# Patient Record
Sex: Female | Born: 1950 | Race: Black or African American | Hispanic: No | Marital: Single | Smoking: Never smoker
Health system: Southern US, Community
[De-identification: ages and names within clinical notes are randomized; demographics above are authoritative.]

## PROBLEM LIST (undated history)

## (undated) DIAGNOSIS — C801 Malignant (primary) neoplasm, unspecified: Secondary | ICD-10-CM

## (undated) DIAGNOSIS — I10 Essential (primary) hypertension: Secondary | ICD-10-CM

## (undated) DIAGNOSIS — Z923 Personal history of irradiation: Secondary | ICD-10-CM

## (undated) DIAGNOSIS — L309 Dermatitis, unspecified: Secondary | ICD-10-CM

## (undated) DIAGNOSIS — R627 Adult failure to thrive: Secondary | ICD-10-CM

## (undated) DIAGNOSIS — D500 Iron deficiency anemia secondary to blood loss (chronic): Secondary | ICD-10-CM

## (undated) DIAGNOSIS — R011 Cardiac murmur, unspecified: Secondary | ICD-10-CM

## (undated) DIAGNOSIS — E039 Hypothyroidism, unspecified: Secondary | ICD-10-CM

## (undated) DIAGNOSIS — C3491 Malignant neoplasm of unspecified part of right bronchus or lung: Principal | ICD-10-CM

## (undated) DIAGNOSIS — T8859XA Other complications of anesthesia, initial encounter: Secondary | ICD-10-CM

## (undated) DIAGNOSIS — T4145XA Adverse effect of unspecified anesthetic, initial encounter: Secondary | ICD-10-CM

## (undated) DIAGNOSIS — E119 Type 2 diabetes mellitus without complications: Secondary | ICD-10-CM

## (undated) DIAGNOSIS — Z5111 Encounter for antineoplastic chemotherapy: Secondary | ICD-10-CM

## (undated) HISTORY — DX: Type 2 diabetes mellitus without complications: E11.9

## (undated) HISTORY — DX: Personal history of irradiation: Z92.3

## (undated) HISTORY — PX: TONSILLECTOMY: SUR1361

## (undated) HISTORY — DX: Essential (primary) hypertension: I10

## (undated) HISTORY — DX: Encounter for antineoplastic chemotherapy: Z51.11

## (undated) HISTORY — PX: ABDOMINAL HYSTERECTOMY: SHX81

## (undated) HISTORY — DX: Iron deficiency anemia secondary to blood loss (chronic): D50.0

## (undated) HISTORY — DX: Malignant neoplasm of unspecified part of right bronchus or lung: C34.91

## (undated) HISTORY — DX: Hypothyroidism, unspecified: E03.9

## (undated) HISTORY — DX: Malignant (primary) neoplasm, unspecified: C80.1

---

## 1994-02-26 HISTORY — PX: OOPHORECTOMY: SHX86

## 2015-10-10 ENCOUNTER — Emergency Department (HOSPITAL_BASED_OUTPATIENT_CLINIC_OR_DEPARTMENT_OTHER)
Admission: EM | Admit: 2015-10-10 | Discharge: 2015-10-10 | Disposition: A | Discharge status: Home / Self Care | Payer: Self-pay | Attending: Emergency Medicine | Admitting: Emergency Medicine

## 2015-10-10 ENCOUNTER — Emergency Department (HOSPITAL_BASED_OUTPATIENT_CLINIC_OR_DEPARTMENT_OTHER): Payer: Self-pay

## 2015-10-10 ENCOUNTER — Encounter (HOSPITAL_BASED_OUTPATIENT_CLINIC_OR_DEPARTMENT_OTHER): Payer: Self-pay | Admitting: Emergency Medicine

## 2015-10-10 DIAGNOSIS — J189 Pneumonia, unspecified organism: Secondary | ICD-10-CM

## 2015-10-10 DIAGNOSIS — F17200 Nicotine dependence, unspecified, uncomplicated: Secondary | ICD-10-CM | POA: Insufficient documentation

## 2015-10-10 DIAGNOSIS — R918 Other nonspecific abnormal finding of lung field: Secondary | ICD-10-CM

## 2015-10-10 DIAGNOSIS — Z7982 Long term (current) use of aspirin: Secondary | ICD-10-CM | POA: Insufficient documentation

## 2015-10-10 LAB — CBC WITH DIFFERENTIAL/PLATELET
BASOS ABS: 0 10*3/uL (ref 0.0–0.1)
Basophils Relative: 0 %
EOS PCT: 3 %
Eosinophils Absolute: 0.3 10*3/uL (ref 0.0–0.7)
HEMATOCRIT: 35.9 % — AB (ref 36.0–46.0)
HEMOGLOBIN: 13 g/dL (ref 12.0–15.0)
LYMPHS ABS: 2 10*3/uL (ref 0.7–4.0)
LYMPHS PCT: 21 %
MCH: 29.3 pg (ref 26.0–34.0)
MCHC: 36.2 g/dL — ABNORMAL HIGH (ref 30.0–36.0)
MCV: 81 fL (ref 78.0–100.0)
Monocytes Absolute: 0.6 10*3/uL (ref 0.1–1.0)
Monocytes Relative: 6 %
NEUTROS ABS: 6.7 10*3/uL (ref 1.7–7.7)
Neutrophils Relative %: 70 %
Platelets: 285 10*3/uL (ref 150–400)
RBC: 4.43 MIL/uL (ref 3.87–5.11)
RDW: 13.4 % (ref 11.5–15.5)
WBC: 9.6 10*3/uL (ref 4.0–10.5)

## 2015-10-10 LAB — COMPREHENSIVE METABOLIC PANEL
ALBUMIN: 4.1 g/dL (ref 3.5–5.0)
ALT: 15 U/L (ref 14–54)
ANION GAP: 9 (ref 5–15)
AST: 26 U/L (ref 15–41)
Alkaline Phosphatase: 189 U/L — ABNORMAL HIGH (ref 38–126)
BILIRUBIN TOTAL: 0.6 mg/dL (ref 0.3–1.2)
BUN: 11 mg/dL (ref 6–20)
CHLORIDE: 99 mmol/L — AB (ref 101–111)
CO2: 27 mmol/L (ref 22–32)
Calcium: 9.6 mg/dL (ref 8.9–10.3)
Creatinine, Ser: 1.05 mg/dL — ABNORMAL HIGH (ref 0.44–1.00)
GFR calc Af Amer: >60 mL/min (ref >60)
GFR, EST NON AFRICAN AMERICAN: 55 mL/min — AB (ref >60)
GLUCOSE: 93 mg/dL (ref 65–99)
POTASSIUM: 4.1 mmol/L (ref 3.5–5.1)
Sodium: 135 mmol/L (ref 135–145)
TOTAL PROTEIN: 9.2 g/dL — AB (ref 6.5–8.1)

## 2015-10-10 MED ORDER — BENZONATATE 100 MG PO CAPS: 100.0000 mg | ORAL_CAPSULE | Freq: Three times a day (TID) | ORAL | 0 refills | Status: DC

## 2015-10-10 MED ORDER — SODIUM CHLORIDE 0.9 % IV BOLUS (SEPSIS)
500.0000 mL | Freq: Once | INTRAVENOUS | Status: AC
  Administered 2015-10-10: 500 mL via INTRAVENOUS

## 2015-10-10 MED ORDER — LEVOFLOXACIN IN D5W 500 MG/100ML IV SOLN
500.0000 mg | Freq: Once | INTRAVENOUS | Status: AC
  Administered 2015-10-10: 500 mg via INTRAVENOUS
  Filled 2015-10-10: qty 100

## 2015-10-10 MED ORDER — IOPAMIDOL (ISOVUE-300) INJECTION 61%
100.0000 mL | Freq: Once | INTRAVENOUS | Status: AC | PRN
  Administered 2015-10-10: 80 mL via INTRAVENOUS

## 2015-10-10 MED ORDER — LEVOFLOXACIN 500 MG PO TABS: 500.0000 mg | ORAL_TABLET | Freq: Every day | ORAL | 0 refills | Status: DC

## 2015-10-10 NOTE — ED Notes (Signed)
Patient transported to CT 

## 2015-10-10 NOTE — Discharge Instructions (Signed)
Follow-up with oncology to arrange further testing on not found the lungs. Take antibiotics as prescribed. Stop smoking. Return immediately for worsening shortness of breath, fever, coughing up blood or for any concerns.

## 2015-10-10 NOTE — ED Triage Notes (Signed)
Pt states she has had a cold x 1 week and it's going into her neck and chest.

## 2015-10-10 NOTE — ED Provider Notes (Signed)
[facility] Provider Note   CSN: [id number] Arrival date & time: 10/10/15  1802  By signing my name below, I, Shanna Cisco, attest that this documentation has been prepared under the direction and in the presence of Julianne Rice, MD. Electronically Signed: Shanna Cisco, ED Scribe. 10/10/15. 7:21 PM.    History   Chief Complaint Chief Complaint  Patient presents with  . URI    The history is provided by the patient. No language interpreter was used.   HPI Comments:  Brittany Mercado is a 65 y.o. female who presents to the Emergency Department complaining of gradually worsening symptoms of cold, which started one week ago. Associated symptoms per pt include productive coughOf yellow and green sputum, SOB upon exertion, abdominal, neck and shoulder pain, chills and diaphoresis. Pt hasn't tried taking OTC medication for relief and hasn't seen a PCP. Denies fever. Pt is a tobacco user and started 20 years ago. No new lower extremity swelling or pain.   History reviewed. No pertinent past medical history.  There are no active problems to display for this patient.   Past Surgical History:  Procedure Laterality Date  . ABDOMINAL HYSTERECTOMY      OB History    No data available       Home Medications    Prior to Admission medications   Medication Sig Start Date End Date Taking? Authorizing Provider  aspirin 325 MG tablet Take 325 mg by mouth daily.   Yes Historical Provider, MD  benzonatate (TESSALON) 100 MG capsule Take 1 capsule (100 mg total) by mouth every 8 (eight) hours. 10/10/15   Julianne Rice, MD  levofloxacin (LEVAQUIN) 500 MG tablet Take 1 tablet (500 mg total) by mouth daily. 10/10/15   Julianne Rice, MD    Family History History reviewed. No pertinent family history.  Social History Social History  Substance Use Topics  . Smoking status: Current Every Day Smoker  . Smokeless tobacco: Never Used  . Alcohol use No     Allergies    Penicillins   Review of Systems Review of Systems  Constitutional: Positive for chills, fatigue and fever.  HENT: Positive for congestion. Negative for sinus pressure and sore throat.   Respiratory: Positive for cough and shortness of breath. Negative for chest tightness and wheezing.   Cardiovascular: Negative for chest pain, palpitations and leg swelling.  Gastrointestinal: Negative for abdominal pain, diarrhea, nausea and vomiting.  Genitourinary: Negative for dysuria, flank pain and frequency.  Musculoskeletal: Negative for arthralgias, back pain, myalgias, neck pain and neck stiffness.  Skin: Negative for rash and wound.  Neurological: Negative for dizziness, syncope, weakness, light-headedness, numbness and headaches.  All other systems reviewed and are negative.    Physical Exam Updated Vital Signs BP 127/58 (BP Location: Right Arm)   Pulse 70   Temp 98.2 F (36.8 C) (Oral)   Resp 18   Ht '5\' 3"'$  (1.6 m)   Wt 115 lb (52.2 kg)   SpO2 99%   BMI 20.37 kg/m   Physical Exam  Constitutional: She is oriented to person, place, and time. She appears well-developed and well-nourished.  HENT:  Head: Normocephalic and atraumatic.  Mouth/Throat: Oropharynx is clear and moist.  Eyes: EOM are normal. Pupils are equal, round, and reactive to light.  Neck: Normal range of motion. Neck supple.  Cardiovascular: Normal rate and regular rhythm.  Exam reveals no gallop and no friction rub.   No murmur heard. Pulmonary/Chest: Effort normal. She has rales (Few scattered Rales).  Abdominal: Soft. Bowel sounds are normal. There is no tenderness. There is no rebound and no guarding.  Musculoskeletal: Normal range of motion. She exhibits no edema or tenderness.  No lower extremity swelling, asymmetry or tenderness. Distal pulses are equal and 2+.  Neurological: She is alert and oriented to person, place, and time.  Moves all extremities without deficit. Sensation is fully intact.  Skin: Skin  is warm and dry. No rash noted. No erythema.  Psychiatric: She has a normal mood and affect. Her behavior is normal.  Nursing note and vitals reviewed.    ED Treatments / Results  DIAGNOSTIC STUDIES:  Oxygen Saturation is 100% on room air, normal by my interpretation.    COORDINATION OF CARE:  7:19 PM Discussed treatment plan with pt at bedside, which includes CAT scan of chest, blood work and antibiotics, and pt agreed to plan. Advised to established care with PCP.  Labs (all labs ordered are listed, but only abnormal results are displayed) Labs Reviewed  COMPREHENSIVE METABOLIC PANEL - Abnormal; Notable for the following:       Result Value   Chloride 99 (*)    Creatinine, Ser 1.05 (*)    Total Protein 9.2 (*)    Alkaline Phosphatase 189 (*)    GFR calc non Af Amer 55 (*)    All other components within normal limits  CBC WITH DIFFERENTIAL/PLATELET - Abnormal; Notable for the following:    HCT 35.9 (*)    MCHC 36.2 (*)    All other components within normal limits    EKG  EKG Interpretation None       Radiology Dg Chest 2 View  Result Date: 10/10/2015 CLINICAL DATA:  Chest pain.  Cold and cough for 2 weeks. EXAM: CHEST  2 VIEW COMPARISON:  None. FINDINGS: Normal cardiomediastinal silhouette. Calcified thoracic aorta. Asymmetric opacity RIGHT midlung zone, consistent with early or resolving infiltrate. This abnormality is difficult to visualize on the lateral view, and overlies the central hilar structures most likely. No effusion or pneumothorax. No osseous findings. IMPRESSION: Asymmetric opacity RIGHT midlung zone consistent with early or resolving pneumonia. However, in this patient who is a current everyday smoker, continued surveillance is warranted to exclude a mass. Recommend follow-up radiographs as dictated by clinical improvement until the abnormality clears, no longer than 3-4 weeks. Electronically Signed   By: Staci Righter M.D.   On: 10/10/2015 19:07   Ct  Chest W Contrast  Result Date: 10/10/2015 CLINICAL DATA:  Cough and cold symptoms for 2 weeks. Chest pain and RIGHT-sided back pain. EXAM: CT CHEST WITH CONTRAST TECHNIQUE: Multidetector CT imaging of the chest was performed during intravenous contrast administration. CONTRAST:  77m ISOVUE-300 IOPAMIDOL (ISOVUE-300) INJECTION 61% COMPARISON:  Chest radiograph earlier today. FINDINGS: Cardiovascular: Normal heart size. Three vessel coronary artery calcification. 3 vessel branching pattern from normal sized aortic arch. Unremarkable pulmonary arteries. Mediastinum/Nodes: RIGHT hilar adenopathy, 14 mm lesion as seen on image 67 series 2. Lungs/Pleura: 15 x 24 x 27 mm, superior segment, RIGHT lower lobe mass abuts the posterior pleura, centrally necrotic, slight pleural reaction. No osseous destruction. Neoplasm is likely. 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 Upper Abdomen: Unremarkable Musculoskeletal: No osseous lesions are apparent. IMPRESSION: Superior segment RIGHT lower lobe mass 15 x 24 x 27 mm with adjacent pathologic RIGHT hilar adenopathy. Findings are strongly suspicious for carcinoma of the lung. Tissue sampling is warranted. COPD. Electronically Signed   By: JStaci RighterM.D.   On: 10/10/2015 21:27  Procedures Procedures (including critical care time)  Medications Ordered in ED Medications  sodium chloride 0.9 % bolus 500 mL (0 mLs Intravenous Stopped 10/10/15 2118)  levofloxacin (LEVAQUIN) IVPB 500 mg (0 mg Intravenous Stopped 10/10/15 2118)  iopamidol (ISOVUE-300) 61 % injection 100 mL (80 mLs Intravenous Contrast Given 10/10/15 2036)     Initial Impression / Assessment and Plan / ED Course  I have reviewed the triage vital signs and the nursing notes.  Pertinent labs & imaging results that were available during my care of the patient were reviewed by me and considered in my medical decision making (see chart for details).  Clinical Course    Discuss  CT results with the patient. She understands the gravity of the finding and the need to follow-up with oncology for confirmatory testing. We'll continue antibiotics for possible community acquired pneumonia. She understands she needs to follow-up in the emergency department if she is unable to see the oncologist or her symptoms worsen.  Final Clinical Impressions(s) / ED Diagnoses   Final diagnoses:  CAP (community acquired pneumonia)  Mass of lower lobe of right lung    New Prescriptions New Prescriptions   BENZONATATE (TESSALON) 100 MG CAPSULE    Take 1 capsule (100 mg total) by mouth every 8 (eight) hours.   LEVOFLOXACIN (LEVAQUIN) 500 MG TABLET    Take 1 tablet (500 mg total) by mouth daily.  I personally performed the services described in this documentation, which was scribed in my presence. The recorded information has been reviewed and is accurate.       Julianne Rice, MD 10/10/15 2214

## 2015-10-11 ENCOUNTER — Encounter: Payer: Self-pay | Admitting: *Deleted

## 2015-10-11 ENCOUNTER — Telehealth: Payer: Self-pay | Admitting: *Deleted

## 2015-10-11 DIAGNOSIS — R918 Other nonspecific abnormal finding of lung field: Secondary | ICD-10-CM

## 2015-10-11 NOTE — Progress Notes (Signed)
Oncology Nurse Navigator Documentation  Oncology Nurse Navigator Flowsheets 10/11/2015  Navigator Encounter Type Other/I received a referral on Brittany Mercado today.  I notified new nurse patient coordinator to set up appt with Dr. [name] on 10/19/15 with labs before.  Lab order placed.   Abnormal Finding Date 10/10/2015  Treatment Phase Pre-Tx/Tx Discussion  Barriers/Navigation Needs Coordination of Care  Interventions Coordination of Care  Coordination of Care Appts  Acuity Level 1  Time Spent with Patient 30

## 2015-10-11 NOTE — Telephone Encounter (Signed)
TC to patient. Scheduled appt with lab and MM per Hinton Dyer. Pt aware of appt. Concerned for lack of insurance. Pt would like consult with financial advisor on 8/23 with in clinic.

## 2015-10-19 ENCOUNTER — Ambulatory Visit (HOSPITAL_BASED_OUTPATIENT_CLINIC_OR_DEPARTMENT_OTHER): Payer: Self-pay | Admitting: Internal Medicine

## 2015-10-19 ENCOUNTER — Encounter: Payer: Self-pay | Admitting: Internal Medicine

## 2015-10-19 ENCOUNTER — Other Ambulatory Visit (HOSPITAL_BASED_OUTPATIENT_CLINIC_OR_DEPARTMENT_OTHER): Payer: Self-pay

## 2015-10-19 VITALS — BP 147/57 | HR 83 | Temp 98.0°F | Resp 18 | Ht 63.0 in | Wt 112.9 lb

## 2015-10-19 DIAGNOSIS — R918 Other nonspecific abnormal finding of lung field: Secondary | ICD-10-CM

## 2015-10-19 LAB — COMPREHENSIVE METABOLIC PANEL
ALT: 9 U/L (ref 0–55)
ANION GAP: 9 meq/L (ref 3–11)
AST: 25 U/L (ref 5–34)
Albumin: 3.4 g/dL — ABNORMAL LOW (ref 3.5–5.0)
Alkaline Phosphatase: 198 U/L — ABNORMAL HIGH (ref 40–150)
BUN: 8.2 mg/dL (ref 7.0–26.0)
CHLORIDE: 105 meq/L (ref 98–109)
CO2: 27 meq/L (ref 22–29)
CREATININE: 1.1 mg/dL (ref 0.6–1.1)
Calcium: 9.5 mg/dL (ref 8.4–10.4)
EGFR: 65 mL/min/{1.73_m2} — AB (ref >90)
GLUCOSE: 90 mg/dL (ref 70–140)
Potassium: 3.9 mEq/L (ref 3.5–5.1)
SODIUM: 141 meq/L (ref 136–145)
TOTAL PROTEIN: 8.4 g/dL — AB (ref 6.4–8.3)

## 2015-10-19 LAB — CBC WITH DIFFERENTIAL/PLATELET
BASO%: 0.2 % (ref 0.0–2.0)
Basophils Absolute: 0 10*3/uL (ref 0.0–0.1)
EOS ABS: 0.4 10*3/uL (ref 0.0–0.5)
EOS%: 4.3 % (ref 0.0–7.0)
HEMATOCRIT: 34.4 % — AB (ref 34.8–46.6)
HEMOGLOBIN: 12.2 g/dL (ref 11.6–15.9)
LYMPH#: 2.9 10*3/uL (ref 0.9–3.3)
LYMPH%: 30.8 % (ref 14.0–49.7)
MCH: 28.9 pg (ref 25.1–34.0)
MCHC: 35.5 g/dL (ref 31.5–36.0)
MCV: 81.5 fL (ref 79.5–101.0)
MONO#: 0.6 10*3/uL (ref 0.1–0.9)
MONO%: 5.9 % (ref 0.0–14.0)
NEUT%: 58.8 % (ref 38.4–76.8)
NEUTROS ABS: 5.4 10*3/uL (ref 1.5–6.5)
PLATELETS: 246 10*3/uL (ref 145–400)
RBC: 4.22 10*6/uL (ref 3.70–5.45)
RDW: 13.9 % (ref 11.2–14.5)
WBC: 9.3 10*3/uL (ref 3.9–10.3)

## 2015-10-19 NOTE — Progress Notes (Signed)
[facility] [phone]   [phone]  CONSULT NOTE  REFERRING PHYSICIAN: Dr. Julianne Rice  REASON FOR CONSULTATION:  65 years old African-American female with questionable lung cancer.  HPI Brittany Mercado is a 64 y.o. female with no significant past medical history but has long history of smoking. The patient presented to the emergency department at the [facility] complaining of chest congestion as well as cough in addition to left and right sided chest pain 2 weeks' duration. She had chest x-ray performed on 10/10/2015 and it showed isometric opacity in the right midlung zone consistent with early or resolving pneumonia. This was followed by CT scan of the chest with contrast on the same day. It showed 1.5 x 2.4 x 2.7 cm superior segment right lower lobe mass abuts the posterior pleura, centrally necrotic. There was also right hilar adenopathy measuring 1.4 cm. The patient was referred to me today for further evaluation and recommendation regarding this abnormality. When seen today she is feeling fine with no specific complaints except for mild cough and shortness of breath with exertion but no hemoptysis. She has occasional right-sided chest pain. She denied having any significant weight loss or night sweats. The patient denied having any nausea, vomiting, diarrhea or constipation. She has intermittent headache but no visual changes. Family history significant for mother died from heart disease and Alzheimer. Father had rheumatoid arthritis. No family history of malignancy. The patient is single and has 3 sons. She was accompanied by her sister Vira Agar. She is currently retired and used to work in [employer] (medical). She has a history of smoking less than one pack per day for around 45 years and unfortunately she continues to smoke a few cigarettes every day. She has no history of alcohol or drug abuse.  HPI  History reviewed. No pertinent past medical  history.  Past Surgical History:  Procedure Laterality Date  . ABDOMINAL HYSTERECTOMY      History reviewed. No pertinent family history.  Social History Social History  Substance Use Topics  . Smoking status: Current Every Day Smoker    Packs/day: 0.25    Years: 40.00  . Smokeless tobacco: Never Used  . Alcohol use No    Allergies  Allergen Reactions  . Penicillins Swelling    Current Outpatient Prescriptions  Medication Sig Dispense Refill  . aspirin 325 MG tablet Take 325 mg by mouth daily.    . benzonatate (TESSALON) 100 MG capsule Take 1 capsule (100 mg total) by mouth every 8 (eight) hours. 21 capsule 0  . levofloxacin (LEVAQUIN) 500 MG tablet Take 1 tablet (500 mg total) by mouth daily. 7 tablet 0   No current facility-administered medications for this visit.     Review of Systems  Constitutional: negative Eyes: negative Ears, nose, mouth, throat, and face: negative Respiratory: positive for cough and dyspnea on exertion Cardiovascular: negative Gastrointestinal: negative Genitourinary:negative Integument/breast: negative Hematologic/lymphatic: negative Musculoskeletal:negative Neurological: negative Behavioral/Psych: negative Endocrine: negative Allergic/Immunologic: negative  Physical Exam  XBM:WUXLK, healthy, no distress, well nourished and well developed SKIN: skin color, texture, turgor are normal, no rashes or significant lesions HEAD: Normocephalic, No masses, lesions, tenderness or abnormalities EYES: normal, PERRLA EARS: External ears normal, Canals clear OROPHARYNX:no exudate, no erythema and lips, buccal mucosa, and tongue normal  NECK: supple, no adenopathy, no JVD LYMPH:  no palpable lymphadenopathy, no hepatosplenomegaly BREAST:not examined LUNGS: clear to auscultation , and palpation HEART: regular rate & rhythm, no murmurs and no gallops ABDOMEN:abdomen  soft, non-tender, normal bowel sounds and no masses or organomegaly BACK: Back  symmetric, no curvature., No CVA tenderness EXTREMITIES:no joint deformities, effusion, or inflammation, no edema, no skin discoloration  NEURO: alert & oriented x 3 with fluent speech, no focal motor/sensory deficits  PERFORMANCE STATUS: ECOG 1  LABORATORY DATA: Lab Results  Component Value Date   WBC 9.3 10/19/2015   HGB 12.2 10/19/2015   HCT 34.4 (L) 10/19/2015   MCV 81.5 10/19/2015   PLT 246 10/19/2015      Chemistry      Component Value Date/Time   NA 141 10/19/2015 1337   K 3.9 10/19/2015 1337   CL 99 (L) 10/10/2015 1950   CO2 27 10/19/2015 1337   BUN 8.2 10/19/2015 1337   CREATININE 1.1 10/19/2015 1337      Component Value Date/Time   CALCIUM 9.5 10/19/2015 1337   ALKPHOS 198 (H) 10/19/2015 1337   AST 25 10/19/2015 1337   ALT 9 10/19/2015 1337   BILITOT <0.30 10/19/2015 1337       RADIOGRAPHIC STUDIES: Dg Chest 2 View  Result Date: 10/10/2015 CLINICAL DATA:  Chest pain.  Cold and cough for 2 weeks. EXAM: CHEST  2 VIEW COMPARISON:  None. FINDINGS: Normal cardiomediastinal silhouette. Calcified thoracic aorta. Asymmetric opacity RIGHT midlung zone, consistent with early or resolving infiltrate. This abnormality is difficult to visualize on the lateral view, and overlies the central hilar structures most likely. No effusion or pneumothorax. No osseous findings. IMPRESSION: Asymmetric opacity RIGHT midlung zone consistent with early or resolving pneumonia. However, in this patient who is a current everyday smoker, continued surveillance is warranted to exclude a mass. Recommend follow-up radiographs as dictated by clinical improvement until the abnormality clears, no longer than 3-4 weeks. Electronically Signed   By: Staci Righter M.D.   On: 10/10/2015 19:07   Ct Chest W Contrast  Result Date: 10/10/2015 CLINICAL DATA:  Cough and cold symptoms for 2 weeks. Chest pain and RIGHT-sided back pain. EXAM: CT CHEST WITH CONTRAST TECHNIQUE: Multidetector CT imaging of the chest  was performed during intravenous contrast administration. CONTRAST:  34m ISOVUE-300 IOPAMIDOL (ISOVUE-300) INJECTION 61% COMPARISON:  Chest radiograph earlier today. FINDINGS: Cardiovascular: Normal heart size. Three vessel coronary artery calcification. 3 vessel branching pattern from normal sized aortic arch. Unremarkable pulmonary arteries. Mediastinum/Nodes: RIGHT hilar adenopathy, 14 mm lesion as seen on image 67 series 2. Lungs/Pleura: 15 x 24 x 27 mm, superior segment, RIGHT lower lobe mass abuts the posterior pleura, centrally necrotic, slight pleural reaction. No osseous destruction. Neoplasm is likely. 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 Upper Abdomen: Unremarkable Musculoskeletal: No osseous lesions are apparent. IMPRESSION: Superior segment RIGHT lower lobe mass 15 x 24 x 27 mm with adjacent pathologic RIGHT hilar adenopathy. Findings are strongly suspicious for carcinoma of the lung. Tissue sampling is warranted. COPD. Electronically Signed   By: JStaci RighterM.D.   On: 10/10/2015 21:27    ASSESSMENT: This is a very pleasant 65years old African-American female was suspicious stage IIA (T1b, N1, M0) non-small cell lung cancer pending staging workup and tissue diagnosis. It presented with right lower lobe lung nodule in addition to right hilar adenopathy.   PLAN: I had a lengthy discussion with the patient and her sister about her current disease condition and further investigation to confirm her diagnosis. I recommended for the patient to have a PET scan in addition to MRI of the brain to complete the staging workup of her disease. I will also  arrange for the patient to come back for follow-up visit in 2 week to the multidisciplinary thoracic oncology clinic for evaluation by thoracic surgery for consideration of tissue diagnosis or resection if the patient is a good surgical candidate. For smoke cessation, I strongly encouraged the patient to quit smoking and  offered her smoke cessation program. The patient was advised to call immediately if she has any concerning symptoms in the interval. The patient voices understanding of current disease status and treatment options and is in agreement with the current care plan.  All questions were answered. The patient knows to call the clinic with any problems, questions or concerns. We can certainly see the patient much sooner if necessary.  Thank you so much for allowing me to participate in the care of [facility]. I will continue to follow up the patient with you and assist in her care.  I spent 40 minutes counseling the patient face to face. The total time spent in the appointment was 60 minutes.  Disclaimer: This note was dictated with voice recognition software. Similar sounding words can inadvertently be transcribed and may not be corrected upon review.   Natarsha Hurwitz K. October 19, 2015, 2:45 PM

## 2015-10-22 ENCOUNTER — Telehealth: Payer: Self-pay | Admitting: Internal Medicine

## 2015-10-22 NOTE — Telephone Encounter (Signed)
Central radiology will call re scans. Message to Brittany Mercado re f/u in [place] 2 weeks per 8/23 los.

## 2015-10-27 ENCOUNTER — Ambulatory Visit (HOSPITAL_COMMUNITY)
Admission: RE | Admit: 2015-10-27 | Discharge: 2015-10-27 | Disposition: A | Discharge status: Home / Self Care | Payer: Self-pay | Source: Ambulatory Visit | Attending: Internal Medicine | Admitting: Internal Medicine

## 2015-10-27 ENCOUNTER — Ambulatory Visit (HOSPITAL_COMMUNITY): Payer: Self-pay

## 2015-10-27 ENCOUNTER — Encounter (HOSPITAL_COMMUNITY): Payer: Self-pay

## 2015-10-27 DIAGNOSIS — R918 Other nonspecific abnormal finding of lung field: Secondary | ICD-10-CM | POA: Insufficient documentation

## 2015-10-27 MED ORDER — GADOBENATE DIMEGLUMINE 529 MG/ML IV SOLN
10.0000 mL | Freq: Once | INTRAVENOUS | Status: AC | PRN
  Administered 2015-10-27: 10 mL via INTRAVENOUS

## 2015-11-02 ENCOUNTER — Telehealth: Payer: Self-pay | Admitting: *Deleted

## 2015-11-02 DIAGNOSIS — R918 Other nonspecific abnormal finding of lung field: Secondary | ICD-10-CM

## 2015-11-02 NOTE — Telephone Encounter (Signed)
Oncology Nurse Navigator Documentation  Oncology Nurse Navigator Flowsheets 11/02/2015  Navigator Encounter Type Telephone/I called to follow up with Brittany Mercado.  I updated her on appt for [place] on 11/10/15 arrive at 1:30.  She verbalized understanding of appt time and place.   Telephone Outgoing Call  Treatment Phase Pre-Tx/Tx Discussion  Barriers/Navigation Needs Coordination of Care  Interventions Coordination of Care  Coordination of Care Appts  Acuity Level 2  Acuity Level 2 Assistance expediting appointments  Time Spent with Patient 30

## 2015-11-04 ENCOUNTER — Telehealth: Payer: Self-pay | Admitting: *Deleted

## 2015-11-04 NOTE — Telephone Encounter (Signed)
Mailed clinic letter to pt.  

## 2015-11-07 ENCOUNTER — Encounter (HOSPITAL_COMMUNITY)
Admission: RE | Admit: 2015-11-07 | Discharge: 2015-11-07 | Disposition: A | Discharge status: Home / Self Care | Payer: Self-pay | Source: Ambulatory Visit | Attending: Internal Medicine | Admitting: Internal Medicine

## 2015-11-07 DIAGNOSIS — R918 Other nonspecific abnormal finding of lung field: Secondary | ICD-10-CM | POA: Insufficient documentation

## 2015-11-07 LAB — GLUCOSE, CAPILLARY: GLUCOSE-CAPILLARY: 89 mg/dL (ref 65–99)

## 2015-11-07 MED ORDER — FLUDEOXYGLUCOSE F - 18 (FDG) INJECTION
5.5000 | Freq: Once | INTRAVENOUS | Status: AC | PRN
  Administered 2015-11-07: 5.5 via INTRAVENOUS

## 2015-11-10 ENCOUNTER — Ambulatory Visit
Admission: RE | Admit: 2015-11-10 | Discharge: 2015-11-10 | Disposition: A | Discharge status: Home / Self Care | Payer: Self-pay | Source: Ambulatory Visit | Attending: Radiation Oncology | Admitting: Radiation Oncology

## 2015-11-10 ENCOUNTER — Other Ambulatory Visit: Payer: Self-pay | Admitting: Thoracic Surgery (Cardiothoracic Vascular Surgery)

## 2015-11-10 ENCOUNTER — Institutional Professional Consult (permissible substitution) (INDEPENDENT_AMBULATORY_CARE_PROVIDER_SITE_OTHER): Payer: Self-pay | Admitting: Thoracic Surgery (Cardiothoracic Vascular Surgery)

## 2015-11-10 ENCOUNTER — Other Ambulatory Visit: Payer: Self-pay | Admitting: *Deleted

## 2015-11-10 ENCOUNTER — Encounter: Payer: Self-pay | Admitting: Internal Medicine

## 2015-11-10 ENCOUNTER — Encounter: Payer: Self-pay | Admitting: Thoracic Surgery (Cardiothoracic Vascular Surgery)

## 2015-11-10 ENCOUNTER — Encounter: Payer: Self-pay | Admitting: *Deleted

## 2015-11-10 ENCOUNTER — Other Ambulatory Visit (HOSPITAL_BASED_OUTPATIENT_CLINIC_OR_DEPARTMENT_OTHER): Payer: Self-pay

## 2015-11-10 ENCOUNTER — Ambulatory Visit (HOSPITAL_BASED_OUTPATIENT_CLINIC_OR_DEPARTMENT_OTHER): Payer: Self-pay | Admitting: Internal Medicine

## 2015-11-10 ENCOUNTER — Ambulatory Visit: Payer: Self-pay | Attending: Internal Medicine | Admitting: Physical Therapy

## 2015-11-10 DIAGNOSIS — R2681 Unsteadiness on feet: Secondary | ICD-10-CM | POA: Insufficient documentation

## 2015-11-10 DIAGNOSIS — R918 Other nonspecific abnormal finding of lung field: Secondary | ICD-10-CM

## 2015-11-10 DIAGNOSIS — Z720 Tobacco use: Secondary | ICD-10-CM | POA: Insufficient documentation

## 2015-11-10 LAB — CBC WITH DIFFERENTIAL/PLATELET
BASO%: 0.1 % (ref 0.0–2.0)
BASOS ABS: 0 10*3/uL (ref 0.0–0.1)
EOS ABS: 0.4 10*3/uL (ref 0.0–0.5)
EOS%: 5.2 % (ref 0.0–7.0)
HEMATOCRIT: 35.3 % (ref 34.8–46.6)
HGB: 12.4 g/dL (ref 11.6–15.9)
LYMPH#: 2 10*3/uL (ref 0.9–3.3)
LYMPH%: 27.1 % (ref 14.0–49.7)
MCH: 28.7 pg (ref 25.1–34.0)
MCHC: 35.1 g/dL (ref 31.5–36.0)
MCV: 81.7 fL (ref 79.5–101.0)
MONO#: 0.6 10*3/uL (ref 0.1–0.9)
MONO%: 7.6 % (ref 0.0–14.0)
NEUT#: 4.5 10*3/uL (ref 1.5–6.5)
NEUT%: 60 % (ref 38.4–76.8)
PLATELETS: 226 10*3/uL (ref 145–400)
RBC: 4.32 10*6/uL (ref 3.70–5.45)
RDW: 13.5 % (ref 11.2–14.5)
WBC: 7.5 10*3/uL (ref 3.9–10.3)

## 2015-11-10 LAB — COMPREHENSIVE METABOLIC PANEL
ALT: 10 U/L (ref 0–55)
ANION GAP: 8 meq/L (ref 3–11)
AST: 22 U/L (ref 5–34)
Albumin: 3.4 g/dL — ABNORMAL LOW (ref 3.5–5.0)
Alkaline Phosphatase: 204 U/L — ABNORMAL HIGH (ref 40–150)
BUN: 9.6 mg/dL (ref 7.0–26.0)
CALCIUM: 9.4 mg/dL (ref 8.4–10.4)
CHLORIDE: 104 meq/L (ref 98–109)
CO2: 27 meq/L (ref 22–29)
CREATININE: 1.2 mg/dL — AB (ref 0.6–1.1)
EGFR: 54 mL/min/{1.73_m2} — AB (ref >90)
Glucose: 84 mg/dl (ref 70–140)
Potassium: 4.3 mEq/L (ref 3.5–5.1)
Sodium: 139 mEq/L (ref 136–145)
Total Bilirubin: 0.33 mg/dL (ref 0.20–1.20)
Total Protein: 8.5 g/dL — ABNORMAL HIGH (ref 6.4–8.3)

## 2015-11-10 NOTE — Progress Notes (Signed)
[place] Clinical Social Work  Clinical Social Work met with patient/family at [employer] appointment to offer support and assess for psychosocial needs.  The patient was accompanied by her sister, Brittany Mercado.  [name] shared she was very anxious regarding her diagnosis and her main concern at this time was how her family is coping with her cancer diagnosis.  The patient lives in [place].  Her three children live in the area, she has many grandchildren and a large family support network.  The patient is retired and shared she has difficulty paying her bills on a limited income.  CSW encouraged patient to meet with financial counselor and to follow up with CSW once she has confirmed diagnosis.  ONCBCN DISTRESS SCREENING 11/10/2015  Screening Type Initial Screening  Distress experienced in past week (1-10) 10  Practical problem type Housing;Insurance  Family Problem type Children;Other (comment)  Emotional problem type Nervousness/Anxiety;Adjusting to illness  Information Concerns Type Lack of info about diagnosis;Lack of info about treatment  Referral to clinical social work Yes      Clinical Social Work briefly discussed Clinical Social Work role and [employer] support programs/services.  Clinical Social Work encouraged patient to call with any additional questions or concerns.   [name], MSW, LCSW, OSW-C Clinical Social Worker [facility] [phone]

## 2015-11-10 NOTE — Patient Instructions (Signed)
Smoking Cessation, Tips for Success If you are ready to quit smoking, congratulations! You have chosen to help yourself be healthier. Cigarettes bring nicotine, tar, carbon monoxide, and other irritants into your body. Your lungs, heart, and blood vessels will be able to work better without these poisons. There are many different ways to quit smoking. Nicotine gum, nicotine patches, a nicotine inhaler, or nicotine nasal spray can help with physical craving. Hypnosis, support groups, and medicines help break the habit of smoking. WHAT THINGS CAN I DO TO MAKE QUITTING EASIER?  Here are some tips to help you quit for good:  Pick a date when you will quit smoking completely. Tell all of your friends and family about your plan to quit on that date.  Do not try to slowly cut down on the number of cigarettes you are smoking. Pick a quit date and quit smoking completely starting on that day.  Throw away all cigarettes.   Clean and remove all ashtrays from your home, work, and car.  On a card, write down your reasons for quitting. Carry the card with you and read it when you get the urge to smoke.  Cleanse your body of nicotine. Drink enough water and fluids to keep your urine clear or pale yellow. Do this after quitting to flush the nicotine from your body.  Learn to predict your moods. Do not let a bad situation be your excuse to have a cigarette. Some situations in your life might tempt you into wanting a cigarette.  Never have "just one" cigarette. It leads to wanting another and another. Remind yourself of your decision to quit.  Change habits associated with smoking. If you smoked while driving or when feeling stressed, try other activities to replace smoking. Stand up when drinking your coffee. Brush your teeth after eating. Sit in a different chair when you read the paper. Avoid alcohol while trying to quit, and try to drink fewer caffeinated beverages. Alcohol and caffeine may urge you to  smoke.  Avoid foods and drinks that can trigger a desire to smoke, such as sugary or spicy foods and alcohol.  Ask people who smoke not to smoke around you.  Have something planned to do right after eating or having a cup of coffee. For example, plan to take a walk or exercise.  Try a relaxation exercise to calm you down and decrease your stress. Remember, you may be tense and nervous for the first 2 weeks after you quit, but this will pass.  Find new activities to keep your hands busy. Play with a pen, coin, or rubber band. Doodle or draw things on paper.  Brush your teeth right after eating. This will help cut down on the craving for the taste of tobacco after meals. You can also try mouthwash.   Use oral substitutes in place of cigarettes. Try using lemon drops, carrots, cinnamon sticks, or chewing gum. Keep them handy so they are available when you have the urge to smoke.  When you have the urge to smoke, try deep breathing.  Designate your home as a nonsmoking area.  If you are a heavy smoker, ask your health care provider about a prescription for nicotine chewing gum. It can ease your withdrawal from nicotine.  Reward yourself. Set aside the cigarette money you save and buy yourself something nice.  Look for support from others. Join a support group or smoking cessation program. Ask someone at home or at work to help you with your plan   to quit smoking.  Always ask yourself, "Do I need this cigarette or is this just a reflex?" Tell yourself, "Today, I choose not to smoke," or "I do not want to smoke." You are reminding yourself of your decision to quit.  Do not replace cigarette smoking with electronic cigarettes (commonly called e-cigarettes). The safety of e-cigarettes is unknown, and some may contain harmful chemicals.  If you relapse, do not give up! Plan ahead and think about what you will do the next time you get the urge to smoke. HOW WILL I FEEL WHEN I QUIT SMOKING? You  may have symptoms of withdrawal because your body is used to nicotine (the addictive substance in cigarettes). You may crave cigarettes, be irritable, feel very hungry, cough often, get headaches, or have difficulty concentrating. The withdrawal symptoms are only temporary. They are strongest when you first quit but will go away within 10-14 days. When withdrawal symptoms occur, stay in control. Think about your reasons for quitting. Remind yourself that these are signs that your body is healing and getting used to being without cigarettes. Remember that withdrawal symptoms are easier to treat than the major diseases that smoking can cause.  Even after the withdrawal is over, expect periodic urges to smoke. However, these cravings are generally short lived and will go away whether you smoke or not. Do not smoke! WHAT RESOURCES ARE AVAILABLE TO HELP ME QUIT SMOKING? Your health care provider can direct you to community resources or hospitals for support, which may include:  Group support.  Education.  Hypnosis.  Therapy.   This information is not intended to replace advice given to you by your health care provider. Make sure you discuss any questions you have with your health care provider.   Document Released: 11/11/2003 Document Revised: 03/05/2014 Document Reviewed: 07/31/2012 Elsevier Interactive Patient Education 2016 [employer]  

## 2015-11-10 NOTE — Progress Notes (Signed)
[place] [phone]   [phone] Multidisciplinary thoracic oncology clinic  OFFICE PROGRESS NOTE  No primary care provider on file. No primary provider on file.  DIAGNOSIS: Questionable metastatic lung cancer pending tissue diagnosis and staging workup presented with hypermetabolic right lower lobe nodule with metastatic right hilar lymphadenopathy and potentially subcarinal lymphadenopathy as well as suspicious single hypermetabolic focus in the right acetabular region.  PRIOR THERAPY: None  CURRENT THERAPY: None.  INTERVAL HISTORY: Brittany Mercado 65 y.o. female returns to the clinic today for accompanied by her sister and son. The patient is feeling fine today except for anxiety about her recent imaging studies. She denied having any significant chest pain but has shortness breath with exertion with no cough or hemoptysis. She has no nausea, vomiting, diarrhea or constipation. She denied having any significant weight loss or night sweats. She has no fever or chills. She recently underwent a PET scan as well as MRI of the brain and she is here for evaluation and discussion of her scan results.  MEDICAL HISTORY:No past medical history on file.  ALLERGIES:  is allergic to penicillins.  MEDICATIONS:  Current Outpatient Prescriptions  Medication Sig Dispense Refill  . aspirin 325 MG tablet Take 325 mg by mouth daily.    . benzonatate (TESSALON) 100 MG capsule Take 1 capsule (100 mg total) by mouth every 8 (eight) hours. 21 capsule 0  . levofloxacin (LEVAQUIN) 500 MG tablet Take 1 tablet (500 mg total) by mouth daily. 7 tablet 0   No current facility-administered medications for this visit.     SURGICAL HISTORY:  Past Surgical History:  Procedure Laterality Date  . ABDOMINAL HYSTERECTOMY      REVIEW OF SYSTEMS:  Constitutional: positive for fatigue Eyes: negative Ears, nose, mouth, throat, and face: negative Respiratory: positive for  dyspnea on exertion Cardiovascular: negative Gastrointestinal: negative Genitourinary:negative Integument/breast: negative Hematologic/lymphatic: negative Musculoskeletal:negative Neurological: negative Behavioral/Psych: negative Endocrine: negative Allergic/Immunologic: negative   PHYSICAL EXAMINATION: General appearance: alert, cooperative, fatigued and no distress Head: Normocephalic, without obvious abnormality, atraumatic Neck: no adenopathy, no carotid bruit, supple, symmetrical, trachea midline and thyroid not enlarged, symmetric, no tenderness/mass/nodules Lymph nodes: Cervical, supraclavicular, and axillary nodes normal. Resp: clear to auscultation bilaterally Back: symmetric, no curvature. ROM normal. No CVA tenderness. Cardio: regular rate and rhythm, S1, S2 normal, no murmur, click, rub or gallop GI: soft, non-tender; bowel sounds normal; no masses,  no organomegaly Extremities: extremities normal, atraumatic, no cyanosis or edema Neurologic: Alert and oriented X 3, normal strength and tone. Normal symmetric reflexes. Normal coordination and gait  ECOG PERFORMANCE STATUS: 1 - Symptomatic but completely ambulatory  There were no vitals taken for this visit.  LABORATORY DATA: Lab Results  Component Value Date   WBC 7.5 11/10/2015   HGB 12.4 11/10/2015   HCT 35.3 11/10/2015   MCV 81.7 11/10/2015   PLT 226 11/10/2015      Chemistry      Component Value Date/Time   NA 141 10/19/2015 1337   K 3.9 10/19/2015 1337   CL 99 (L) 10/10/2015 1950   CO2 27 10/19/2015 1337   BUN 8.2 10/19/2015 1337   CREATININE 1.1 10/19/2015 1337      Component Value Date/Time   CALCIUM 9.5 10/19/2015 1337   ALKPHOS 198 (H) 10/19/2015 1337   AST 25 10/19/2015 1337   ALT 9 10/19/2015 1337   BILITOT <0.30 10/19/2015 1337       RADIOGRAPHIC STUDIES: Mr Jeri Cos EN  Contrast  Result Date: 10/27/2015 CLINICAL DATA:  65 year old female with superior segment right lower lobe mass and  hilar lymphadenopathy. Staging for presumed lung cancer. Subsequent encounter. EXAM: MRI HEAD WITHOUT AND WITH CONTRAST TECHNIQUE: Multiplanar, multiecho pulse sequences of the brain and surrounding structures were obtained without and with intravenous contrast. CONTRAST:  61m MULTIHANCE GADOBENATE DIMEGLUMINE 529 MG/ML IV SOLN COMPARISON:  CT chest 10/10/2015. FINDINGS: No abnormal enhancement identified. No midline shift, mass effect, or evidence of intracranial mass lesion. No dural thickening. Visualized bone marrow signal is within normal limits. Negative visualized cervical spine and spinal cord. Major intracranial vascular flow voids are within normal limits. No restricted diffusion to suggest acute infarction. Noventriculomegaly, extra-axial collection or acute intracranial hemorrhage. Cervicomedullary junction and pituitary are within normal limits. Scattered small mostly subcortical cerebral white matter foci of T2 and FLAIR hyperintensity, nonspecific. Extent is moderate for age. No cortical encephalomalacia or chronic cerebral blood products. Visible internal auditory structures appear normal. Paranasal sinuses and mastoids are well pneumatized. Mild frontal sinus mucosal thickening. Negative orbit and scalp soft tissues. IMPRESSION: 1.  No metastatic disease or acute intracranial abnormality. 2. Moderate for age nonspecific cerebral white matter signal changes, most commonly due to chronic small vessel disease. Electronically Signed   By: HGenevie AnnM.D.   On: 10/27/2015 14:57   Nm Pet Image Initial (pi) Skull Base To Thigh  Result Date: 11/07/2015 CLINICAL DATA:  Initial treatment strategy for lung mass. EXAM: NUCLEAR MEDICINE PET SKULL BASE TO THIGH TECHNIQUE: 5.5 mCi F-18 FDG was injected intravenously. Full-ring PET imaging was performed from the skull base to thigh after the radiotracer. CT data was obtained and used for attenuation correction and anatomic localization. FASTING BLOOD GLUCOSE:   Value: 89 mg/dl COMPARISON:  CT scan from 10/10/2015. FINDINGS: NECK No hypermetabolic lymph nodes in the neck. CHEST Right lower lobe lesion, measuring 2.7 x 2.4 cm, is markedly hypermetabolic with SUV max = 16. There is hypermetabolic right hilar lymphadenopathy associated with SUV max = 24. 8 mm short axis subcarinal lymph node is not show marked hypermetabolism but has low level FDG uptake take, just above background mediastinal levels with SUV max = 4.1 Moderate to advanced changes of emphysema are noted bilaterally. ABDOMEN/PELVIS No abnormal hypermetabolic activity within the liver, pancreas, adrenal glands, or spleen. No hypermetabolic lymph nodes in the abdomen or pelvis. No evidence for hypermetabolic lymphadenopathy in the abdomen or pelvis. Of particular note, a 1.7 x 2.0 cm polypoid soft tissue lesion in the distal sigmoid colon (see image 147 series 4) is hypermetabolic with SUV max = 15. There is abdominal aortic atherosclerosis without aneurysm. SKELETON A single suspicious hypermetabolic focus is identified within the bony anatomy. This is in the posterior right acetabulum in corresponds to image 161 of series 4. There is no underlying CT abnormality at this level. SUV max = is 5.5. IMPRESSION: 1. Hypermetabolic right lower lobe nodule consistent with malignancy. This is associated with metastatic right hilar lymphadenopathy and potentially metastatic spread to a subcarinal lymph node, although the subcarinal disease is not considered definite. 2. 2.0 cm polypoid lesion in the sigmoid colon is markedly hypermetabolic. Colonic adenoma or adenocarcinoma could have this appearance. Correlation with colorectal cancer screening history recommended and endoscopic evaluation may be indicated. 3. There is a single hypermetabolic focus identified in the bony anatomy, corresponding to the posterior right acetabular region. No underlying abnormality evident on CT. This could potentially be degenerative, but  metastatic involvement is a concern. MRI of the  right hip without and with contrast may prove helpful to further evaluate. 4. Emphysema. 5. Abdominal aortic atherosclerosis. Electronically Signed   By: Misty Stanley M.D.   On: 11/07/2015 12:51    ASSESSMENT AND PLAN: This is a very pleasant 65 years old African-American female with highly suspicious lung cancer that could range from stage IIa with right lower lobe lung nodule and hilar lymphadenopathy up to stage IV if the lesion in the right scapular bone is positive for malignancy. The patient also has questionable lesion in the sigmoid colon concerning for a polyp or colon cancer. MRI of the brain is unremarkable for metastatic brain lesion. I had a lengthy discussion with the patient and her family about her current disease status. I showed them the images of the PET scan. I recommended for the patient to consider MRI of the pelvis for further evaluation of the right acetabular lesion and to rule out metastatic disease to this area. I will also refer the patient to gastroenterology for evaluation of the sigmoid colon hypermetabolic lesion. In the meantime I also recommended for the patient to see Dr. Roxan Hockey later today at the multidisciplinary thoracic oncology clinic for consideration of bronchoscopy, endobronchial ultrasound plus/minus electromagnetic navigational bronchoscopy for tissue diagnosis. Depending on the final staging workup further treatment recommendation will be given for this patient. I would see her back for follow-up visit in 2 weeks for discussion of her treatment options. The patient was seen during the multidisciplinary thoracic oncology clinic today by medical oncology, thoracic surgery, thoracic navigator, social worker and physical therapist. She was advised to call immediately if she has any concerning symptoms in the interval.  The patient voices understanding of current disease status and treatment options and is in  agreement with the current care plan.  All questions were answered. The patient knows to call the clinic with any problems, questions or concerns. We can certainly see the patient much sooner if necessary.  I spent 20 minutes counseling the patient face to face. The total time spent in the appointment was 30 minutes.  Disclaimer: This note was dictated with voice recognition software. Similar sounding words can inadvertently be transcribed and may not be corrected upon review.

## 2015-11-10 NOTE — Progress Notes (Signed)
PCP is No primary care provider on file. Referring Provider is Curt Bears, MD  No chief complaint on file.   HPI: 65 yo woman with a history of tobacco abuse who recently presented with productive cough, fever, chest tightness. In ED CT showed a right lung mass. CT showed a RLL mass abutting the chest wall. Also showed upper lobe predominant emphysema PET showed the mass and hilar nodes were hypermetabolic. There was a questionable subcarinal node. There was activity in the sigmoid colon and right hip.  She is smoking < 1/2 ppd. She denies wheezing. Cough resolved with antibiotics. Has to stop going up one flight of 14 stairs. No weight loss but appetite poor. No HA or visual changes. Denies hip pain.  Zubrod Score: At the time of surgery this patient's most appropriate activity status/level should be described as: _0     0    Normal activity, no symptoms _1     1    Restricted in physical strenuous activity but ambulatory, able to do out light work _2     2    Ambulatory and capable of self care, unable to do work activities, up and about >50 % of waking hours                              _3     3    Only limited self care, in bed greater than 50% of waking hours _4     4    Completely disabled, no self care, confined to bed or chair _5     5    Moribund    Past Medical History:  Diagnosis Date  . COPD (chronic obstructive pulmonary disease) ([place])   . Lung mass   Denies cardiac, renal, GI, hepatic issues, DVT/ PE  Past Surgical History:  Procedure Laterality Date  . ABDOMINAL HYSTERECTOMY    . OOPHORECTOMY  1996   "benign"  . TONSILLECTOMY      Family History  Problem Relation Age of Onset  . Family history unknown: Yes    Social History Social History  Substance Use Topics  . Smoking status: Current Every Day Smoker    Packs/day: 0.75    Years: 40.00  . Smokeless tobacco: Never Used  . Alcohol use No    Current Outpatient Prescriptions  Medication Sig Dispense  Refill  . aspirin 325 MG tablet Take 325 mg by mouth daily.    . Multiple Vitamin (MULTIVITAMIN) tablet Take 1 tablet by mouth daily.     No current facility-administered medications for this visit.     Allergies  Allergen Reactions  . Penicillins Swelling    Review of Systems  Constitutional: Positive for appetite change and fatigue. Negative for fever and unexpected weight change.  HENT: Negative for trouble swallowing and voice change.   Respiratory: Positive for cough and shortness of breath.   Cardiovascular: Negative for chest pain, palpitations and leg swelling.  Gastrointestinal: Negative for abdominal distention, abdominal pain, blood in stool and constipation.  Genitourinary: Negative for difficulty urinating and dysuria.  Musculoskeletal: Negative for arthralgias and joint swelling.  Neurological: Negative for seizures, syncope and weakness.  Hematological: Negative for adenopathy. Does not bruise/bleed easily.  All other systems reviewed and are negative.   There were no vitals taken for this visit. Physical Exam  Constitutional: She is oriented to person, place, and time. She appears well-developed and well-nourished. No distress.  HENT:  Head: Normocephalic and atraumatic.  Mouth/Throat: No oropharyngeal exudate.  Eyes: Conjunctivae and EOM are normal. No scleral icterus.  Neck: Neck supple. No thyromegaly present.  Cardiovascular: Normal rate, regular rhythm, normal heart sounds and intact distal pulses.   No murmur heard. Pulmonary/Chest: Effort normal and breath sounds normal. No respiratory distress. She has no wheezes.  Abdominal: Soft. She exhibits no distension. There is no tenderness.  Musculoskeletal: Normal range of motion. She exhibits no edema.  Lymphadenopathy:    She has no cervical adenopathy.  Neurological: She is alert and oriented to person, place, and time. No cranial nerve deficit. She exhibits normal muscle tone. Coordination normal.  Skin:  Skin is warm and dry.     Diagnostic Tests: NUCLEAR MEDICINE PET SKULL BASE TO THIGH  TECHNIQUE: 5.5 mCi F-18 FDG was injected intravenously. Full-ring PET imaging was performed from the skull base to thigh after the radiotracer. CT data was obtained and used for attenuation correction and anatomic localization.  FASTING BLOOD GLUCOSE:  Value: 89 mg/dl  COMPARISON:  CT scan from 10/10/2015.  FINDINGS: NECK  No hypermetabolic lymph nodes in the neck.  CHEST  Right lower lobe lesion, measuring 2.7 x 2.4 cm, is markedly hypermetabolic with SUV max = 16.  There is hypermetabolic right hilar lymphadenopathy associated with SUV max = 24. 8 mm short axis subcarinal lymph node is not show marked hypermetabolism but has low level FDG uptake take, just above background mediastinal levels with SUV max = 4.1  Moderate to advanced changes of emphysema are noted bilaterally.  ABDOMEN/PELVIS  No abnormal hypermetabolic activity within the liver, pancreas, adrenal glands, or spleen. No hypermetabolic lymph nodes in the abdomen or pelvis.  No evidence for hypermetabolic lymphadenopathy in the abdomen or pelvis.  Of particular note, a 1.7 x 2.0 cm polypoid soft tissue lesion in the distal sigmoid colon (see image 147 series 4) is hypermetabolic with SUV max = 15.  There is abdominal aortic atherosclerosis without aneurysm.  SKELETON  A single suspicious hypermetabolic focus is identified within the bony anatomy. This is in the posterior right acetabulum in corresponds to image 161 of series 4. There is no underlying CT abnormality at this level. SUV max = is 5.5.  IMPRESSION: 1. Hypermetabolic right lower lobe nodule consistent with malignancy. This is associated with metastatic right hilar lymphadenopathy and potentially metastatic spread to a subcarinal lymph node, although the subcarinal disease is not considered definite. 2. 2.0 cm polypoid lesion in  the sigmoid colon is markedly hypermetabolic. Colonic adenoma or adenocarcinoma could have this appearance. Correlation with colorectal cancer screening history recommended and endoscopic evaluation may be indicated. 3. There is a single hypermetabolic focus identified in the bony anatomy, corresponding to the posterior right acetabular region. No underlying abnormality evident on CT. This could potentially be degenerative, but metastatic involvement is a concern. MRI of the right hip without and with contrast may prove helpful to further evaluate. 4. Emphysema. 5. Abdominal aortic atherosclerosis.   Electronically Signed   By: Eric  Mansell M.D.   On: 11/07/2015 12:51 CT CHEST WITH CONTRAST  TECHNIQUE: Multidetector CT imaging of the chest was performed during intravenous contrast administration.  CONTRAST:  80mL ISOVUE-300 IOPAMIDOL (ISOVUE-300) INJECTION 61%  COMPARISON:  Chest radiograph earlier today.  FINDINGS: Cardiovascular: Normal heart size. Three vessel coronary artery calcification. 3 vessel branching pattern from normal sized aortic arch. Unremarkable pulmonary arteries.  Mediastinum/Nodes: RIGHT hilar adenopathy, 14 mm lesion as seen on image 67 series 2.  Lungs/Pleura: 15 x 24 x 27 mm,   superior segment, RIGHT lower lobe mass abuts the posterior pleura, centrally necrotic, slight pleural reaction. No osseous destruction. Neoplasm is likely. 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  Upper Abdomen: Unremarkable  Musculoskeletal: No osseous lesions are apparent.  IMPRESSION: Superior segment RIGHT lower lobe mass 15 x 24 x 27 mm with adjacent pathologic RIGHT hilar adenopathy. Findings are strongly suspicious for carcinoma of the lung. Tissue sampling is warranted.  COPD.   Electronically Signed   By: Staci Righter M.D.   On: 10/10/2015 21:27 I personally reviewed the PET and CT and concur with the findings noted  above  Impression: 65 yo woman with a RLL mass and hilar adenopathy. This is almost certainly non-small cell carcinoma. She needs a tissue diagnosis and staging. She has at least stage II disease but possibly stage III if CW involvement or subcarinal node is positive. I doubt the hip lesion is a met but there is a chance she has stage IV disease  Needs GI eval for colonoscopy and MR of hip  She needs PFTs although I do not think she will be a candidate for resection based on the severity of her emphysematous changes on CT  I recommended she have navigational bronchoscopy and EBUS for diagnostic and staging purposes. I described the procedure to her I detail. She understands this would be done under GA in the operating room as an outpatient procedure. No guarantee of a definitive diagnosis. She understands the risks include those of GA such as MI, stroke, hypoxia. She understands the risks of the procedure itself include but are not limited to bleeding, pneumothorax, failure to make a diagnosis. There is also the possibility of other unforeseeable complications   Will schedule ENB/ EBUS next week.    Melrose Nakayama, MD [facility] [phone]

## 2015-11-10 NOTE — Therapy (Signed)
[place], [place], [address] Phone: [phone]   Fax:  [phone]  Physical Therapy Evaluation  Patient Details  Name: Brittany Mercado MRN: [id number] Date of Birth: [birth year] Referring Provider: Dr. Curt Bears  Encounter Date: 11/10/2015      PT End of Session - 11/10/15 1519    Visit Number 1   Number of Visits 1   PT Start Time 4097   PT Stop Time 1507   PT Time Calculation (min) 23 min   Activity Tolerance Patient tolerated treatment well   Behavior During Therapy [facility] for tasks assessed/performed      No past medical history on file.  Past Surgical History:  Procedure Laterality Date  . ABDOMINAL HYSTERECTOMY      There were no vitals filed for this visit.       Subjective Assessment - 11/10/15 1510    Subjective Stays pretty active playing with her grandchildren   Patient is accompained by: Family member  son and sister   Pertinent History Patient with a 2.3x1.9 cm right infrahilar nodule with hypermetabolic spots in the right posterior acetabulum and distal sigmoid colon. Current plan is to obtain a tissue biopsy. She is a current smoker.   Patient Stated Goals to obtain information from long multidisciplinary clinic providers   Currently in Pain? No/denies            [facility] PT Assessment - 11/10/15 0001      Assessment   Medical Diagnosis right infrahilar mass   Referring Provider Dr. Curt Bears     Balance Screen   Has the patient fallen in the past 6 months No   Has the patient had a decrease in activity level because of a fear of falling?  No   Is the patient reluctant to leave their home because of a fear of falling?  No     Home Environment   Living Environment Private residence   Living Arrangements Alone   Type of Lily to enter   Entrance Stairs-Number of Steps 2 flights     Prior Function   Level of Independence  Independent   Leisure no regular exercise but states she stays active playing with her grandchildren     Cognition   Overall Cognitive Status Within Functional Limits for tasks assessed     Functional Tests   Functional tests Sit to Stand     Sit to Stand   Comments able to perform 12 repetitions during 30 second sit to stand, average for her age     Posture/Postural Control   Posture Comments mildly rounded shoulders and forward head     ROM / Strength   AROM / PROM / Strength AROM     AROM   Overall AROM Comments standing trunk AROM WFL for all directions     Ambulation/Gait   Ambulation/Gait Yes   Ambulation/Gait Assistance 7: Independent     Balance   Balance Assessed Yes     Dynamic Standing Balance   Dynamic Standing - Comments able to reach 12.5 inches during standing Functional Reach, below average for her age                           PT Education - 11/10/15 1517    Education provided Yes   Education Details energy conservation, walking program, Cure article, posture, deep breathing, cough splinting, physical therapy information  Person(s) Educated Patient;Other (comment)  and patient's son and sister   Methods Explanation;Demonstration;Handout   Comprehension Verbalized understanding               Lung Clinic Goals - 11/10/15 1524      Patient will be able to verbalize understanding of the benefit of exercise to decrease fatigue.   Status Achieved     Patient will be able to verbalize the importance of posture.   Status Achieved     Patient will be able to demonstrate diaphragmatic breathing for improved lung function.   Status Achieved     Patient will be able to verbalize understanding of the role of physical therapy to prevent functional decline and who to contact if physical therapy is needed.   Status Achieved             Plan - 11/10/15 1520    Clinical Impression Statement Patient is a 65 year old female with  a right infrahilar nodule and hypermetabolic spots in the right posterior acetabulum and distal sigmoid colon. Patient is independent with all mobility and has good standing trunk AROM. She demonstrates decreased standing balance with the standing Functional Reach. Patient encouraged to begin a walking program and instructed to notify her doctor if she has any needs for physical therapy.   Rehab Potential Good   PT Treatment/Interventions ADLs/Self Care Home Management;Patient/family education   PT Next Visit Plan Currently has no therapy needs   Consulted and Agree with Plan of Care Patient      Patient will benefit from skilled therapeutic intervention in order to improve the following deficits and impairments:  Decreased balance  Visit Diagnosis: Unsteadiness on feet     Problem List Patient Active Problem List   Diagnosis Date Noted  . Tobacco abuse 11/10/2015  . Lung mass 10/11/2015    Mellody Life, SPT 11/10/2015, 3:25 PM  [place] [place], [place], [address] Phone: [phone]   Fax:  [phone]  Name: Brittany Mercado MRN: [id number] Date of Birth: [birth year]  This entire session was guided, instructed, and directly supervised by Serafina Royals, PT.  Read, reviewed, edited and agree with student's findings and recommendations.   Serafina Royals, PT 11/10/15 3:44 PM

## 2015-11-11 ENCOUNTER — Other Ambulatory Visit: Payer: Self-pay | Admitting: *Deleted

## 2015-11-11 ENCOUNTER — Ambulatory Visit (HOSPITAL_BASED_OUTPATIENT_CLINIC_OR_DEPARTMENT_OTHER)
Admission: RE | Admit: 2015-11-11 | Discharge: 2015-11-11 | Disposition: A | Discharge status: Home / Self Care | Payer: Self-pay | Source: Ambulatory Visit | Attending: Thoracic Surgery (Cardiothoracic Vascular Surgery) | Admitting: Thoracic Surgery (Cardiothoracic Vascular Surgery)

## 2015-11-11 DIAGNOSIS — R918 Other nonspecific abnormal finding of lung field: Secondary | ICD-10-CM

## 2015-11-11 DIAGNOSIS — R590 Localized enlarged lymph nodes: Secondary | ICD-10-CM | POA: Insufficient documentation

## 2015-11-11 DIAGNOSIS — J439 Emphysema, unspecified: Secondary | ICD-10-CM | POA: Insufficient documentation

## 2015-11-14 ENCOUNTER — Telehealth: Payer: Self-pay | Admitting: *Deleted

## 2015-11-14 NOTE — Telephone Encounter (Signed)
Oncology Nurse Navigator Documentation  Oncology Nurse Navigator Flowsheets 11/02/2015  Navigator Encounter Type Telephone/I called central scheduling to get an appt for patient MRI pelvis.  I then called patient. I updated her on appt time and place. She verbalized understanding of appt.   Telephone Outgoing Call  Treatment Phase Pre-Tx/Tx Discussion  Barriers/Navigation Needs Coordination of Care  Interventions Coordination of Care  Coordination of Care Appts  Acuity Level 2  Acuity Level 2 Assistance expediting appointments  Time Spent with Patient 30

## 2015-11-15 ENCOUNTER — Encounter (HOSPITAL_COMMUNITY)
Admission: RE | Admit: 2015-11-15 | Discharge: 2015-11-15 | Disposition: A | Discharge status: Home / Self Care | Payer: Self-pay | Source: Ambulatory Visit | Attending: Thoracic Surgery (Cardiothoracic Vascular Surgery) | Admitting: Thoracic Surgery (Cardiothoracic Vascular Surgery)

## 2015-11-15 ENCOUNTER — Ambulatory Visit (HOSPITAL_COMMUNITY)
Admission: RE | Admit: 2015-11-15 | Discharge: 2015-11-15 | Disposition: A | Discharge status: Home / Self Care | Payer: Self-pay | Source: Ambulatory Visit | Attending: Thoracic Surgery (Cardiothoracic Vascular Surgery) | Admitting: Thoracic Surgery (Cardiothoracic Vascular Surgery)

## 2015-11-15 ENCOUNTER — Encounter (HOSPITAL_COMMUNITY): Payer: Self-pay

## 2015-11-15 DIAGNOSIS — R918 Other nonspecific abnormal finding of lung field: Secondary | ICD-10-CM

## 2015-11-15 HISTORY — DX: Cardiac murmur, unspecified: R01.1

## 2015-11-15 HISTORY — DX: Other complications of anesthesia, initial encounter: T88.59XA

## 2015-11-15 HISTORY — DX: Dermatitis, unspecified: L30.9

## 2015-11-15 HISTORY — DX: Adverse effect of unspecified anesthetic, initial encounter: T41.45XA

## 2015-11-15 LAB — COMPREHENSIVE METABOLIC PANEL
ALT: 10 U/L — AB (ref 14–54)
ANION GAP: 9 (ref 5–15)
AST: 27 U/L (ref 15–41)
Albumin: 3.8 g/dL (ref 3.5–5.0)
Alkaline Phosphatase: 187 U/L — ABNORMAL HIGH (ref 38–126)
BUN: 10 mg/dL (ref 6–20)
CHLORIDE: 103 mmol/L (ref 101–111)
CO2: 26 mmol/L (ref 22–32)
Calcium: 9.8 mg/dL (ref 8.9–10.3)
Creatinine, Ser: 1.25 mg/dL — ABNORMAL HIGH (ref 0.44–1.00)
GFR calc non Af Amer: 44 mL/min — ABNORMAL LOW (ref >60)
GFR, EST AFRICAN AMERICAN: 52 mL/min — AB (ref >60)
GLUCOSE: 100 mg/dL — AB (ref 65–99)
Potassium: 4.2 mmol/L (ref 3.5–5.1)
SODIUM: 138 mmol/L (ref 135–145)
Total Bilirubin: 0.7 mg/dL (ref 0.3–1.2)
Total Protein: 8.7 g/dL — ABNORMAL HIGH (ref 6.5–8.1)

## 2015-11-15 LAB — CBC
HCT: 38.1 % (ref 36.0–46.0)
Hemoglobin: 13.1 g/dL (ref 12.0–15.0)
MCH: 28.3 pg (ref 26.0–34.0)
MCHC: 34.4 g/dL (ref 30.0–36.0)
MCV: 82.3 fL (ref 78.0–100.0)
PLATELETS: 264 10*3/uL (ref 150–400)
RBC: 4.63 MIL/uL (ref 3.87–5.11)
RDW: 13.4 % (ref 11.5–15.5)
WBC: 9 10*3/uL (ref 4.0–10.5)

## 2015-11-15 LAB — PROTIME-INR
INR: 1.01
Prothrombin Time: 13.3 seconds (ref 11.4–15.2)

## 2015-11-15 LAB — APTT: aPTT: 34 seconds (ref 24–36)

## 2015-11-15 NOTE — Progress Notes (Signed)
Cardiologist denies  Doesn't have a primary doctor  Denies ever having an echo/stress test/heart cath  EKG denies in past yr

## 2015-11-15 NOTE — Pre-Procedure Instructions (Signed)
Brittany Mercado  11/15/2015      [facility] [phone] - [place], [place] - [address] [place] [address] Phone: [phone] Fax: [phone]    Your procedure is scheduled on Fri, Sept 22 @ 10:55 AM  Report to [facility] Admitting at 8:45 AM  Call this number if you have problems the morning of surgery:  [phone]   Remember:  Do not eat food or drink liquids after midnight.              Stop taking your Aspirin and Multivitamin. No Goody's,BC's,Aleve,Advil,Motrin,Iubprofen,Fish Oil,or any Herbal Medications.     Do not wear jewelry, make-up or nail polish.  Do not wear lotions, powders, perfumes, or deoderant.  Do not shave 48 hours prior to surgery.   Do not bring valuables to the hospital.  [facility] is not responsible for any belongings or valuables.  Contacts, dentures or bridgework may not be worn into surgery.  Leave your suitcase in the car.  After surgery it may be brought to your room.  For patients admitted to the hospital, discharge time will be determined by your treatment team.  Patients discharged the day of surgery will not be allowed to drive home.    [facility]  Before surgery, you can play an important role.  Because skin is not sterile, your skin needs to be as free of germs as possible.  You can reduce the number of germs on you skin by washing with CHG (chlorahexidine gluconate) soap before surgery.  CHG is an antiseptic cleaner which kills germs and bonds with the skin to continue killing germs even after washing.  Please DO NOT use if you have an allergy to CHG or antibacterial soaps.  If your skin becomes reddened/irritated stop using the CHG and inform your nurse when you arrive at Short Stay.  Do not shave (including legs and underarms) for at least 48 hours prior to the first CHG shower.  You may shave your face.  Please  follow these instructions carefully:   1.  Shower with CHG Soap the night before surgery and the                                morning of Surgery.  2.  If you choose to wash your hair, wash your hair first as usual with your       normal shampoo.  3.  After you shampoo, rinse your hair and body thoroughly to remove the                      Shampoo.  4.  Use CHG as you would any other liquid soap.  You can apply chg directly       to the skin and wash gently with scrungie or a clean washcloth.  5.  Apply the CHG Soap to your body ONLY FROM THE NECK DOWN.        Do not use on open wounds or open sores.  Avoid contact with your eyes,       ears, mouth and genitals (private parts).  Wash genitals (private parts)       with your normal soap.  6.  Wash thoroughly, paying special attention to the area where your surgery  will be performed.  7.  Thoroughly rinse your body with warm water from the neck down.  8.  DO NOT shower/wash with your normal soap after using and rinsing off       the CHG Soap.  9.  Pat yourself dry with a clean towel.            10.  Wear clean pajamas.            11.  Place clean sheets on your bed the night of your first shower and do not        sleep with pets.  Day of Surgery  Do not apply any lotions/deoderants the morning of surgery.  Please wear clean clothes to the hospital/surgery center.    Please read over the following fact sheets that you were given. Pain Booklet and Coughing and Deep Breathing

## 2015-11-16 ENCOUNTER — Encounter: Payer: Self-pay | Admitting: Internal Medicine

## 2015-11-16 ENCOUNTER — Ambulatory Visit (HOSPITAL_COMMUNITY)
Admission: RE | Admit: 2015-11-16 | Discharge: 2015-11-16 | Disposition: A | Discharge status: Home / Self Care | Payer: Self-pay | Source: Ambulatory Visit | Attending: Internal Medicine | Admitting: Internal Medicine

## 2015-11-16 DIAGNOSIS — R918 Other nonspecific abnormal finding of lung field: Secondary | ICD-10-CM | POA: Insufficient documentation

## 2015-11-16 DIAGNOSIS — M899 Disorder of bone, unspecified: Secondary | ICD-10-CM | POA: Insufficient documentation

## 2015-11-16 MED ORDER — GADOBENATE DIMEGLUMINE 529 MG/ML IV SOLN
10.0000 mL | Freq: Once | INTRAVENOUS | Status: AC | PRN
  Administered 2015-11-16: 10 mL via INTRAVENOUS

## 2015-11-16 NOTE — Progress Notes (Signed)
Called patient from look-back list to introduce myself as Estate manager/land agent and to discuss financial concerns and insurance. Patient states that she does not have insurance and was told not to worry about that right now and she is bring billed. Asked patient if she has applied for Medicaid. Patient states no she hasn't. Asked patient if she would like for me to mail her a Medicaid application. She states sure. Confirmed mailing address and advised patient I would put application in mail to her as well as my card for any other concerns. Patient verbalized understanding. Placed in outgoing mail.

## 2015-11-17 ENCOUNTER — Ambulatory Visit (HOSPITAL_COMMUNITY)
Admission: RE | Admit: 2015-11-17 | Discharge: 2015-11-17 | Disposition: A | Discharge status: Home / Self Care | Payer: Self-pay | Source: Ambulatory Visit | Attending: Thoracic Surgery (Cardiothoracic Vascular Surgery) | Admitting: Thoracic Surgery (Cardiothoracic Vascular Surgery)

## 2015-11-17 DIAGNOSIS — R918 Other nonspecific abnormal finding of lung field: Secondary | ICD-10-CM | POA: Insufficient documentation

## 2015-11-17 LAB — PULMONARY FUNCTION TEST
DL/VA % PRED: 53 %
DL/VA: 2.53 ml/min/mmHg/L
DLCO unc % pred: 34 %
DLCO unc: 7.8 ml/min/mmHg
FEF 25-75 POST: 1.09 L/s
FEF 25-75 PRE: 1.1 L/s
FEF2575-%CHANGE-POST: 0 %
FEF2575-%PRED-POST: 59 %
FEF2575-%PRED-PRE: 59 %
FEV1-%Change-Post: -1 %
FEV1-%PRED-POST: 86 %
FEV1-%PRED-PRE: 87 %
FEV1-PRE: 1.65 L
FEV1-Post: 1.63 L
FEV1FVC-%CHANGE-POST: 1 %
FEV1FVC-%PRED-PRE: 91 %
FEV6-%CHANGE-POST: -2 %
FEV6-%PRED-POST: 95 %
FEV6-%Pred-Pre: 97 %
FEV6-Post: 2.24 L
FEV6-Pre: 2.29 L
FEV6FVC-%CHANGE-POST: 0 %
FEV6FVC-%PRED-PRE: 104 %
FEV6FVC-%Pred-Post: 104 %
FVC-%CHANGE-POST: -2 %
FVC-%Pred-Post: 92 %
FVC-%Pred-Pre: 94 %
FVC-POST: 2.24 L
FVC-Pre: 2.29 L
POST FEV1/FVC RATIO: 73 %
PRE FEV1/FVC RATIO: 72 %
Post FEV6/FVC ratio: 100 %
Pre FEV6/FVC Ratio: 100 %
RV % pred: 81 %
RV: 1.65 L
TLC % PRED: 79 %
TLC: 3.89 L

## 2015-11-17 MED ORDER — ALBUTEROL SULFATE (2.5 MG/3ML) 0.083% IN NEBU
2.5000 mg | INHALATION_SOLUTION | Freq: Once | RESPIRATORY_TRACT | Status: AC
  Administered 2015-11-17: 2.5 mg via RESPIRATORY_TRACT

## 2015-11-17 NOTE — Progress Notes (Signed)
Anesthesia Chart Review: Patient is a 65 year old female scheduled for video bronchoscopy with endobronchial navigation and endobronchial ultrasound on 11/18/2015 by Dr. Roxan Hockey. She does not have a PCP. On 10/10/2015 she presented to the [place] [facility] with URI symptoms. CXR showed early or resolving RML PNA, but could not rule out underlying mass. Chest CT confirmed a RLL mass with right hilar adenopathy. She was referred to [facility] Dr. Mckinley Jewel and later to CT surgery. Of note PET scan showed hypermetabolic sigmoid colon and right hip lesions. MR of the pelvis done just yesterday and was concerning for a metastatic lesion. She also eventually needs GI evaluation for colonoscopy.    Other history includes murmur (age 63), COPD, hysterectomy tonsillectomy, eczema, hard to wake up after anesthesia. Current smoker.  BP (!) 146/61   Pulse (!) 102   Temp 36.7 C   Resp 20   Ht '5\' 3"'$  (1.6 m)   Wt 110 lb 14.4 oz (50.3 kg)   SpO2 96%   BMI 19.65 kg/m   11/11/15 CT Chest Super D: IMPRESSION: Stable right lower lobe lung lesion and right hilar lymphadenopathy. Severe emphysema.  11/07/15 PET scan: IMPRESSION: 1. Hypermetabolic right lower lobe nodule consistent with malignancy. This is associated with metastatic right hilar lymphadenopathy and potentially metastatic spread to a subcarinal lymph node, although the subcarinal disease is not considered definite. 2. 2.0 cm polypoid lesion in the sigmoid colon is markedly hypermetabolic. Colonic adenoma or adenocarcinoma could have this appearance. Correlation with colorectal cancer screening history recommended and endoscopic evaluation may be indicated. 3. There is a single hypermetabolic focus identified in the bony anatomy, corresponding to the posterior right acetabular region. No underlying abnormality evident on CT. This could potentially be degenerative, but metastatic involvement is a concern. MRI of the right hip without and with  contrast may prove helpful to further evaluate. 4. Emphysema. 5. Abdominal aortic atherosclerosis.  11/17/15 PFTs: FVC 2.29 (94%), FEV1 1.65 (87%), DLCOunc 7.80 (34%).  Preoperative CXR and labs noted. EKG was not done at PAT, but with history of smoking, murmur, COPD, and coronary calcifications on CT will get an EKG on arrival the day of surgery for baseline.  George Hugh [facility] Short Stay Center/Anesthesiology Phone [phone] 11/17/2015 11:48 AM

## 2015-11-18 ENCOUNTER — Ambulatory Visit (HOSPITAL_COMMUNITY): Payer: Self-pay | Admitting: Vascular Surgery

## 2015-11-18 ENCOUNTER — Ambulatory Visit (HOSPITAL_COMMUNITY)
Admission: RE | Admit: 2015-11-18 | Discharge: 2015-11-18 | Disposition: A | Discharge status: Home / Self Care | Payer: Self-pay | Source: Ambulatory Visit | Attending: Thoracic Surgery (Cardiothoracic Vascular Surgery) | Admitting: Thoracic Surgery (Cardiothoracic Vascular Surgery)

## 2015-11-18 ENCOUNTER — Ambulatory Visit (HOSPITAL_COMMUNITY): Payer: Self-pay

## 2015-11-18 ENCOUNTER — Encounter (HOSPITAL_COMMUNITY): Payer: Self-pay | Admitting: Certified Registered Nurse Anesthetist

## 2015-11-18 ENCOUNTER — Encounter (HOSPITAL_COMMUNITY)
Admission: RE | Disposition: A | Discharge status: Home / Self Care | Payer: Self-pay | Source: Ambulatory Visit | Attending: Thoracic Surgery (Cardiothoracic Vascular Surgery)

## 2015-11-18 DIAGNOSIS — Z419 Encounter for procedure for purposes other than remedying health state, unspecified: Secondary | ICD-10-CM

## 2015-11-18 DIAGNOSIS — C3431 Malignant neoplasm of lower lobe, right bronchus or lung: Secondary | ICD-10-CM | POA: Insufficient documentation

## 2015-11-18 DIAGNOSIS — J449 Chronic obstructive pulmonary disease, unspecified: Secondary | ICD-10-CM | POA: Insufficient documentation

## 2015-11-18 DIAGNOSIS — Z9889 Other specified postprocedural states: Secondary | ICD-10-CM

## 2015-11-18 DIAGNOSIS — Z7982 Long term (current) use of aspirin: Secondary | ICD-10-CM | POA: Insufficient documentation

## 2015-11-18 DIAGNOSIS — Z880 Allergy status to penicillin: Secondary | ICD-10-CM | POA: Insufficient documentation

## 2015-11-18 DIAGNOSIS — F17200 Nicotine dependence, unspecified, uncomplicated: Secondary | ICD-10-CM | POA: Insufficient documentation

## 2015-11-18 HISTORY — PX: VIDEO BRONCHOSCOPY WITH ENDOBRONCHIAL NAVIGATION: SHX6175

## 2015-11-18 HISTORY — PX: VIDEO BRONCHOSCOPY WITH ENDOBRONCHIAL ULTRASOUND: SHX6177

## 2015-11-18 SURGERY — BRONCHOSCOPY, WITH EBUS
Anesthesia: General

## 2015-11-18 MED ORDER — PROPOFOL 10 MG/ML IV BOLUS
INTRAVENOUS | Status: DC | PRN
  Administered 2015-11-18: 140 mg via INTRAVENOUS
  Administered 2015-11-18: 50 mg via INTRAVENOUS

## 2015-11-18 MED ORDER — PROPOFOL 10 MG/ML IV BOLUS
INTRAVENOUS | Status: AC
  Filled 2015-11-18: qty 20

## 2015-11-18 MED ORDER — DEXAMETHASONE SODIUM PHOSPHATE 10 MG/ML IJ SOLN
INTRAMUSCULAR | Status: AC
  Filled 2015-11-18: qty 1

## 2015-11-18 MED ORDER — PHENYLEPHRINE 40 MCG/ML (10ML) SYRINGE FOR IV PUSH (FOR BLOOD PRESSURE SUPPORT)
PREFILLED_SYRINGE | INTRAVENOUS | Status: AC
  Filled 2015-11-18: qty 10

## 2015-11-18 MED ORDER — HYDROMORPHONE HCL 1 MG/ML IJ SOLN: 0.2500 mg | INTRAMUSCULAR | Status: DC | PRN

## 2015-11-18 MED ORDER — OXYCODONE HCL 5 MG/5ML PO SOLN: 5.0000 mg | Freq: Once | ORAL | Status: DC | PRN

## 2015-11-18 MED ORDER — ALBUTEROL SULFATE HFA 108 (90 BASE) MCG/ACT IN AERS
INHALATION_SPRAY | RESPIRATORY_TRACT | Status: AC
  Filled 2015-11-18: qty 6.7

## 2015-11-18 MED ORDER — ROCURONIUM BROMIDE 100 MG/10ML IV SOLN
INTRAVENOUS | Status: DC | PRN
  Administered 2015-11-18: 40 mg via INTRAVENOUS

## 2015-11-18 MED ORDER — DEXAMETHASONE SODIUM PHOSPHATE 10 MG/ML IJ SOLN
INTRAMUSCULAR | Status: DC | PRN
  Administered 2015-11-18: 10 mg via INTRAVENOUS

## 2015-11-18 MED ORDER — FENTANYL CITRATE (PF) 100 MCG/2ML IJ SOLN
INTRAMUSCULAR | Status: DC | PRN
  Administered 2015-11-18 (×2): 50 ug via INTRAVENOUS

## 2015-11-18 MED ORDER — ACETAMINOPHEN 325 MG PO TABS
650.0000 mg | ORAL_TABLET | Freq: Once | ORAL | Status: DC
Start: 2015-11-18 — End: 2015-11-18

## 2015-11-18 MED ORDER — ONDANSETRON HCL 4 MG/2ML IJ SOLN: 4.0000 mg | Freq: Once | INTRAMUSCULAR | Status: DC | PRN

## 2015-11-18 MED ORDER — FENTANYL CITRATE (PF) 100 MCG/2ML IJ SOLN
INTRAMUSCULAR | Status: AC
  Filled 2015-11-18: qty 2

## 2015-11-18 MED ORDER — LIDOCAINE HCL (CARDIAC) 20 MG/ML IV SOLN
INTRAVENOUS | Status: DC | PRN
  Administered 2015-11-18: 40 mg via INTRAVENOUS

## 2015-11-18 MED ORDER — SUGAMMADEX SODIUM 200 MG/2ML IV SOLN
INTRAVENOUS | Status: AC
  Filled 2015-11-18: qty 2

## 2015-11-18 MED ORDER — MIDAZOLAM HCL 2 MG/2ML IJ SOLN
INTRAMUSCULAR | Status: AC
  Filled 2015-11-18: qty 2

## 2015-11-18 MED ORDER — MIDAZOLAM HCL 5 MG/5ML IJ SOLN
INTRAMUSCULAR | Status: DC | PRN
  Administered 2015-11-18: 2 mg via INTRAVENOUS

## 2015-11-18 MED ORDER — 0.9 % SODIUM CHLORIDE (POUR BTL) OPTIME
TOPICAL | Status: DC | PRN
  Administered 2015-11-18: 1000 mL

## 2015-11-18 MED ORDER — ALBUTEROL SULFATE HFA 108 (90 BASE) MCG/ACT IN AERS
INHALATION_SPRAY | RESPIRATORY_TRACT | Status: DC | PRN
  Administered 2015-11-18: 4 via RESPIRATORY_TRACT

## 2015-11-18 MED ORDER — ONDANSETRON HCL 4 MG/2ML IJ SOLN
INTRAMUSCULAR | Status: DC | PRN
  Administered 2015-11-18: 4 mg via INTRAVENOUS

## 2015-11-18 MED ORDER — PHENYLEPHRINE HCL 10 MG/ML IJ SOLN
INTRAMUSCULAR | Status: DC | PRN
  Administered 2015-11-18: 20 ug/min via INTRAVENOUS

## 2015-11-18 MED ORDER — OXYCODONE HCL 5 MG PO TABS: 5.0000 mg | ORAL_TABLET | Freq: Once | ORAL | Status: DC | PRN

## 2015-11-18 MED ORDER — ACETAMINOPHEN 325 MG PO TABS
ORAL_TABLET | ORAL | Status: AC
  Filled 2015-11-18: qty 2

## 2015-11-18 MED ORDER — ONDANSETRON HCL 4 MG/2ML IJ SOLN
INTRAMUSCULAR | Status: AC
  Filled 2015-11-18: qty 2

## 2015-11-18 MED ORDER — LACTATED RINGERS IV SOLN
INTRAVENOUS | Status: DC
  Administered 2015-11-18: 50 mL/h via INTRAVENOUS
  Administered 2015-11-18: 10:00:00 via INTRAVENOUS

## 2015-11-18 MED ORDER — SUGAMMADEX SODIUM 200 MG/2ML IV SOLN
INTRAVENOUS | Status: DC | PRN
  Administered 2015-11-18: 100 mg via INTRAVENOUS

## 2015-11-18 SURGICAL SUPPLY — 47 items
ADAPTER BRONCH F/PENTAX (ADAPTER) ×2 IMPLANT
BRUSH BIOPSY BRONCH 10 SDTNB (MISCELLANEOUS) ×2 IMPLANT
BRUSH CYTOL CELLEBRITY 1.5X140 (MISCELLANEOUS) IMPLANT
BRUSH SUPERTRAX BIOPSY (INSTRUMENTS) IMPLANT
BRUSH SUPERTRAX NDL-TIP CYTO (INSTRUMENTS) ×2 IMPLANT
CANISTER SUCTION 2500CC (MISCELLANEOUS) ×4 IMPLANT
CHANNEL WORK EXTEND EDGE 180 (KITS) IMPLANT
CHANNEL WORK EXTEND EDGE 45 (KITS) IMPLANT
CHANNEL WORK EXTEND EDGE 90 (KITS) IMPLANT
CONT SPEC 4OZ CLIKSEAL STRL BL (MISCELLANEOUS) ×8 IMPLANT
COTTONBALL LRG STERILE PKG (GAUZE/BANDAGES/DRESSINGS) IMPLANT
COVER DOME SNAP 22 D (MISCELLANEOUS) ×2 IMPLANT
COVER TABLE BACK 60X90 (DRAPES) ×4 IMPLANT
FILTER STRAW FLUID ASPIR (MISCELLANEOUS) IMPLANT
FORCEPS BIOP RJ4 1.8 (CUTTING FORCEPS) IMPLANT
FORCEPS BIOP SUPERTRX PREMAR (INSTRUMENTS) ×2 IMPLANT
GAUZE SPONGE 4X4 12PLY STRL (GAUZE/BANDAGES/DRESSINGS) ×2 IMPLANT
GLOVE BIO SURGEON STRL SZ 6.5 (GLOVE) ×2 IMPLANT
GLOVE SURG SIGNA 7.5 PF LTX (GLOVE) ×2 IMPLANT
GOWN STRL REUS W/ TWL LRG LVL3 (GOWN DISPOSABLE) ×1 IMPLANT
GOWN STRL REUS W/ TWL XL LVL3 (GOWN DISPOSABLE) ×1 IMPLANT
GOWN STRL REUS W/TWL LRG LVL3 (GOWN DISPOSABLE) ×1
GOWN STRL REUS W/TWL XL LVL3 (GOWN DISPOSABLE) ×1
KIT CLEAN ENDO COMPLIANCE (KITS) ×4 IMPLANT
KIT PROCEDURE EDGE 180 (KITS) IMPLANT
KIT PROCEDURE EDGE 45 (KITS) IMPLANT
KIT PROCEDURE EDGE 90 (KITS) ×2 IMPLANT
KIT ROOM TURNOVER OR (KITS) ×2 IMPLANT
MARKER SKIN DUAL TIP RULER LAB (MISCELLANEOUS) ×2 IMPLANT
NEEDLE 22X1 1/2 (OR ONLY) (NEEDLE) IMPLANT
NEEDLE BIOPSY TRANSBRONCH 21G (NEEDLE) IMPLANT
NEEDLE BLUNT 18X1 FOR OR ONLY (NEEDLE) IMPLANT
NEEDLE EBUS SONO TIP PENTAX (NEEDLE) ×2 IMPLANT
NEEDLE SUPERTRX PREMARK BIOPSY (NEEDLE) ×2 IMPLANT
NS IRRIG 1000ML POUR BTL (IV SOLUTION) ×2 IMPLANT
OIL SILICONE PENTAX (PARTS (SERVICE/REPAIRS)) ×2 IMPLANT
PAD ARMBOARD 7.5X6 YLW CONV (MISCELLANEOUS) ×4 IMPLANT
PATCHES PATIENT (LABEL) ×6 IMPLANT
SYR 20CC LL (SYRINGE) ×2 IMPLANT
SYR 20ML ECCENTRIC (SYRINGE) ×2 IMPLANT
SYR 5ML LL (SYRINGE) ×2 IMPLANT
SYR 5ML LUER SLIP (SYRINGE) ×2 IMPLANT
SYR CONTROL 10ML LL (SYRINGE) IMPLANT
TOWEL OR 17X24 6PK STRL BLUE (TOWEL DISPOSABLE) ×2 IMPLANT
TRAP SPECIMEN MUCOUS 40CC (MISCELLANEOUS) ×2 IMPLANT
TUBE CONNECTING 20X1/4 (TUBING) ×2 IMPLANT
WATER STERILE IRR 1000ML POUR (IV SOLUTION) ×2 IMPLANT

## 2015-11-18 NOTE — Anesthesia Preprocedure Evaluation (Signed)
Anesthesia Evaluation  Patient identified by MRN, date of birth, ID band Patient awake    Reviewed: Allergy & Precautions, NPO status , Patient's Chart, lab work & pertinent test results  Airway Mallampati: II  TM Distance: >3 FB Neck ROM: Full    Dental  (+) Edentulous Lower   Pulmonary Current Smoker,    breath sounds clear to auscultation       Cardiovascular  Rhythm:Regular Rate:Normal     Neuro/Psych    GI/Hepatic   Endo/Other    Renal/GU      Musculoskeletal   Abdominal   Peds  Hematology   Anesthesia Other Findings   Reproductive/Obstetrics                             Anesthesia Physical Anesthesia Plan Anesthesia Quick Evaluation

## 2015-11-18 NOTE — Transfer of Care (Signed)
Immediate Anesthesia Transfer of Care Note  Patient: Jerolyn Center  Procedure(s) Performed: Procedure(s): VIDEO BRONCHOSCOPY WITH ENDOBRONCHIAL ULTRASOUND (N/A) VIDEO BRONCHOSCOPY WITH ENDOBRONCHIAL NAVIGATION (N/A)  Patient Location: PACU  Anesthesia Type:General  Level of Consciousness: awake  Airway & Oxygen Therapy: Patient Spontanous Breathing and Patient connected to nasal cannula oxygen  Post-op Assessment: Report given to RN, Post -op Vital signs reviewed and stable and Patient moving all extremities X 4  Post vital signs: Reviewed and stable  Last Vitals:  Vitals:   11/18/15 0805 11/18/15 1151  BP: (!) 138/54 (!) 161/67  Pulse: 73   Resp: 20   Temp: 36.6 C 36.7 C    Last Pain:  Vitals:   11/18/15 0827  TempSrc:   PainSc: 0-No pain      Patients Stated Pain Goal: 3 (91/47/82 9562)  Complications: No apparent anesthesia complications

## 2015-11-18 NOTE — Interval H&P Note (Signed)
History and Physical Interval Note:  11/18/2015 9:49 AM  Brittany Mercado  has presented today for surgery, with the diagnosis of RLL MASS  The various methods of treatment have been discussed with the patient and family. After consideration of risks, benefits and other options for treatment, the patient has consented to  Procedure(s): VIDEO BRONCHOSCOPY WITH ENDOBRONCHIAL ULTRASOUND (N/A) VIDEO BRONCHOSCOPY WITH ENDOBRONCHIAL NAVIGATION (N/A) as a surgical intervention .  The patient's history has been reviewed, patient examined, no change in status, stable for surgery.  I have reviewed the patient's chart and labs.  Questions were answered to the patient's satisfaction.     Melrose Nakayama

## 2015-11-18 NOTE — Anesthesia Preprocedure Evaluation (Signed)
Anesthesia Evaluation    Airway Mallampati: II  TM Distance: >3 FB Neck ROM: Full    Dental  (+) Edentulous Upper, Poor Dentition   Pulmonary Current Smoker,    breath sounds clear to auscultation + decreased breath sounds      Cardiovascular  Rhythm:Regular Rate:Normal     Neuro/Psych    GI/Hepatic   Endo/Other    Renal/GU      Musculoskeletal   Abdominal   Peds  Hematology   Anesthesia Other Findings   Reproductive/Obstetrics                             Anesthesia Physical Anesthesia Plan  ASA: III  Anesthesia Plan: General   Post-op Pain Management:    Induction: Intravenous  Airway Management Planned: Oral ETT  Additional Equipment:   Intra-op Plan:   Post-operative Plan: Extubation in OR  Informed Consent: I have reviewed the patients History and Physical, chart, labs and discussed the procedure including the risks, benefits and alternatives for the proposed anesthesia with the patient or authorized representative who has indicated his/her understanding and acceptance.   Dental advisory given  Plan Discussed with: CRNA and Anesthesiologist  Anesthesia Plan Comments:         Anesthesia Quick Evaluation

## 2015-11-18 NOTE — Anesthesia Procedure Notes (Signed)
Procedure Name: Intubation Date/Time: 11/18/2015 10:19 AM Performed by: Rejeana Brock L Pre-anesthesia Checklist: Patient identified, Emergency Drugs available, Suction available and Patient being monitored Patient Re-evaluated:Patient Re-evaluated prior to inductionOxygen Delivery Method: Circle System Utilized Preoxygenation: Pre-oxygenation with 100% oxygen Intubation Type: IV induction Ventilation: Mask ventilation without difficulty Laryngoscope Size: Mac and 3 Grade View: Grade I Tube type: Oral Tube size: 8.5 mm Number of attempts: 1 Airway Equipment and Method: Stylet and Oral airway Placement Confirmation: ETT inserted through vocal cords under direct vision,  positive ETCO2 and breath sounds checked- equal and bilateral Secured at: 21 cm Tube secured with: Tape Dental Injury: Teeth and Oropharynx as per pre-operative assessment

## 2015-11-18 NOTE — H&P (View-Only) (Signed)
PCP is No primary care provider on file. Referring Provider is Curt Bears, MD  No chief complaint on file.   HPI: 65 yo woman with a history of tobacco abuse who recently presented with productive cough, fever, chest tightness. In ED CT showed a right lung mass. CT showed a RLL mass abutting the chest wall. Also showed upper lobe predominant emphysema PET showed the mass and hilar nodes were hypermetabolic. There was a questionable subcarinal node. There was activity in the sigmoid colon and right hip.  She is smoking < 1/2 ppd. She denies wheezing. Cough resolved with antibiotics. Has to stop going up one flight of 14 stairs. No weight loss but appetite poor. No HA or visual changes. Denies hip pain.  Zubrod Score: At the time of surgery this patient's most appropriate activity status/level should be described as: _0     0    Normal activity, no symptoms _1     1    Restricted in physical strenuous activity but ambulatory, able to do out light work _2     2    Ambulatory and capable of self care, unable to do work activities, up and about >50 % of waking hours                              _3     3    Only limited self care, in bed greater than 50% of waking hours _4     4    Completely disabled, no self care, confined to bed or chair _5     5    Moribund    Past Medical History:  Diagnosis Date  . COPD (chronic obstructive pulmonary disease) ([place])   . Lung mass   Denies cardiac, renal, GI, hepatic issues, DVT/ PE  Past Surgical History:  Procedure Laterality Date  . ABDOMINAL HYSTERECTOMY    . OOPHORECTOMY  1996   "benign"  . TONSILLECTOMY      Family History  Problem Relation Age of Onset  . Family history unknown: Yes    Social History Social History  Substance Use Topics  . Smoking status: Current Every Day Smoker    Packs/day: 0.75    Years: 40.00  . Smokeless tobacco: Never Used  . Alcohol use No    Current Outpatient Prescriptions  Medication Sig Dispense  Refill  . aspirin 325 MG tablet Take 325 mg by mouth daily.    . Multiple Vitamin (MULTIVITAMIN) tablet Take 1 tablet by mouth daily.     No current facility-administered medications for this visit.     Allergies  Allergen Reactions  . Penicillins Swelling    Review of Systems  Constitutional: Positive for appetite change and fatigue. Negative for fever and unexpected weight change.  HENT: Negative for trouble swallowing and voice change.   Respiratory: Positive for cough and shortness of breath.   Cardiovascular: Negative for chest pain, palpitations and leg swelling.  Gastrointestinal: Negative for abdominal distention, abdominal pain, blood in stool and constipation.  Genitourinary: Negative for difficulty urinating and dysuria.  Musculoskeletal: Negative for arthralgias and joint swelling.  Neurological: Negative for seizures, syncope and weakness.  Hematological: Negative for adenopathy. Does not bruise/bleed easily.  All other systems reviewed and are negative.   There were no vitals taken for this visit. Physical Exam  Constitutional: She is oriented to person, place, and time. She appears well-developed and well-nourished. No distress.  HENT:  Head: Normocephalic and atraumatic.  Mouth/Throat: No oropharyngeal exudate.  Eyes: Conjunctivae and EOM are normal. No scleral icterus.  Neck: Neck supple. No thyromegaly present.  Cardiovascular: Normal rate, regular rhythm, normal heart sounds and intact distal pulses.   No murmur heard. Pulmonary/Chest: Effort normal and breath sounds normal. No respiratory distress. She has no wheezes.  Abdominal: Soft. She exhibits no distension. There is no tenderness.  Musculoskeletal: Normal range of motion. She exhibits no edema.  Lymphadenopathy:    She has no cervical adenopathy.  Neurological: She is alert and oriented to person, place, and time. No cranial nerve deficit. She exhibits normal muscle tone. Coordination normal.  Skin:  Skin is warm and dry.     Diagnostic Tests: NUCLEAR MEDICINE PET SKULL BASE TO THIGH  TECHNIQUE: 5.5 mCi F-18 FDG was injected intravenously. Full-ring PET imaging was performed from the skull base to thigh after the radiotracer. CT data was obtained and used for attenuation correction and anatomic localization.  FASTING BLOOD GLUCOSE:  Value: 89 mg/dl  COMPARISON:  CT scan from 10/10/2015.  FINDINGS: NECK  No hypermetabolic lymph nodes in the neck.  CHEST  Right lower lobe lesion, measuring 2.7 x 2.4 cm, is markedly hypermetabolic with SUV max = 16.  There is hypermetabolic right hilar lymphadenopathy associated with SUV max = 24. 8 mm short axis subcarinal lymph node is not show marked hypermetabolism but has low level FDG uptake take, just above background mediastinal levels with SUV max = 4.1  Moderate to advanced changes of emphysema are noted bilaterally.  ABDOMEN/PELVIS  No abnormal hypermetabolic activity within the liver, pancreas, adrenal glands, or spleen. No hypermetabolic lymph nodes in the abdomen or pelvis.  No evidence for hypermetabolic lymphadenopathy in the abdomen or pelvis.  Of particular note, a 1.7 x 2.0 cm polypoid soft tissue lesion in the distal sigmoid colon (see image 147 series 4) is hypermetabolic with SUV max = 15.  There is abdominal aortic atherosclerosis without aneurysm.  SKELETON  A single suspicious hypermetabolic focus is identified within the bony anatomy. This is in the posterior right acetabulum in corresponds to image 161 of series 4. There is no underlying CT abnormality at this level. SUV max = is 5.5.  IMPRESSION: 1. Hypermetabolic right lower lobe nodule consistent with malignancy. This is associated with metastatic right hilar lymphadenopathy and potentially metastatic spread to a subcarinal lymph node, although the subcarinal disease is not considered definite. 2. 2.0 cm polypoid lesion in  the sigmoid colon is markedly hypermetabolic. Colonic adenoma or adenocarcinoma could have this appearance. Correlation with colorectal cancer screening history recommended and endoscopic evaluation may be indicated. 3. There is a single hypermetabolic focus identified in the bony anatomy, corresponding to the posterior right acetabular region. No underlying abnormality evident on CT. This could potentially be degenerative, but metastatic involvement is a concern. MRI of the right hip without and with contrast may prove helpful to further evaluate. 4. Emphysema. 5. Abdominal aortic atherosclerosis.   Electronically Signed   By: Misty Stanley M.D.   On: 11/07/2015 12:51 CT CHEST WITH CONTRAST  TECHNIQUE: Multidetector CT imaging of the chest was performed during intravenous contrast administration.  CONTRAST:  55m ISOVUE-300 IOPAMIDOL (ISOVUE-300) INJECTION 61%  COMPARISON:  Chest radiograph earlier today.  FINDINGS: Cardiovascular: Normal heart size. Three vessel coronary artery calcification. 3 vessel branching pattern from normal sized aortic arch. Unremarkable pulmonary arteries.  Mediastinum/Nodes: RIGHT hilar adenopathy, 14 mm lesion as seen on image 67 series 2.  Lungs/Pleura: 15 x 24 x 27 mm,  superior segment, RIGHT lower lobe mass abuts the posterior pleura, centrally necrotic, slight pleural reaction. No osseous destruction. Neoplasm is likely. 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  Upper Abdomen: Unremarkable  Musculoskeletal: No osseous lesions are apparent.  IMPRESSION: Superior segment RIGHT lower lobe mass 15 x 24 x 27 mm with adjacent pathologic RIGHT hilar adenopathy. Findings are strongly suspicious for carcinoma of the lung. Tissue sampling is warranted.  COPD.   Electronically Signed   By: Staci Righter M.D.   On: 10/10/2015 21:27 I personally reviewed the PET and CT and concur with the findings noted  above  Impression: 65 yo woman with a RLL mass and hilar adenopathy. This is almost certainly non-small cell carcinoma. She needs a tissue diagnosis and staging. She has at least stage II disease but possibly stage III if CW involvement or subcarinal node is positive. I doubt the hip lesion is a met but there is a chance she has stage IV disease  Needs GI eval for colonoscopy and MR of hip  She needs PFTs although I do not think she will be a candidate for resection based on the severity of her emphysematous changes on CT  I recommended she have navigational bronchoscopy and EBUS for diagnostic and staging purposes. I described the procedure to her I detail. She understands this would be done under GA in the operating room as an outpatient procedure. No guarantee of a definitive diagnosis. She understands the risks include those of GA such as MI, stroke, hypoxia. She understands the risks of the procedure itself include but are not limited to bleeding, pneumothorax, failure to make a diagnosis. There is also the possibility of other unforeseeable complications   Will schedule ENB/ EBUS next week.    Melrose Nakayama, MD [facility] [phone]

## 2015-11-18 NOTE — Brief Op Note (Signed)
11/18/2015  12:00 PM  PATIENT:  Brittany Mercado  65 y.o. female  PRE-OPERATIVE DIAGNOSIS:  RIGHT LOWER LOBE  MASS, RIGHT HILAR ADENOPATHY  POST-OPERATIVE DIAGNOSIS:  Non-small cell carcinoma, Clinical stage IV  PROCEDURE:  Procedure(s): VIDEO BRONCHOSCOPY WITH ENDOBRONCHIAL ULTRASOUND (N/A) VIDEO BRONCHOSCOPY WITH ENDOBRONCHIAL NAVIGATION (N/A)  SURGEON:  Surgeon(s) and Role:    * Melrose Nakayama, MD - Primary  ANESTHESIA:   general  EBL:  Total I/O In: 900 [I.V.:900] Out: 1 [Blood:1]  BLOOD ADMINISTERED:none  DRAINS: none   LOCAL MEDICATIONS USED:  NONE  SPECIMEN:  Source of Specimen:  10R & 7 nodes, RLL mass  DISPOSITION OF SPECIMEN:  PATHOLOGY  PLAN OF CARE: Discharge to home after PACU  PATIENT DISPOSITION:  PACU - hemodynamically stable.   Delay start of Pharmacological VTE agent (>24hrs) due to surgical blood loss or risk of bleeding: not applicable

## 2015-11-18 NOTE — Discharge Instructions (Signed)
Do not drive or engage in heavy physical activity for 24 hours  You may resume normal activities tomorrow  You may cough up small amounts of blood over the next few days. Call if you cough up more than 2 tablespoons  Call [phone] if you develop chest pain, shortness of breath or cough up more than 2 tablespoons of blood  You may use acetaminophen (Tylenol) if needed for discomfort  Dr. Worthy Flank office will contact you for follow up

## 2015-11-20 NOTE — Anesthesia Postprocedure Evaluation (Signed)
Anesthesia Post Note  Patient: Brittany Mercado  Procedure(s) Performed: Procedure(s) (LRB): VIDEO BRONCHOSCOPY WITH ENDOBRONCHIAL ULTRASOUND (N/A) VIDEO BRONCHOSCOPY WITH ENDOBRONCHIAL NAVIGATION (N/A)  Patient location during evaluation: PACU Anesthesia Type: General Level of consciousness: awake, awake and alert and oriented Pain management: pain level controlled Vital Signs Assessment: post-procedure vital signs reviewed and stable Respiratory status: spontaneous breathing, nonlabored ventilation and respiratory function stable Anesthetic complications: no    Last Vitals:  Vitals:   11/18/15 1257 11/18/15 1309  BP: 132/62 (!) 151/61  Pulse: 84 84  Resp: 19 17  Temp: 36.7 C     Last Pain:  Vitals:   11/18/15 1230  TempSrc:   PainSc: 0-No pain                 Markelle Najarian COKER

## 2015-11-21 ENCOUNTER — Encounter: Payer: Self-pay | Admitting: *Deleted

## 2015-11-21 ENCOUNTER — Encounter (HOSPITAL_COMMUNITY): Payer: Self-pay | Admitting: Thoracic Surgery (Cardiothoracic Vascular Surgery)

## 2015-11-21 ENCOUNTER — Encounter: Payer: Self-pay | Admitting: Internal Medicine

## 2015-11-21 NOTE — Progress Notes (Signed)
Oncology Nurse Navigator Documentation  Oncology Nurse Navigator Flowsheets 11/21/2015  Navigator Encounter Type Other/per Dr. Worthy Flank request I ask [facility] to send tissue for foundation one and PDL 1.    Treatment Phase Pre-Tx/Tx Discussion  Barriers/Navigation Needs Coordination of Care  Interventions Coordination of Care  Coordination of Care Other  Acuity Level 2  Acuity Level 2 Other  Time Spent with Patient 30

## 2015-11-23 NOTE — Op Note (Signed)
NAMECARLENA, Mercado                 ACCOUNT NO.:  [id number]  MEDICAL RECORD NO.:  [id number]  LOCATION:  [facility]                         FACILITY:  [facility]  PHYSICIAN:  Revonda Standard. Roxan Hockey, M.D.DATE OF BIRTH:  [birth year]  DATE OF PROCEDURE:  11/18/2015 DATE OF DISCHARGE:  11/18/2015                              OPERATIVE REPORT   PREOPERATIVE DIAGNOSIS:  Right lower lobe mass with hilar adenopathy.  POSTOPERATIVE DIAGNOSIS:  Non-small cell carcinoma, clinical stage IV.  PROCEDURE:   1.Electromagnetic navigational bronchoscopy with needle aspirations, brushings, and biopsies.  2. Endobronchial ultrasound withmediastinal lymph node aspirations.  SURGEON:  Revonda Standard. Roxan Hockey, M.D.  ASSISTANT:  None.  ANESTHESIA:  General.  FINDINGS:  Needle aspirations of 10R node revealed non-small cell carcinoma.  Needle aspirations and brushings from right lower lobe mass showed diagnostic material. Aspirations from level 7 node showed no evidence of tumor.  CLINICAL NOTE:  Brittany Mercado is a 65 year old woman with a history of tobacco abuse who presented with cough, fever and chest tightness.  A CT showed a right lung mass abutting the chest wall.  On PET, the mass and hilar nodes were hypermetabolic.  There also was questionable activity in a subcarinal node and activity in the right hip as well.  The patient was advised to undergo navigational bronchoscopy and endobronchial ultrasound for diagnostic and staging purposes.  In the interim, an MRI of the pelvis showed that the area at the right pelvis had an appearance consistent with metastatic disease.  OPERATIVE NOTE:  Brittany Mercado was brought to the operating room on November 18, 2015.  She was anesthetized and intubated.  Flexible fiberoptic bronchoscopy was performed via the endotracheal tube.  It revealed normal endobronchial anatomy with no endobronchial lesions.  The endobronchial ultrasound probe was advanced and  systematic inspection of the hilar mediastinal nodes was carried out.  There was a level 10R node that was accessible for aspirations.  Needle aspirations were performed of this node.  With each aspiration, the needle was advanced into the lymph node with ultrasound visualization.  Suction was applied and 10-12 passes were made with the needle.  The specimens were placed on the slides as well as into cytologic preparation fluid.  Next, the level 7 node was identified.  This was relatively small and not entirely clear that it was a lymph node, but there was no Doppler signal to indicate a blood vessel.  Aspirations were performed of this area as well.  These specimens were sent out for quick prep.  While awaiting those results, the bronchoscope was reinserted, and the locatable guide for navigation was placed.  Registration was performed. There wws good correlation of the video and virtual bronchoscopy.  The sheath then was navigated to within a centimeter of the right lower lobe mass.  Needle aspirations, brushings, and biopsies were taken.  The needle aspirations and brushings were sent for quick prep.  The biopsies were all sent for permanent pathology.  There was some bleeding with biopsies, but this cleared quickly with irrigation with saline.  The quick prep was returned showing non-small cell carcinoma in the 10R node.  The level 7 node was negative for tumor.  There was diagnostic material present on the brushings and needle aspirations from the right lower lobe nodule.  A final inspection was made with the bronchoscope. There was no ongoing bleeding.  The bronchoscope was removed.  The patient was extubated in the operating room and taken to the postanesthetic care unit in good condition.     Revonda Standard Roxan Hockey, M.D.     SCH/MEDQ  D:  11/22/2015  T:  11/23/2015  Job:  [id number]

## 2015-11-24 ENCOUNTER — Telehealth: Payer: Self-pay | Admitting: Internal Medicine

## 2015-11-24 NOTE — Telephone Encounter (Signed)
Left message for patient re 10/4 f/u. Patient already on schedule with [facility].

## 2015-11-30 ENCOUNTER — Telehealth: Payer: Self-pay | Admitting: Internal Medicine

## 2015-11-30 ENCOUNTER — Telehealth: Payer: Self-pay | Admitting: *Deleted

## 2015-11-30 ENCOUNTER — Ambulatory Visit (HOSPITAL_BASED_OUTPATIENT_CLINIC_OR_DEPARTMENT_OTHER): Payer: Self-pay | Admitting: Internal Medicine

## 2015-11-30 ENCOUNTER — Encounter: Payer: Self-pay | Admitting: Internal Medicine

## 2015-11-30 DIAGNOSIS — C3491 Malignant neoplasm of unspecified part of right bronchus or lung: Secondary | ICD-10-CM | POA: Insufficient documentation

## 2015-11-30 DIAGNOSIS — Z5111 Encounter for antineoplastic chemotherapy: Secondary | ICD-10-CM

## 2015-11-30 DIAGNOSIS — C3431 Malignant neoplasm of lower lobe, right bronchus or lung: Secondary | ICD-10-CM

## 2015-11-30 HISTORY — DX: Malignant neoplasm of unspecified part of right bronchus or lung: C34.91

## 2015-11-30 HISTORY — DX: Encounter for antineoplastic chemotherapy: Z51.11

## 2015-11-30 MED ORDER — FOLIC ACID 1 MG PO TABS: 1.0000 mg | ORAL_TABLET | Freq: Every day | ORAL | 4 refills | Status: DC

## 2015-11-30 MED ORDER — PROCHLORPERAZINE MALEATE 10 MG PO TABS: 10.0000 mg | ORAL_TABLET | Freq: Four times a day (QID) | ORAL | 0 refills | Status: DC | PRN

## 2015-11-30 MED ORDER — CYANOCOBALAMIN 1000 MCG/ML IJ SOLN
1000.0000 ug | Freq: Once | INTRAMUSCULAR | Status: AC
  Administered 2015-11-30: 1000 ug via INTRAMUSCULAR

## 2015-11-30 MED ORDER — DEXAMETHASONE 4 MG PO TABS: ORAL_TABLET | ORAL | 1 refills | Status: DC

## 2015-11-30 MED ORDER — CYANOCOBALAMIN 1000 MCG/ML IJ SOLN
INTRAMUSCULAR | Status: AC
  Filled 2015-11-30: qty 1

## 2015-11-30 NOTE — Progress Notes (Signed)
START ON PATHWAY REGIMEN - Non-Small Cell Lung  [id number]: Carboplatin AUC=5 + Pemetrexed 500 mg/m2 + Bevacizumab 15 mg/kg q21 Days x 4 Cycles   A cycle is every 21 days:     Carboplatin (Paraplatin(R)) AUC=5 in 250 mL NS IV over 1 hour Dose Mod: None     Pemetrexed (Alimta(R)) 500 mg/m2 in 100 mL NS IV over 10 minutes, manufacturer recommends not administering to patients with CrCl < 45 mL/min Dose Mod: None     Bevacizumab (Avastin(R)) 15 mg/kg in 100 mL NS IV over 90 minutes first infusion, 60 minutes second infusion and 30 minutes all subsequent infusions if tolerated Dose Mod: None Additional Orders: * All AUC calculations intended to be used in [employer] formula Note: Patient to receive the following prior to the initiation of therapy: 1) Dexamethasone 4 mg orally twice daily x 6 doses.  First dose 24 hours before chemotherapy. 2) Folic acid >= 811 mcg orally daily.  First dose at least 5 days prior to the first dose of pemetrexed. 3) Vitamin B12 1,000 mcg intramuscularly every 9 weeks.  First dose at least 5 days prior to the first dose of pemetrexed.  **Always confirm dose/schedule in your pharmacy ordering system**    Patient Characteristics: Stage IV Metastatic, Non Squamous, Initial Chemotherapy/Immunotherapy, PS = 0, 1, PD-L1 Expression Positive 1-49% (TPS) / Negative / Not Tested / Not a Candidate for Immunotherapy AJCC M Stage: 1 AJCC N Stage: 2 AJCC T Stage: 1b Current Disease Status: Distant Metastases AJCC Stage Grouping: IV Histology: Non Squamous Cell ROS1 Rearrangement Status: Awaiting Test Results T790M Mutation Status: Not Applicable - EGFR Mutation Negative/Unknown Other Mutations/Biomarkers: No Other Actionable Mutations PD-L1 Expression Status: Quantity Not Sufficient Chemotherapy/Immunotherapy LOT: Initial Chemotherapy/Immunotherapy Molecular Targeted Therapy: Not Appropriate ALK Translocation Status: Awaiting Test Results Would you be surprised if this patient  died  in the next year? I would NOT be surprised if this patient died in the next year EGFR Mutation Status: Awaiting Test Results BRAF V600E Mutation Status: Awaiting Test Results Performance Status: PS = 0, 1  Intent of Therapy: Non-Curative / Palliative Intent, Discussed with Patient

## 2015-11-30 NOTE — Telephone Encounter (Signed)
Patient called she is on the way she will be a few minutes late for her appointment.

## 2015-11-30 NOTE — Telephone Encounter (Signed)
GAVE PATIENT AVS REPORT AND APPOINTMENTS FOR October AND November

## 2015-11-30 NOTE — Telephone Encounter (Signed)
Per MD, pt will begin Chemotherapy on 10/18. Called [facility] and r/s pt's colonoscopy RN visit 10/6 and procedure 10/12. Called pt and explained above information. Pt verbalized understanding.  No further concerns.

## 2015-11-30 NOTE — Progress Notes (Signed)
[place] [phone]   [phone] Multidisciplinary thoracic oncology clinic  OFFICE PROGRESS NOTE  No PCP Per Patient No address on file  DIAGNOSIS: Stage IV (T1b, N2, M1b) non-small cell lung cancer, adenocarcinoma, presented with hypermetabolic right lower lobe nodule with metastatic right hilar lymphadenopathy and potentially subcarinal lymphadenopathy as well as suspicious single hypermetabolic focus in the right acetabular region.  PRIOR THERAPY: None.   CURRENT THERAPY: Systemic chemotherapy with carboplatin for AUC of 5, Alimta 500 MG/M2 and Avastin 15 MG/KG every 3 weeks. First dose on 12/14/2015  INTERVAL HISTORY: Brittany Mercado 65 y.o. female returns to the clinic today for accompanied by her sister, daughter and son. The patient is feeling fine today. She denied having any significant chest pain but has shortness breath with exertion with no cough or hemoptysis. She has no nausea, vomiting, diarrhea or constipation. She denied having any significant weight loss or night sweats. She was seen recently by Dr. Roxan Hockey and underwent electromagnetic navigational bronchoscopy with needle aspiration, brushing and biopsies as well as endobronchial ultrasound with mediastinal lymph node aspirations on 11/18/2015. The final pathology (Accession: [id number]) was consistent with non-small cell carcinoma. mmunohistochemistry shows the tumor is positive for thyroid transcription factor-1 and cytokeratin AE1/AE3. The tumor is focal, weakly positive to negative with cytokeratin 5/6. The immunophenotype favors adenocarcinoma. Molecular studies are still pending. She also had MRI of the pelvis performed on 11/16/2015 and it showed abnormal bone lesion at the right acetabular-inferior pubic ramus junction most concerning for metastatic disease. She was also referred to gastroenterology for evaluation of the sigmoid mass and she is expected to see Dr. [name] next  week for evaluation. The patient is here today for evaluation and discussion of her treatment options.  MEDICAL HISTORY: Past Medical History:  Diagnosis Date  . Complication of anesthesia    hard to wake up  . COPD (chronic obstructive pulmonary disease) ([place])   . Eczema   . Heart murmur    told at age 46 but never has had any problems  . Lung mass   . Pneumonia 09/2015    ALLERGIES:  is allergic to penicillins.  MEDICATIONS:  Current Outpatient Prescriptions  Medication Sig Dispense Refill  . aspirin 325 MG tablet Take 325 mg by mouth daily.    . Multiple Vitamin (MULTIVITAMIN) tablet Take 1 tablet by mouth daily.     No current facility-administered medications for this visit.     SURGICAL HISTORY:  Past Surgical History:  Procedure Laterality Date  . ABDOMINAL HYSTERECTOMY    . OOPHORECTOMY  1996   tumor removed from right ovary  . TONSILLECTOMY    . VIDEO BRONCHOSCOPY WITH ENDOBRONCHIAL NAVIGATION N/A 11/18/2015   Procedure: VIDEO BRONCHOSCOPY WITH ENDOBRONCHIAL NAVIGATION;  Surgeon: Melrose Nakayama, MD;  Location: [facility];  Service: Thoracic;  Laterality: N/A;  . VIDEO BRONCHOSCOPY WITH ENDOBRONCHIAL ULTRASOUND N/A 11/18/2015   Procedure: VIDEO BRONCHOSCOPY WITH ENDOBRONCHIAL ULTRASOUND;  Surgeon: Melrose Nakayama, MD;  Location: [facility];  Service: Thoracic;  Laterality: N/A;    REVIEW OF SYSTEMS:  Constitutional: positive for fatigue Eyes: negative Ears, nose, mouth, throat, and face: negative Respiratory: positive for dyspnea on exertion Cardiovascular: negative Gastrointestinal: negative Genitourinary:negative Integument/breast: negative Hematologic/lymphatic: negative Musculoskeletal:negative Neurological: negative Behavioral/Psych: negative Endocrine: negative Allergic/Immunologic: negative   PHYSICAL EXAMINATION: General appearance: alert, cooperative, fatigued and no distress Head: Normocephalic, without obvious abnormality, atraumatic Neck: no  adenopathy, no carotid bruit, supple, symmetrical, trachea midline and thyroid not  enlarged, symmetric, no tenderness/mass/nodules Lymph nodes: Cervical, supraclavicular, and axillary nodes normal. Resp: clear to auscultation bilaterally Back: symmetric, no curvature. ROM normal. No CVA tenderness. Cardio: regular rate and rhythm, S1, S2 normal, no murmur, click, rub or gallop GI: soft, non-tender; bowel sounds normal; no masses,  no organomegaly Extremities: extremities normal, atraumatic, no cyanosis or edema Neurologic: Alert and oriented X 3, normal strength and tone. Normal symmetric reflexes. Normal coordination and gait  ECOG PERFORMANCE STATUS: 1 - Symptomatic but completely ambulatory  Blood pressure (!) 151/43, pulse 96, temperature 98.5 F (36.9 C), temperature source Oral, resp. rate 18, weight 110 lb 3.2 oz (50 kg), SpO2 98 %.  LABORATORY DATA: Lab Results  Component Value Date   WBC 9.0 11/15/2015   HGB 13.1 11/15/2015   HCT 38.1 11/15/2015   MCV 82.3 11/15/2015   PLT 264 11/15/2015      Chemistry      Component Value Date/Time   NA 138 11/15/2015 1512   NA 139 11/10/2015 1409   K 4.2 11/15/2015 1512   K 4.3 11/10/2015 1409   CL 103 11/15/2015 1512   CO2 26 11/15/2015 1512   CO2 27 11/10/2015 1409   BUN 10 11/15/2015 1512   BUN 9.6 11/10/2015 1409   CREATININE 1.25 (H) 11/15/2015 1512   CREATININE 1.2 (H) 11/10/2015 1409      Component Value Date/Time   CALCIUM 9.8 11/15/2015 1512   CALCIUM 9.4 11/10/2015 1409   ALKPHOS 187 (H) 11/15/2015 1512   ALKPHOS 204 (H) 11/10/2015 1409   AST 27 11/15/2015 1512   AST 22 11/10/2015 1409   ALT 10 (L) 11/15/2015 1512   ALT 10 11/10/2015 1409   BILITOT 0.7 11/15/2015 1512   BILITOT 0.33 11/10/2015 1409       RADIOGRAPHIC STUDIES: Dg Chest 2 View  Result Date: 11/15/2015 CLINICAL DATA:  Preop for video bronchoscopy and endobronchial ultrasound. EXAM: CHEST  2 VIEW COMPARISON:  CT 11/11/2015 and 10/10/2015  FINDINGS: Again noted is a poorly defined lesion in the right lung. This opacity corresponds with the known pleural-based lesion in superior segment of the right lower lobe. Lucency in the upper lungs compatible with emphysema. No new parenchymal lung opacities. No evidence for pulmonary edema. Heart size is within normal limits and stable. Mild fullness in the right hilum is unchanged. No large pleural effusions. No acute bone abnormality. IMPRESSION: No acute chest abnormality. Stable appearance of the right lower lobe lesion. Electronically Signed   By: Markus Daft M.D.   On: 11/15/2015 16:31   Mr Pelvis W Wo Contrast  Result Date: 11/17/2015 CLINICAL DATA:  Lung mass. Abnormal PET scan involving the right acetabulum concerning for malignancy. EXAM: MRI PELVIS WITHOUT AND WITH CONTRAST TECHNIQUE: Multiplanar multisequence MR imaging of the pelvis was performed both before and after administration of intravenous contrast. CONTRAST:  34m MULTIHANCE GADOBENATE DIMEGLUMINE 529 MG/ML IV SOLN COMPARISON:  None. FINDINGS: Bone Abnormal marrow lesion in the right acetabular -inferior pubic ramus junction measuring 2.1 x 0.8 x 1.9 cm. The marrow lesion demonstrates T1 hypointensity, T2 hyperintensity and enhancement on postcontrast imaging. The appearance is most concerning for metastatic disease. Subchondral cystic changes involving the superior medial hip joint bilaterally which are likely reactive and not secondary to malignancy. No fracture, dislocation or avascular necrosis Normal sacrum and sacroiliac joints. No SI joint widening or erosive changes. New Alignment Normal. No subluxation. Dysplasia None. Joint effusion None. Labrum Grossly intact, but evaluation is limited by lack of intraarticular fluid.  Cartilage Femoral cartilage: No focal chondral defect. Acetabular cartilage: No focal chondral defect. Capsule and ligaments Normal. Muscles and Tendons Flexors: Normal. Extensors: Normal. Abductors: Normal.  Adductors: Normal. Rotators: Normal. Hamstrings: Normal. Other Findings None Viscera Normal. No abnormality seen in pelvis. No lymphadenopathy. No free fluid in the pelvis. IMPRESSION: 1. Abnormal bone lesion at the right acetabular-inferior pubic ramus junction most concerning for metastatic disease. Electronically Signed   By: Kathreen Devoid   On: 11/17/2015 09:35   Nm Pet Image Initial (pi) Skull Base To Thigh  Result Date: 11/07/2015 CLINICAL DATA:  Initial treatment strategy for lung mass. EXAM: NUCLEAR MEDICINE PET SKULL BASE TO THIGH TECHNIQUE: 5.5 mCi F-18 FDG was injected intravenously. Full-ring PET imaging was performed from the skull base to thigh after the radiotracer. CT data was obtained and used for attenuation correction and anatomic localization. FASTING BLOOD GLUCOSE:  Value: 89 mg/dl COMPARISON:  CT scan from 10/10/2015. FINDINGS: NECK No hypermetabolic lymph nodes in the neck. CHEST Right lower lobe lesion, measuring 2.7 x 2.4 cm, is markedly hypermetabolic with SUV max = 16. There is hypermetabolic right hilar lymphadenopathy associated with SUV max = 24. 8 mm short axis subcarinal lymph node is not show marked hypermetabolism but has low level FDG uptake take, just above background mediastinal levels with SUV max = 4.1 Moderate to advanced changes of emphysema are noted bilaterally. ABDOMEN/PELVIS No abnormal hypermetabolic activity within the liver, pancreas, adrenal glands, or spleen. No hypermetabolic lymph nodes in the abdomen or pelvis. No evidence for hypermetabolic lymphadenopathy in the abdomen or pelvis. Of particular note, a 1.7 x 2.0 cm polypoid soft tissue lesion in the distal sigmoid colon (see image 147 series 4) is hypermetabolic with SUV max = 15. There is abdominal aortic atherosclerosis without aneurysm. SKELETON A single suspicious hypermetabolic focus is identified within the bony anatomy. This is in the posterior right acetabulum in corresponds to image 161 of series 4.  There is no underlying CT abnormality at this level. SUV max = is 5.5. IMPRESSION: 1. Hypermetabolic right lower lobe nodule consistent with malignancy. This is associated with metastatic right hilar lymphadenopathy and potentially metastatic spread to a subcarinal lymph node, although the subcarinal disease is not considered definite. 2. 2.0 cm polypoid lesion in the sigmoid colon is markedly hypermetabolic. Colonic adenoma or adenocarcinoma could have this appearance. Correlation with colorectal cancer screening history recommended and endoscopic evaluation may be indicated. 3. There is a single hypermetabolic focus identified in the bony anatomy, corresponding to the posterior right acetabular region. No underlying abnormality evident on CT. This could potentially be degenerative, but metastatic involvement is a concern. MRI of the right hip without and with contrast may prove helpful to further evaluate. 4. Emphysema. 5. Abdominal aortic atherosclerosis. Electronically Signed   By: Misty Stanley M.D.   On: 11/07/2015 12:51   Dg Chest Port 1 View  Result Date: 11/18/2015 CLINICAL DATA:  Status post bronchoscopy with biopsy. EXAM: PORTABLE CHEST 1 VIEW COMPARISON:  11/15/2015 FINDINGS: 1217 hours. Lungs are hyperexpanded with underlying chronic interstitial changes. No evidence for pneumothorax, focal airspace consolidation, or pleural effusion. Right parahilar lesions seen on the previous study less evident today. Vascular congestion noted. IMPRESSION: No evidence for pneumothorax after bronchoscopy. Electronically Signed   By: Misty Stanley M.D.   On: 11/18/2015 12:28   Ct Super D Chest Wo Contrast  Result Date: 11/11/2015 CLINICAL DATA:  Pulmonary mass. Super D chest CT for preoperative planning. EXAM: CT CHEST WITHOUT CONTRAST TECHNIQUE: Multidetector  CT imaging of the chest was performed using thin slice collimation for electromagnetic bronchoscopy planning purposes, without intravenous contrast.  COMPARISON:  PET-CT 11/07/2015 FINDINGS: Cardiovascular: The heart is normal in size. No pericardial effusion. Stable aortic calcifications coronary artery calcifications. Mediastinum/Nodes: Stable right hilar nodal lesion. Small scattered mediastinal nodes. The esophagus is grossly normal. Lungs/Pleura: Severe emphysematous are again demonstrated. There is a persistent irregular nodular mass in the right lower lobe measuring a maximum of 2.7 cm. Could not exclude pleural/chest wall. No other pulmonary nodules or acute overlying pulmonary process. Upper Abdomen: No significant upper abdominal findings. No obvious adrenal or hepatic metastatic. Musculoskeletal: Intact bony structures. No worrisome bone lesions suggest metastatic disease. IMPRESSION: Stable right lower lobe lung lesion and right hilar lymphadenopathy. Severe emphysema. Electronically Signed   By: Marijo Sanes M.D.   On: 11/11/2015 17:17   Dg C-arm Bronchoscopy  Result Date: 11/18/2015 CLINICAL DATA:  C-ARM BRONCHOSCOPY Fluoroscopy was utilized by the requesting physician.  No radiographic interpretation.    ASSESSMENT AND PLAN: This is a very pleasant 65 years old African-American female recently diagnosed with stage IV non-small cell lung cancer, adenocarcinoma. Unfortunately MRI of the pelvis showed highly suspicious metastatic lesion in the right acetabular area. Molecular studies are still pending. I had a lengthy discussion with the patient and her family about her current disease stage, prognosis and treatment options. The patient her family understand that she has incurable condition and on the treatment will be of palliative nature. I gave her the option of palliative care and hospice referral versus consideration of palliative systemic chemotherapy with carboplatin for AUC of 5, Alimta 500 MG/M2 and Avastin 15 MG/KG every 3 weeks. She is interested in proceeding with systemic chemotherapy. I discussed with the patient adverse  effects of this treatment including but not limited to alopecia, myelosuppression, nausea and vomiting, peripheral neuropathy, liver or renal dysfunction in addition to the adverse effect of Avastin including risk for fatal pulmonary hemorrhage, GI perforation, wound healing delay as well as hypertension and proteinuria. We will arrange for the patient to receive vitamin B 12 injection today. The patient would also receive prescription for Compazine 10 mg by mouth every 6 hours as needed for nausea, Decadron 4 mg by mouth twice a day, the day before, day of and day after the chemotherapy in addition to folic acid 1 mg by mouth daily. I will arrange for the patient to have a chemotherapy education class before starting the first dose of the chemotherapy. She is expected to start the first dose of this treatment on 12/14/2015. She would come back for follow-up visit on 12/21/2015. I will contact the gastroenterology office to see if he can move the colonoscopy sooner than 12/19/2015. She was advised to call immediately if she has any concerning symptoms in the interval.  The patient voices understanding of current disease status and treatment options and is in agreement with the current care plan.  All questions were answered. The patient knows to call the clinic with any problems, questions or concerns. We can certainly see the patient much sooner if necessary.  I spent 20 minutes counseling the patient face to face. The total time spent in the appointment was 30 minutes.  Disclaimer: This note was dictated with voice recognition software. Similar sounding words can inadvertently be transcribed and may not be corrected upon review.

## 2015-12-02 ENCOUNTER — Ambulatory Visit (AMBULATORY_SURGERY_CENTER): Payer: Self-pay

## 2015-12-02 VITALS — Ht 63.0 in | Wt 111.4 lb

## 2015-12-02 DIAGNOSIS — Z1211 Encounter for screening for malignant neoplasm of colon: Secondary | ICD-10-CM

## 2015-12-02 MED ORDER — SUPREP BOWEL PREP KIT 17.5-3.13-1.6 GM/177ML PO SOLN: 1.0000 | Freq: Once | ORAL | 0 refills | Status: AC

## 2015-12-02 NOTE — Progress Notes (Signed)
No allergies to eggs or soy No past problems with anesthesia except had hard time waking up after tonsillectomy No diet meds No home oxygen  Registered emmi

## 2015-12-05 ENCOUNTER — Other Ambulatory Visit: Payer: Self-pay

## 2015-12-06 ENCOUNTER — Encounter (HOSPITAL_COMMUNITY): Payer: Self-pay

## 2015-12-06 ENCOUNTER — Encounter: Payer: Self-pay | Admitting: Internal Medicine

## 2015-12-06 NOTE — Progress Notes (Signed)
Called patient to follow up on Medicaid application that I mailed. Patient states she received it and turned it in to DSS already. Advised patient that I have some additional applications for her to complete for assistance with her treatment drugs. Patient will bring her proof of income on 12/14/15 and I will have her sign her applications at that time. I will also see if patient qualifies for [place] and refer to [place] if needed for other transportation grant. Patient has my card for any additional financial questions or concerns.

## 2015-12-08 ENCOUNTER — Ambulatory Visit (AMBULATORY_SURGERY_CENTER): Payer: Self-pay | Admitting: Internal Medicine

## 2015-12-08 ENCOUNTER — Encounter: Payer: Self-pay | Admitting: Internal Medicine

## 2015-12-08 VITALS — BP 122/56 | HR 65 | Temp 96.4°F | Resp 12 | Ht 63.0 in | Wt 111.0 lb

## 2015-12-08 DIAGNOSIS — D125 Benign neoplasm of sigmoid colon: Secondary | ICD-10-CM

## 2015-12-08 DIAGNOSIS — R933 Abnormal findings on diagnostic imaging of other parts of digestive tract: Secondary | ICD-10-CM

## 2015-12-08 DIAGNOSIS — D123 Benign neoplasm of transverse colon: Secondary | ICD-10-CM

## 2015-12-08 DIAGNOSIS — K635 Polyp of colon: Secondary | ICD-10-CM

## 2015-12-08 DIAGNOSIS — D128 Benign neoplasm of rectum: Secondary | ICD-10-CM

## 2015-12-08 DIAGNOSIS — D120 Benign neoplasm of cecum: Secondary | ICD-10-CM

## 2015-12-08 DIAGNOSIS — Z1211 Encounter for screening for malignant neoplasm of colon: Secondary | ICD-10-CM

## 2015-12-08 DIAGNOSIS — D129 Benign neoplasm of anus and anal canal: Secondary | ICD-10-CM

## 2015-12-08 MED ORDER — SODIUM CHLORIDE 0.9 % IV SOLN: 500.0000 mL | INTRAVENOUS | Status: DC

## 2015-12-08 NOTE — Progress Notes (Signed)
Called to room to assist during endoscopic procedure.  Patient ID and intended procedure confirmed with present staff. Received instructions for my participation in the procedure from the performing physician.  

## 2015-12-08 NOTE — Op Note (Signed)
[facility] Patient Name: Brittany Mercado Procedure Date: 12/08/2015 1:56 PM MRN: [id number] Endoscopist: Docia Chuck. [name] , MD Age: 65 Referring MD:  Date of Birth: 05-Sep-1950 Gender: Female Account #: [id number] Procedure:                Colonoscopy, with cold snare polypectomy X7; hot                            snare x 1 Indications:              Abnormal PET scan of the GI tract. Abnormal area in                            sigmoid colon. History of metastatic lung cancer                            currently Medicines:                Monitored Anesthesia Care Procedure:                Pre-Anesthesia Assessment:                           - Prior to the procedure, a History and Physical                            was performed, and patient medications and                            allergies were reviewed. The patient's tolerance of                            previous anesthesia was also reviewed. The risks                            and benefits of the procedure and the sedation                            options and risks were discussed with the patient.                            All questions were answered, and informed consent                            was obtained. Prior Anticoagulants: The patient has                            taken no previous anticoagulant or antiplatelet                            agents. ASA Grade Assessment: III - A patient with                            severe systemic disease. After reviewing the risks  and benefits, the patient was deemed in                            satisfactory condition to undergo the procedure.                           After obtaining informed consent, the colonoscope                            was passed under direct vision. Throughout the                            procedure, the patient's blood pressure, pulse, and                            oxygen saturations were monitored continuously. The                             Model CF-HQ190L [id number]) scope was introduced                            through the anus and advanced to the the cecum,                            identified by appendiceal orifice and ileocecal                            valve. The ileocecal valve, appendiceal orifice,                            and rectum were photographed. The quality of the                            bowel preparation was excellent. The colonoscopy                            was performed without difficulty. The patient                            tolerated the procedure well. The bowel preparation                            used was SUPREP. Scope In: 2:08:27 PM Scope Out: 2:31:48 PM Scope Withdrawal Time: 0 hours 20 minutes 6 seconds  Total Procedure Duration: 0 hours 23 minutes 21 seconds  Findings:                 A 25 mm polyp was found in the sigmoid colon. The                            polyp was pedunculated. The polyp was removed with                            a hot snare. Resection and retrieval were complete.  Seven sessile polyps were found in the rectum,                            transverse colon and cecum. The polyps were 5 to 15                            mm in size. These polyps were removed with a cold                            snare. Resection and retrieval were complete.                           Multiple diverticula were found in the left colon.                           There were multiple small AVMs in the cecum and                            ascending colon. Nonbleeding.The exam was otherwise                            without abnormality on direct and retroflexion                            views. Complications:            No immediate complications. Estimated blood loss:                            None. Estimated Blood Loss:     Estimated blood loss: none. Impression:               - One 25 mm polyp in the sigmoid colon, removed                             with a hot snare. Resected and retrieved.                           - Seven 5 to 15 mm polyps in the rectum, in the                            transverse colon and in the cecum, removed with a                            cold snare. Resected and retrieved.                           - Diverticulosis in the left colon.                           - Multiple AVMs                           - The examination was otherwise normal on direct  and retroflexion views. Recommendation:           - Repeat colonoscopy in 3 years for surveillance to                            be considered, if medically fit.                           - Patient has a contact number available for                            emergencies. The signs and symptoms of potential                            delayed complications were discussed with the                            patient. Return to normal activities tomorrow.                            Written discharge instructions were provided to the                            patient.                           - Resume previous diet.                           - Continue present medications.                           - Await pathology results. Docia Chuck. [name], MD 12/08/2015 2:49:18 PM This report has been signed electronically.

## 2015-12-08 NOTE — Progress Notes (Signed)
To PACU, vss patent aw report to rn 

## 2015-12-08 NOTE — Patient Instructions (Signed)
YOU HAD AN ENDOSCOPIC PROCEDURE TODAY AT [facility]:   Refer to the procedure report that was given to you for any specific questions about what was found during the examination.  If the procedure report does not answer your questions, please call your gastroenterologist to clarify.  If you requested that your care partner not be given the details of your procedure findings, then the procedure report has been included in a sealed envelope for you to review at your convenience later.  YOU SHOULD EXPECT: Some feelings of bloating in the abdomen. Passage of more gas than usual.  Walking can help get rid of the air that was put into your GI tract during the procedure and reduce the bloating. If you had a lower endoscopy (such as a colonoscopy or flexible sigmoidoscopy) you may notice spotting of blood in your stool or on the toilet paper. If you underwent a bowel prep for your procedure, you may not have a normal bowel movement for a few days.  Please Note:  You might notice some irritation and congestion in your nose or some drainage.  This is from the oxygen used during your procedure.  There is no need for concern and it should clear up in a day or so.  SYMPTOMS TO REPORT IMMEDIATELY:   Following lower endoscopy (colonoscopy or flexible sigmoidoscopy):  Excessive amounts of blood in the stool  Significant tenderness or worsening of abdominal pains  Swelling of the abdomen that is new, acute  Fever of 100F or higher   For urgent or emergent issues, a gastroenterologist can be reached at any hour by calling [phone].   DIET:  We do recommend a small meal at first, but then you may proceed to your regular diet.  Drink plenty of fluids but you should avoid alcoholic beverages for 24 hours.  ACTIVITY:  You should plan to take it easy for the rest of today and you should NOT DRIVE or use heavy machinery until tomorrow (because of the sedation medicines used during the test).     FOLLOW UP: Our staff will call the number listed on your records the next business day following your procedure to check on you and address any questions or concerns that you may have regarding the information given to you following your procedure. If we do not reach you, we will leave a message.  However, if you are feeling well and you are not experiencing any problems, there is no need to return our call.  We will assume that you have returned to your regular daily activities without incident.  If any biopsies were taken you will be contacted by phone or by letter within the next 1-3 weeks.  Please call us at [phone] if you have not heard about the biopsies in 3 weeks.    SIGNATURES/CONFIDENTIALITY: You and/or your care partner have signed paperwork which will be entered into your electronic medical record.  These signatures attest to the fact that that the information above on your After Visit Summary has been reviewed and is understood.  Full responsibility of the confidentiality of this discharge information lies with you and/or your care-partner.  Polyps, diverticulosis, high fiber diet-handouts given  Repeat colonoscopy in 3 years 2020.

## 2015-12-09 ENCOUNTER — Encounter (HOSPITAL_COMMUNITY): Payer: Self-pay

## 2015-12-09 ENCOUNTER — Telehealth: Payer: Self-pay

## 2015-12-09 NOTE — Telephone Encounter (Signed)
  Follow up Call-  Call back number 12/08/2015  Post procedure Call Back phone  # [phone]  Permission to leave phone message Yes     Patient questions:  Do you have a fever, pain , or abdominal swelling? No. Pain Score  0 *  Have you tolerated food without any problems? Yes.    Have you been able to return to your normal activities? Yes.    Do you have any questions about your discharge instructions: Diet   No. Medications  No. Follow up visit  No.  Do you have questions or concerns about your Care? No.  Actions: * If pain score is 4 or above: No action needed, pain <4.

## 2015-12-11 ENCOUNTER — Other Ambulatory Visit: Payer: Self-pay | Admitting: Internal Medicine

## 2015-12-11 NOTE — Progress Notes (Signed)
DISCONTINUE ON PATHWAY REGIMEN - Non-Small Cell Lung  [id number]: Carboplatin AUC=5 + Pemetrexed 500 mg/m2 + Bevacizumab 15 mg/kg q21 Days x 4 Cycles   A cycle is every 21 days:     Carboplatin (Paraplatin(R)) AUC=5 in 250 mL NS IV over 1 hour Dose Mod: None     Pemetrexed (Alimta(R)) 500 mg/m2 in 100 mL NS IV over 10 minutes, manufacturer recommends not administering to patients with CrCl < 45 mL/min Dose Mod: None     Bevacizumab (Avastin(R)) 15 mg/kg in 100 mL NS IV over 90 minutes first infusion, 60 minutes second infusion and 30 minutes all subsequent infusions if tolerated Dose Mod: None Additional Orders: * All AUC calculations intended to be used in [employer] formula Note: Patient to receive the following prior to the initiation of therapy: 1) Dexamethasone 4 mg orally twice daily x 6 doses.  First dose 24 hours before chemotherapy. 2) Folic acid >= 016 mcg orally daily.  First dose at least 5 days prior to the first dose of pemetrexed. 3) Vitamin B12 1,000 mcg intramuscularly every 9 weeks.  First dose at least 5 days prior to the first dose of pemetrexed.  **Always confirm dose/schedule in your pharmacy ordering system**    REASON: Other Reason PRIOR TREATMENT: [id number]: Carboplatin AUC=5 + Pemetrexed 500 mg/m2 + Bevacizumab 15 mg/kg q21 Days x 4 Cycles TREATMENT RESPONSE: Unable to Evaluate  START ON PATHWAY REGIMEN - Non-Small Cell Lung  [id number]: Pembrolizumab 200 mg q21 Days Until Disease Progression, Unacceptable Toxicity, or up to 24 Months   A cycle is 21 days:     Pembrolizumab [facility])) 200 mg flat dose in 50 mL NS IV over 30 minutes every 21 days.  Inline filter required (low-protein binding) Dose Mod: None Additional Orders: Severe immune-mediated reactions can occur. See prescribing information for more details and required immediate management with steroids. Monitor thyroid, renal, liver function tests, glucose, and sodium at baseline and before each  dose of  pembrolizumab. Ref: Keytruda(R) (pembrolizumab) prescribing information, 2016.  **Always confirm dose/schedule in your pharmacy ordering system**    Patient Characteristics: Stage IV Metastatic, Non Squamous, Initial Chemotherapy/Immunotherapy, PS = 0, 1, PD-L1 Expression Positive  >= 50% (TPS) AJCC M Stage: 1 AJCC N Stage: 2 AJCC T Stage: 1b Current Disease Status: Distant Metastases AJCC Stage Grouping: IV Histology: Non Squamous Cell ROS1 Rearrangement Status: Awaiting Test Results T790M Mutation Status: Not Applicable - EGFR Mutation Negative/Unknown Other Mutations/Biomarkers: No Other Actionable Mutations PD-L1 Expression Status: PD-L1 Positive >= 50% (TPS) Chemotherapy/Immunotherapy LOT: Initial Chemotherapy/Immunotherapy Molecular Targeted Therapy: Not Appropriate ALK Translocation Status: Awaiting Test Results Would you be surprised if this patient died  in the next year? I would NOT be surprised if this patient died in the next year EGFR Mutation Status: Awaiting Test Results BRAF V600E Mutation Status: Awaiting Test Results Performance Status: PS = 0, 1  Intent of Therapy: Non-Curative / Palliative Intent, Discussed with Patient

## 2015-12-13 ENCOUNTER — Telehealth: Payer: Self-pay | Admitting: *Deleted

## 2015-12-13 NOTE — Telephone Encounter (Signed)
Call from Arbie Cookey, PharmD advised treatment pushed out 1 week will allow Keytruda to be paid for by company as pt is medicaid potentional  (no insurance) and will have to pay out of pocket for the drug. Reviewed with MD, who agreed pt to start [place] next week. Arbie Cookey will s/w with Shauna in Financial and have pt bring in proof of finances tomorrow.

## 2015-12-14 ENCOUNTER — Encounter: Payer: Self-pay | Admitting: Internal Medicine

## 2015-12-14 ENCOUNTER — Ambulatory Visit: Payer: Self-pay

## 2015-12-14 ENCOUNTER — Other Ambulatory Visit (HOSPITAL_BASED_OUTPATIENT_CLINIC_OR_DEPARTMENT_OTHER): Payer: Self-pay

## 2015-12-14 ENCOUNTER — Ambulatory Visit (HOSPITAL_BASED_OUTPATIENT_CLINIC_OR_DEPARTMENT_OTHER): Payer: Self-pay

## 2015-12-14 DIAGNOSIS — C3491 Malignant neoplasm of unspecified part of right bronchus or lung: Secondary | ICD-10-CM

## 2015-12-14 DIAGNOSIS — C3431 Malignant neoplasm of lower lobe, right bronchus or lung: Secondary | ICD-10-CM

## 2015-12-14 LAB — COMPREHENSIVE METABOLIC PANEL
ALT: 16 U/L (ref 0–55)
AST: 31 U/L (ref 5–34)
Albumin: 3.7 g/dL (ref 3.5–5.0)
Alkaline Phosphatase: 286 U/L — ABNORMAL HIGH (ref 40–150)
Anion Gap: 11 mEq/L (ref 3–11)
BUN: 14.9 mg/dL (ref 7.0–26.0)
CHLORIDE: 102 meq/L (ref 98–109)
CO2: 23 mEq/L (ref 22–29)
Calcium: 10.1 mg/dL (ref 8.4–10.4)
Creatinine: 1.1 mg/dL (ref 0.6–1.1)
EGFR: 61 mL/min/{1.73_m2} — ABNORMAL LOW (ref >90)
GLUCOSE: 108 mg/dL (ref 70–140)
POTASSIUM: 4.7 meq/L (ref 3.5–5.1)
SODIUM: 137 meq/L (ref 136–145)
Total Bilirubin: 0.29 mg/dL (ref 0.20–1.20)
Total Protein: 9.2 g/dL — ABNORMAL HIGH (ref 6.4–8.3)

## 2015-12-14 LAB — CBC WITH DIFFERENTIAL/PLATELET
BASO%: 0 % (ref 0.0–2.0)
BASOS ABS: 0 10*3/uL (ref 0.0–0.1)
EOS ABS: 0 10*3/uL (ref 0.0–0.5)
EOS%: 0 % (ref 0.0–7.0)
HEMATOCRIT: 36.1 % (ref 34.8–46.6)
HEMOGLOBIN: 12.7 g/dL (ref 11.6–15.9)
LYMPH#: 1.1 10*3/uL (ref 0.9–3.3)
LYMPH%: 12 % — ABNORMAL LOW (ref 14.0–49.7)
MCH: 28.5 pg (ref 25.1–34.0)
MCHC: 35.2 g/dL (ref 31.5–36.0)
MCV: 80.9 fL (ref 79.5–101.0)
MONO#: 0.2 10*3/uL (ref 0.1–0.9)
MONO%: 1.9 % (ref 0.0–14.0)
NEUT%: 86.1 % — ABNORMAL HIGH (ref 38.4–76.8)
NEUTROS ABS: 8.1 10*3/uL — AB (ref 1.5–6.5)
Platelets: 307 10*3/uL (ref 145–400)
RBC: 4.46 10*6/uL (ref 3.70–5.45)
RDW: 14 % (ref 11.2–14.5)
WBC: 9.4 10*3/uL (ref 3.9–10.3)

## 2015-12-14 MED ORDER — SODIUM CHLORIDE 0.9 % IV SOLN: 200.0000 mg | Freq: Once | INTRAVENOUS | Status: DC

## 2015-12-14 MED ORDER — SODIUM CHLORIDE 0.9 % IV SOLN: Freq: Once | INTRAVENOUS | Status: DC

## 2015-12-14 NOTE — Progress Notes (Signed)
Patient came in to sign application for Keytruda. I spoke with patient yesterday afternoon who states she was not at her home to access her proof of income to bring in today. She verbally gave me her social security amount and I used that to complete application. She also had a letter from [facility] showing a list of what is needed to complete her application from her DSS worker. Went over this with patient and advised her to submit information to her worker by the due date and sign and return the letter if extra time is needed to obtain documentation. Patient signed grant application. Patient also completed ACS application. I faxed to [place]. Fax received ok per confirmation sheet. Patient approved for one-time $400 [place]. I explained that she may want to reserve these funds for oral medications written by the doctor here which she can use the grant for at our pharmacy and gas cards. Patient verbalized understanding. I also provided patient with an expense sheet of other possible uses. Patient has a copy of the award as well as our outpatient pharmacy information. Patient has my card for any additional financial questions or concerns. Keytruda application placed on doctor's desk for signature and return to me.

## 2015-12-15 ENCOUNTER — Encounter: Payer: Self-pay | Admitting: Internal Medicine

## 2015-12-15 NOTE — Progress Notes (Signed)
Received signed physician form from RN for [employer] for [employer]. Faxed to [facility]. Fax received ok per confirmation sheet.

## 2015-12-16 ENCOUNTER — Encounter: Payer: Self-pay | Admitting: Internal Medicine

## 2015-12-16 NOTE — Progress Notes (Signed)
Obtained clinical answers to questions from RN and doctor and doctor's signature as well as rx information. Faxed to [facility]. Fax received ok per confirmation sheet.

## 2015-12-19 ENCOUNTER — Encounter: Payer: Self-pay | Admitting: Internal Medicine

## 2015-12-20 ENCOUNTER — Telehealth: Payer: Self-pay | Admitting: Medical Oncology

## 2015-12-20 ENCOUNTER — Encounter: Payer: Self-pay | Admitting: Internal Medicine

## 2015-12-20 NOTE — Progress Notes (Signed)
Faxed RX form to [employer] Patient Assistance Program at [phone]. Fax received ok per confirmation sheet. Called [employer] to follow up per shipping instructions. Pamala Hurry states they haven't received the rx via fax or it may not have been attached yet. She states she will call me once she receives it or if she doesn't receive it. Being that they overnight the drug, it can't be called in no more than 5-7 days before it will be given. She also states that the treatment date can be moved up earlier if need be being that they can overnight the drug. I will relay this information to the RN to relay to the doctor.    The phone number to request shipping for Beryle Flock through [place] Patient Assistance Program is [phone] option 2.

## 2015-12-20 NOTE — Progress Notes (Signed)
Approval letter for Beryle Flock through [employer] Patient Assistance Program was placed on my desk dated 12/19/2015. Patient approved through 02/26/2016 for free Keytruda through this date. A new enrollment form and a valid prescription will be required for re-enrollment. I placed a copy of the letter and prescription to be scanned in chart by HIM. Email sent to drug replacement communication team.       [employer] Patient Assistance Program case number is [id number].

## 2015-12-20 NOTE — Telephone Encounter (Signed)
PT approved for [place] . Form completed and sent back to prior auth specialist.

## 2015-12-21 ENCOUNTER — Other Ambulatory Visit (HOSPITAL_BASED_OUTPATIENT_CLINIC_OR_DEPARTMENT_OTHER): Payer: Self-pay

## 2015-12-21 ENCOUNTER — Telehealth: Payer: Self-pay | Admitting: Internal Medicine

## 2015-12-21 ENCOUNTER — Encounter: Payer: Self-pay | Admitting: Internal Medicine

## 2015-12-21 ENCOUNTER — Ambulatory Visit (HOSPITAL_BASED_OUTPATIENT_CLINIC_OR_DEPARTMENT_OTHER): Payer: Self-pay

## 2015-12-21 ENCOUNTER — Ambulatory Visit (HOSPITAL_BASED_OUTPATIENT_CLINIC_OR_DEPARTMENT_OTHER): Payer: Self-pay | Admitting: Internal Medicine

## 2015-12-21 VITALS — BP 156/62 | HR 80 | Temp 97.9°F | Resp 18 | Ht 63.0 in | Wt 110.8 lb

## 2015-12-21 DIAGNOSIS — Z79899 Other long term (current) drug therapy: Secondary | ICD-10-CM

## 2015-12-21 DIAGNOSIS — C3491 Malignant neoplasm of unspecified part of right bronchus or lung: Secondary | ICD-10-CM

## 2015-12-21 DIAGNOSIS — C3431 Malignant neoplasm of lower lobe, right bronchus or lung: Secondary | ICD-10-CM

## 2015-12-21 DIAGNOSIS — Z5112 Encounter for antineoplastic immunotherapy: Secondary | ICD-10-CM

## 2015-12-21 LAB — CBC WITH DIFFERENTIAL/PLATELET
BASO%: 0 % (ref 0.0–2.0)
Basophils Absolute: 0 10*3/uL (ref 0.0–0.1)
EOS%: 0 % (ref 0.0–7.0)
Eosinophils Absolute: 0 10*3/uL (ref 0.0–0.5)
HEMATOCRIT: 36.2 % (ref 34.8–46.6)
HEMOGLOBIN: 12.7 g/dL (ref 11.6–15.9)
LYMPH#: 1.3 10*3/uL (ref 0.9–3.3)
LYMPH%: 13.8 % — ABNORMAL LOW (ref 14.0–49.7)
MCH: 28.5 pg (ref 25.1–34.0)
MCHC: 35.1 g/dL (ref 31.5–36.0)
MCV: 81.3 fL (ref 79.5–101.0)
MONO#: 0.3 10*3/uL (ref 0.1–0.9)
MONO%: 3.3 % (ref 0.0–14.0)
NEUT%: 82.9 % — ABNORMAL HIGH (ref 38.4–76.8)
NEUTROS ABS: 7.8 10*3/uL — AB (ref 1.5–6.5)
PLATELETS: 286 10*3/uL (ref 145–400)
RBC: 4.45 10*6/uL (ref 3.70–5.45)
RDW: 13.9 % (ref 11.2–14.5)
WBC: 9.4 10*3/uL (ref 3.9–10.3)

## 2015-12-21 LAB — COMPREHENSIVE METABOLIC PANEL
ALBUMIN: 3.5 g/dL (ref 3.5–5.0)
ALT: 27 U/L (ref 0–55)
AST: 32 U/L (ref 5–34)
Alkaline Phosphatase: 309 U/L — ABNORMAL HIGH (ref 40–150)
Anion Gap: 9 mEq/L (ref 3–11)
BILIRUBIN TOTAL: 0.29 mg/dL (ref 0.20–1.20)
BUN: 19.9 mg/dL (ref 7.0–26.0)
CALCIUM: 9.9 mg/dL (ref 8.4–10.4)
CHLORIDE: 104 meq/L (ref 98–109)
CO2: 25 mEq/L (ref 22–29)
CREATININE: 1.1 mg/dL (ref 0.6–1.1)
EGFR: 61 mL/min/{1.73_m2} — ABNORMAL LOW (ref >90)
Glucose: 191 mg/dl — ABNORMAL HIGH (ref 70–140)
Potassium: 4.7 mEq/L (ref 3.5–5.1)
Sodium: 138 mEq/L (ref 136–145)
TOTAL PROTEIN: 8.7 g/dL — AB (ref 6.4–8.3)

## 2015-12-21 LAB — TSH: TSH: 5.302 m(IU)/L — ABNORMAL HIGH (ref 0.308–3.960)

## 2015-12-21 MED ORDER — SODIUM CHLORIDE 0.9 % IV SOLN
Freq: Once | INTRAVENOUS | Status: AC
  Administered 2015-12-21: 14:00:00 via INTRAVENOUS

## 2015-12-21 MED ORDER — SODIUM CHLORIDE 0.9 % IV SOLN
200.0000 mg | Freq: Once | INTRAVENOUS | Status: AC
  Administered 2015-12-21: 200 mg via INTRAVENOUS
  Filled 2015-12-21: qty 8

## 2015-12-21 NOTE — Progress Notes (Signed)
[place] [phone]) [phone]   [phone]) [phone] Multidisciplinary thoracic oncology clinic  OFFICE PROGRESS NOTE  No PCP Per Patient No address on file  DIAGNOSIS: Stage IV (T1b, N2, M1b) non-small cell lung cancer, adenocarcinoma with negative EGFR, ALK, ROS 1 and BRAF mutations and positive PDL 1 expression (99%), presented with hypermetabolic right lower lobe nodule with metastatic right hilar lymphadenopathy and potentially subcarinal lymphadenopathy as well as suspicious single hypermetabolic focus in the right acetabular region. Genomic Alterations Identified? NF1 G371f*9 ARID1B [id number] PBRM1 [id number] TP53 [id number] Additional Findings? Microsatellite status MS-Stable Tumor Mutation Burden TMB-Intermediate; 7 Muts/Mb Additional Disease-relevant Genes with No Reportable Alterations Identified? EGFR KRAS ALK BRAF MET ERBB2 RET ROS1  PRIOR THERAPY: None.   CURRENT THERAPY: First-line immunotherapy with Ketruda (pembrolizumab) 200 mg IV every 3 weeks. First dose 12/21/2015.  INTERVAL HISTORY: HeleAlazay Leichty3. female returns to the clinic today for accompanied by her sister, daughter and son. The patient is feeling fine today with no specific complaints except for fatigue. She denied having any significant chest pain or shortness of breath. She has no cough or hemoptysis. She denied having any significant weight loss or night sweats. She has no fever or chills. Recent molecular studies were negative for actionable mutations but it showed PDL 1 expression is 99%. She was supposed to start systemic chemotherapy was carboplatin, Alimta and Avastin last week but in view of the new findings I change in her treatment to KetrHungarymbrolizumab) and she is here today for evaluation and discussion of this option.  MEDICAL HISTORY: Past Medical History:  Diagnosis Date  . Adenocarcinoma of right lung, stage 4 (HCC)[place]/05/2015  . Complication of  anesthesia    hard to wake up  . COPD (chronic obstructive pulmonary disease) (HCC)[place]. Eczema   . Encounter for antineoplastic chemotherapy 11/30/2015  . Heart murmur    told at age 46 b41 never has had any problems  . Lung mass   . Pneumonia 09/2015    ALLERGIES:  is allergic to penicillins.  MEDICATIONS:  Current Outpatient Prescriptions  Medication Sig Dispense Refill  . aspirin 325 MG tablet Take 325 mg by mouth daily.    . deMarland Kitchenamethasone (DECADRON) 4 MG tablet 4 mg by mouth twice a day the day before, day of and day after the chemotherapy every 3 weeks 40 tablet 1  . folic acid (FOLVITE) 1 MG tablet Take 1 tablet (1 mg total) by mouth daily. 30 tablet 4  . Multiple Vitamin (MULTIVITAMIN) tablet Take 1 tablet by mouth daily.    . prochlorperazine (COMPAZINE) 10 MG tablet Take 1 tablet (10 mg total) by mouth every 6 (six) hours as needed for nausea or vomiting. 30 tablet 0   Current Facility-Administered Medications  Medication Dose Route Frequency Provider Last Rate Last Dose  . 0.9 %  sodium chloride infusion  500 mL Intravenous Continuous JohnIrene Shipper        SURGICAL HISTORY:  Past Surgical History:  Procedure Laterality Date  . ABDOMINAL HYSTERECTOMY    . OOPHORECTOMY  1996   tumor removed from right ovary  . TONSILLECTOMY    . VIDEO BRONCHOSCOPY WITH ENDOBRONCHIAL NAVIGATION N/A 11/18/2015   Procedure: VIDEO BRONCHOSCOPY WITH ENDOBRONCHIAL NAVIGATION;  Surgeon: StevMelrose Nakayama;  [facility] [place] [place];  Laterality: N/A;  . VIDEO BRONCHOSCOPY WITH ENDOBRONCHIAL ULTRASOUND N/A 11/18/2015   Procedure: VIDEO BRONCHOSCOPY WITH ENDOBRONCHIAL ULTRASOUND;  Surgeon: StevRevonda Standard  Roxan Hockey, MD;  Location: [place];  Service: Thoracic;  Laterality: N/A;    REVIEW OF SYSTEMS:  Constitutional: positive for fatigue Eyes: negative Ears, nose, mouth, throat, and face: negative Respiratory: positive for dyspnea on exertion Cardiovascular:  negative Gastrointestinal: negative Genitourinary:negative Integument/breast: negative Hematologic/lymphatic: negative Musculoskeletal:negative Neurological: negative Behavioral/Psych: negative Endocrine: negative Allergic/Immunologic: negative   PHYSICAL EXAMINATION: General appearance: alert, cooperative, fatigued and no distress Head: Normocephalic, without obvious abnormality, atraumatic Neck: no adenopathy, no carotid bruit, supple, symmetrical, trachea midline and thyroid not enlarged, symmetric, no tenderness/mass/nodules Lymph nodes: Cervical, supraclavicular, and axillary nodes normal. Resp: clear to auscultation bilaterally Back: symmetric, no curvature. ROM normal. No CVA tenderness. Cardio: regular rate and rhythm, S1, S2 normal, no murmur, click, rub or gallop GI: soft, non-tender; bowel sounds normal; no masses,  no organomegaly Extremities: extremities normal, atraumatic, no cyanosis or edema Neurologic: Alert and oriented X 3, normal strength and tone. Normal symmetric reflexes. Normal coordination and gait  ECOG PERFORMANCE STATUS: 1 - Symptomatic but completely ambulatory  There were no vitals taken for this visit.  LABORATORY DATA: Lab Results  Component Value Date   WBC 9.4 12/21/2015   HGB 12.7 12/21/2015   HCT 36.2 12/21/2015   MCV 81.3 12/21/2015   PLT 286 12/21/2015      Chemistry      Component Value Date/Time   NA 137 12/14/2015 1019   K 4.7 12/14/2015 1019   CL 103 11/15/2015 1512   CO2 23 12/14/2015 1019   BUN 14.9 12/14/2015 1019   CREATININE 1.1 12/14/2015 1019      Component Value Date/Time   CALCIUM 10.1 12/14/2015 1019   ALKPHOS 286 (H) 12/14/2015 1019   AST 31 12/14/2015 1019   ALT 16 12/14/2015 1019   BILITOT 0.29 12/14/2015 1019       RADIOGRAPHIC STUDIES: No results found.  ASSESSMENT AND PLAN: This is a very pleasant 65 years old African-American female recently diagnosed with stage IV non-small cell lung cancer,  adenocarcinoma. Unfortunately MRI of the pelvis showed highly suspicious metastatic lesion in the right acetabular area. Molecular studies showed no actionable mutations but PDL 1 expression was 99%. I had a lengthy discussion with the patient and her family today about her current condition and treatment options. I recommended for the patient to change her treatment to immunotherapy with Ketruda (pembrolizumab) as a single agent 200 mg IV every 3 weeks. Her treatment was approved and she is expected to start the first cycle of this treatment today. I discussed with the patient and her family the adverse effect of this treatment including but not limited to immune mediated skin rash, diarrhea, inflammation of the lung, kidney, liver as well as thyroid or other endocrine dysfunction. She will proceed with the treatment later today. The patient would come back for follow-up visit in 3 weeks for evaluation before starting cycle #2. She was advised to call immediately if she has any concerning symptoms in the interval.  The patient voices understanding of current disease status and treatment options and is in agreement with the current care plan.  All questions were answered. The patient knows to call the clinic with any problems, questions or concerns. We can certainly see the patient much sooner if necessary.  I spent 15 minutes counseling the patient face to face. The total time spent in the appointment was 25 minutes.  Disclaimer: This note was dictated with voice recognition software. Similar sounding words can inadvertently be transcribed and may not be corrected upon  review.

## 2015-12-21 NOTE — Patient Instructions (Signed)
[facility] Discharge Instructions for Patients Receiving Chemotherapy  Today you received the following chemotherapy agent(s) keytruda.  To help prevent nausea and vomiting after your treatment, we encourage you to take your nausea medication as directed by your MD.   If you develop nausea and vomiting that is not controlled by your nausea medication, call the clinic.   BELOW ARE SYMPTOMS THAT SHOULD BE REPORTED IMMEDIATELY:  *FEVER GREATER THAN 100.5 F  *CHILLS WITH OR WITHOUT FEVER  NAUSEA AND VOMITING THAT IS NOT CONTROLLED WITH YOUR NAUSEA MEDICATION  *UNUSUAL SHORTNESS OF BREATH  *UNUSUAL BRUISING OR BLEEDING  TENDERNESS IN MOUTH AND THROAT WITH OR WITHOUT PRESENCE OF ULCERS  *URINARY PROBLEMS  *BOWEL PROBLEMS  UNUSUAL RASH Items with * indicate a potential emergency and should be followed up as soon as possible.  Feel free to call the clinic you have any questions or concerns. The clinic phone number is [phone].  Please show the [facility] at check-in to the Emergency Department and triage nurse.    Pembrolizumab injection What is this medicine? PEMBROLIZUMAB (pem broe liz ue mab) is a monoclonal antibody. It is used to treat melanoma and non-small cell lung cancer. This medicine may be used for other purposes; ask your health care provider or pharmacist if you have questions. What should I tell my health care provider before I take this medicine? They need to know if you have any of these conditions: -diabetes -immune system problems -inflammatory bowel disease -liver disease -lung or breathing disease -lupus -an unusual or allergic reaction to pembrolizumab, other medicines, foods, dyes, or preservatives -pregnant or trying to get pregnant -breast-feeding How should I use this medicine? This medicine is for infusion into a vein. It is given by a health care professional in a hospital or clinic setting. A special MedGuide will be  given to you before each treatment. Be sure to read this information carefully each time. Talk to your pediatrician regarding the use of this medicine in children. Special care may be needed. Overdosage: If you think you have taken too much of this medicine contact a poison control center or emergency room at once. NOTE: This medicine is only for you. Do not share this medicine with others. What if I miss a dose? It is important not to miss your dose. Call your doctor or health care professional if you are unable to keep an appointment. What may interact with this medicine? Interactions have not been studied. Give your health care provider a list of all the medicines, herbs, non-prescription drugs, or dietary supplements you use. Also tell them if you smoke, drink alcohol, or use illegal drugs. Some items may interact with your medicine. This list may not describe all possible interactions. Give your health care provider a list of all the medicines, herbs, non-prescription drugs, or dietary supplements you use. Also tell them if you smoke, drink alcohol, or use illegal drugs. Some items may interact with your medicine. What should I watch for while using this medicine? Your condition will be monitored carefully while you are receiving this medicine. You may need blood work done while you are taking this medicine. Do not become pregnant while taking this medicine or for 4 months after stopping it. Women should inform their doctor if they wish to become pregnant or think they might be pregnant. There is a potential for serious side effects to an unborn child. Talk to your health care professional or pharmacist for more information. Do  not breast-feed an infant while taking this medicine or for 4 months after the last dose. What side effects may I notice from receiving this medicine? Side effects that you should report to your doctor or health care professional as soon as possible: -allergic reactions  like skin rash, itching or hives, swelling of the face, lips, or tongue -bloody or black, tarry stools -breathing problems -change in the amount of urine -changes in vision -chest pain -chills -dark urine -dizziness or feeling faint or lightheaded -fast or irregular heartbeat -fever -flushing -hair loss -muscle pain -muscle weakness -persistent headache -signs and symptoms of high blood sugar such as dizziness; dry mouth; dry skin; fruity breath; nausea; stomach pain; increased hunger or thirst; increased urination -signs and symptoms of liver injury like dark urine, light-colored stools, loss of appetite, nausea, right upper belly pain, yellowing of the eyes or skin -stomach pain -weight loss Side effects that usually do not require medical attention (Report these to your doctor or health care professional if they continue or are bothersome.):constipation -cough -diarrhea -joint pain -tiredness This list may not describe all possible side effects. Call your doctor for medical advice about side effects. You may report side effects to FDA at 1-800-FDA-1088. Where should I keep my medicine? This drug is given in a hospital or clinic and will not be stored at home. NOTE: This sheet is a summary. It may not cover all possible information. If you have questions about this medicine, talk to your doctor, pharmacist, or health care provider.    2016, Elsevier/Gold Standard. (2014-04-13 17:24:19)

## 2015-12-21 NOTE — Telephone Encounter (Signed)
Gave patient avs report and appointments for November and December  °

## 2015-12-28 ENCOUNTER — Other Ambulatory Visit: Payer: Self-pay

## 2015-12-30 ENCOUNTER — Telehealth: Payer: Self-pay | Admitting: *Deleted

## 2015-12-30 NOTE — Telephone Encounter (Signed)
Pt called to say she continues to have pain in her right leg near pelvis. No redness or swelling. Has discussed previously with Dr [name]. Is relieved by aleve. Does not have PCP. Instructed to take aleve over weekend following directions on bottle. If pain increases, go to ED. Will call Dr [name] on Monday to follow up

## 2016-01-04 ENCOUNTER — Other Ambulatory Visit: Payer: Self-pay

## 2016-01-04 ENCOUNTER — Ambulatory Visit: Payer: Self-pay | Admitting: Nurse Practitioner

## 2016-01-04 ENCOUNTER — Ambulatory Visit: Payer: Self-pay

## 2016-01-05 ENCOUNTER — Telehealth: Payer: Self-pay | Admitting: *Deleted

## 2016-01-05 NOTE — Telephone Encounter (Signed)
Patient called stating that she is having right side pain (rating 9/10). Patient states that [facility] already knows about this pain, but she feels like it is getting worse. Patient is only taking Aleve for pain right now.

## 2016-01-10 NOTE — Telephone Encounter (Signed)
Pain is by pelvis bone on right and goes around to her back. She can wait to see [facility] tomorrow and use alleve until then-"it calms it down some".

## 2016-01-11 ENCOUNTER — Ambulatory Visit (HOSPITAL_BASED_OUTPATIENT_CLINIC_OR_DEPARTMENT_OTHER): Payer: Self-pay

## 2016-01-11 ENCOUNTER — Other Ambulatory Visit: Payer: Self-pay

## 2016-01-11 ENCOUNTER — Other Ambulatory Visit (HOSPITAL_BASED_OUTPATIENT_CLINIC_OR_DEPARTMENT_OTHER): Payer: Self-pay

## 2016-01-11 ENCOUNTER — Ambulatory Visit (HOSPITAL_BASED_OUTPATIENT_CLINIC_OR_DEPARTMENT_OTHER): Payer: Self-pay | Admitting: Internal Medicine

## 2016-01-11 ENCOUNTER — Encounter: Payer: Self-pay | Admitting: Internal Medicine

## 2016-01-11 VITALS — BP 129/50 | HR 79 | Temp 98.4°F | Resp 18 | Ht 63.0 in | Wt 107.8 lb

## 2016-01-11 DIAGNOSIS — C3431 Malignant neoplasm of lower lobe, right bronchus or lung: Secondary | ICD-10-CM

## 2016-01-11 DIAGNOSIS — Z79899 Other long term (current) drug therapy: Secondary | ICD-10-CM

## 2016-01-11 DIAGNOSIS — C3491 Malignant neoplasm of unspecified part of right bronchus or lung: Secondary | ICD-10-CM

## 2016-01-11 DIAGNOSIS — C7951 Secondary malignant neoplasm of bone: Secondary | ICD-10-CM

## 2016-01-11 DIAGNOSIS — Z5112 Encounter for antineoplastic immunotherapy: Secondary | ICD-10-CM

## 2016-01-11 LAB — COMPREHENSIVE METABOLIC PANEL
ALK PHOS: 576 U/L — AB (ref 40–150)
ALT: 54 U/L (ref 0–55)
ANION GAP: 10 meq/L (ref 3–11)
AST: 72 U/L — ABNORMAL HIGH (ref 5–34)
Albumin: 3.5 g/dL (ref 3.5–5.0)
BUN: 16.5 mg/dL (ref 7.0–26.0)
CALCIUM: 9.6 mg/dL (ref 8.4–10.4)
CHLORIDE: 106 meq/L (ref 98–109)
CO2: 23 mEq/L (ref 22–29)
CREATININE: 1.1 mg/dL (ref 0.6–1.1)
EGFR: 61 mL/min/{1.73_m2} — ABNORMAL LOW (ref >90)
Glucose: 85 mg/dl (ref 70–140)
POTASSIUM: 3.9 meq/L (ref 3.5–5.1)
Sodium: 139 mEq/L (ref 136–145)
Total Bilirubin: 0.33 mg/dL (ref 0.20–1.20)
Total Protein: 8.4 g/dL — ABNORMAL HIGH (ref 6.4–8.3)

## 2016-01-11 LAB — TSH: TSH: 0.545 m[IU]/L (ref 0.308–3.960)

## 2016-01-11 LAB — CBC WITH DIFFERENTIAL/PLATELET
BASO%: 0.6 % (ref 0.0–2.0)
BASOS ABS: 0 10*3/uL (ref 0.0–0.1)
EOS%: 4 % (ref 0.0–7.0)
Eosinophils Absolute: 0.3 10*3/uL (ref 0.0–0.5)
HEMATOCRIT: 35.3 % (ref 34.8–46.6)
HGB: 11.8 g/dL (ref 11.6–15.9)
LYMPH#: 1.7 10*3/uL (ref 0.9–3.3)
LYMPH%: 19.5 % (ref 14.0–49.7)
MCH: 28 pg (ref 25.1–34.0)
MCHC: 33.4 g/dL (ref 31.5–36.0)
MCV: 84 fL (ref 79.5–101.0)
MONO#: 0.5 10*3/uL (ref 0.1–0.9)
MONO%: 5.7 % (ref 0.0–14.0)
NEUT#: 6.1 10*3/uL (ref 1.5–6.5)
NEUT%: 70.2 % (ref 38.4–76.8)
PLATELETS: 284 10*3/uL (ref 145–400)
RBC: 4.2 10*6/uL (ref 3.70–5.45)
RDW: 14.6 % — ABNORMAL HIGH (ref 11.2–14.5)
WBC: 8.7 10*3/uL (ref 3.9–10.3)

## 2016-01-11 MED ORDER — SODIUM CHLORIDE 0.9 % IV SOLN
200.0000 mg | Freq: Once | INTRAVENOUS | Status: AC
  Administered 2016-01-11: 200 mg via INTRAVENOUS
  Filled 2016-01-11: qty 8

## 2016-01-11 MED ORDER — OXYCODONE-ACETAMINOPHEN 5-325 MG PO TABS: 1.0000 | ORAL_TABLET | Freq: Four times a day (QID) | ORAL | 0 refills | Status: DC | PRN

## 2016-01-11 MED ORDER — SODIUM CHLORIDE 0.9 % IV SOLN
Freq: Once | INTRAVENOUS | Status: AC
  Administered 2016-01-11: 12:00:00 via INTRAVENOUS

## 2016-01-11 NOTE — Patient Instructions (Signed)
Brittany Mercado Discharge Instructions for Patients Receiving Chemotherapy  Today you received the following chemotherapy agent(s) keytruda.  To help prevent nausea and vomiting after your treatment, we encourage you to take your nausea medication as directed by your MD.   If you develop nausea and vomiting that is not controlled by your nausea medication, call the clinic.   BELOW ARE SYMPTOMS THAT SHOULD BE REPORTED IMMEDIATELY:  *FEVER GREATER THAN 100.5 F  *CHILLS WITH OR WITHOUT FEVER  NAUSEA AND VOMITING THAT IS NOT CONTROLLED WITH YOUR NAUSEA MEDICATION  *UNUSUAL SHORTNESS OF BREATH  *UNUSUAL BRUISING OR BLEEDING  TENDERNESS IN MOUTH AND THROAT WITH OR WITHOUT PRESENCE OF ULCERS  *URINARY PROBLEMS  *BOWEL PROBLEMS  UNUSUAL RASH Items with * indicate a potential emergency and should be followed up as soon as possible.  Feel free to call the clinic you have any questions or concerns. The clinic phone number is [phone].  Please show the [place] at check-in to the Emergency Department and triage nurse.    Pembrolizumab injection What is this medicine? PEMBROLIZUMAB (pem broe liz ue mab) is a monoclonal antibody. It is used to treat melanoma and non-small cell lung cancer. This medicine may be used for other purposes; ask your health care provider or pharmacist if you have questions. What should I tell my health care provider before I take this medicine? They need to know if you have any of these conditions: -diabetes -immune system problems -inflammatory bowel disease -liver disease -lung or breathing disease -lupus -an unusual or allergic reaction to pembrolizumab, other medicines, foods, dyes, or preservatives -pregnant or trying to get pregnant -breast-feeding How should I use this medicine? This medicine is for infusion into a vein. It is given by a health care professional in a hospital or clinic setting. A special MedGuide will be  given to you before each treatment. Be sure to read this information carefully each time. Talk to your pediatrician regarding the use of this medicine in children. Special care may be needed. Overdosage: If you think you have taken too much of this medicine contact a poison control center or emergency room at once. NOTE: This medicine is only for you. Do not share this medicine with others. What if I miss a dose? It is important not to miss your dose. Call your doctor or health care professional if you are unable to keep an appointment. What may interact with this medicine? Interactions have not been studied. Give your health care provider a list of all the medicines, herbs, non-prescription drugs, or dietary supplements you use. Also tell them if you smoke, drink alcohol, or use illegal drugs. Some items may interact with your medicine. This list may not describe all possible interactions. Give your health care provider a list of all the medicines, herbs, non-prescription drugs, or dietary supplements you use. Also tell them if you smoke, drink alcohol, or use illegal drugs. Some items may interact with your medicine. What should I watch for while using this medicine? Your condition will be monitored carefully while you are receiving this medicine. You may need blood work done while you are taking this medicine. Do not become pregnant while taking this medicine or for 4 months after stopping it. Women should inform their doctor if they wish to become pregnant or think they might be pregnant. There is a potential for serious side effects to an unborn child. Talk to your health care professional or pharmacist for more information. Do  not breast-feed an infant while taking this medicine or for 4 months after the last dose. What side effects may I notice from receiving this medicine? Side effects that you should report to your doctor or health care professional as soon as possible: -allergic reactions  like skin rash, itching or hives, swelling of the face, lips, or tongue -bloody or black, tarry stools -breathing problems -change in the amount of urine -changes in vision -chest pain -chills -dark urine -dizziness or feeling faint or lightheaded -fast or irregular heartbeat -fever -flushing -hair loss -muscle pain -muscle weakness -persistent headache -signs and symptoms of high blood sugar such as dizziness; dry mouth; dry skin; fruity breath; nausea; stomach pain; increased hunger or thirst; increased urination -signs and symptoms of liver injury like dark urine, light-colored stools, loss of appetite, nausea, right upper belly pain, yellowing of the eyes or skin -stomach pain -weight loss Side effects that usually do not require medical attention (Report these to your doctor or health care professional if they continue or are bothersome.):constipation -cough -diarrhea -joint pain -tiredness This list may not describe all possible side effects. Call your doctor for medical advice about side effects. You may report side effects to FDA at 1-800-FDA-1088. Where should I keep my medicine? This drug is given in a hospital or clinic and will not be stored at home. NOTE: This sheet is a summary. It may not cover all possible information. If you have questions about this medicine, talk to your doctor, pharmacist, or health care provider.    2016, Elsevier/Gold Standard. (2014-04-13 17:24:19)

## 2016-01-11 NOTE — Progress Notes (Signed)
[place] [phone]   Fax:(336) [phone] Multidisciplinary thoracic oncology clinic  OFFICE PROGRESS NOTE  No PCP Per Patient No address on file  DIAGNOSIS: Stage IV (T1b, N2, M1b) non-small cell lung cancer, adenocarcinoma with negative EGFR, ALK, ROS 1 and BRAF mutations and positive PDL 1 expression (99%), presented with hypermetabolic right lower lobe nodule with metastatic right hilar lymphadenopathy and potentially subcarinal lymphadenopathy as well as suspicious single hypermetabolic focus in the right acetabular region. Genomic Alterations Identified? NF1 G389f*9 ARID1B [id number] PBRM1 [id number] TP53 P72f37f Additional Findings? Microsatellite status MS-Stable Tumor Mutation Burden TMB-Intermediate; 7 Muts/Mb Additional Disease-relevant Genes with No Reportable Alterations Identified? EGFR KRAS ALK BRAF MET ERBB2 RET ROS1  PRIOR THERAPY: None.   CURRENT THERAPY: First-line immunotherapy with Ketruda (pembrolizumab) 200 mg IV every 3 weeks. First dose 12/21/2015. Status post one cycle.  INTERVAL HISTORY: Brittany Fessely59. female returns to the clinic today for accompanied by her sister and son. The patient was started recently on treatment with immunotherapy with Ketruda (pembrolizumab) status post 1 cycle. She tolerated the first cycle of her treatment well. She complains today of pain in the right hip area. She takes Aleve with no improvement. She denied having any fever or chills. She has no nausea or vomiting. She also has lack of appetite and she lost few pounds since her last visit. She has no chest pain, shortness of breath, cough or hemoptysis. She is here today for evaluation before starting cycle #2 of her immunotherapy.  MEDICAL HISTORY: Past Medical History:  Diagnosis Date  . Adenocarcinoma of right lung, stage 4 (HCC)[place]/05/2015  . Complication of anesthesia    hard to wake up  . COPD (chronic obstructive pulmonary  disease) (HCC)[place]. Eczema   . Encounter for antineoplastic chemotherapy 11/30/2015  . Heart murmur    told at age 68 b33 never has had any problems  . Lung mass   . Pneumonia 09/2015    ALLERGIES:  is allergic to penicillins.  MEDICATIONS:  Current Outpatient Prescriptions  Medication Sig Dispense Refill  . aspirin 325 MG tablet Take 325 mg by mouth daily.    . deMarland Kitchenamethasone (DECADRON) 4 MG tablet 4 mg by mouth twice a day the day before, day of and day after the chemotherapy every 3 weeks 40 tablet 1  . folic acid (FOLVITE) 1 MG tablet Take 1 tablet (1 mg total) by mouth daily. 30 tablet 4  . Multiple Vitamin (MULTIVITAMIN) tablet Take 1 tablet by mouth daily.    . prochlorperazine (COMPAZINE) 10 MG tablet Take 1 tablet (10 mg total) by mouth every 6 (six) hours as needed for nausea or vomiting. (Patient not taking: Reported on 12/21/2015) 30 tablet 0   Current Facility-Administered Medications  Medication Dose Route Frequency Provider Last Rate Last Dose  . 0.9 %  sodium chloride infusion  500 mL Intravenous Continuous JohnIrene Shipper        SURGICAL HISTORY:  Past Surgical History:  Procedure Laterality Date  . ABDOMINAL HYSTERECTOMY    . OOPHORECTOMY  1996   tumor removed from right ovary  . TONSILLECTOMY    . VIDEO BRONCHOSCOPY WITH ENDOBRONCHIAL NAVIGATION N/A 11/18/2015   Procedure: VIDEO BRONCHOSCOPY WITH ENDOBRONCHIAL NAVIGATION;  Surgeon: StevMelrose Nakayama;  [facility];  Laterality: N/A;  . VIDEO BRONCHOSCOPY WITH ENDOBRONCHIAL ULTRASOUND N/A 11/18/2015   Procedure: VIDEO BRONCHOSCOPY WITH ENDOBRONCHIAL ULTRASOUND;  Surgeon: StevMelrose Nakayama  MD;  Location: [facility] OR;  Service: Thoracic;  Laterality: N/A;    REVIEW OF SYSTEMS:  Constitutional: positive for fatigue and weight loss Eyes: negative Ears, nose, mouth, throat, and face: negative Respiratory: negative Cardiovascular: negative Gastrointestinal:  negative Genitourinary:negative Integument/breast: negative Hematologic/lymphatic: negative Musculoskeletal:positive for bone pain Neurological: negative Behavioral/Psych: negative Endocrine: negative Allergic/Immunologic: negative   PHYSICAL EXAMINATION: General appearance: alert, cooperative, fatigued and no distress Head: Normocephalic, without obvious abnormality, atraumatic Neck: no adenopathy, no carotid bruit, supple, symmetrical, trachea midline and thyroid not enlarged, symmetric, no tenderness/mass/nodules Lymph nodes: Cervical, supraclavicular, and axillary nodes normal. Resp: clear to auscultation bilaterally Back: symmetric, no curvature. ROM normal. No CVA tenderness. Cardio: regular rate and rhythm, S1, S2 normal, no murmur, click, rub or gallop GI: soft, non-tender; bowel sounds normal; no masses,  no organomegaly Extremities: extremities normal, atraumatic, no cyanosis or edema Neurologic: Alert and oriented X 3, normal strength and tone. Normal symmetric reflexes. Normal coordination and gait  ECOG PERFORMANCE STATUS: 1 - Symptomatic but completely ambulatory  Blood pressure (!) 129/50, pulse 79, temperature 98.4 F (36.9 C), temperature source Oral, resp. rate 18, height 5' 3" (1.6 m), weight 107 lb 12.8 oz (48.9 kg), SpO2 100 %.  LABORATORY DATA: Lab Results  Component Value Date   WBC 8.7 01/11/2016   HGB 11.8 01/11/2016   HCT 35.3 01/11/2016   MCV 84.0 01/11/2016   PLT 284 01/11/2016      Chemistry      Component Value Date/Time   NA 138 12/21/2015 0817   K 4.7 12/21/2015 0817   CL 103 11/15/2015 1512   CO2 25 12/21/2015 0817   BUN 19.9 12/21/2015 0817   CREATININE 1.1 12/21/2015 0817      Component Value Date/Time   CALCIUM 9.9 12/21/2015 0817   ALKPHOS 309 (H) 12/21/2015 0817   AST 32 12/21/2015 0817   ALT 27 12/21/2015 0817   BILITOT 0.29 12/21/2015 0817       RADIOGRAPHIC STUDIES: No results found.  ASSESSMENT AND PLAN: This is a  very pleasant 65 years old African-American female recently diagnosed with stage IV non-small cell lung cancer, adenocarcinoma. Unfortunately MRI of the pelvis showed highly suspicious metastatic lesion in the right acetabular area. Molecular studies showed no actionable mutations but PDL 1 expression was 99%. The patient is currently undergoing treatment with immunotherapy with Ketruda (pembrolizumab) status post 1 cycle. She tolerated the first cycle of her treatment well with no significant adverse effects. I recommended for her to proceed with cycle #2 today as a scheduled. The patient would come back for follow-up visit in 3 weeks for evaluation before starting cycle #3. For the painful right hip bone metastasis, I referred the patient to radiation oncology for evaluation and palliative radiotherapy to this area. I also started the patient on Percocet 5/325 mg by mouth every 6 hours as needed for pain. For the malnutrition, I referred the patient to the dietitian at the [facility] for evaluation and advice regarding her condition. She was advised to call immediately if she has any concerning symptoms in the interval.  The patient voices understanding of current disease status and treatment options and is in agreement with the current care plan.  All questions were answered. The patient knows to call the clinic with any problems, questions or concerns. We can certainly see the patient much sooner if necessary.  Disclaimer: This note was dictated with voice recognition software. Similar sounding words can inadvertently be transcribed and may not be corrected  upon review.

## 2016-01-13 NOTE — Progress Notes (Signed)
Histology and Location of Primary Cancer:Stage IV (T1b, N2, M1b) non-small cell lung cancer   Location(s) of Symptomatic Metastases:acetabular-inferior pubic ramus junction   Past/Anticipated chemotherapy by medical oncology, if any: First-line immunotherapy with Ketruda (pembrolizumab) 200 mg IV every 3 weeks. First dose 12/21/2015. Status post one cycle.  Pain on a scale of 0-10 is: 10/10 she is currently taking Aleve once a day and then Percocet at night.  If Spine Met(s), symptoms, if any, include:  Bowel/Bladder retention or incontinence (please describe): no  Numbness or weakness in extremities (please describe): yes has weakness in her right leg  Current Decadron regimen, if applicable: none  Ambulatory status? Walker? Wheelchair?: Ambulatory  SAFETY ISSUES:  Prior radiation? no  Pacemaker/ICD? no  Possible current pregnancy? no  Is the patient on methotrexate? no  Current Complaints / other details:  Patient is here with her sister.  She reports having pain in her right groin/leg for a couple of months and said it recently has been a lot worse.  She said the pain begins in her right groin and shots down her left leg.  BP (!) 116/94 (BP Location: Right Arm, Patient Position: Sitting)   Pulse 80   Temp 98.2 F (36.8 C) (Oral)   Ht 5' 3" (1.6 m)   Wt 108 lb (49 kg)   SpO2 100%   BMI 19.13 kg/m    Wt Readings from Last 3 Encounters:  01/16/16 108 lb (49 kg)  01/11/16 107 lb 12.8 oz (48.9 kg)  12/21/15 110 lb 12.8 oz (50.3 kg)     

## 2016-01-16 ENCOUNTER — Encounter: Payer: Self-pay | Admitting: Internal Medicine

## 2016-01-16 ENCOUNTER — Ambulatory Visit
Admission: RE | Admit: 2016-01-16 | Discharge: 2016-01-16 | Disposition: A | Discharge status: Home / Self Care | Payer: Medicaid Other | Source: Ambulatory Visit | Attending: Radiation Oncology | Admitting: Radiation Oncology

## 2016-01-16 ENCOUNTER — Encounter: Payer: Self-pay | Admitting: Radiation Oncology

## 2016-01-16 VITALS — BP 116/94 | HR 80 | Temp 98.2°F | Ht 63.0 in | Wt 108.0 lb

## 2016-01-16 DIAGNOSIS — C3491 Malignant neoplasm of unspecified part of right bronchus or lung: Secondary | ICD-10-CM | POA: Insufficient documentation

## 2016-01-16 DIAGNOSIS — Z880 Allergy status to penicillin: Secondary | ICD-10-CM | POA: Diagnosis not present

## 2016-01-16 DIAGNOSIS — Z510 Encounter for antineoplastic radiation therapy: Secondary | ICD-10-CM | POA: Insufficient documentation

## 2016-01-16 DIAGNOSIS — Z7982 Long term (current) use of aspirin: Secondary | ICD-10-CM | POA: Insufficient documentation

## 2016-01-16 NOTE — Progress Notes (Signed)
Faxed continuation intent enrollment form to [employer] for [employer]. Will need to complete re-enrollment form for 2018.

## 2016-01-16 NOTE — Progress Notes (Signed)
Radiation Oncology         [phone] ________________________________  Name: Brittany Mercado MRN: [id number]  Date: 01/16/2016  DOB: 1950-03-10  Consultation Note  CC: No PCP Per Patient  Curt Bears, MD    ICD-9-CM ICD-10-CM   1. Adenocarcinoma of right lung, stage 4 (HCC) 162.9 C34.91     Diagnosis:   Stage IV (T1b, N2, M1b) non-small cell lung cancer, adenocarcinoma  Narrative:  The patient has a primary diagnosis of Stage IV non-small cell lung cancer. Additionally, a pelvic MRI showed highly suspicious metastatic lesion in the right acetabular area. Molecular studies showed no actionable mutations but PDL 1 expression was 99%.. The patient is being treated with immunotherapy with Keytruda, but her pelvic pain is not improving. The patient presents to Radiation Oncology today to discuss the role for radiation in alleviating some of her pain.  The patient reports pain in her right groin and leg ongoing for a couple of months, however recently it has been worse. She reports the pain begins in her right groin and shoots down her right leg. She rates her pain 10/10 in severity, and is currently taking Aleve once during the day and Percocet at night. The patient's sister is in attendance with her today; she inquired if the patient should be taking Calcium supplements to strengthen her bones.  ALLERGIES:  is allergic to penicillins.  Meds: Current Outpatient Prescriptions  Medication Sig Dispense Refill  . aspirin 325 MG tablet Take 325 mg by mouth daily.    . folic acid (FOLVITE) 1 MG tablet Take 1 tablet (1 mg total) by mouth daily. 30 tablet 4  . Multiple Vitamin (MULTIVITAMIN) tablet Take 1 tablet by mouth daily.    Marland Kitchen oxyCODONE-acetaminophen (PERCOCET/ROXICET) 5-325 MG tablet Take 1 tablet by mouth every 6 (six) hours as needed for severe pain. 40 tablet 0  . dexamethasone (DECADRON) 4 MG tablet 4 mg by mouth twice a day the day before, day of and day after the chemotherapy  every 3 weeks (Patient not taking: Reported on 01/16/2016) 40 tablet 1  . prochlorperazine (COMPAZINE) 10 MG tablet Take 1 tablet (10 mg total) by mouth every 6 (six) hours as needed for nausea or vomiting. (Patient not taking: Reported on 01/16/2016) 30 tablet 0   Current Facility-Administered Medications  Medication Dose Route Frequency Provider Last Rate Last Dose  . 0.9 %  sodium chloride infusion  500 mL Intravenous Continuous Irene Shipper, MD        Physical Findings: The patient is in no acute distress. Patient is alert and oriented.  height is '5\' 3"'$  (1.6 m) and weight is 108 lb (49 kg). Her oral temperature is 98.2 F (36.8 C). Her blood pressure is 116/94 (abnormal) and her pulse is 80. Her oxygen saturation is 100%. .  No significant changes. General: Alert and oriented, in no acute distress HEENT: Head is normocephalic. Extraocular movements are intact. Oropharynx is clear. Neck: Neck is supple, no palpable cervical or supraclavicular lymphadenopathy. Heart: Regular in rate and rhythm with no murmurs, rubs, or gallops. Chest: Clear to auscultation bilaterally, with no rhonchi, wheezes, or rales. Abdomen: Soft, nontender, nondistended, with no rigidity or guarding. Extremities: No cyanosis or edema. Lymphatics: see Neck Exam Skin: No concerning lesions. Musculoskeletal: symmetric strength and muscle tone throughout. Neurologic: Cranial nerves II through XII are grossly intact. No obvious focalities. Speech is fluent. Coordination is intact. Psychiatric: Judgment and insight are intact. Affect is appropriate.  Lab Findings: Lab Results  Component Value Date   WBC 8.7 01/11/2016   HGB 11.8 01/11/2016   HCT 35.3 01/11/2016   MCV 84.0 01/11/2016   PLT 284 01/11/2016    Radiographic Findings: No results found.  Impression: This is a 65 year old female with Stage IV non-small cell lung cancer and metastatic lesion of the acetabulum. She is a good candidate for palliative  radiation treatment to alleviate her severe pain.  Plan: We discussed the risks, benefits, and side effects of radiation treatment. We discussed the logistics of radiation treatments for the patient's education. The patient would like to move forward with radiation treatment. She has been scheduled for CT Simulation 01/17/16. The patient will undergo 10 treatments over the course of 2 weeks.  I advised the patient that she could take Calcium supplements if she wished.  -----------------------------------  Blair Promise, PhD, MD   This document serves as a record of services personally performed by Gery Pray, MD. It was created on his behalf by Maryla Morrow, a trained medical scribe. The creation of this record is based on the scribe's personal observations and the provider's statements to them. This document has been checked and approved by the attending provider.

## 2016-01-17 ENCOUNTER — Ambulatory Visit
Admission: RE | Admit: 2016-01-17 | Discharge: 2016-01-17 | Disposition: A | Discharge status: Home / Self Care | Payer: Medicaid Other | Source: Ambulatory Visit | Attending: Radiation Oncology | Admitting: Radiation Oncology

## 2016-01-17 DIAGNOSIS — C3491 Malignant neoplasm of unspecified part of right bronchus or lung: Secondary | ICD-10-CM

## 2016-01-17 DIAGNOSIS — Z510 Encounter for antineoplastic radiation therapy: Secondary | ICD-10-CM | POA: Diagnosis not present

## 2016-01-17 NOTE — Addendum Note (Signed)
Encounter addended by: Jacqulyn Liner, RN on: 01/17/2016 12:11 PM<BR>    Actions taken: Charge Capture section accepted

## 2016-01-18 ENCOUNTER — Other Ambulatory Visit: Payer: Self-pay

## 2016-01-23 NOTE — Progress Notes (Signed)
  Radiation Oncology         [phone] ________________________________  Name: Brittany Mercado MRN: [id number]  Date: 01/17/2016  DOB: 09-04-1950  SIMULATION AND TREATMENT PLANNING NOTE    ICD-9-CM ICD-10-CM   1. Adenocarcinoma of right lung, stage 4 (HCC) 162.9 C34.91     DIAGNOSIS:  Stage IV (T1b, N2, M1b) non-small cell lung cancer, adenocarcinoma  NARRATIVE:  The patient was brought to the [facility].  Identity was confirmed.  All relevant records and images related to the planned course of therapy were reviewed.  The patient freely provided informed written consent to proceed with treatment after reviewing the details related to the planned course of therapy. The consent form was witnessed and verified by the simulation staff.  Then, the patient was set-up in a stable reproducible  supine position for radiation therapy.  CT images were obtained.  Surface markings were placed.  The CT images were loaded into the planning software.  Then the target and avoidance structures were contoured.  Treatment planning then occurred.  The radiation prescription was entered and confirmed.  Then, I designed and supervised the construction of a total of 4 medically necessary complex treatment devices.  I have requested : 3D Simulation  I have requested a DVH of the following structures: bladder, rectum, GTV.  I have ordered:dose calc.  PLAN:  The patient will receive 30 Gy in 10 fractions.  -----------------------------------  Blair Promise, PhD, MD

## 2016-01-24 DIAGNOSIS — Z510 Encounter for antineoplastic radiation therapy: Secondary | ICD-10-CM | POA: Diagnosis not present

## 2016-01-25 ENCOUNTER — Other Ambulatory Visit: Payer: Self-pay

## 2016-01-25 ENCOUNTER — Ambulatory Visit: Payer: Self-pay

## 2016-01-25 ENCOUNTER — Ambulatory Visit: Payer: Self-pay | Admitting: Internal Medicine

## 2016-01-25 ENCOUNTER — Ambulatory Visit
Admission: RE | Admit: 2016-01-25 | Discharge: 2016-01-25 | Disposition: A | Discharge status: Home / Self Care | Payer: Medicaid Other | Source: Ambulatory Visit | Attending: Radiation Oncology | Admitting: Radiation Oncology

## 2016-01-25 DIAGNOSIS — C3491 Malignant neoplasm of unspecified part of right bronchus or lung: Secondary | ICD-10-CM

## 2016-01-25 DIAGNOSIS — Z510 Encounter for antineoplastic radiation therapy: Secondary | ICD-10-CM | POA: Diagnosis not present

## 2016-01-25 NOTE — Progress Notes (Signed)
  Radiation Oncology         [phone] ________________________________  Name: Brittany Mercado MRN: [id number]  Date: 01/25/2016  DOB: December 16, 1950  Simulation Verification Note    ICD-9-CM ICD-10-CM   1. Adenocarcinoma of right lung, stage 4 (HCC) 162.9 C34.91     Status: outpatient  NARRATIVE: The patient was brought to the treatment unit and placed in the planned treatment position. The clinical setup was verified. Then port films were obtained and uploaded to the radiation oncology medical record software.  The treatment beams were carefully compared against the planned radiation fields. The position location and shape of the radiation fields was reviewed. They targeted volume of tissue appears to be appropriately covered by the radiation beams. Organs at risk appear to be excluded as planned.  Based on my personal review, I approved the simulation verification. The patient's treatment will proceed as planned.  -----------------------------------  Blair Promise, PhD, MD

## 2016-01-26 ENCOUNTER — Ambulatory Visit
Admission: RE | Admit: 2016-01-26 | Discharge: 2016-01-26 | Disposition: A | Discharge status: Home / Self Care | Payer: Medicaid Other | Source: Ambulatory Visit | Attending: Radiation Oncology | Admitting: Radiation Oncology

## 2016-01-26 ENCOUNTER — Ambulatory Visit: Payer: Medicaid Other | Admitting: Radiation Oncology

## 2016-01-26 DIAGNOSIS — Z510 Encounter for antineoplastic radiation therapy: Secondary | ICD-10-CM | POA: Diagnosis not present

## 2016-01-27 ENCOUNTER — Ambulatory Visit
Admission: RE | Admit: 2016-01-27 | Discharge: 2016-01-27 | Disposition: A | Discharge status: Home / Self Care | Payer: Medicaid Other | Source: Ambulatory Visit | Attending: Radiation Oncology | Admitting: Radiation Oncology

## 2016-01-27 DIAGNOSIS — Z510 Encounter for antineoplastic radiation therapy: Secondary | ICD-10-CM | POA: Diagnosis not present

## 2016-01-30 ENCOUNTER — Ambulatory Visit
Admission: RE | Admit: 2016-01-30 | Discharge: 2016-01-30 | Disposition: A | Discharge status: Home / Self Care | Payer: Medicaid Other | Source: Ambulatory Visit | Attending: Radiation Oncology | Admitting: Radiation Oncology

## 2016-01-30 DIAGNOSIS — Z510 Encounter for antineoplastic radiation therapy: Secondary | ICD-10-CM | POA: Diagnosis not present

## 2016-01-31 ENCOUNTER — Ambulatory Visit
Admission: RE | Admit: 2016-01-31 | Discharge: 2016-01-31 | Disposition: A | Discharge status: Home / Self Care | Payer: Self-pay | Source: Ambulatory Visit | Attending: Radiation Oncology | Admitting: Radiation Oncology

## 2016-01-31 ENCOUNTER — Encounter: Payer: Self-pay | Admitting: Radiation Oncology

## 2016-01-31 ENCOUNTER — Ambulatory Visit
Admission: RE | Admit: 2016-01-31 | Discharge: 2016-01-31 | Disposition: A | Discharge status: Home / Self Care | Payer: Medicaid Other | Source: Ambulatory Visit | Attending: Radiation Oncology | Admitting: Radiation Oncology

## 2016-01-31 VITALS — BP 128/73 | HR 78 | Temp 97.8°F | Ht 63.0 in | Wt 108.6 lb

## 2016-01-31 DIAGNOSIS — C3491 Malignant neoplasm of unspecified part of right bronchus or lung: Secondary | ICD-10-CM

## 2016-01-31 DIAGNOSIS — Z510 Encounter for antineoplastic radiation therapy: Secondary | ICD-10-CM | POA: Diagnosis not present

## 2016-01-31 NOTE — Progress Notes (Addendum)
Brittany Mercado has completed 5 fractions to her right pelvis.  She reports having pain at a 5/10 in her right groin area today.  She said her pain is much better.  She takes percocet as needed.  She mentioned that she has a pins and needles feeling in her feet after she has bends over.  She said this started a few days ago.  She denies having fatigue.  BP 128/73 (BP Location: Right Arm, Patient Position: Sitting)   Pulse 78   Temp 97.8 F (36.6 C) (Oral)   Ht '5\' 3"'$  (1.6 m)   Wt 108 lb 9.6 oz (49.3 kg)   SpO2 100%   BMI 19.24 kg/m    Wt Readings from Last 3 Encounters:  01/31/16 108 lb 9.6 oz (49.3 kg)  01/16/16 108 lb (49 kg)  01/11/16 107 lb 12.8 oz (48.9 kg)

## 2016-01-31 NOTE — Progress Notes (Signed)
  Radiation Oncology         [phone] ________________________________  Name: Brittany Mercado MRN: [id number]  Date: 01/31/2016  DOB: 12-31-1950  Weekly Radiation Therapy Management    ICD-9-CM ICD-10-CM   1. Adenocarcinoma of right lung, stage 4 (HCC) 162.9 C34.91      Current Dose: 15 Gy     Planned Dose:  30 Gy  Narrative . . . . . . . . The patient presents for routine under treatment assessment.  The patient has completed 5 fractions to her right pelvis. She denies fatigue. She reports having pain 5/10 in severity in her right groin area today, though the pain is much better than before. She take Percocet as needed. The patient reports pins and needles feeling in her feet when she bends over, which she says started a few days ago.                                   The patient is without complaint.                                 Set-up films were reviewed.                                 The chart was checked. Physical Findings. . .  height is '5\' 3"'$  (1.6 m) and weight is 108 lb 9.6 oz (49.3 kg). Her oral temperature is 97.8 F (36.6 C). Her blood pressure is 128/73 and her pulse is 78. Her oxygen saturation is 100%.  Weight essentially stable.  No significant changes. Cardiopulmonary assessment is negative for acute distress and she exhibits normal effort. Lungs clear to auscultation bilaterally. Impression . . . . . . . The patient is tolerating radiation. Plan . . . . . . . . . . . . Continue treatment as planned.  ________________________________   Blair Promise, PhD, MD  This document serves as a record of services personally performed by Gery Pray, MD. It was created on his behalf by Maryla Morrow, a trained medical scribe. The creation of this record is based on the scribe's personal observations and the provider's statements to them. This document has been checked and approved by the attending provider.

## 2016-02-01 ENCOUNTER — Other Ambulatory Visit: Payer: Self-pay | Admitting: Internal Medicine

## 2016-02-01 ENCOUNTER — Encounter: Payer: Self-pay | Admitting: Internal Medicine

## 2016-02-01 ENCOUNTER — Telehealth: Payer: Self-pay | Admitting: Internal Medicine

## 2016-02-01 ENCOUNTER — Ambulatory Visit (HOSPITAL_BASED_OUTPATIENT_CLINIC_OR_DEPARTMENT_OTHER): Payer: Self-pay | Admitting: Internal Medicine

## 2016-02-01 ENCOUNTER — Other Ambulatory Visit (HOSPITAL_BASED_OUTPATIENT_CLINIC_OR_DEPARTMENT_OTHER): Payer: Self-pay

## 2016-02-01 ENCOUNTER — Telehealth: Payer: Self-pay | Admitting: *Deleted

## 2016-02-01 ENCOUNTER — Ambulatory Visit: Payer: Self-pay | Admitting: Nutrition

## 2016-02-01 ENCOUNTER — Ambulatory Visit (HOSPITAL_BASED_OUTPATIENT_CLINIC_OR_DEPARTMENT_OTHER): Payer: Self-pay

## 2016-02-01 ENCOUNTER — Ambulatory Visit
Admission: RE | Admit: 2016-02-01 | Discharge: 2016-02-01 | Disposition: A | Discharge status: Home / Self Care | Payer: Medicaid Other | Source: Ambulatory Visit | Attending: Radiation Oncology | Admitting: Radiation Oncology

## 2016-02-01 VITALS — BP 154/64 | HR 79 | Temp 97.8°F | Resp 18 | Ht 63.0 in | Wt 108.3 lb

## 2016-02-01 DIAGNOSIS — I10 Essential (primary) hypertension: Secondary | ICD-10-CM

## 2016-02-01 DIAGNOSIS — C3491 Malignant neoplasm of unspecified part of right bronchus or lung: Secondary | ICD-10-CM

## 2016-02-01 DIAGNOSIS — Z5112 Encounter for antineoplastic immunotherapy: Secondary | ICD-10-CM

## 2016-02-01 DIAGNOSIS — C3401 Malignant neoplasm of right main bronchus: Secondary | ICD-10-CM

## 2016-02-01 DIAGNOSIS — Z510 Encounter for antineoplastic radiation therapy: Secondary | ICD-10-CM | POA: Diagnosis not present

## 2016-02-01 DIAGNOSIS — C7951 Secondary malignant neoplasm of bone: Secondary | ICD-10-CM

## 2016-02-01 DIAGNOSIS — Z79899 Other long term (current) drug therapy: Secondary | ICD-10-CM

## 2016-02-01 HISTORY — DX: Essential (primary) hypertension: I10

## 2016-02-01 LAB — CBC WITH DIFFERENTIAL/PLATELET
BASO%: 0.7 % (ref 0.0–2.0)
Basophils Absolute: 0.1 10*3/uL (ref 0.0–0.1)
EOS%: 6.7 % (ref 0.0–7.0)
Eosinophils Absolute: 0.8 10*3/uL — ABNORMAL HIGH (ref 0.0–0.5)
HCT: 38.2 % (ref 34.8–46.6)
HGB: 12.6 g/dL (ref 11.6–15.9)
LYMPH%: 15.7 % (ref 14.0–49.7)
MCH: 27.7 pg (ref 25.1–34.0)
MCHC: 33.1 g/dL (ref 31.5–36.0)
MCV: 83.8 fL (ref 79.5–101.0)
MONO#: 0.5 10*3/uL (ref 0.1–0.9)
MONO%: 4.1 % (ref 0.0–14.0)
NEUT#: 8.2 10*3/uL — ABNORMAL HIGH (ref 1.5–6.5)
NEUT%: 72.8 % (ref 38.4–76.8)
PLATELETS: 308 10*3/uL (ref 145–400)
RBC: 4.56 10*6/uL (ref 3.70–5.45)
RDW: 14.6 % — ABNORMAL HIGH (ref 11.2–14.5)
WBC: 11.3 10*3/uL — ABNORMAL HIGH (ref 3.9–10.3)
lymph#: 1.8 10*3/uL (ref 0.9–3.3)

## 2016-02-01 LAB — COMPREHENSIVE METABOLIC PANEL
ALT: 42 U/L (ref 0–55)
ANION GAP: 11 meq/L (ref 3–11)
AST: 71 U/L — ABNORMAL HIGH (ref 5–34)
Albumin: 3.7 g/dL (ref 3.5–5.0)
Alkaline Phosphatase: 474 U/L — ABNORMAL HIGH (ref 40–150)
BUN: 13.1 mg/dL (ref 7.0–26.0)
CHLORIDE: 103 meq/L (ref 98–109)
CO2: 24 meq/L (ref 22–29)
Calcium: 9.7 mg/dL (ref 8.4–10.4)
Creatinine: 1.3 mg/dL — ABNORMAL HIGH (ref 0.6–1.1)
EGFR: 52 mL/min/{1.73_m2} — AB (ref >90)
Glucose: 103 mg/dl (ref 70–140)
Potassium: 4.1 mEq/L (ref 3.5–5.1)
SODIUM: 138 meq/L (ref 136–145)
Total Bilirubin: 0.3 mg/dL (ref 0.20–1.20)
Total Protein: 9 g/dL — ABNORMAL HIGH (ref 6.4–8.3)

## 2016-02-01 LAB — TSH: TSH: 62.396 m(IU)/L — ABNORMAL HIGH (ref 0.308–3.960)

## 2016-02-01 MED ORDER — SODIUM CHLORIDE 0.9 % IV SOLN
Freq: Once | INTRAVENOUS | Status: AC
  Administered 2016-02-01: 10:00:00 via INTRAVENOUS

## 2016-02-01 MED ORDER — LEVOTHYROXINE SODIUM 50 MCG PO TABS: 50.0000 ug | ORAL_TABLET | Freq: Every day | ORAL | 2 refills | Status: DC

## 2016-02-01 MED ORDER — PEMBROLIZUMAB CHEMO INJECTION 100 MG/4ML
200.0000 mg | Freq: Once | INTRAVENOUS | Status: AC
  Administered 2016-02-01: 200 mg via INTRAVENOUS
  Filled 2016-02-01: qty 8

## 2016-02-01 NOTE — Progress Notes (Signed)
Patient came in inquiring about a letter received from Social Security disability. Patient advised by [address] to call the number on the letter. Patient also brought in her proof of income and states someone needed it. I have a copy for whomever may need. Will have patient complete re-enrollment for Keytruda 2018 at her next visit.

## 2016-02-01 NOTE — Telephone Encounter (Signed)
Per LOS I have scheduled appts and notified the scheduler 

## 2016-02-01 NOTE — Telephone Encounter (Signed)
Appointments scheduled per 12/6 LOS. Patient given AVS report and calendars with future scheduled appointments. °

## 2016-02-01 NOTE — Progress Notes (Signed)
65 year old female diagnosed with lung cancer.  She is a patient of Dr. [name] and Dr. Jordan Likes.   Past medical history includes pneumonia and COPD  Medications include Decadron, Folvite, multivitamin, Compazine.  Labs include albumin 3.5.  Height: 5 feet 3 inches. Weight: 108 pounds. Usual body weight: 115 pounds August 2017. BMI: 19.13.  Patient reports she has a poor appetite and is concerned with her weight loss. She denies eating less than usual.   She has lost approximately 6% of her body weight in 3 months. She reports she does not tolerate milk.  Nutrition diagnosis:  Unintended weight loss related to inadequate oral intake as evidenced by 6% weight loss over 3 months.  Intervention: Patient was educated to increase calories and protein at meals and snacks. I've reviewed high-calorie high-protein food. I encouraged her to try chocolate Ensure Plus which she liked.  I recommended 2-3 daily. Questions were answered.  Teach back method used.  Contact information provided.  Monitoring, evaluation, goals:  Patient will tolerate increased calories and protein to minimize weight loss.  Next visit: Wednesday, December 27, during infusion.  **Disclaimer: This note was dictated with voice recognition software. Similar sounding words can inadvertently be transcribed and this note may contain transcription errors which may not have been corrected upon publication of note.**

## 2016-02-01 NOTE — Patient Instructions (Signed)
[facility] Discharge Instructions for Patients Receiving Chemotherapy  Today you received the following chemotherapy agent:  Keytruda.  To help prevent nausea and vomiting after your treatment, we encourage you to take your nausea medication as directed by your doctor.   If you develop nausea and vomiting that is not controlled by your nausea medication, call the clinic.   BELOW ARE SYMPTOMS THAT SHOULD BE REPORTED IMMEDIATELY:  *FEVER GREATER THAN 100.5 F  *CHILLS WITH OR WITHOUT FEVER  NAUSEA AND VOMITING THAT IS NOT CONTROLLED WITH YOUR NAUSEA MEDICATION  *UNUSUAL SHORTNESS OF BREATH  *UNUSUAL BRUISING OR BLEEDING  TENDERNESS IN MOUTH AND THROAT WITH OR WITHOUT PRESENCE OF ULCERS  *URINARY PROBLEMS  *BOWEL PROBLEMS  UNUSUAL RASH Items with * indicate a potential emergency and should be followed up as soon as possible.  Feel free to call the clinic you have any questions or concerns. The clinic phone number is [phone].  Please show the [facility] at check-in to the Emergency Department and triage nurse.    Pembrolizumab injection What is this medicine? PEMBROLIZUMAB (pem broe liz ue mab) is a monoclonal antibody. It is used to treat melanoma and non-small cell lung cancer. This medicine may be used for other purposes; ask your health care provider or pharmacist if you have questions. What should I tell my health care provider before I take this medicine? They need to know if you have any of these conditions: -diabetes -immune system problems -inflammatory bowel disease -liver disease -lung or breathing disease -lupus -an unusual or allergic reaction to pembrolizumab, other medicines, foods, dyes, or preservatives -pregnant or trying to get pregnant -breast-feeding How should I use this medicine? This medicine is for infusion into a vein. It is given by a health care professional in a hospital or clinic setting. A special MedGuide will  be given to you before each treatment. Be sure to read this information carefully each time. Talk to your pediatrician regarding the use of this medicine in children. Special care may be needed. Overdosage: If you think you have taken too much of this medicine contact a poison control center or emergency room at once. NOTE: This medicine is only for you. Do not share this medicine with others. What if I miss a dose? It is important not to miss your dose. Call your doctor or health care professional if you are unable to keep an appointment. What may interact with this medicine? Interactions have not been studied. Give your health care provider a list of all the medicines, herbs, non-prescription drugs, or dietary supplements you use. Also tell them if you smoke, drink alcohol, or use illegal drugs. Some items may interact with your medicine. This list may not describe all possible interactions. Give your health care provider a list of all the medicines, herbs, non-prescription drugs, or dietary supplements you use. Also tell them if you smoke, drink alcohol, or use illegal drugs. Some items may interact with your medicine. What should I watch for while using this medicine? Your condition will be monitored carefully while you are receiving this medicine. You may need blood work done while you are taking this medicine. Do not become pregnant while taking this medicine or for 4 months after stopping it. Women should inform their doctor if they wish to become pregnant or think they might be pregnant. There is a potential for serious side effects to an unborn child. Talk to your health care professional or pharmacist for more information.  Do not breast-feed an infant while taking this medicine or for 4 months after the last dose. What side effects may I notice from receiving this medicine? Side effects that you should report to your doctor or health care professional as soon as possible: -allergic  reactions like skin rash, itching or hives, swelling of the face, lips, or tongue -bloody or black, tarry stools -breathing problems -change in the amount of urine -changes in vision -chest pain -chills -dark urine -dizziness or feeling faint or lightheaded -fast or irregular heartbeat -fever -flushing -hair loss -muscle pain -muscle weakness -persistent headache -signs and symptoms of high blood sugar such as dizziness; dry mouth; dry skin; fruity breath; nausea; stomach pain; increased hunger or thirst; increased urination -signs and symptoms of liver injury like dark urine, light-colored stools, loss of appetite, nausea, right upper belly pain, yellowing of the eyes or skin -stomach pain -weight loss Side effects that usually do not require medical attention (Report these to your doctor or health care professional if they continue or are bothersome.):constipation -cough -diarrhea -joint pain -tiredness This list may not describe all possible side effects. Call your doctor for medical advice about side effects. You may report side effects to FDA at 1-800-FDA-1088. Where should I keep my medicine? This drug is given in a hospital or clinic and will not be stored at home. NOTE: This sheet is a summary. It may not cover all possible information. If you have questions about this medicine, talk to your doctor, pharmacist, or health care provider.    2016, Elsevier/Gold Standard. (2014-04-13 17:24:19)

## 2016-02-01 NOTE — Progress Notes (Signed)
[facility] [phone]   [phone]  OFFICE PROGRESS NOTE  No PCP Per Patient No address on file  DIAGNOSIS: Stage IV (T1b, N2, M1 B) non-small cell lung cancer, adenocarcinoma with positive PDL1 expression (99%) presented with right lower lobe lung nodule with metastatic right hilar and subcarinal lymphadenopathy as well as hypermetabolic right acetabular bone lesion.  Genomic Alterations Identified? NF1 G329f*9 ARID1B [id number] PBRM1 [id number] TP53 [id number] Additional Findings? Microsatellite status MS-Stable Tumor Mutation Burden TMB-Intermediate; 7 Muts/Mb Additional Disease-relevant Genes with No Reportable Alterations Identified? EGFR KRAS ALK BRAF MET ERBB2 RET ROS1  PRIOR THERAPY: None.   CURRENT THERAPY: First-line treatment with immunotherapy with Ketruda (pembrolizumab) 200 MG IV every 3 weeks, status post 2 cycles. First cycle was given on 12/21/2015.  INTERVAL HISTORY: Brittany Mercado. female returns to the clinic today for follow-up visit accompanied by her son. The patient complains of pain in the right hip area. It has been getting worse over the last few weeks. She is also under a lot of stress recently and her blood pressure is not well controlled. She does not take any blood pressure medications. She tolerated the last cycle of her treatment well with no significant adverse effects. She has no nausea, vomiting, diarrhea or constipation. She denied having any significant chest pain, shortness of breath, cough or hemoptysis. No significant weight loss or night sweats. She is here today for evaluation before starting cycle #3 of her treatment.  MEDICAL HISTORY: Past Medical History:  Diagnosis Date  . Adenocarcinoma of right lung, stage 4 (HCC)[place]/05/2015  . Complication of anesthesia    hard to wake up  . COPD (chronic obstructive pulmonary disease) (HCC)[place]. Eczema   . Encounter for antineoplastic chemotherapy  11/30/2015  . Heart murmur    told at age 27 b79 never has had any problems  . Lung mass   . Pneumonia 09/2015    ALLERGIES:  is allergic to penicillins.  MEDICATIONS:  Current Outpatient Prescriptions  Medication Sig Dispense Refill  . aspirin 325 MG tablet Take 325 mg by mouth daily.    . deMarland Kitchenamethasone (DECADRON) 4 MG tablet 4 mg by mouth twice a day the day before, day of and day after the chemotherapy every 3 weeks 40 tablet 1  . folic acid (FOLVITE) 1 MG tablet Take 1 tablet (1 mg total) by mouth daily. 30 tablet 4  . Multiple Vitamin (MULTIVITAMIN) tablet Take 1 tablet by mouth daily.    . oxMarland KitchenCODONE-acetaminophen (PERCOCET/ROXICET) 5-325 MG tablet Take 1 tablet by mouth every 6 (six) hours as needed for severe pain. 40 tablet 0  . prochlorperazine (COMPAZINE) 10 MG tablet Take 1 tablet (10 mg total) by mouth every 6 (six) hours as needed for nausea or vomiting. 30 tablet 0   Current Facility-Administered Medications  Medication Dose Route Frequency Provider Last Rate Last Dose  . 0.9 %  sodium chloride infusion  500 mL Intravenous Continuous JohnIrene Shipper        SURGICAL HISTORY:  Past Surgical History:  Procedure Laterality Date  . ABDOMINAL HYSTERECTOMY    . OOPHORECTOMY  1996   tumor removed from right ovary  . TONSILLECTOMY    . VIDEO BRONCHOSCOPY WITH ENDOBRONCHIAL NAVIGATION N/A 11/18/2015   Procedure: VIDEO BRONCHOSCOPY WITH ENDOBRONCHIAL NAVIGATION;  Surgeon: StevMelrose Nakayama;  [facility];  Laterality: N/A;  . VIDEO BRONCHOSCOPY WITH ENDOBRONCHIAL ULTRASOUND N/A 11/18/2015  Procedure: VIDEO BRONCHOSCOPY WITH ENDOBRONCHIAL ULTRASOUND;  Surgeon: Melrose Nakayama, MD;  Location: [facility];  Service: Thoracic;  Laterality: N/A;    REVIEW OF SYSTEMS:  Constitutional: positive for fatigue Eyes: negative Ears, nose, mouth, throat, and face: negative Respiratory: negative Cardiovascular: negative Gastrointestinal:  negative Genitourinary:negative Integument/breast: negative Hematologic/lymphatic: negative Musculoskeletal:positive for bone pain Neurological: negative Behavioral/Psych: negative Endocrine: negative Allergic/Immunologic: negative   PHYSICAL EXAMINATION: General appearance: alert, cooperative, fatigued and no distress Head: Normocephalic, without obvious abnormality, atraumatic Neck: no adenopathy, no JVD, supple, symmetrical, trachea midline and thyroid not enlarged, symmetric, no tenderness/mass/nodules Lymph nodes: Cervical, supraclavicular, and axillary nodes normal. Resp: clear to auscultation bilaterally Back: symmetric, no curvature. ROM normal. No CVA tenderness. Cardio: regular rate and rhythm, S1, S2 normal, no murmur, click, rub or gallop GI: soft, non-tender; bowel sounds normal; no masses,  no organomegaly Extremities: extremities normal, atraumatic, no cyanosis or edema Neurologic: Alert and oriented X 3, normal strength and tone. Normal symmetric reflexes. Normal coordination and gait  ECOG PERFORMANCE STATUS: 1 - Symptomatic but completely ambulatory  Blood pressure (!) 154/64, pulse 79, temperature 97.8 F (36.6 C), temperature source Oral, resp. rate 18, height 5' 3"  (1.6 m), weight 108 lb 4.8 oz (49.1 kg), SpO2 99 %.  LABORATORY DATA: Lab Results  Component Value Date   WBC 11.3 (H) 02/01/2016   HGB 12.6 02/01/2016   HCT 38.2 02/01/2016   MCV 83.8 02/01/2016   PLT 308 02/01/2016      Chemistry      Component Value Date/Time   NA 139 01/11/2016 1021   K 3.9 01/11/2016 1021   CL 103 11/15/2015 1512   CO2 23 01/11/2016 1021   BUN 16.5 01/11/2016 1021   CREATININE 1.1 01/11/2016 1021      Component Value Date/Time   CALCIUM 9.6 01/11/2016 1021   ALKPHOS 576 (H) 01/11/2016 1021   AST 72 (H) 01/11/2016 1021   ALT 54 01/11/2016 1021   BILITOT 0.33 01/11/2016 1021       RADIOGRAPHIC STUDIES: No results found.  ASSESSMENT AND PLAN: [name] with: 1) stage IV non-small cell lung cancer, adenocarcinoma with PDL1 expression of 99%. The patient is currently on treatment with Ketruda (pembrolizumab) 200 mg IV every 3 weeks, status post 2 cycles. She is tolerating the treatment well. I recommended for her to proceed with cycle #3 today as a scheduled. She will come back for follow-up visit in 3 weeks for reevaluation with repeat CT scan of the chest, abdomen and pelvis for restaging of her disease. 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 3) hypertension: This is most likely secondary to anxiety. I advised the patient to monitor her blood pressure at home and if it persisted to be elevated to check with her primary care physician for consideration of treatment. She was advised to call immediately if she has any concerning symptoms in the interval. The patient voices understanding of current disease status and treatment options and is in agreement with the current care plan.  All questions were answered. The patient knows to call the clinic with any problems, questions or concerns. We can certainly see the patient much sooner if necessary.  Disclaimer: This note was dictated with voice recognition software. Similar sounding words can inadvertently be transcribed and may not be corrected upon review.

## 2016-02-02 ENCOUNTER — Ambulatory Visit
Admission: RE | Admit: 2016-02-02 | Discharge: 2016-02-02 | Disposition: A | Discharge status: Home / Self Care | Payer: Medicaid Other | Source: Ambulatory Visit | Attending: Radiation Oncology | Admitting: Radiation Oncology

## 2016-02-02 DIAGNOSIS — Z510 Encounter for antineoplastic radiation therapy: Secondary | ICD-10-CM | POA: Diagnosis not present

## 2016-02-03 ENCOUNTER — Telehealth: Payer: Self-pay | Admitting: *Deleted

## 2016-02-03 ENCOUNTER — Ambulatory Visit
Admission: RE | Admit: 2016-02-03 | Discharge: 2016-02-03 | Disposition: A | Discharge status: Home / Self Care | Payer: Medicaid Other | Source: Ambulatory Visit | Attending: Radiation Oncology | Admitting: Radiation Oncology

## 2016-02-03 DIAGNOSIS — Z510 Encounter for antineoplastic radiation therapy: Secondary | ICD-10-CM | POA: Diagnosis not present

## 2016-02-03 NOTE — Telephone Encounter (Signed)
Informed pt rx for synthroid at pharmacy ready for pick up- pt verbalized understanding.

## 2016-02-03 NOTE — Telephone Encounter (Signed)
-----   Message from Curt Bears, MD sent at 02/01/2016  6:06 PM EST ----- I sent prescription of levothyroxine 50 g by mouth daily to her pharmacy. ----- Message ----- From: Interface, Lab In Three Zero One Sent: 02/01/2016   9:21 AM To: Curt Bears, MD

## 2016-02-06 ENCOUNTER — Ambulatory Visit
Admission: RE | Admit: 2016-02-06 | Discharge: 2016-02-06 | Disposition: A | Discharge status: Home / Self Care | Payer: Medicaid Other | Source: Ambulatory Visit | Attending: Radiation Oncology | Admitting: Radiation Oncology

## 2016-02-06 DIAGNOSIS — Z510 Encounter for antineoplastic radiation therapy: Secondary | ICD-10-CM | POA: Diagnosis not present

## 2016-02-07 ENCOUNTER — Encounter: Payer: Self-pay | Admitting: Radiation Oncology

## 2016-02-07 ENCOUNTER — Ambulatory Visit: Payer: Medicaid Other

## 2016-02-07 ENCOUNTER — Ambulatory Visit
Admission: RE | Admit: 2016-02-07 | Discharge: 2016-02-07 | Disposition: A | Discharge status: Home / Self Care | Payer: Medicaid Other | Source: Ambulatory Visit | Attending: Radiation Oncology | Admitting: Radiation Oncology

## 2016-02-07 VITALS — BP 122/59 | HR 76 | Temp 97.8°F | Resp 18 | Ht 63.0 in | Wt 109.8 lb

## 2016-02-07 DIAGNOSIS — C3491 Malignant neoplasm of unspecified part of right bronchus or lung: Secondary | ICD-10-CM

## 2016-02-07 DIAGNOSIS — Z510 Encounter for antineoplastic radiation therapy: Secondary | ICD-10-CM | POA: Diagnosis not present

## 2016-02-07 NOTE — Progress Notes (Addendum)
°  Radiation Oncology         [phone] ________________________________  Name: Brittany Mercado MRN: [id number]  Date: 02/07/2016  DOB: 1950/09/01  End of Treatment Note  Diagnosis:   Adenocarcinoma of right lung, Stage IV     Indication for treatment:  Palliative       Radiation treatment dates:   01/26/16 - 02/07/16  Site/dose:   Right Pelvis treated to 30 Gy in 10 fractions  Beams/energy:   3D  //  15X  Narrative: The patient tolerated radiation treatment relatively well. The patient experienced some pain in her groin area during treatment, for which she took Percocet, but reported it had resolved by the end of her treatment. She also had a "pins and needles" sensation in her feet when bending over which resolved during the course of treatment.  Plan: The patient has completed radiation treatment. The patient will return to radiation oncology clinic for routine followup in one month. I advised them to call or return sooner if they have any questions or concerns related to their recovery or treatment.  -----------------------------------  Blair Promise, PhD, MD  This document serves as a record of services personally performed by Gery Pray, MD. It was created on his behalf by Maryla Morrow, a trained medical scribe. The creation of this record is based on the scribe's personal observations and the provider's statements to them. This document has been checked and approved by the attending provider.

## 2016-02-07 NOTE — Progress Notes (Signed)
Brittany Mercado has completed 10 fractions to her right pelvis.  She reports having no pain to her right groin area today.   She takes percocet as needed.  She mentioned that she has a pins and needles feeling in her feet after she has bends over has not been a problem lately.  Reports having fatigue taking rest periods and she feels better.  Appetite is average.  EOT one month follow up card given to come back to see Dr. Sondra Come. Wt Readings from Last 3 Encounters:  02/07/16 109 lb 12.8 oz (49.8 kg)  02/01/16 108 lb 4.8 oz (49.1 kg)  01/31/16 108 lb 9.6 oz (49.3 kg)  BP (!) 122/59   Pulse 76   Temp 97.8 F (36.6 C) (Oral)   Resp 18   Ht '5\' 3"'$  (1.6 m)   Wt 109 lb 12.8 oz (49.8 kg)   SpO2 100%   BMI 19.45 kg/m

## 2016-02-07 NOTE — Progress Notes (Signed)
  Radiation Oncology         [phone] ________________________________  Name: Brittany Mercado MRN: [id number]  Date: 02/07/2016  DOB: Feb 18, 1951  Weekly Radiation Therapy Management    ICD-9-CM ICD-10-CM   1. Adenocarcinoma of right lung, stage 4 (HCC) 162.9 C34.91      Current Dose: 30 Gy     Planned Dose:  30 Gy  Narrative . . . . . . . . The patient presents for routine under treatment assessment.  The patient has completed radiation therapy with 10 fractions to her right pelvis. Reports fatigue; she is taking rest periods and feels better afterwards. She reports no pain in her groin area today. When she does experience pain she takes percocet as needed. She mentioned the "pins and needles" feeling she had when bending over has not been a problem lately. Her appetite is okay.                                  The patient is without complaint.                                 Set-up films were reviewed.                                 The chart was checked. Physical Findings. . .  height is '5\' 3"'$  (1.6 m) and weight is 109 lb 12.8 oz (49.8 kg). Her oral temperature is 97.8 F (36.6 C). Her blood pressure is 122/59 (abnormal) and her pulse is 76. Her respiration is 18 and oxygen saturation is 100%.  Weight essentially stable.  No significant changes. Cardiopulmonary assessment is negative for acute distress and she exhibits normal effort. Lungs clear to auscultation bilaterally. Impression . . . . . . . The patient has tolerated radiation. Plan . . . . . . . . . . . . The patient completed the planned course of therapy. She will follow up in the clinic in 1 month. I encouraged her to contact me with any questions or concerns that may arise in the interim.  ________________________________   Blair Promise, PhD, MD  This document serves as a record of services personally performed by Gery Pray, MD. It was created on his behalf by Maryla Morrow, a trained medical scribe. The creation  of this record is based on the scribe's personal observations and the provider's statements to them. This document has been checked and approved by the attending provider.

## 2016-02-08 ENCOUNTER — Ambulatory Visit: Payer: Medicaid Other

## 2016-02-09 ENCOUNTER — Encounter: Payer: Self-pay | Admitting: Internal Medicine

## 2016-02-09 ENCOUNTER — Ambulatory Visit: Payer: Medicaid Other

## 2016-02-09 NOTE — Progress Notes (Signed)
Received an email from hospital side financial counselor(Jessica W.) regarding information needed to complete Medicaid application from her caseworker. Brittany Mercado I would contact the patient to make her aware. Contacted patient and advised her on what was still needed to complete her application and how to return it. Patient verbalized understanding. I relayed this information back to [employer].

## 2016-02-21 ENCOUNTER — Encounter: Payer: Self-pay | Admitting: Internal Medicine

## 2016-02-21 ENCOUNTER — Encounter (HOSPITAL_COMMUNITY): Payer: Self-pay

## 2016-02-21 ENCOUNTER — Ambulatory Visit (HOSPITAL_COMMUNITY)
Admission: RE | Admit: 2016-02-21 | Discharge: 2016-02-21 | Disposition: A | Discharge status: Home / Self Care | Payer: Medicaid Other | Source: Ambulatory Visit | Attending: Internal Medicine | Admitting: Internal Medicine

## 2016-02-21 DIAGNOSIS — M899 Disorder of bone, unspecified: Secondary | ICD-10-CM | POA: Insufficient documentation

## 2016-02-21 DIAGNOSIS — J439 Emphysema, unspecified: Secondary | ICD-10-CM | POA: Diagnosis not present

## 2016-02-21 DIAGNOSIS — I10 Essential (primary) hypertension: Secondary | ICD-10-CM | POA: Insufficient documentation

## 2016-02-21 DIAGNOSIS — I2510 Atherosclerotic heart disease of native coronary artery without angina pectoris: Secondary | ICD-10-CM | POA: Diagnosis not present

## 2016-02-21 DIAGNOSIS — Z5112 Encounter for antineoplastic immunotherapy: Secondary | ICD-10-CM

## 2016-02-21 DIAGNOSIS — C3491 Malignant neoplasm of unspecified part of right bronchus or lung: Secondary | ICD-10-CM | POA: Insufficient documentation

## 2016-02-21 DIAGNOSIS — I700 Atherosclerosis of aorta: Secondary | ICD-10-CM | POA: Diagnosis not present

## 2016-02-21 MED ORDER — IOPAMIDOL (ISOVUE-300) INJECTION 61%
100.0000 mL | Freq: Once | INTRAVENOUS | Status: AC | PRN
  Administered 2016-02-21: 100 mL via INTRAVENOUS

## 2016-02-21 NOTE — Progress Notes (Signed)
Obtained signatures from patient and physician for [employer] Patient Assistance for [place]. Fax to [employer]. Fax received ok per confirmation sheet.

## 2016-02-22 ENCOUNTER — Other Ambulatory Visit: Payer: Self-pay | Admitting: Emergency Medicine

## 2016-02-22 ENCOUNTER — Ambulatory Visit (HOSPITAL_BASED_OUTPATIENT_CLINIC_OR_DEPARTMENT_OTHER): Payer: Medicaid Other

## 2016-02-22 ENCOUNTER — Encounter: Payer: Self-pay | Admitting: Internal Medicine

## 2016-02-22 ENCOUNTER — Ambulatory Visit (HOSPITAL_BASED_OUTPATIENT_CLINIC_OR_DEPARTMENT_OTHER): Payer: Medicaid Other | Admitting: Internal Medicine

## 2016-02-22 ENCOUNTER — Ambulatory Visit: Payer: Medicaid Other | Admitting: Nutrition

## 2016-02-22 ENCOUNTER — Telehealth: Payer: Self-pay | Admitting: Internal Medicine

## 2016-02-22 ENCOUNTER — Other Ambulatory Visit (HOSPITAL_BASED_OUTPATIENT_CLINIC_OR_DEPARTMENT_OTHER): Payer: Medicaid Other

## 2016-02-22 VITALS — BP 147/56 | HR 81 | Temp 98.0°F | Resp 17 | Ht 63.0 in | Wt 108.8 lb

## 2016-02-22 DIAGNOSIS — I10 Essential (primary) hypertension: Secondary | ICD-10-CM | POA: Diagnosis not present

## 2016-02-22 DIAGNOSIS — C7951 Secondary malignant neoplasm of bone: Secondary | ICD-10-CM

## 2016-02-22 DIAGNOSIS — C3491 Malignant neoplasm of unspecified part of right bronchus or lung: Secondary | ICD-10-CM

## 2016-02-22 DIAGNOSIS — C3431 Malignant neoplasm of lower lobe, right bronchus or lung: Secondary | ICD-10-CM

## 2016-02-22 DIAGNOSIS — E039 Hypothyroidism, unspecified: Secondary | ICD-10-CM

## 2016-02-22 DIAGNOSIS — Z5112 Encounter for antineoplastic immunotherapy: Secondary | ICD-10-CM | POA: Diagnosis not present

## 2016-02-22 HISTORY — DX: Hypothyroidism, unspecified: E03.9

## 2016-02-22 LAB — COMPREHENSIVE METABOLIC PANEL
ALBUMIN: 4 g/dL (ref 3.5–5.0)
ALK PHOS: 324 U/L — AB (ref 40–150)
ALT: 29 U/L (ref 0–55)
AST: 76 U/L — AB (ref 5–34)
Anion Gap: 12 mEq/L — ABNORMAL HIGH (ref 3–11)
BUN: 12.1 mg/dL (ref 7.0–26.0)
CALCIUM: 9.5 mg/dL (ref 8.4–10.4)
CO2: 23 mEq/L (ref 22–29)
CREATININE: 1.4 mg/dL — AB (ref 0.6–1.1)
Chloride: 102 mEq/L (ref 98–109)
EGFR: 45 mL/min/{1.73_m2} — ABNORMAL LOW (ref >90)
GLUCOSE: 98 mg/dL (ref 70–140)
Potassium: 3.7 mEq/L (ref 3.5–5.1)
SODIUM: 138 meq/L (ref 136–145)
Total Bilirubin: 0.29 mg/dL (ref 0.20–1.20)
Total Protein: 9.1 g/dL — ABNORMAL HIGH (ref 6.4–8.3)

## 2016-02-22 LAB — CBC WITH DIFFERENTIAL/PLATELET
BASO%: 0.5 % (ref 0.0–2.0)
Basophils Absolute: 0 10*3/uL (ref 0.0–0.1)
EOS%: 7.7 % — AB (ref 0.0–7.0)
Eosinophils Absolute: 0.6 10*3/uL — ABNORMAL HIGH (ref 0.0–0.5)
HEMATOCRIT: 36.2 % (ref 34.8–46.6)
HEMOGLOBIN: 12.5 g/dL (ref 11.6–15.9)
LYMPH#: 1.8 10*3/uL (ref 0.9–3.3)
LYMPH%: 23.5 % (ref 14.0–49.7)
MCH: 28.2 pg (ref 25.1–34.0)
MCHC: 34.5 g/dL (ref 31.5–36.0)
MCV: 81.7 fL (ref 79.5–101.0)
MONO#: 0.3 10*3/uL (ref 0.1–0.9)
MONO%: 3.2 % (ref 0.0–14.0)
NEUT%: 65.1 % (ref 38.4–76.8)
NEUTROS ABS: 5.1 10*3/uL (ref 1.5–6.5)
Platelets: 232 10*3/uL (ref 145–400)
RBC: 4.43 10*6/uL (ref 3.70–5.45)
RDW: 15.4 % — AB (ref 11.2–14.5)
WBC: 7.8 10*3/uL (ref 3.9–10.3)

## 2016-02-22 LAB — TSH: TSH: 62.531 m(IU)/L — ABNORMAL HIGH (ref 0.308–3.960)

## 2016-02-22 MED ORDER — SODIUM CHLORIDE 0.9 % IV SOLN
Freq: Once | INTRAVENOUS | Status: AC
  Administered 2016-02-22: 10:00:00 via INTRAVENOUS

## 2016-02-22 MED ORDER — SODIUM CHLORIDE 0.9 % IV SOLN
200.0000 mg | Freq: Once | INTRAVENOUS | Status: AC
  Administered 2016-02-22: 200 mg via INTRAVENOUS
  Filled 2016-02-22: qty 8

## 2016-02-22 MED ORDER — LEVOTHYROXINE SODIUM 50 MCG PO TABS: 50.0000 ug | ORAL_TABLET | Freq: Every day | ORAL | 2 refills | Status: DC

## 2016-02-22 NOTE — Progress Notes (Signed)
Nutrition follow-up completed with patient diagnosed with lung cancer receiving Keytruda.  Weight is stable and documented as 108.8 pounds, December 27. Patient reports she tries to drink to Ensure Plus daily. She does not tolerate milk or milk products except for ice cream.  Nutrition diagnosis: Unintended weight loss, improved.  Intervention: Recommended patient try to increase Ensure Plus 3 times a day Reviewed importance of increasing high-calorie, high-protein foods at meals and snacks. Provided additional coupons. Questions were answered.  Teach back method used.  Monitoring, evaluation, goals:  Patient will work to increase calories and protein to minimize weight loss.  Next visit: Wednesday, January 17, during infusion.  **Disclaimer: This note was dictated with voice recognition software. Similar sounding words can inadvertently be transcribed and this note may contain transcription errors which may not have been corrected upon publication of note.**

## 2016-02-22 NOTE — Telephone Encounter (Signed)
Appointments scheduled per 02/22/16 los. Patient was given a copy of the AVS report and appointment schedule, per 02/22/16 los. (in infusion area by [employer]

## 2016-02-22 NOTE — Patient Instructions (Signed)
[facility] Discharge Instructions for Patients Receiving Chemotherapy  Today you received the following chemotherapy agents: Keytruda   To help prevent nausea and vomiting after your treatment, we encourage you to take your nausea medication as directed    If you develop nausea and vomiting that is not controlled by your nausea medication, call the clinic.   BELOW ARE SYMPTOMS THAT SHOULD BE REPORTED IMMEDIATELY:  *FEVER GREATER THAN 100.5 F  *CHILLS WITH OR WITHOUT FEVER  NAUSEA AND VOMITING THAT IS NOT CONTROLLED WITH YOUR NAUSEA MEDICATION  *UNUSUAL SHORTNESS OF BREATH  *UNUSUAL BRUISING OR BLEEDING  TENDERNESS IN MOUTH AND THROAT WITH OR WITHOUT PRESENCE OF ULCERS  *URINARY PROBLEMS  *BOWEL PROBLEMS  UNUSUAL RASH Items with * indicate a potential emergency and should be followed up as soon as possible.  Feel free to call the clinic you have any questions or concerns. The clinic phone number is [phone].  Please show the CHEMO ALERT CARD at check-in to the Emergency Department and triage nurse.   

## 2016-02-22 NOTE — Progress Notes (Signed)
[place] [phone]   [phone]  OFFICE PROGRESS NOTE  No PCP Per Patient No address on file  DIAGNOSIS: Stage IV (T1b, N2, M1 B) non-small cell lung cancer, adenocarcinoma with positive PDL 1 expression (99%), presented with right lower lobe lung nodule with metastatic right hilar and subcarinal lymphadenopathy as well as bone metastasis diagnosed in September 2017.  Genomic Alterations Identified? NF1 G376f*9 ARID1B [id number] PBRM1 [id number] TP53 [id number] Additional Findings? Microsatellite status MS-Stable Tumor Mutation Burden TMB-Intermediate; 7 Muts/Mb Additional Disease-relevant Genes with No Reportable Alterations Identified? EGFR KRAS ALK BRAF MET ERBB2 RET ROS1  PRIOR THERAPY: Palliative radiotherapy to the metastatic bone lesion under the care of Dr. KinaSondra ComeURRENT THERAPY: First-line treatment with immunotherapy with Ketruda (pembrolizumab) 200 mg IV every 3 weeks status post 3 cycles. First cycle was given 12/21/2015.  INTERVAL HISTORY: Brittany Pettiboney58. female returns to the clinic today for follow-up visit accompanied by her sister. The patient is currently undergoing treatment with immunotherapy with Ketruda (pembrolizumab) status post 3 cycles. She is tolerating the treatment well except for itching. She tried Benadryl but she could not tolerated. She denied having any significant chest pain, shortness of breath, cough or hemoptysis. She has no fever or chills. She denied having any current nausea or vomiting. She has no weight loss or night sweats. She underwent palliative radiotherapy to the metastatic bone lesion in the right hip and she had significant improvement in her pain. She had repeat CT scan of the chest, abdomen and pelvis performed recently and she is here for evaluation and discussion of her scan results.  MEDICAL HISTORY: Past Medical History:  Diagnosis Date  . Adenocarcinoma of right lung,  stage 4 (HCC)[place]/05/2015  . Complication of anesthesia    hard to wake up  . COPD (chronic obstructive pulmonary disease) (HCC)[place]. Eczema   . Encounter for antineoplastic chemotherapy 11/30/2015  . Heart murmur    told at age 72 b15 never has had any problems  . HTN (hypertension) 02/01/2016  . Lung mass   . Pneumonia 09/2015    ALLERGIES:  is allergic to penicillins.  MEDICATIONS:  Current Outpatient Prescriptions  Medication Sig Dispense Refill  . aspirin 325 MG tablet Take 325 mg by mouth daily.    . deMarland Kitchenamethasone (DECADRON) 4 MG tablet 4 mg by mouth twice a day the day before, day of and day after the chemotherapy every 3 weeks (Patient not taking: Reported on 02/07/2016) 40 tablet 1  . folic acid (FOLVITE) 1 MG tablet Take 1 tablet (1 mg total) by mouth daily. 30 tablet 4  . levothyroxine (SYNTHROID) 50 MCG tablet Take 1 tablet (50 mcg total) by mouth daily before breakfast. (Patient not taking: Reported on 02/07/2016) 30 tablet 2  . Multiple Vitamin (MULTIVITAMIN) tablet Take 1 tablet by mouth daily.    . oxMarland KitchenCODONE-acetaminophen (PERCOCET/ROXICET) 5-325 MG tablet Take 1 tablet by mouth every 6 (six) hours as needed for severe pain. 40 tablet 0  . prochlorperazine (COMPAZINE) 10 MG tablet Take 1 tablet (10 mg total) by mouth every 6 (six) hours as needed for nausea or vomiting. (Patient not taking: Reported on 02/07/2016) 30 tablet 0   Current Facility-Administered Medications  Medication Dose Route Frequency Provider Last Rate Last Dose  . 0.9 %  sodium chloride infusion  500 mL Intravenous Continuous JohnIrene Shipper        SURGICAL HISTORY:  Past Surgical History:  Procedure Laterality Date  . ABDOMINAL HYSTERECTOMY    . OOPHORECTOMY  1996   tumor removed from right ovary  . TONSILLECTOMY    . VIDEO BRONCHOSCOPY WITH ENDOBRONCHIAL NAVIGATION N/A 11/18/2015   Procedure: VIDEO BRONCHOSCOPY WITH ENDOBRONCHIAL NAVIGATION;  Surgeon: [place] Nakayama, MD;  Location: [facility];   Service: Thoracic;  Laterality: N/A;  . VIDEO BRONCHOSCOPY WITH ENDOBRONCHIAL ULTRASOUND N/A 11/18/2015   Procedure: VIDEO BRONCHOSCOPY WITH ENDOBRONCHIAL ULTRASOUND;  Surgeon: [place] Nakayama, MD;  Location: [facility];  Service: Thoracic;  Laterality: N/A;    REVIEW OF SYSTEMS:  Constitutional: negative Eyes: negative Ears, nose, mouth, throat, and face: negative Respiratory: negative Cardiovascular: negative Gastrointestinal: negative Genitourinary:negative Integument/breast: positive for pruritus Hematologic/lymphatic: negative Musculoskeletal:negative Neurological: negative Behavioral/Psych: negative Endocrine: negative Allergic/Immunologic: negative   PHYSICAL EXAMINATION: General appearance: alert, cooperative and no distress Head: Normocephalic, without obvious abnormality, atraumatic Neck: no adenopathy, no JVD, supple, symmetrical, trachea midline and thyroid not enlarged, symmetric, no tenderness/mass/nodules Lymph nodes: Cervical, supraclavicular, and axillary nodes normal. Resp: clear to auscultation bilaterally Back: symmetric, no curvature. ROM normal. No CVA tenderness. Cardio: normal apical impulse GI: soft, non-tender; bowel sounds normal; no masses,  no organomegaly Extremities: extremities normal, atraumatic, no cyanosis or edema Neurologic: Alert and oriented X 3, normal strength and tone. Normal symmetric reflexes. Normal coordination and gait  ECOG PERFORMANCE STATUS: 1 - Symptomatic but completely ambulatory  Blood pressure (!) 147/56, pulse 81, temperature 98 F (36.7 C), temperature source Oral, resp. rate 17, height _0  (1.6 m), weight 108 lb 12.8 oz (49.4 kg), SpO2 91 %.  LABORATORY DATA: Lab Results  Component Value Date   WBC 7.8 02/22/2016   HGB 12.5 02/22/2016   HCT 36.2 02/22/2016   MCV 81.7 02/22/2016   PLT 232 02/22/2016      Chemistry      Component Value Date/Time   NA 138 02/01/2016 0906   K 4.1 02/01/2016 0906   CL 103  11/15/2015 1512   CO2 24 02/01/2016 0906   BUN 13.1 02/01/2016 0906   CREATININE 1.3 (H) 02/01/2016 0906      Component Value Date/Time   CALCIUM 9.7 02/01/2016 0906   ALKPHOS 474 (H) 02/01/2016 0906   AST 71 (H) 02/01/2016 0906   ALT 42 02/01/2016 0906   BILITOT 0.30 02/01/2016 0906       RADIOGRAPHIC STUDIES: Ct Chest W Contrast  Result Date: 02/21/2016 CLINICAL DATA:  Lung cancer, chemotherapy in progress. EXAM: CT CHEST, ABDOMEN, AND PELVIS WITH CONTRAST TECHNIQUE: Multidetector CT imaging of the chest, abdomen and pelvis was performed following the standard protocol during bolus administration of intravenous contrast. CONTRAST:  182m ISOVUE-300 IOPAMIDOL (ISOVUE-300) INJECTION 61% COMPARISON:  MR pelvis 11/16/2015, CT chest 11/11/2015 and PET 11/07/2015. FINDINGS: CT CHEST FINDINGS Cardiovascular: Mild atherosclerotic calcification of the arterial vasculature, including coronary arteries. Heart size normal. No pericardial effusion. Mediastinum/Nodes: No pathologically enlarged mediastinal, hilar or axillary lymph nodes. Esophagus is grossly unremarkable. Tiny hiatal hernia. Lungs/Pleura: Moderate to severe centrilobular emphysema. Subpleural consolidation in the right lower lobe has decreased in size slightly, now measuring approximate 1.4 cm, previously 2.0 cm. 4 mm subpleural left lower lobe nodule, unchanged. No pleural fluid. Airway is unremarkable. Musculoskeletal: No worrisome lytic or sclerotic lesions. CT ABDOMEN PELVIS FINDINGS Hepatobiliary: Liver and gallbladder are unremarkable. No biliary ductal dilatation. Pancreas: Negative. Spleen: Negative. Adrenals/Urinary Tract: Adrenal glands are unremarkable. Lower pole right kidney is atrophic. Kidneys are otherwise unremarkable. Ureters are decompressed. Bladder is unremarkable. Stomach/Bowel: Tiny hiatal hernia. Stomach,  small bowel, appendix and colon are unremarkable. Fair amount of stool is seen in the colon, indicative of  constipation. Vascular/Lymphatic: Atherosclerotic calcification of the arterial vasculature without abdominal aortic aneurysm. No pathologically enlarged lymph nodes. Reproductive: Uterus is absent.  No adnexal mass. Other: No free fluid.  Mesenteries and peritoneum are unremarkable. Musculoskeletal: A lytic lesion at the right acetabulum/ inferior pubic ramus measures approximately 1.3 cm and corresponds to abnormality seen on 11/16/2015 as well as abnormal hypermetabolism on 11/07/2015. IMPRESSION: 1. Right lower lobe masslike consolidation has decreased slightly in size in the interval. 2. Lytic lesion at the junction of the right acetabulum and right inferior pubic ramus, as on 11/16/2015, new from 11/07/2015. 3. Aortic atherosclerosis (ICD10-170.0). Coronary artery calcification. 4.  Emphysema (ICD10-J43.9). Electronically Signed   By: Lorin Picket M.D.   On: 02/21/2016 10:02   Ct Abdomen Pelvis W Contrast  Result Date: 02/21/2016 CLINICAL DATA:  Lung cancer, chemotherapy in progress. EXAM: CT CHEST, ABDOMEN, AND PELVIS WITH CONTRAST TECHNIQUE: Multidetector CT imaging of the chest, abdomen and pelvis was performed following the standard protocol during bolus administration of intravenous contrast. CONTRAST:  113m ISOVUE-300 IOPAMIDOL (ISOVUE-300) INJECTION 61% COMPARISON:  MR pelvis 11/16/2015, CT chest 11/11/2015 and PET 11/07/2015. FINDINGS: CT CHEST FINDINGS Cardiovascular: Mild atherosclerotic calcification of the arterial vasculature, including coronary arteries. Heart size normal. No pericardial effusion. Mediastinum/Nodes: No pathologically enlarged mediastinal, hilar or axillary lymph nodes. Esophagus is grossly unremarkable. Tiny hiatal hernia. Lungs/Pleura: Moderate to severe centrilobular emphysema. Subpleural consolidation in the right lower lobe has decreased in size slightly, now measuring approximate 1.4 cm, previously 2.0 cm. 4 mm subpleural left lower lobe nodule, unchanged. No  pleural fluid. Airway is unremarkable. Musculoskeletal: No worrisome lytic or sclerotic lesions. CT ABDOMEN PELVIS FINDINGS Hepatobiliary: Liver and gallbladder are unremarkable. No biliary ductal dilatation. Pancreas: Negative. Spleen: Negative. Adrenals/Urinary Tract: Adrenal glands are unremarkable. Lower pole right kidney is atrophic. Kidneys are otherwise unremarkable. Ureters are decompressed. Bladder is unremarkable. Stomach/Bowel: Tiny hiatal hernia. Stomach, small bowel, appendix and colon are unremarkable. Fair amount of stool is seen in the colon, indicative of constipation. Vascular/Lymphatic: Atherosclerotic calcification of the arterial vasculature without abdominal aortic aneurysm. No pathologically enlarged lymph nodes. Reproductive: Uterus is absent.  No adnexal mass. Other: No free fluid.  Mesenteries and peritoneum are unremarkable. Musculoskeletal: A lytic lesion at the right acetabulum/ inferior pubic ramus measures approximately 1.3 cm and corresponds to abnormality seen on 11/16/2015 as well as abnormal hypermetabolism on 11/07/2015. IMPRESSION: 1. Right lower lobe masslike consolidation has decreased slightly in size in the interval. 2. Lytic lesion at the junction of the right acetabulum and right inferior pubic ramus, as on 11/16/2015, new from 11/07/2015. 3. Aortic atherosclerosis (ICD10-170.0). Coronary artery calcification. 4.  Emphysema (ICD10-J43.9). Electronically Signed   By: MLorin PicketM.D.   On: 02/21/2016 10:02    ASSESSMENT AND PLAN: This is a very pleasant 65years old African-American female with metastatic non-small cell lung cancer currently undergoing treatment with immunotherapy with KHungarystatus post 3 cycles. The patient is tolerating her treatment well except for pruritus. She had repeat CT scan of the chest, abdomen and pelvis performed recently. I personally and independently reviewed the CT scan images and discussed the results with the patient and her  sister today. I recommended for the patient to continue her current treatment with KHungaryand she will proceed with cycle #4 today. She will come back for follow-up visit in 3 weeks for evaluation before starting cycle #5. For  the itching, this is most likely from her treatment with immunotherapy. I recommended for the patient to use over-the-counter Claritin or Zyrtec. She was also advised to apply hydrocortisone cream to these areas if needed. For hypertension, I recommended for the patient to monitor her blood pressure closely at home and to consult with her primary care physician F is still elevated. For the immunotherapy induced hypothyroidism, I started the patient on levothyroxine 15 g subcutaneously daily. She was advised to call immediately if she has any concerning symptoms in the interval. The patient voices understanding of current disease status and treatment options and is in agreement with the current care plan.  All questions were answered. The patient knows to call the clinic with any problems, questions or concerns. We can certainly see the patient much sooner if necessary.  Disclaimer: This note was dictated with voice recognition software. Similar sounding words can inadvertently be transcribed and may not be corrected upon review.

## 2016-02-24 ENCOUNTER — Telehealth: Payer: Self-pay | Admitting: Medical Oncology

## 2016-02-24 NOTE — Telephone Encounter (Signed)
She  needs clinical information for [place] . She will fax form to [facility]

## 2016-02-24 NOTE — Telephone Encounter (Signed)
Faxed completed clinical form for Keytruda  to [facility].

## 2016-03-09 ENCOUNTER — Encounter: Payer: Self-pay | Admitting: Internal Medicine

## 2016-03-09 NOTE — Progress Notes (Signed)
Received voicemail from [employer] to call and confirm shipping address, delivery of refill, and refill contact for Keytruda. Approval letter was also sent for approval for patient.  Spoke with Angie'[email or web address]  who confirmed shipment will be sent on 03/12/16 and received on Tuesday 03/13/16. Listed Carol J(pharmacy) as person to call in refills.

## 2016-03-14 ENCOUNTER — Ambulatory Visit: Payer: Self-pay | Admitting: Internal Medicine

## 2016-03-14 ENCOUNTER — Encounter: Payer: Self-pay | Admitting: Oncology

## 2016-03-14 ENCOUNTER — Telehealth: Payer: Self-pay | Admitting: Medical Oncology

## 2016-03-14 ENCOUNTER — Other Ambulatory Visit: Payer: Self-pay

## 2016-03-14 ENCOUNTER — Encounter: Payer: Self-pay | Admitting: Nutrition

## 2016-03-14 ENCOUNTER — Ambulatory Visit: Payer: Self-pay

## 2016-03-14 NOTE — Telephone Encounter (Signed)
I called pt and she is "stuck " and cannot come in today. Request sent to r./s for next week.

## 2016-03-15 ENCOUNTER — Telehealth: Payer: Self-pay | Admitting: Oncology

## 2016-03-15 ENCOUNTER — Ambulatory Visit: Admission: RE | Admit: 2016-03-15 | Payer: Medicaid Other | Source: Ambulatory Visit | Admitting: Radiation Oncology

## 2016-03-15 NOTE — Telephone Encounter (Signed)
Called Brittany Mercado to see if she is coming in for follow up today.  She said she is not because she is "stuck in [employer]."  Transferred her to [place] to reschedule.

## 2016-03-16 ENCOUNTER — Telehealth: Payer: Self-pay | Admitting: Internal Medicine

## 2016-03-16 NOTE — Telephone Encounter (Signed)
Poke with patient re new appointment for 1/29. Rescheduled due to weather per 1/17 schedule message. Remaining appointments to be adjusted at 1/29 visit.

## 2016-03-26 ENCOUNTER — Telehealth: Payer: Self-pay | Admitting: *Deleted

## 2016-03-26 ENCOUNTER — Encounter: Payer: Self-pay | Admitting: Oncology

## 2016-03-26 ENCOUNTER — Other Ambulatory Visit (HOSPITAL_BASED_OUTPATIENT_CLINIC_OR_DEPARTMENT_OTHER): Payer: Medicaid Other

## 2016-03-26 ENCOUNTER — Ambulatory Visit (HOSPITAL_BASED_OUTPATIENT_CLINIC_OR_DEPARTMENT_OTHER): Payer: Medicaid Other

## 2016-03-26 ENCOUNTER — Telehealth: Payer: Self-pay | Admitting: Oncology

## 2016-03-26 ENCOUNTER — Ambulatory Visit (HOSPITAL_BASED_OUTPATIENT_CLINIC_OR_DEPARTMENT_OTHER): Payer: Medicaid Other | Admitting: Oncology

## 2016-03-26 VITALS — BP 105/63 | HR 78 | Temp 98.5°F | Resp 18 | Ht 63.0 in | Wt 109.3 lb

## 2016-03-26 DIAGNOSIS — C3491 Malignant neoplasm of unspecified part of right bronchus or lung: Secondary | ICD-10-CM

## 2016-03-26 DIAGNOSIS — J449 Chronic obstructive pulmonary disease, unspecified: Secondary | ICD-10-CM

## 2016-03-26 DIAGNOSIS — C3431 Malignant neoplasm of lower lobe, right bronchus or lung: Secondary | ICD-10-CM

## 2016-03-26 DIAGNOSIS — Z5112 Encounter for antineoplastic immunotherapy: Secondary | ICD-10-CM

## 2016-03-26 DIAGNOSIS — C7951 Secondary malignant neoplasm of bone: Secondary | ICD-10-CM | POA: Diagnosis not present

## 2016-03-26 DIAGNOSIS — Z79899 Other long term (current) drug therapy: Secondary | ICD-10-CM

## 2016-03-26 DIAGNOSIS — E032 Hypothyroidism due to medicaments and other exogenous substances: Secondary | ICD-10-CM

## 2016-03-26 LAB — CBC WITH DIFFERENTIAL/PLATELET
BASO%: 1.1 % (ref 0.0–2.0)
Basophils Absolute: 0.1 10*3/uL (ref 0.0–0.1)
EOS%: 2.9 % (ref 0.0–7.0)
Eosinophils Absolute: 0.2 10*3/uL (ref 0.0–0.5)
HCT: 32.7 % — ABNORMAL LOW (ref 34.8–46.6)
HGB: 11.3 g/dL — ABNORMAL LOW (ref 11.6–15.9)
LYMPH#: 1.6 10*3/uL (ref 0.9–3.3)
LYMPH%: 19.5 % (ref 14.0–49.7)
MCH: 29.1 pg (ref 25.1–34.0)
MCHC: 34.5 g/dL (ref 31.5–36.0)
MCV: 84.3 fL (ref 79.5–101.0)
MONO#: 0.4 10*3/uL (ref 0.1–0.9)
MONO%: 5.3 % (ref 0.0–14.0)
NEUT%: 71.2 % (ref 38.4–76.8)
NEUTROS ABS: 5.7 10*3/uL (ref 1.5–6.5)
PLATELETS: 245 10*3/uL (ref 145–400)
RBC: 3.88 10*6/uL (ref 3.70–5.45)
RDW: 15.6 % — ABNORMAL HIGH (ref 11.2–14.5)
WBC: 8 10*3/uL (ref 3.9–10.3)

## 2016-03-26 LAB — COMPREHENSIVE METABOLIC PANEL
ALT: 18 U/L (ref 0–55)
ANION GAP: 10 meq/L (ref 3–11)
AST: 36 U/L — AB (ref 5–34)
Albumin: 4.2 g/dL (ref 3.5–5.0)
Alkaline Phosphatase: 232 U/L — ABNORMAL HIGH (ref 40–150)
BILIRUBIN TOTAL: 0.25 mg/dL (ref 0.20–1.20)
BUN: 13.7 mg/dL (ref 7.0–26.0)
CHLORIDE: 105 meq/L (ref 98–109)
CO2: 25 meq/L (ref 22–29)
CREATININE: 1.3 mg/dL — AB (ref 0.6–1.1)
Calcium: 9.9 mg/dL (ref 8.4–10.4)
EGFR: 52 mL/min/{1.73_m2} — ABNORMAL LOW (ref >90)
GLUCOSE: 94 mg/dL (ref 70–140)
Potassium: 4 mEq/L (ref 3.5–5.1)
SODIUM: 140 meq/L (ref 136–145)
TOTAL PROTEIN: 8.9 g/dL — AB (ref 6.4–8.3)

## 2016-03-26 LAB — TSH: TSH: 5.503 m(IU)/L — ABNORMAL HIGH (ref 0.308–3.960)

## 2016-03-26 MED ORDER — HYDROCORTISONE 0.5 % EX CREA: 1.0000 "application " | TOPICAL_CREAM | Freq: Two times a day (BID) | CUTANEOUS | 2 refills | Status: DC

## 2016-03-26 MED ORDER — SODIUM CHLORIDE 0.9 % IV SOLN
200.0000 mg | Freq: Once | INTRAVENOUS | Status: AC
  Administered 2016-03-26: 200 mg via INTRAVENOUS
  Filled 2016-03-26: qty 8

## 2016-03-26 MED ORDER — SODIUM CHLORIDE 0.9 % IV SOLN
Freq: Once | INTRAVENOUS | Status: AC
  Administered 2016-03-26: 12:00:00 via INTRAVENOUS

## 2016-03-26 MED ORDER — CETIRIZINE HCL 10 MG PO TABS: 10.0000 mg | ORAL_TABLET | Freq: Every day | ORAL | 2 refills | Status: DC

## 2016-03-26 MED ORDER — SODIUM CHLORIDE 0.9% FLUSH
10.0000 mL | INTRAVENOUS | Status: DC | PRN
  Filled 2016-03-26: qty 10

## 2016-03-26 MED ORDER — HEPARIN SOD (PORK) LOCK FLUSH 100 UNIT/ML IV SOLN
500.0000 [IU] | Freq: Once | INTRAVENOUS | Status: DC | PRN
  Filled 2016-03-26: qty 5

## 2016-03-26 NOTE — Progress Notes (Signed)
    [facility] [phone]   [phone]  OFFICE PROGRESS NOTE  No PCP Per Patient No address on file  DIAGNOSIS: Stage IV (T1b, N2, M1 B) non-small cell lung cancer, adenocarcinoma with positive PDL 1 expression (99%), presented with right lower lobe lung nodule with metastatic right hilar and subcarinal lymphadenopathy as well as bone metastasis diagnosed in September 2017.  Genomic Alterations Identified? NF1 G367fs*9 ARID1B K17fs*189 PBRM1 R512fs*4 TP53 P72fs*76 Additional Findings? Microsatellite status MS-Stable Tumor Mutation Burden TMB-Intermediate; 7 Muts/Mb Additional Disease-relevant Genes with No Reportable Alterations Identified? EGFR KRAS ALK BRAF MET ERBB2 RET ROS1  PRIOR THERAPY: Palliative radiotherapy to the metastatic bone lesion under the care of Dr. [place].  CURRENT THERAPY: First-line treatment with immunotherapy with Ketruda (pembrolizumab) 200 mg IV every 3 weeks status post 4 cycles. First cycle was given 12/21/2015.  INTERVAL HISTORY: Brittany Mercado 66 y.o. female returns to the clinic today for follow-up visit accompanied by her sister. The patient is currently undergoing treatment with immunotherapy with Ketruda (pembrolizumab) status post 4 cycles. She is tolerating the treatment well except for itching. She tried Benadryl but she could not tolerated. It was recommended that she try Zyrtec or Claritin along with hydrocortisone cream at her last visit, but she did not try this. She denied having any significant chest pain, shortness of breath, cough or hemoptysis. She has no fever or chills. She denied having any current nausea or vomiting. She has no weight loss or night sweats. She underwent palliative radiotherapy to the metastatic bone lesion in the right hip and she had significant improvement in her pain. She is here for evaluation prior to her fifth cycle of immunotherapy.  MEDICAL HISTORY: Past Medical History:   Diagnosis Date  . Adenocarcinoma of right lung, stage 4 (HCC) 11/30/2015  . Complication of anesthesia    hard to wake up  . COPD (chronic obstructive pulmonary disease) (HCC)   . Eczema   . Encounter for antineoplastic chemotherapy 11/30/2015  . Heart murmur    told at age 15 but never has had any problems  . History of radiation therapy 01/26/16-02/07/16   right pelvis 30 Gy in 10 fractions  . HTN (hypertension) 02/01/2016  . 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 Lung mass   . Pneumonia 09/2015    ALLERGIES:  is allergic to penicillins.  MEDICATIONS:  Current Outpatient Prescriptions  Medication Sig Dispense Refill  . folic acid (FOLVITE) 1 MG tablet Take 1 tablet (1 mg total) by mouth daily. 30 tablet 4  . levothyroxine (SYNTHROID) 50 MCG tablet Take 1 tablet (50 mcg total) by mouth daily before breakfast. 30 tablet 2  . Multiple Vitamin (MULTIVITAMIN) tablet Take 1 tablet by mouth daily.    . oxyCODONE-acetaminophen (PERCOCET/ROXICET) 5-325 MG tablet Take 1 tablet by mouth every 6 (six) hours as needed for severe pain. 40 tablet 0  . prochlorperazine (COMPAZINE) 10 MG tablet Take 1 tablet (10 mg total) by mouth every 6 (six) hours as needed for nausea or vomiting. 30 tablet 0   No current facility-administered medications for this visit.     SURGICAL HISTORY:  Past Surgical History:  Procedure Laterality Date  . ABDOMINAL HYSTERECTOMY    . OOPHORECTOMY  1996   tumor removed from right ovary  . TONSILLECTOMY    . VIDEO BRONCHOSCOPY WITH ENDOBRONCHIAL NAVIGATION N/A 11/18/2015   Procedure: VIDEO BRONCHOSCOPY WITH ENDOBRONCHIAL NAVIGATION;  Surgeon: Steven C Hendrickson, MD;  Location: [facility];  Service: Thoracic;    Laterality: N/A;  . VIDEO BRONCHOSCOPY WITH ENDOBRONCHIAL ULTRASOUND N/A 11/18/2015   Procedure: VIDEO BRONCHOSCOPY WITH ENDOBRONCHIAL ULTRASOUND;  Surgeon: [place] Nakayama, MD;  Location: [facility];  Service: Thoracic;  Laterality: N/A;    REVIEW OF SYSTEMS:   Constitutional: negative Eyes: negative Ears, nose, mouth, throat, and face: negative Respiratory: negative Cardiovascular: negative Gastrointestinal: negative Genitourinary:negative Integument/breast: positive for pruritus Hematologic/lymphatic: negative Musculoskeletal:negative Neurological: negative Behavioral/Psych: negative Endocrine: negative Allergic/Immunologic: negative   PHYSICAL EXAMINATION: General appearance: alert, cooperative and no distress Head: Normocephalic, without obvious abnormality, atraumatic Neck: no adenopathy, no JVD, supple, symmetrical, trachea midline and thyroid not enlarged, symmetric, no tenderness/mass/nodules Lymph nodes: Cervical, supraclavicular, and axillary nodes normal. Resp: clear to auscultation bilaterally Back: symmetric, no curvature. ROM normal. No CVA tenderness. Cardio: normal apical impulse GI: soft, non-tender; bowel sounds normal; no masses,  no organomegaly Extremities: extremities normal, atraumatic, no cyanosis or edema Neurologic: Alert and oriented X 3, normal strength and tone. Normal symmetric reflexes. Normal coordination and gait  ECOG PERFORMANCE STATUS: 1 - Symptomatic but completely ambulatory  Blood pressure 105/63, pulse 78, temperature 98.5 F (36.9 C), temperature source Oral, resp. rate 18, height 5' 3" (1.6 m), weight 109 lb 4.8 oz (49.6 kg), SpO2 99 %.  LABORATORY DATA: Lab Results  Component Value Date   WBC 8.0 03/26/2016   HGB 11.3 (L) 03/26/2016   HCT 32.7 (L) 03/26/2016   MCV 84.3 03/26/2016   PLT 245 03/26/2016      Chemistry      Component Value Date/Time   NA 138 02/22/2016 0835   K 3.7 02/22/2016 0835   CL 103 11/15/2015 1512   CO2 23 02/22/2016 0835   BUN 12.1 02/22/2016 0835   CREATININE 1.4 (H) 02/22/2016 0835      Component Value Date/Time   CALCIUM 9.5 02/22/2016 0835   ALKPHOS 324 (H) 02/22/2016 0835   AST 76 (H) 02/22/2016 0835   ALT 29 02/22/2016 0835   BILITOT 0.29  02/22/2016 0835       RADIOGRAPHIC STUDIES: No results found.  ASSESSMENT AND PLAN: This is a very pleasant 66 year old African-American female with metastatic non-small cell lung cancer currently undergoing treatment with immunotherapy with [place] status post 3 cycles. The patient is tolerating her treatment well except for pruritus. I recommended for the patient to proceed with cycle 5 of [place] today. She will come back for follow-up visit in 3 weeks for evaluation before starting cycle #6. Message sent to scheduling to adjust her appointments since she had a delay with this cycle due to recent snow. For the itching, this is most likely from her treatment with immunotherapy. I again recommended for the patient to useClaritin or Zyrtec. She was also advised to apply hydrocortisone cream to these areas if needed. I have sent prescriptions for Zyrtec and hydrocortisone to her pharmacy as she stated the cost would be cheaper by sending the prescription rather than buying over-the-counter. For the immunotherapy induced hypothyroidism, she is on levothyroxine 50 g daily. She was advised to call immediately if she has any concerning symptoms in the interval. The patient voices understanding of current disease status and treatment options and is in agreement with the current care plan.  Plan reviewed with Dr. [name].  All questions were answered. The patient knows to call the clinic with any problems, questions or concerns. We can certainly see the patient much sooner if necessary.  Mikey Bussing, DNP, AGPCNP-BC, AOCNP

## 2016-03-26 NOTE — Telephone Encounter (Signed)
Per 1/29 LOS and staff message I have scheduled appts and notified the scheduelr

## 2016-03-26 NOTE — Telephone Encounter (Signed)
Appointments scheduled per 1/29 LOS. Patient given AVS report and calendars with future scheduled appointments.

## 2016-03-26 NOTE — Patient Instructions (Signed)
[facility] Discharge Instructions for Patients Receiving Chemotherapy  Today you received the following chemotherapy agents: Keytruda   To help prevent nausea and vomiting after your treatment, we encourage you to take your nausea medication as directed    If you develop nausea and vomiting that is not controlled by your nausea medication, call the clinic.   BELOW ARE SYMPTOMS THAT SHOULD BE REPORTED IMMEDIATELY:  *FEVER GREATER THAN 100.5 F  *CHILLS WITH OR WITHOUT FEVER  NAUSEA AND VOMITING THAT IS NOT CONTROLLED WITH YOUR NAUSEA MEDICATION  *UNUSUAL SHORTNESS OF BREATH  *UNUSUAL BRUISING OR BLEEDING  TENDERNESS IN MOUTH AND THROAT WITH OR WITHOUT PRESENCE OF ULCERS  *URINARY PROBLEMS  *BOWEL PROBLEMS  UNUSUAL RASH Items with * indicate a potential emergency and should be followed up as soon as possible.  Feel free to call the clinic you have any questions or concerns. The clinic phone number is [phone].  Please show the CHEMO ALERT CARD at check-in to the Emergency Department and triage nurse.   

## 2016-04-04 ENCOUNTER — Ambulatory Visit: Payer: Medicaid Other | Admitting: Nutrition

## 2016-04-04 ENCOUNTER — Ambulatory Visit: Payer: Self-pay | Admitting: Internal Medicine

## 2016-04-04 ENCOUNTER — Other Ambulatory Visit: Payer: Self-pay

## 2016-04-04 ENCOUNTER — Ambulatory Visit: Payer: Self-pay

## 2016-04-04 NOTE — Progress Notes (Signed)
Nutrition follow-up completed with patient diagnosed with lung cancer. Weight continues to be stable and was documented as 108.4 pounds today decreased slightly from 108.8 pounds December 27. Patient reports she drinks Ensure Plus most days. She only drinks chocolate flavored supplements.  She does not tolerate milk or milk products. Patient denies nutrition impact symptoms.  Nutrition diagnosis: Unintended weight loss improved.  Intervention:  Educated patient to increase Ensure Plus twice a day midafternoon and bedtime. Provided samples of Ensure Plus and boost plus as well as coupons. Questions were answered.  Teach back method used.  Monitoring, evaluation, goals:  Patient will continue to work to increase calories and protein to minimize weight loss.  Next visit: Monday, February 19, during infusion.  **Disclaimer: This note was dictated with voice recognition software. Similar sounding words can inadvertently be transcribed and this note may contain transcription errors which may not have been corrected upon publication of note.**

## 2016-04-05 ENCOUNTER — Encounter: Payer: Self-pay | Admitting: Radiation Oncology

## 2016-04-05 ENCOUNTER — Ambulatory Visit
Admission: RE | Admit: 2016-04-05 | Discharge: 2016-04-05 | Disposition: A | Discharge status: Home / Self Care | Payer: Medicaid Other | Source: Ambulatory Visit | Attending: Radiation Oncology | Admitting: Radiation Oncology

## 2016-04-05 DIAGNOSIS — C3491 Malignant neoplasm of unspecified part of right bronchus or lung: Secondary | ICD-10-CM

## 2016-04-05 DIAGNOSIS — Z880 Allergy status to penicillin: Secondary | ICD-10-CM | POA: Insufficient documentation

## 2016-04-05 DIAGNOSIS — Z923 Personal history of irradiation: Secondary | ICD-10-CM | POA: Diagnosis not present

## 2016-04-05 DIAGNOSIS — M25551 Pain in right hip: Secondary | ICD-10-CM | POA: Insufficient documentation

## 2016-04-05 DIAGNOSIS — Z7982 Long term (current) use of aspirin: Secondary | ICD-10-CM | POA: Diagnosis not present

## 2016-04-05 NOTE — Progress Notes (Signed)
Follow up s/p rad txright pelvis 01/26/16-02/07/16 no skin irritation, no coughing, appetite good, no pain, bowels normal, bladder normal, no nausea 10:06 AM BP (!) 159/57 (BP Location: Right Arm, Patient Position: Sitting, Cuff Size: Normal)   Pulse 99   Temp 98.2 F (36.8 C) (Oral)   Resp 18   Ht '5\' 3"'$  (1.6 m)   Wt 107 lb 3.2 oz (48.6 kg)   SpO2 99% Comment: room air  BMI 18.99 kg/m   Wt Readings from Last 3 Encounters:  04/05/16 107 lb 3.2 oz (48.6 kg)  04/04/16 108 lb 6.4 oz (49.2 kg)  03/26/16 109 lb 4.8 oz (49.6 kg)

## 2016-04-05 NOTE — Progress Notes (Signed)
Radiation Oncology         [phone] ________________________________  Name: Brittany Mercado MRN: [id number]  Date: 04/05/2016  DOB: [birth year]  Follow-Up Visit Note  CC: No PCP Per Patient  Curt Bears, MD    ICD-9-CM ICD-10-CM   1. Adenocarcinoma of right lung, stage 4 (HCC) 162.9 C34.91     Diagnosis:   Stage IV (t1b, N2, M1b) adenocarcinoma of the Right Lung with highly suspicious metastatic lesion to the right acetabulum  Interval Since Last Radiation:  2 months  01/26/16 - 02/07/16 : Right Pelvis treated to 30 Gy in 10 fractions  Narrative:  The patient returns today for routine follow-up of radiation complete 02/07/16.  On review of systems, the patient denies pain. She denies skin irritation. Denies coughing. She denies nausea. The patient reports normal bladder and bowel activity. She reports a good appetite. She is currently taking Keytruda.                       ALLERGIES:  is allergic to penicillins.  Meds: Current Outpatient Prescriptions  Medication Sig Dispense Refill  . aspirin 325 MG tablet Take 325 mg by mouth daily.    . cetirizine (ZYRTEC) 10 MG tablet Take 1 tablet (10 mg total) by mouth daily. 30 tablet 2  . folic acid (FOLVITE) 1 MG tablet Take 1 tablet (1 mg total) by mouth daily. 30 tablet 4  . hydrocortisone cream 0.5 % Apply 1 application topically 2 (two) times daily. 30 g 2  . levothyroxine (SYNTHROID) 50 MCG tablet Take 1 tablet (50 mcg total) by mouth daily before breakfast. 30 tablet 2  . Multiple Vitamin (MULTIVITAMIN) tablet Take 1 tablet by mouth daily.    Marland Kitchen oxyCODONE-acetaminophen (PERCOCET/ROXICET) 5-325 MG tablet Take 1 tablet by mouth every 6 (six) hours as needed for severe pain. (Patient not taking: Reported on 04/05/2016) 40 tablet 0  . prochlorperazine (COMPAZINE) 10 MG tablet Take 1 tablet (10 mg total) by mouth every 6 (six) hours as needed for nausea or vomiting. (Patient not taking: Reported on 04/05/2016) 30 tablet 0   No current  facility-administered medications for this encounter.     Physical Findings: The patient is in no acute distress. Patient is alert and oriented.  height is '5\' 3"'$  (1.6 m) and weight is 107 lb 3.2 oz (48.6 kg). Her oral temperature is 98.2 F (36.8 C). Her blood pressure is 159/57 (abnormal) and her pulse is 99. Her respiration is 18 and oxygen saturation is 99%.  No significant changes. Heart regular in rate and rhythm. Lungs clear to auscultation bilaterally. No cervical, supraclavicular, or axillary adenopathy noted. Abdomen is soft, non tender, and non distended. Normal active bowel sounds noted. Strength is symmetric in legs.  Lab Findings: Lab Results  Component Value Date   WBC 8.0 03/26/2016   HGB 11.3 (L) 03/26/2016   HCT 32.7 (L) 03/26/2016   MCV 84.3 03/26/2016   PLT 245 03/26/2016    Radiographic Findings: No results found.  Impression:  . She received good palliation of her pain along the right hip area.  Plan:  The patient will continue to follow with medical oncology. She will follow up with me as needed.  -----------------------------------  Blair Promise, PhD, MD  This document serves as a record of services personally performed by Gery Pray, MD. It was created on his behalf by Maryla Morrow, a trained medical scribe. The creation of this record is based on the scribe's personal  observations and the provider's statements to them. This document has been checked and approved by the attending provider.

## 2016-04-16 ENCOUNTER — Other Ambulatory Visit (HOSPITAL_BASED_OUTPATIENT_CLINIC_OR_DEPARTMENT_OTHER): Payer: Medicaid Other

## 2016-04-16 ENCOUNTER — Encounter: Payer: Self-pay | Admitting: Internal Medicine

## 2016-04-16 ENCOUNTER — Ambulatory Visit (HOSPITAL_BASED_OUTPATIENT_CLINIC_OR_DEPARTMENT_OTHER): Payer: Medicaid Other

## 2016-04-16 ENCOUNTER — Other Ambulatory Visit: Payer: Self-pay | Admitting: *Deleted

## 2016-04-16 ENCOUNTER — Telehealth: Payer: Self-pay | Admitting: *Deleted

## 2016-04-16 ENCOUNTER — Telehealth: Payer: Self-pay | Admitting: Internal Medicine

## 2016-04-16 ENCOUNTER — Ambulatory Visit: Payer: Medicaid Other | Admitting: Nutrition

## 2016-04-16 ENCOUNTER — Ambulatory Visit (HOSPITAL_BASED_OUTPATIENT_CLINIC_OR_DEPARTMENT_OTHER): Payer: Medicaid Other | Admitting: Internal Medicine

## 2016-04-16 VITALS — BP 155/52 | HR 78 | Temp 98.4°F | Resp 17 | Ht 63.0 in | Wt 111.5 lb

## 2016-04-16 DIAGNOSIS — Z5112 Encounter for antineoplastic immunotherapy: Secondary | ICD-10-CM | POA: Diagnosis present

## 2016-04-16 DIAGNOSIS — C3431 Malignant neoplasm of lower lobe, right bronchus or lung: Secondary | ICD-10-CM

## 2016-04-16 DIAGNOSIS — E039 Hypothyroidism, unspecified: Secondary | ICD-10-CM

## 2016-04-16 DIAGNOSIS — E038 Other specified hypothyroidism: Secondary | ICD-10-CM | POA: Diagnosis not present

## 2016-04-16 DIAGNOSIS — I10 Essential (primary) hypertension: Secondary | ICD-10-CM

## 2016-04-16 DIAGNOSIS — C3491 Malignant neoplasm of unspecified part of right bronchus or lung: Secondary | ICD-10-CM

## 2016-04-16 DIAGNOSIS — C7951 Secondary malignant neoplasm of bone: Secondary | ICD-10-CM | POA: Diagnosis not present

## 2016-04-16 LAB — COMPREHENSIVE METABOLIC PANEL
ALT: 21 U/L (ref 0–55)
AST: 41 U/L — AB (ref 5–34)
Albumin: 3.8 g/dL (ref 3.5–5.0)
Alkaline Phosphatase: 266 U/L — ABNORMAL HIGH (ref 40–150)
Anion Gap: 10 mEq/L (ref 3–11)
BUN: 12.5 mg/dL (ref 7.0–26.0)
CHLORIDE: 106 meq/L (ref 98–109)
CO2: 23 meq/L (ref 22–29)
CREATININE: 1.1 mg/dL (ref 0.6–1.1)
Calcium: 9.3 mg/dL (ref 8.4–10.4)
EGFR: 60 mL/min/{1.73_m2} — ABNORMAL LOW (ref >90)
GLUCOSE: 112 mg/dL (ref 70–140)
Potassium: 3.7 mEq/L (ref 3.5–5.1)
Sodium: 139 mEq/L (ref 136–145)
Total Bilirubin: 0.24 mg/dL (ref 0.20–1.20)
Total Protein: 8.1 g/dL (ref 6.4–8.3)

## 2016-04-16 LAB — CBC WITH DIFFERENTIAL/PLATELET
BASO%: 0.3 % (ref 0.0–2.0)
Basophils Absolute: 0 10*3/uL (ref 0.0–0.1)
EOS%: 6.8 % (ref 0.0–7.0)
Eosinophils Absolute: 0.5 10*3/uL (ref 0.0–0.5)
HEMATOCRIT: 32.5 % — AB (ref 34.8–46.6)
HGB: 11.2 g/dL — ABNORMAL LOW (ref 11.6–15.9)
LYMPH#: 1.7 10*3/uL (ref 0.9–3.3)
LYMPH%: 21.6 % (ref 14.0–49.7)
MCH: 28.6 pg (ref 25.1–34.0)
MCHC: 34.5 g/dL (ref 31.5–36.0)
MCV: 82.9 fL (ref 79.5–101.0)
MONO#: 0.5 10*3/uL (ref 0.1–0.9)
MONO%: 5.9 % (ref 0.0–14.0)
NEUT%: 65.4 % (ref 38.4–76.8)
NEUTROS ABS: 5 10*3/uL (ref 1.5–6.5)
Platelets: 224 10*3/uL (ref 145–400)
RBC: 3.92 10*6/uL (ref 3.70–5.45)
RDW: 15.2 % — ABNORMAL HIGH (ref 11.2–14.5)
WBC: 7.6 10*3/uL (ref 3.9–10.3)

## 2016-04-16 LAB — TSH: TSH: 7.805 m(IU)/L — ABNORMAL HIGH (ref 0.308–3.960)

## 2016-04-16 MED ORDER — SODIUM CHLORIDE 0.9 % IV SOLN
200.0000 mg | Freq: Once | INTRAVENOUS | Status: AC
  Administered 2016-04-16: 200 mg via INTRAVENOUS
  Filled 2016-04-16: qty 8

## 2016-04-16 MED ORDER — SODIUM CHLORIDE 0.9 % IV SOLN
Freq: Once | INTRAVENOUS | Status: AC
  Administered 2016-04-16: 11:00:00 via INTRAVENOUS

## 2016-04-16 NOTE — Progress Notes (Signed)
[place] [phone]   [phone]  OFFICE PROGRESS NOTE  No PCP Per Patient No address on file  DIAGNOSIS: Stage IV (T1b, N2, M1 B) non-small cell lung cancer, adenocarcinoma with positive PDL 1 expression (99%), presented with right lower lobe lung nodule with metastatic right hilar and subcarinal lymphadenopathy as well as bone metastasis diagnosed in September 2017.  Genomic Alterations Identified? NF1 G341f*9 ARID1B [id number] PBRM1 [id number] TP53 [id number] Additional Findings? Microsatellite status MS-Stable Tumor Mutation Burden TMB-Intermediate; 7 Muts/Mb Additional Disease-relevant Genes with No Reportable Alterations Identified? EGFR KRAS ALK BRAF MET ERBB2 RET ROS1  PRIOR THERAPY: Palliative radiotherapy to the metastatic bone lesion under the care of Dr. KinaSondra ComeURRENT THERAPY: First-line treatment with immunotherapy with Ketruda (pembrolizumab) 200 mg IV every 3 weeks status post 5 cycles. First cycle was given 12/21/2015.  INTERVAL HISTORY: Brittany Mercado. female came to the clinic today for follow-up visit. The patient is currently on treatment with immunotherapy with KetrHungaryry 3 weeks status post 5 cycles. She has been tolerating her treatment well with no significant adverse effects. She denied having any chest pain, shortness of breath, cough or hemoptysis. She has no fever or chills. The patient denied having any significant weight loss or night sweats. She has no nausea or vomiting. She denied having any skin rash. She is here today for evaluation before starting cycle #6 of her treatment.  MEDICAL HISTORY: Past Medical History:  Diagnosis Date  . Adenocarcinoma of right lung, stage 4 (HCC)[place]/05/2015  . Complication of anesthesia    hard to wake up  . COPD (chronic obstructive pulmonary disease) (HCC)[place]. Eczema   . Encounter for antineoplastic chemotherapy 11/30/2015  . Heart murmur    told at age 24  5t never has had any problems  . History of radiation therapy 01/26/16-02/07/16   right pelvis 30 Gy in 10 fractions  . HTN (hypertension) 02/01/2016  . 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 Lung mass   . Pneumonia 09/2015    ALLERGIES:  is allergic to penicillins.  MEDICATIONS:  Current Outpatient Prescriptions  Medication Sig Dispense Refill  . aspirin 325 MG tablet Take 325 mg by mouth daily.    . cetirizine (ZYRTEC) 10 MG tablet Take 1 tablet (10 mg total) by mouth daily. 30 tablet 2  . folic acid (FOLVITE) 1 MG tablet Take 1 tablet (1 mg total) by mouth daily. 30 tablet 4  . hydrocortisone cream 0.5 % Apply 1 application topically 2 (two) times daily. 30 g 2  . levothyroxine (SYNTHROID) 50 MCG tablet Take 1 tablet (50 mcg total) by mouth daily before breakfast. 30 tablet 2  . Multiple Vitamin (MULTIVITAMIN) tablet Take 1 tablet by mouth daily.    . oxMarland KitchenCODONE-acetaminophen (PERCOCET/ROXICET) 5-325 MG tablet Take 1 tablet by mouth every 6 (six) hours as needed for severe pain. 40 tablet 0  . prochlorperazine (COMPAZINE) 10 MG tablet Take 1 tablet (10 mg total) by mouth every 6 (six) hours as needed for nausea or vomiting. 30 tablet 0  . hydrocortisone ointment 0.5 % APP TOPICALLY AA BID  2   No current facility-administered medications for this visit.     SURGICAL HISTORY:  Past Surgical History:  Procedure Laterality Date  . ABDOMINAL HYSTERECTOMY    . OOPHORECTOMY  1996   tumor removed from right ovary  . TONSILLECTOMY    . VIDEO BRONCHOSCOPY WITH ENDOBRONCHIAL NAVIGATION N/A 11/18/2015   Procedure: VIDEO  BRONCHOSCOPY WITH ENDOBRONCHIAL NAVIGATION;  Surgeon: Melrose Nakayama, MD;  Location: [place];  Service: Thoracic;  Laterality: N/A;  . VIDEO BRONCHOSCOPY WITH ENDOBRONCHIAL ULTRASOUND N/A 11/18/2015   Procedure: VIDEO BRONCHOSCOPY WITH ENDOBRONCHIAL ULTRASOUND;  Surgeon: Melrose Nakayama, MD;  Location: [place];  Service: Thoracic;  Laterality: N/A;    REVIEW OF  SYSTEMS:  Constitutional: negative Eyes: negative Ears, nose, mouth, throat, and face: negative Respiratory: negative Cardiovascular: negative Gastrointestinal: negative Genitourinary:negative Integument/breast: negative Hematologic/lymphatic: negative Musculoskeletal:negative Neurological: negative Behavioral/Psych: negative Endocrine: negative Allergic/Immunologic: negative   PHYSICAL EXAMINATION: General appearance: alert, cooperative and no distress Head: Normocephalic, without obvious abnormality, atraumatic Neck: no adenopathy, no JVD, supple, symmetrical, trachea midline and thyroid not enlarged, symmetric, no tenderness/mass/nodules Lymph nodes: Cervical, supraclavicular, and axillary nodes normal. Resp: clear to auscultation bilaterally Back: symmetric, no curvature. ROM normal. No CVA tenderness. Cardio: normal apical impulse GI: soft, non-tender; bowel sounds normal; no masses,  no organomegaly Extremities: extremities normal, atraumatic, no cyanosis or edema Neurologic: Alert and oriented X 3, normal strength and tone. Normal symmetric reflexes. Normal coordination and gait  ECOG PERFORMANCE STATUS: 0 - Asymptomatic  Blood pressure (!) 155/52, pulse 78, temperature 98.4 F (36.9 C), temperature source Oral, resp. rate 17, height '5\' 3"'$  (1.6 m), weight 111 lb 8 oz (50.6 kg), SpO2 100 %.  LABORATORY DATA: Lab Results  Component Value Date   WBC 7.6 04/16/2016   HGB 11.2 (L) 04/16/2016   HCT 32.5 (L) 04/16/2016   MCV 82.9 04/16/2016   PLT 224 04/16/2016      Chemistry      Component Value Date/Time   NA 140 03/26/2016 0936   K 4.0 03/26/2016 0936   CL 103 11/15/2015 1512   CO2 25 03/26/2016 0936   BUN 13.7 03/26/2016 0936   CREATININE 1.3 (H) 03/26/2016 0936      Component Value Date/Time   CALCIUM 9.9 03/26/2016 0936   ALKPHOS 232 (H) 03/26/2016 0936   AST 36 (H) 03/26/2016 0936   ALT 18 03/26/2016 0936   BILITOT 0.25 03/26/2016 0936        RADIOGRAPHIC STUDIES: No results found.  ASSESSMENT AND PLAN:  This is a very pleasant 66 years old African-American female with: 1) metastatic non-small cell lung cancer, adenocarcinoma. She is currently undergoing treatment with immunotherapy with Ketruda status post 5 cycles. The patient has been tolerating her treatment well with no significant adverse effects. I recommended for her to proceed with cycle #6 today as scheduled. I will see her back for follow-up visit in 3 weeks for reevaluation with repeat CT scan of the chest, abdomen and pelvis for restaging of her disease. 2) for the immunotherapy induced hypothyroidism, the patient will continue on her current treatment with levothyroxine 50 g orally and I will continue to monitor her TSH closely. 3) hypertension: The patient continues to have elevated blood pressure. I recommended for her to monitor it closely at home and is still elevated to consult with her primary care physician for consideration for starting treatment. 4) pain management: She will continue on Percocet on as-needed basis. The patient was advised to call immediately if she has any concerning symptoms in the interval. The patient voices understanding of current disease status and treatment options and is in agreement with the current care plan.  All questions were answered. The patient knows to call the clinic with any problems, questions or concerns. We can certainly see the patient much sooner if necessary.  Disclaimer: This note was dictated  with voice recognition software. Similar sounding words can inadvertently be transcribed and may not be corrected upon review.

## 2016-04-16 NOTE — Telephone Encounter (Signed)
Message sent to chemo scheduler to be added per 04/16/16 los. Previously scheduled lab and chemo rescheduled to accommodate follow up appointments with Dr [name], per 04/16/16 los. Patient was given 2 bottles of contrast and copy of instructions, per CT ordered.  Appointments scheduled per 04/16/16 los. Patient was given a copy of the appointment schedule and AVS report, per 04/16/16 los.

## 2016-04-16 NOTE — Progress Notes (Signed)
Nutrition follow-up completed with patient receiving immunotherapy for lung cancer. Weight has improved was documented as 111.5 pounds on February 19 increased from 108.4 pounds February 7. Patient currently drinking an oral nutrition supplement. She reports her fatigue has improved. She denies nutrition impact symptoms.  Nutrition diagnosis:  Unintended weight loss improved.  Intervention: I educated patient to continue Ensure Plus twice a day I provided support and encouragement for patient to continue weight gain. Questions were answered.  Teach back method used.  Monitoring, evaluation, goals: Patient will continue to work to increase calories and protein to minimize weight loss.  Next visit: To be scheduled as needed.  **Disclaimer: This note was dictated with voice recognition software. Similar sounding words can inadvertently be transcribed and this note may contain transcription errors which may not have been corrected upon publication of note.**   .

## 2016-04-16 NOTE — Patient Instructions (Signed)
[facility] Discharge Instructions for Patients Receiving Chemotherapy  Today you received the following chemotherapy agents: Keytruda   To help prevent nausea and vomiting after your treatment, we encourage you to take your nausea medication as directed    If you develop nausea and vomiting that is not controlled by your nausea medication, call the clinic.   BELOW ARE SYMPTOMS THAT SHOULD BE REPORTED IMMEDIATELY:  *FEVER GREATER THAN 100.5 F  *CHILLS WITH OR WITHOUT FEVER  NAUSEA AND VOMITING THAT IS NOT CONTROLLED WITH YOUR NAUSEA MEDICATION  *UNUSUAL SHORTNESS OF BREATH  *UNUSUAL BRUISING OR BLEEDING  TENDERNESS IN MOUTH AND THROAT WITH OR WITHOUT PRESENCE OF ULCERS  *URINARY PROBLEMS  *BOWEL PROBLEMS  UNUSUAL RASH Items with * indicate a potential emergency and should be followed up as soon as possible.  Feel free to call the clinic you have any questions or concerns. The clinic phone number is [phone].  Please show the CHEMO ALERT CARD at check-in to the Emergency Department and triage nurse.   

## 2016-04-16 NOTE — Telephone Encounter (Signed)
Per 2/19 LOS and staff message I have scheduled appts. Gave patient calendar

## 2016-04-25 ENCOUNTER — Ambulatory Visit: Payer: Self-pay | Admitting: Internal Medicine

## 2016-04-25 ENCOUNTER — Ambulatory Visit: Payer: Self-pay

## 2016-04-25 ENCOUNTER — Other Ambulatory Visit: Payer: Self-pay

## 2016-05-02 ENCOUNTER — Other Ambulatory Visit: Payer: Self-pay | Admitting: Internal Medicine

## 2016-05-07 ENCOUNTER — Ambulatory Visit: Payer: Medicaid Other

## 2016-05-07 ENCOUNTER — Telehealth: Payer: Self-pay | Admitting: *Deleted

## 2016-05-07 ENCOUNTER — Encounter: Payer: Self-pay | Admitting: Internal Medicine

## 2016-05-07 ENCOUNTER — Ambulatory Visit (HOSPITAL_BASED_OUTPATIENT_CLINIC_OR_DEPARTMENT_OTHER): Payer: Medicaid Other

## 2016-05-07 ENCOUNTER — Ambulatory Visit (HOSPITAL_COMMUNITY)
Admission: RE | Admit: 2016-05-07 | Discharge: 2016-05-07 | Disposition: A | Discharge status: Home / Self Care | Payer: Medicaid Other | Source: Ambulatory Visit | Attending: Internal Medicine | Admitting: Internal Medicine

## 2016-05-07 ENCOUNTER — Other Ambulatory Visit (HOSPITAL_BASED_OUTPATIENT_CLINIC_OR_DEPARTMENT_OTHER): Payer: Medicaid Other

## 2016-05-07 ENCOUNTER — Other Ambulatory Visit: Payer: Self-pay | Admitting: Internal Medicine

## 2016-05-07 ENCOUNTER — Ambulatory Visit (HOSPITAL_BASED_OUTPATIENT_CLINIC_OR_DEPARTMENT_OTHER): Payer: Medicaid Other | Admitting: Internal Medicine

## 2016-05-07 ENCOUNTER — Other Ambulatory Visit: Payer: Self-pay | Admitting: *Deleted

## 2016-05-07 ENCOUNTER — Other Ambulatory Visit: Payer: Medicaid Other

## 2016-05-07 VITALS — BP 132/65 | HR 80 | Temp 98.2°F | Resp 18 | Wt 109.4 lb

## 2016-05-07 DIAGNOSIS — C3431 Malignant neoplasm of lower lobe, right bronchus or lung: Secondary | ICD-10-CM

## 2016-05-07 DIAGNOSIS — I700 Atherosclerosis of aorta: Secondary | ICD-10-CM | POA: Insufficient documentation

## 2016-05-07 DIAGNOSIS — C3491 Malignant neoplasm of unspecified part of right bronchus or lung: Secondary | ICD-10-CM | POA: Diagnosis present

## 2016-05-07 DIAGNOSIS — R590 Localized enlarged lymph nodes: Secondary | ICD-10-CM | POA: Diagnosis not present

## 2016-05-07 DIAGNOSIS — E039 Hypothyroidism, unspecified: Secondary | ICD-10-CM | POA: Diagnosis not present

## 2016-05-07 DIAGNOSIS — M899 Disorder of bone, unspecified: Secondary | ICD-10-CM | POA: Insufficient documentation

## 2016-05-07 DIAGNOSIS — I10 Essential (primary) hypertension: Secondary | ICD-10-CM

## 2016-05-07 DIAGNOSIS — I2510 Atherosclerotic heart disease of native coronary artery without angina pectoris: Secondary | ICD-10-CM | POA: Insufficient documentation

## 2016-05-07 DIAGNOSIS — C7951 Secondary malignant neoplasm of bone: Secondary | ICD-10-CM

## 2016-05-07 DIAGNOSIS — Z5112 Encounter for antineoplastic immunotherapy: Secondary | ICD-10-CM

## 2016-05-07 DIAGNOSIS — J439 Emphysema, unspecified: Secondary | ICD-10-CM | POA: Insufficient documentation

## 2016-05-07 LAB — CBC WITH DIFFERENTIAL/PLATELET
BASO%: 0.7 % (ref 0.0–2.0)
BASOS ABS: 0.1 10*3/uL (ref 0.0–0.1)
EOS ABS: 1.1 10*3/uL — AB (ref 0.0–0.5)
EOS%: 9.2 % — AB (ref 0.0–7.0)
HEMATOCRIT: 39.4 % (ref 34.8–46.6)
HEMOGLOBIN: 13.4 g/dL (ref 11.6–15.9)
LYMPH#: 1.8 10*3/uL (ref 0.9–3.3)
LYMPH%: 15.3 % (ref 14.0–49.7)
MCH: 28.8 pg (ref 25.1–34.0)
MCHC: 33.9 g/dL (ref 31.5–36.0)
MCV: 84.8 fL (ref 79.5–101.0)
MONO#: 0.5 10*3/uL (ref 0.1–0.9)
MONO%: 4.6 % (ref 0.0–14.0)
NEUT#: 8.2 10*3/uL — ABNORMAL HIGH (ref 1.5–6.5)
NEUT%: 70.2 % (ref 38.4–76.8)
PLATELETS: 285 10*3/uL (ref 145–400)
RBC: 4.65 10*6/uL (ref 3.70–5.45)
RDW: 14.6 % — AB (ref 11.2–14.5)
WBC: 11.7 10*3/uL — ABNORMAL HIGH (ref 3.9–10.3)

## 2016-05-07 LAB — COMPREHENSIVE METABOLIC PANEL
ALBUMIN: 4.2 g/dL (ref 3.5–5.0)
ALK PHOS: 272 U/L — AB (ref 40–150)
ALT: 22 U/L (ref 0–55)
ANION GAP: 11 meq/L (ref 3–11)
AST: 48 U/L — AB (ref 5–34)
BUN: 11 mg/dL (ref 7.0–26.0)
CALCIUM: 10.1 mg/dL (ref 8.4–10.4)
CO2: 27 mEq/L (ref 22–29)
CREATININE: 1.4 mg/dL — AB (ref 0.6–1.1)
Chloride: 101 mEq/L (ref 98–109)
EGFR: 48 mL/min/{1.73_m2} — ABNORMAL LOW (ref >90)
Glucose: 76 mg/dl (ref 70–140)
POTASSIUM: 4.2 meq/L (ref 3.5–5.1)
Sodium: 139 mEq/L (ref 136–145)
Total Bilirubin: 0.32 mg/dL (ref 0.20–1.20)
Total Protein: 9.2 g/dL — ABNORMAL HIGH (ref 6.4–8.3)

## 2016-05-07 LAB — TSH: TSH: 48.768 m[IU]/L — AB (ref 0.308–3.960)

## 2016-05-07 MED ORDER — SODIUM CHLORIDE 0.9 % IV SOLN
Freq: Once | INTRAVENOUS | Status: AC
  Administered 2016-05-07: 13:00:00 via INTRAVENOUS

## 2016-05-07 MED ORDER — LEVOTHYROXINE SODIUM 100 MCG PO TABS: 100.0000 ug | ORAL_TABLET | Freq: Every day | ORAL | 1 refills | Status: DC

## 2016-05-07 MED ORDER — LEVOTHYROXINE SODIUM 50 MCG PO TABS: 50.0000 ug | ORAL_TABLET | Freq: Every day | ORAL | 2 refills | Status: DC

## 2016-05-07 MED ORDER — IOPAMIDOL (ISOVUE-300) INJECTION 61%
100.0000 mL | Freq: Once | INTRAVENOUS | Status: AC | PRN
  Administered 2016-05-07: 80 mL via INTRAVENOUS

## 2016-05-07 MED ORDER — SODIUM CHLORIDE 0.9 % IV SOLN
200.0000 mg | Freq: Once | INTRAVENOUS | Status: AC
  Administered 2016-05-07: 200 mg via INTRAVENOUS
  Filled 2016-05-07: qty 8

## 2016-05-07 MED ORDER — IOPAMIDOL (ISOVUE-300) INJECTION 61%
INTRAVENOUS | Status: AC
  Filled 2016-05-07: qty 100

## 2016-05-07 NOTE — Telephone Encounter (Signed)
Per Dr. [name], I informed patient that he was increasing her Synthroid dose to 100 mcg daily. A new script was e-scribed to [facility] in [place]. Patient verbalized understanding.

## 2016-05-07 NOTE — Progress Notes (Signed)
     [facility] [phone]   [phone]  OFFICE PROGRESS NOTE  No PCP Per Patient No address on file  DIAGNOSIS: Stage IV (T1b, N2, M1 B) non-small cell lung cancer, adenocarcinoma with positive PDL 1 expression (99%), presented with right lower lobe lung nodule with metastatic right hilar and subcarinal lymphadenopathy as well as bone metastasis diagnosed in September 2017.  Genomic Alterations Identified? NF1 G367fs*9 ARID1B K17fs*189 PBRM1 R512fs*4 TP53 P72fs*76 Additional Findings? Microsatellite status MS-Stable Tumor Mutation Burden TMB-Intermediate; 7 Muts/Mb Additional Disease-relevant Genes with No Reportable Alterations Identified? EGFR KRAS ALK BRAF MET ERBB2 RET ROS1  PRIOR THERAPY: Palliative radiotherapy to the metastatic bone lesion under the care of Dr. Kinard.  CURRENT THERAPY: First-line treatment with immunotherapy with Ketruda (pembrolizumab) 200 mg IV every 3 weeks status post 6 cycles. First cycle was given 12/21/2015.  INTERVAL HISTORY: Brittany Mercado 66 y.o. female came to the clinic today for follow-up visit. The patient is feeling fine today with no specific complaints. She is currently on treatment with Ketruda status post 6 cycles and tolerating her treatment well. She denied having any chest pain, shortness of breath, cough or hemoptysis. She lost 2 pounds since her last visit. She denied having any fever or chills. She has no nausea, vomiting, diarrhea or constipation. She was supposed to have repeat CT scan of the chest, abdomen and pelvis before her visit today but unfortunately there was delay in the preauthorization and her scan was just approved earlier today. She is here today for evaluation before starting cycle #7.   MEDICAL HISTORY: Past Medical History:  Diagnosis Date  . Adenocarcinoma of right lung, stage 4 (HCC) 11/30/2015  . Complication of anesthesia    hard to wake up  . COPD (chronic  obstructive pulmonary disease) (HCC)   . Eczema   . Encounter for antineoplastic chemotherapy 11/30/2015  . Heart murmur    told at age 15 but never has had any problems  . History of radiation therapy 01/26/16-02/07/16   right pelvis 30 Gy in 10 fractions  . HTN (hypertension) 02/01/2016  . 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 Lung mass   . Pneumonia 09/2015    ALLERGIES:  is allergic to penicillins.  MEDICATIONS:  Current Outpatient Prescriptions  Medication Sig Dispense Refill  . aspirin 325 MG tablet Take 325 mg by mouth daily.    . cetirizine (ZYRTEC) 10 MG tablet Take 1 tablet (10 mg total) by mouth daily. 30 tablet 2  . hydrocortisone cream 0.5 % Apply 1 application topically 2 (two) times daily. 30 g 2  . Multiple Vitamin (MULTIVITAMIN) tablet Take 1 tablet by mouth daily.    . folic acid (FOLVITE) 1 MG tablet Take 1 tablet (1 mg total) by mouth daily. (Patient not taking: Reported on 05/07/2016) 30 tablet 4  . levothyroxine (SYNTHROID) 50 MCG tablet Take 1 tablet (50 mcg total) by mouth daily before breakfast. (Patient not taking: Reported on 05/07/2016) 30 tablet 2  . oxyCODONE-acetaminophen (PERCOCET/ROXICET) 5-325 MG tablet Take 1 tablet by mouth every 6 (six) hours as needed for severe pain. (Patient not taking: Reported on 05/07/2016) 40 tablet 0  . prochlorperazine (COMPAZINE) 10 MG tablet Take 1 tablet (10 mg total) by mouth every 6 (six) hours as needed for nausea or vomiting. (Patient not taking: Reported on 05/07/2016) 30 tablet 0   No current facility-administered medications for this visit.     SURGICAL HISTORY:  Past Surgical History:  Procedure Laterality Date  .   ABDOMINAL HYSTERECTOMY    . OOPHORECTOMY  1996   tumor removed from right ovary  . TONSILLECTOMY    . VIDEO BRONCHOSCOPY WITH ENDOBRONCHIAL NAVIGATION N/A 11/18/2015   Procedure: VIDEO BRONCHOSCOPY WITH ENDOBRONCHIAL NAVIGATION;  Surgeon: Melrose Nakayama, MD;  Location: [place];  Service: Thoracic;   Laterality: N/A;  . VIDEO BRONCHOSCOPY WITH ENDOBRONCHIAL ULTRASOUND N/A 11/18/2015   Procedure: VIDEO BRONCHOSCOPY WITH ENDOBRONCHIAL ULTRASOUND;  Surgeon: Melrose Nakayama, MD;  Location: [place];  Service: Thoracic;  Laterality: N/A;    REVIEW OF SYSTEMS:  A comprehensive review of systems was negative.   PHYSICAL EXAMINATION: General appearance: alert, cooperative and no distress Head: Normocephalic, without obvious abnormality, atraumatic Neck: no adenopathy, no JVD, supple, symmetrical, trachea midline and thyroid not enlarged, symmetric, no tenderness/mass/nodules Lymph nodes: Cervical, supraclavicular, and axillary nodes normal. Resp: clear to auscultation bilaterally Back: symmetric, no curvature. ROM normal. No CVA tenderness. Cardio: normal apical impulse GI: soft, non-tender; bowel sounds normal; no masses,  no organomegaly Extremities: extremities normal, atraumatic, no cyanosis or edema  ECOG PERFORMANCE STATUS: 0 - Asymptomatic  Blood pressure 132/65, pulse 80, temperature 98.2 F (36.8 C), temperature source Oral, resp. rate 18, weight 109 lb 6.4 oz (49.6 kg), SpO2 99 %.  LABORATORY DATA: Lab Results  Component Value Date   WBC 11.7 (H) 05/07/2016   HGB 13.4 05/07/2016   HCT 39.4 05/07/2016   MCV 84.8 05/07/2016   PLT 285 05/07/2016      Chemistry      Component Value Date/Time   NA 139 05/07/2016 0959   K 4.2 05/07/2016 0959   CL 103 11/15/2015 1512   CO2 27 05/07/2016 0959   BUN 11.0 05/07/2016 0959   CREATININE 1.4 (H) 05/07/2016 0959      Component Value Date/Time   CALCIUM 10.1 05/07/2016 0959   ALKPHOS 272 (H) 05/07/2016 0959   AST 48 (H) 05/07/2016 0959   ALT 22 05/07/2016 0959   BILITOT 0.32 05/07/2016 0959       RADIOGRAPHIC STUDIES: No results found.  ASSESSMENT AND PLAN:  This is a very pleasant 66 years old African-American female was metastatic non-small cell lung cancer, adenocarcinoma with positive PDL 1 expression 99%. The  patient is currently undergoing treatment with immunotherapy with Ketruda (pembrolizumab) status post 6 cycles. She was supposed to have repeat CT scan of the chest, abdomen and pelvis before this visit for restaging of her disease. I will arrange for the patient to go back to the radiology Department to have her scan done before starting cycle #7. If the scan showed no evidence for disease progression, she will proceed with cycle #7 today. She would come back for follow-up visit in 3 weeks for reevaluation before starting cycle #8. For the hypothyroidism, I will refill her Synthroid today. The patient was advised to call immediately if she has any concerning symptoms in the interval. The patient voices understanding of current disease status and treatment options and is in agreement with the current care plan. All questions were answered. The patient knows to call the clinic with any problems, questions or concerns. We can certainly see the patient much sooner if necessary. I spent 10 minutes counseling the patient face to face. The total time spent in the appointment was 15 minutes.  Disclaimer: This note was dictated with voice recognition software. Similar sounding words can inadvertently be transcribed and may not be corrected upon review.

## 2016-05-07 NOTE — Patient Instructions (Signed)
[facility] Discharge Instructions for Patients   Today you received the following: Keytruda.  To help prevent nausea and vomiting after your treatment, we encourage you to take your nausea medication as directed.   If you develop nausea and vomiting that is not controlled by your nausea medication, call the clinic.   BELOW ARE SYMPTOMS THAT SHOULD BE REPORTED IMMEDIATELY:  *FEVER GREATER THAN 100.5 F  *CHILLS WITH OR WITHOUT FEVER  NAUSEA AND VOMITING THAT IS NOT CONTROLLED WITH YOUR NAUSEA MEDICATION  *UNUSUAL SHORTNESS OF BREATH  *UNUSUAL BRUISING OR BLEEDING  TENDERNESS IN MOUTH AND THROAT WITH OR WITHOUT PRESENCE OF ULCERS  *URINARY PROBLEMS  *BOWEL PROBLEMS  UNUSUAL RASH Items with * indicate a potential emergency and should be followed up as soon as possible.  Feel free to call the clinic you have any questions or concerns. The clinic phone number is [phone].  Please show the CHEMO ALERT CARD at check-in to the Emergency Department and triage nurse.   

## 2016-05-16 ENCOUNTER — Other Ambulatory Visit: Payer: Self-pay | Admitting: Internal Medicine

## 2016-05-16 ENCOUNTER — Ambulatory Visit: Payer: Self-pay

## 2016-05-16 ENCOUNTER — Ambulatory Visit: Payer: Self-pay | Admitting: Internal Medicine

## 2016-05-16 ENCOUNTER — Other Ambulatory Visit: Payer: Self-pay

## 2016-05-28 ENCOUNTER — Ambulatory Visit: Payer: Medicaid Other

## 2016-05-28 ENCOUNTER — Ambulatory Visit (HOSPITAL_BASED_OUTPATIENT_CLINIC_OR_DEPARTMENT_OTHER): Payer: Medicaid Other | Admitting: Internal Medicine

## 2016-05-28 ENCOUNTER — Other Ambulatory Visit: Payer: Medicaid Other

## 2016-05-28 ENCOUNTER — Encounter: Payer: Self-pay | Admitting: Internal Medicine

## 2016-05-28 ENCOUNTER — Ambulatory Visit (HOSPITAL_BASED_OUTPATIENT_CLINIC_OR_DEPARTMENT_OTHER): Payer: Medicaid Other

## 2016-05-28 ENCOUNTER — Other Ambulatory Visit (HOSPITAL_BASED_OUTPATIENT_CLINIC_OR_DEPARTMENT_OTHER): Payer: Medicaid Other

## 2016-05-28 ENCOUNTER — Ambulatory Visit: Payer: Medicaid Other | Admitting: Nutrition

## 2016-05-28 VITALS — BP 159/53 | HR 82 | Temp 98.2°F | Resp 18 | Ht 63.0 in | Wt 112.3 lb

## 2016-05-28 DIAGNOSIS — E039 Hypothyroidism, unspecified: Secondary | ICD-10-CM

## 2016-05-28 DIAGNOSIS — C7951 Secondary malignant neoplasm of bone: Secondary | ICD-10-CM

## 2016-05-28 DIAGNOSIS — C3491 Malignant neoplasm of unspecified part of right bronchus or lung: Secondary | ICD-10-CM

## 2016-05-28 DIAGNOSIS — I10 Essential (primary) hypertension: Secondary | ICD-10-CM

## 2016-05-28 DIAGNOSIS — C3431 Malignant neoplasm of lower lobe, right bronchus or lung: Secondary | ICD-10-CM

## 2016-05-28 DIAGNOSIS — Z5112 Encounter for antineoplastic immunotherapy: Secondary | ICD-10-CM | POA: Diagnosis present

## 2016-05-28 LAB — CBC WITH DIFFERENTIAL/PLATELET
BASO%: 0.6 % (ref 0.0–2.0)
BASOS ABS: 0 10*3/uL (ref 0.0–0.1)
EOS%: 8.3 % — ABNORMAL HIGH (ref 0.0–7.0)
Eosinophils Absolute: 0.6 10*3/uL — ABNORMAL HIGH (ref 0.0–0.5)
HEMATOCRIT: 33.5 % — AB (ref 34.8–46.6)
HGB: 11.7 g/dL (ref 11.6–15.9)
LYMPH%: 20.9 % (ref 14.0–49.7)
MCH: 29.2 pg (ref 25.1–34.0)
MCHC: 34.9 g/dL (ref 31.5–36.0)
MCV: 83.5 fL (ref 79.5–101.0)
MONO#: 0.4 10*3/uL (ref 0.1–0.9)
MONO%: 5.8 % (ref 0.0–14.0)
NEUT#: 4.6 10*3/uL (ref 1.5–6.5)
NEUT%: 64.4 % (ref 38.4–76.8)
Platelets: 250 10*3/uL (ref 145–400)
RBC: 4.01 10*6/uL (ref 3.70–5.45)
RDW: 14.4 % (ref 11.2–14.5)
WBC: 7.1 10*3/uL (ref 3.9–10.3)
lymph#: 1.5 10*3/uL (ref 0.9–3.3)

## 2016-05-28 LAB — COMPREHENSIVE METABOLIC PANEL
ALBUMIN: 3.6 g/dL (ref 3.5–5.0)
ALK PHOS: 273 U/L — AB (ref 40–150)
ALT: 31 U/L (ref 0–55)
ANION GAP: 10 meq/L (ref 3–11)
AST: 48 U/L — ABNORMAL HIGH (ref 5–34)
BILIRUBIN TOTAL: 0.24 mg/dL (ref 0.20–1.20)
BUN: 10.1 mg/dL (ref 7.0–26.0)
CO2: 24 meq/L (ref 22–29)
CREATININE: 1 mg/dL (ref 0.6–1.1)
Calcium: 9.3 mg/dL (ref 8.4–10.4)
Chloride: 106 mEq/L (ref 98–109)
EGFR: 69 mL/min/{1.73_m2} — ABNORMAL LOW (ref >90)
Glucose: 115 mg/dl (ref 70–140)
Potassium: 4.2 mEq/L (ref 3.5–5.1)
Sodium: 140 mEq/L (ref 136–145)
TOTAL PROTEIN: 8 g/dL (ref 6.4–8.3)

## 2016-05-28 LAB — TSH: TSH: 0.437 m(IU)/L (ref 0.308–3.960)

## 2016-05-28 MED ORDER — SODIUM CHLORIDE 0.9 % IV SOLN
Freq: Once | INTRAVENOUS | Status: AC
  Administered 2016-05-28: 13:00:00 via INTRAVENOUS

## 2016-05-28 MED ORDER — SODIUM CHLORIDE 0.9 % IV SOLN
200.0000 mg | Freq: Once | INTRAVENOUS | Status: AC
  Administered 2016-05-28: 200 mg via INTRAVENOUS
  Filled 2016-05-28: qty 8

## 2016-05-28 NOTE — Progress Notes (Signed)
Patient left chemotherapy before I could follow up with her.  RN reports patient had no nutrition complaints. Weight improved to 112 pounds, increased from 109 pounds March 12. Will continue to monitor patient as needed.

## 2016-05-28 NOTE — Progress Notes (Signed)
[place] [phone]   Fax:([phone]  OFFICE PROGRESS NOTE  No PCP Per Patient No address on file  DIAGNOSIS: Stage IV (T1b, N2, M1 B) non-small cell lung cancer, adenocarcinoma with positive PDL 1 expression (99%), presented with right lower lobe lung nodule with metastatic right hilar and subcarinal lymphadenopathy as well as bone metastasis diagnosed in September 2017.  Genomic Alterations Identified? NF1 G339f*9 ARID1B [id number] PBRM1 [id number] TP53 P72f71f Additional Findings? Microsatellite status MS-Stable Tumor Mutation Burden TMB-Intermediate; 7 Muts/Mb Additional Disease-relevant Genes with No Reportable Alterations Identified? EGFR KRAS ALK BRAF MET ERBB2 RET ROS1  PRIOR THERAPY: Palliative radiotherapy to the metastatic bone lesion under the care of Dr. KinaSondra ComeURRENT THERAPY: First-line treatment with immunotherapy with Ketruda (pembrolizumab) 200 mg IV every 3 weeks status post 7 cycles. First cycle was given 12/21/2015.  INTERVAL HISTORY: Brittany Radloffy66. female returns to the clinic today for follow-up visit. The patient is currently undergoing treatment with immunotherapy with Ketruda (pembrolizumab) status post 7 cycles and has been tolerating the treatment well with no significant adverse effects. She denied having any chest pain, shortness of breath, cough or hemoptysis. She has no fever or chills. She denied having any weight loss or night sweats. She has no fever or chills. She had repeat CT scan of the chest, abdomen and pelvis performed recently and she is here for evaluation and discussion of her lab results.   MEDICAL HISTORY: Past Medical History:  Diagnosis Date  . Adenocarcinoma of right lung, stage 4 (HCC)[place]/05/2015  . Complication of anesthesia    hard to wake up  . COPD (chronic obstructive pulmonary disease) (HCC)[place]. Eczema   . Encounter for antineoplastic chemotherapy 11/30/2015  . Heart  murmur    told at age 66 b55 never has had any problems  . History of radiation therapy 01/26/16-02/07/16   right pelvis 30 Gy in 10 fractions  . HTN (hypertension) 02/01/2016  . 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 Lung mass   . Pneumonia 09/2015    ALLERGIES:  is allergic to penicillins.  MEDICATIONS:  Current Outpatient Prescriptions  Medication Sig Dispense Refill  . aspirin 325 MG tablet Take 325 mg by mouth daily.    . cetirizine (ZYRTEC) 10 MG tablet Take 1 tablet (10 mg total) by mouth daily. 30 tablet 2  . folic acid (FOLVITE) 1 MG tablet Take 1 tablet (1 mg total) by mouth daily. (Patient not taking: Reported on 05/07/2016) 30 tablet 4  . hydrocortisone cream 0.5 % Apply 1 application topically 2 (two) times daily. 30 g 2  . levothyroxine (SYNTHROID, LEVOTHROID) 100 MCG tablet Take 1 tablet (100 mcg total) by mouth daily before breakfast. 30 tablet 1  . Multiple Vitamin (MULTIVITAMIN) tablet Take 1 tablet by mouth daily.    . oxMarland KitchenCODONE-acetaminophen (PERCOCET/ROXICET) 5-325 MG tablet Take 1 tablet by mouth every 6 (six) hours as needed for severe pain. (Patient not taking: Reported on 05/07/2016) 40 tablet 0  . prochlorperazine (COMPAZINE) 10 MG tablet Take 1 tablet (10 mg total) by mouth every 6 (six) hours as needed for nausea or vomiting. (Patient not taking: Reported on 05/07/2016) 30 tablet 0   No current facility-administered medications for this visit.     SURGICAL HISTORY:  Past Surgical History:  Procedure Laterality Date  . ABDOMINAL HYSTERECTOMY    . OOPHORECTOMY  1996   tumor removed from right ovary  . TONSILLECTOMY    . VIDEO  BRONCHOSCOPY WITH ENDOBRONCHIAL NAVIGATION N/A 11/18/2015   Procedure: VIDEO BRONCHOSCOPY WITH ENDOBRONCHIAL NAVIGATION;  Surgeon: Melrose Nakayama, MD;  Location: [place];  Service: Thoracic;  Laterality: N/A;  . VIDEO BRONCHOSCOPY WITH ENDOBRONCHIAL ULTRASOUND N/A 11/18/2015   Procedure: VIDEO BRONCHOSCOPY WITH ENDOBRONCHIAL  ULTRASOUND;  Surgeon: Melrose Nakayama, MD;  Location: [place];  Service: Thoracic;  Laterality: N/A;    REVIEW OF SYSTEMS:  Constitutional: negative Eyes: negative Ears, nose, mouth, throat, and face: negative Respiratory: negative Cardiovascular: negative Gastrointestinal: negative Genitourinary:negative Integument/breast: negative Hematologic/lymphatic: negative Musculoskeletal:negative Neurological: negative Behavioral/Psych: negative Endocrine: negative Allergic/Immunologic: negative   PHYSICAL EXAMINATION: General appearance: alert, cooperative and no distress Head: Normocephalic, without obvious abnormality, atraumatic Neck: no adenopathy, no JVD, supple, symmetrical, trachea midline and thyroid not enlarged, symmetric, no tenderness/mass/nodules Lymph nodes: Cervical, supraclavicular, and axillary nodes normal. Resp: clear to auscultation bilaterally Back: symmetric, no curvature. ROM normal. No CVA tenderness. Cardio: normal apical impulse GI: soft, non-tender; bowel sounds normal; no masses,  no organomegaly Extremities: extremities normal, atraumatic, no cyanosis or edema Neurologic: Alert and oriented X 3, normal strength and tone. Normal symmetric reflexes. Normal coordination and gait  ECOG PERFORMANCE STATUS: 0 - Asymptomatic  Blood pressure (!) 159/53, pulse 82, temperature 98.2 F (36.8 C), temperature source Oral, resp. rate 18, height 5' 3"  (1.6 m), weight 112 lb 4.8 oz (50.9 kg), SpO2 100 %.  LABORATORY DATA: Lab Results  Component Value Date   WBC 7.1 05/28/2016   HGB 11.7 05/28/2016   HCT 33.5 (L) 05/28/2016   MCV 83.5 05/28/2016   PLT 250 05/28/2016      Chemistry      Component Value Date/Time   NA 139 05/07/2016 0959   K 4.2 05/07/2016 0959   CL 103 11/15/2015 1512   CO2 27 05/07/2016 0959   BUN 11.0 05/07/2016 0959   CREATININE 1.4 (H) 05/07/2016 0959      Component Value Date/Time   CALCIUM 10.1 05/07/2016 0959   ALKPHOS 272 (H)  05/07/2016 0959   AST 48 (H) 05/07/2016 0959   ALT 22 05/07/2016 0959   BILITOT 0.32 05/07/2016 0959       RADIOGRAPHIC STUDIES: Ct Chest W Contrast  Result Date: 05/07/2016 CLINICAL DATA:  Stage IV right lung adenocarcinoma. Currently undergoing immunotherapy. Restaging. EXAM: CT CHEST, ABDOMEN, AND PELVIS WITH CONTRAST TECHNIQUE: Multidetector CT imaging of the chest, abdomen and pelvis was performed following the standard protocol during bolus administration of intravenous contrast. CONTRAST:  29m ISOVUE-300 IOPAMIDOL (ISOVUE-300) INJECTION 61% COMPARISON:  02/21/2016 FINDINGS: CT CHEST FINDINGS Cardiovascular: No acute findings. Aortic and coronary artery atherosclerosis. Mediastinum/Lymph Nodes: 11 mm right hilar lymph node is seen on image 31/2, compared with 7 mm on previous study. No other pathologically enlarged lymph nodes identified. Lungs/Pleura: Moderate to severe emphysema again demonstrated. Ill-defined masslike area consolidation in the superior segment of the right lower lobe currently measures 2.1 x 1.0 cm on image 68/4 compared to 2.2 x 0.9 cm when measured in the same planes on previous exam. No new or enlarging pulmonary nodules or masses identified. No evidence of pleural effusion. Musculoskeletal:  No suspicious bone lesions identified. CT ABDOMEN AND PELVIS FINDINGS Hepatobiliary: No masses identified. Pancreas:  No mass or inflammatory changes. Spleen:  Within normal limits in size and appearance. Adrenals/Urinary tract: No masses or hydronephrosis. Scarring involving the lower pole of the right kidney remains stable. Stomach/Bowel: No evidence of obstruction, inflammatory process, or abnormal fluid collections. Vascular/Lymphatic: No pathologically enlarged lymph nodes identified. No abdominal  aortic aneurysm. Aortic atherosclerosis. Reproductive: Prior hysterectomy noted. Adnexal regions are unremarkable in appearance. Other:  None. Musculoskeletal: Lytic bone lesion at  junction of right acetabulum and ischium measures 1.4 cm and shows no significant change compared previous study. No other suspicious bone lesions identified. IMPRESSION: 11 mm right hilar lymph node, increased in size from 7 mm on previous study. No significant change in 2 cm masslike consolidation in right lower lobe. Stable small right pelvic lytic bone lesion. Moderate to severe emphysema. Aortic and coronary artery atherosclerosis. Electronically Signed   By: Earle Gell M.D.   On: 05/07/2016 14:33   Ct Abdomen Pelvis W Contrast  Result Date: 05/07/2016 CLINICAL DATA:  Stage IV right lung adenocarcinoma. Currently undergoing immunotherapy. Restaging. EXAM: CT CHEST, ABDOMEN, AND PELVIS WITH CONTRAST TECHNIQUE: Multidetector CT imaging of the chest, abdomen and pelvis was performed following the standard protocol during bolus administration of intravenous contrast. CONTRAST:  56m ISOVUE-300 IOPAMIDOL (ISOVUE-300) INJECTION 61% COMPARISON:  02/21/2016 FINDINGS: CT CHEST FINDINGS Cardiovascular: No acute findings. Aortic and coronary artery atherosclerosis. Mediastinum/Lymph Nodes: 11 mm right hilar lymph node is seen on image 31/2, compared with 7 mm on previous study. No other pathologically enlarged lymph nodes identified. Lungs/Pleura: Moderate to severe emphysema again demonstrated. Ill-defined masslike area consolidation in the superior segment of the right lower lobe currently measures 2.1 x 1.0 cm on image 68/4 compared to 2.2 x 0.9 cm when measured in the same planes on previous exam. No new or enlarging pulmonary nodules or masses identified. No evidence of pleural effusion. Musculoskeletal:  No suspicious bone lesions identified. CT ABDOMEN AND PELVIS FINDINGS Hepatobiliary: No masses identified. Pancreas:  No mass or inflammatory changes. Spleen:  Within normal limits in size and appearance. Adrenals/Urinary tract: No masses or hydronephrosis. Scarring involving the lower pole of the right kidney  remains stable. Stomach/Bowel: No evidence of obstruction, inflammatory process, or abnormal fluid collections. Vascular/Lymphatic: No pathologically enlarged lymph nodes identified. No abdominal aortic aneurysm. Aortic atherosclerosis. Reproductive: Prior hysterectomy noted. Adnexal regions are unremarkable in appearance. Other:  None. Musculoskeletal: Lytic bone lesion at junction of right acetabulum and ischium measures 1.4 cm and shows no significant change compared previous study. No other suspicious bone lesions identified. IMPRESSION: 11 mm right hilar lymph node, increased in size from 7 mm on previous study. No significant change in 2 cm masslike consolidation in right lower lobe. Stable small right pelvic lytic bone lesion. Moderate to severe emphysema. Aortic and coronary artery atherosclerosis. Electronically Signed   By: JEarle GellM.D.   On: 05/07/2016 14:33    ASSESSMENT AND PLAN:  This is a very pleasant 65years old African-American female with metastatic non-small cell lung cancer, adenocarcinoma and positive PDL 1 expression of 99% The patient is currently on treatment with immunotherapy with Ketruda (pembrolizumab) status post 7 cycles. She is tolerating her treatment well with no significant adverse effects. She had repeat CT scan of the chest, abdomen and pelvis performed a few weeks ago. I personally and independently reviewed the scan images and discuss the results with the patient today. Her scan showed mild increase in right hilar lymph node. I recommended for the patient to continue her current treatment with immunotherapy for now and I will monitor the right hilar lymph node closely on upcoming scan. For hypertension, I strongly recommended for the patient to monitor her blood pressure closely at home and to report to her primary care physician is stated elevated. For the hypothyroidism, she will continue her current  treatment with levothyroxine. I will see the patient back  for follow-up visit in 3 weeks for evaluation with the start of cycle #9. She was advised to call immediately if she has any concerning symptoms in the interval. The patient voices understanding of current disease status and treatment options and is in agreement with the current care plan. All questions were answered. The patient knows to call the clinic with any problems, questions or concerns. We can certainly see the patient much sooner if necessary.  Disclaimer: This note was dictated with voice recognition software. Similar sounding words can inadvertently be transcribed and may not be corrected upon review.

## 2016-05-28 NOTE — Patient Instructions (Signed)
[facility] Discharge Instructions for Patients Receiving Chemotherapy  Today you received the following chemotherapy agents: Keytruda   To help prevent nausea and vomiting after your treatment, we encourage you to take your nausea medication as directed    If you develop nausea and vomiting that is not controlled by your nausea medication, call the clinic.   BELOW ARE SYMPTOMS THAT SHOULD BE REPORTED IMMEDIATELY:  *FEVER GREATER THAN 100.5 F  *CHILLS WITH OR WITHOUT FEVER  NAUSEA AND VOMITING THAT IS NOT CONTROLLED WITH YOUR NAUSEA MEDICATION  *UNUSUAL SHORTNESS OF BREATH  *UNUSUAL BRUISING OR BLEEDING  TENDERNESS IN MOUTH AND THROAT WITH OR WITHOUT PRESENCE OF ULCERS  *URINARY PROBLEMS  *BOWEL PROBLEMS  UNUSUAL RASH Items with * indicate a potential emergency and should be followed up as soon as possible.  Feel free to call the clinic you have any questions or concerns. The clinic phone number is [phone].  Please show the CHEMO ALERT CARD at check-in to the Emergency Department and triage nurse.   

## 2016-05-29 ENCOUNTER — Telehealth: Payer: Self-pay | Admitting: Internal Medicine

## 2016-05-29 NOTE — Telephone Encounter (Signed)
Patient is aware of additional appts added. Patient said she will pick up a new schedule the next time she comes in ,.

## 2016-06-18 ENCOUNTER — Ambulatory Visit (HOSPITAL_BASED_OUTPATIENT_CLINIC_OR_DEPARTMENT_OTHER): Payer: Medicaid Other

## 2016-06-18 ENCOUNTER — Telehealth: Payer: Self-pay | Admitting: Internal Medicine

## 2016-06-18 ENCOUNTER — Ambulatory Visit (HOSPITAL_BASED_OUTPATIENT_CLINIC_OR_DEPARTMENT_OTHER): Payer: Medicaid Other | Admitting: Internal Medicine

## 2016-06-18 ENCOUNTER — Other Ambulatory Visit (HOSPITAL_BASED_OUTPATIENT_CLINIC_OR_DEPARTMENT_OTHER): Payer: Medicaid Other

## 2016-06-18 ENCOUNTER — Encounter: Payer: Self-pay | Admitting: Internal Medicine

## 2016-06-18 VITALS — BP 155/49 | HR 81 | Temp 98.2°F | Resp 17 | Ht 63.0 in | Wt 111.7 lb

## 2016-06-18 DIAGNOSIS — C3431 Malignant neoplasm of lower lobe, right bronchus or lung: Secondary | ICD-10-CM | POA: Diagnosis not present

## 2016-06-18 DIAGNOSIS — C3491 Malignant neoplasm of unspecified part of right bronchus or lung: Secondary | ICD-10-CM

## 2016-06-18 DIAGNOSIS — C7951 Secondary malignant neoplasm of bone: Secondary | ICD-10-CM

## 2016-06-18 DIAGNOSIS — E039 Hypothyroidism, unspecified: Secondary | ICD-10-CM

## 2016-06-18 DIAGNOSIS — Z79899 Other long term (current) drug therapy: Secondary | ICD-10-CM

## 2016-06-18 DIAGNOSIS — Z5112 Encounter for antineoplastic immunotherapy: Secondary | ICD-10-CM

## 2016-06-18 LAB — CBC WITH DIFFERENTIAL/PLATELET
BASO%: 0.8 % (ref 0.0–2.0)
BASOS ABS: 0.1 10*3/uL (ref 0.0–0.1)
EOS ABS: 0.7 10*3/uL — AB (ref 0.0–0.5)
EOS%: 9.2 % — AB (ref 0.0–7.0)
HCT: 35.1 % (ref 34.8–46.6)
HEMOGLOBIN: 11.8 g/dL (ref 11.6–15.9)
LYMPH%: 18.6 % (ref 14.0–49.7)
MCH: 29 pg (ref 25.1–34.0)
MCHC: 33.7 g/dL (ref 31.5–36.0)
MCV: 85.9 fL (ref 79.5–101.0)
MONO#: 0.5 10*3/uL (ref 0.1–0.9)
MONO%: 6.4 % (ref 0.0–14.0)
NEUT#: 5 10*3/uL (ref 1.5–6.5)
NEUT%: 65 % (ref 38.4–76.8)
Platelets: 260 10*3/uL (ref 145–400)
RBC: 4.08 10*6/uL (ref 3.70–5.45)
RDW: 14.2 % (ref 11.2–14.5)
WBC: 7.7 10*3/uL (ref 3.9–10.3)
lymph#: 1.4 10*3/uL (ref 0.9–3.3)

## 2016-06-18 LAB — COMPREHENSIVE METABOLIC PANEL
ALK PHOS: 311 U/L — AB (ref 40–150)
ALT: 31 U/L (ref 0–55)
AST: 45 U/L — AB (ref 5–34)
Albumin: 3.6 g/dL (ref 3.5–5.0)
Anion Gap: 10 mEq/L (ref 3–11)
BUN: 15.1 mg/dL (ref 7.0–26.0)
CHLORIDE: 104 meq/L (ref 98–109)
CO2: 26 meq/L (ref 22–29)
Calcium: 9.4 mg/dL (ref 8.4–10.4)
Creatinine: 1.1 mg/dL (ref 0.6–1.1)
EGFR: 62 mL/min/{1.73_m2} — AB (ref >90)
GLUCOSE: 83 mg/dL (ref 70–140)
POTASSIUM: 4.4 meq/L (ref 3.5–5.1)
SODIUM: 140 meq/L (ref 136–145)
Total Bilirubin: 0.24 mg/dL (ref 0.20–1.20)
Total Protein: 8 g/dL (ref 6.4–8.3)

## 2016-06-18 LAB — TSH: TSH: 0.122 m(IU)/L — ABNORMAL LOW (ref 0.308–3.960)

## 2016-06-18 MED ORDER — SODIUM CHLORIDE 0.9 % IV SOLN
200.0000 mg | Freq: Once | INTRAVENOUS | Status: AC
  Administered 2016-06-18: 200 mg via INTRAVENOUS
  Filled 2016-06-18: qty 8

## 2016-06-18 MED ORDER — SODIUM CHLORIDE 0.9 % IV SOLN
Freq: Once | INTRAVENOUS | Status: AC
  Administered 2016-06-18: 12:00:00 via INTRAVENOUS

## 2016-06-18 NOTE — Telephone Encounter (Signed)
Gave patient AVS and calender per 4/23 - all appts already scheduled per 4/23 los.

## 2016-06-18 NOTE — Progress Notes (Signed)
[place] [phone]   Fax:(336) [phone]  OFFICE PROGRESS NOTE  No PCP Per Patient No address on file  DIAGNOSIS: Stage IV (T1b, N2, M1 B) non-small cell lung cancer, adenocarcinoma with positive PDL 1 expression (99%), presented with right lower lobe lung nodule with metastatic right hilar and subcarinal lymphadenopathy as well as bone metastasis diagnosed in September 2017.  Genomic Alterations Identified? NF1 G34f*9 ARID1B [id number] PBRM1 [id number] TP53 P72f45f Additional Findings? Microsatellite status MS-Stable Tumor Mutation Burden TMB-Intermediate; 7 Muts/Mb Additional Disease-relevant Genes with No Reportable Alterations Identified? EGFR KRAS ALK BRAF MET ERBB2 RET ROS1  PRIOR THERAPY: Palliative radiotherapy to the metastatic bone lesion under the care of Dr. KinaSondra ComeURRENT THERAPY: First-line treatment with immunotherapy with Ketruda (pembrolizumab) 200 mg IV every 3 weeks status post 8 cycles. First cycle was given 12/21/2015.  INTERVAL HISTORY: HeleHelayna Duny4. female returns to the clinic today for follow-up visit. The patient is currently on treatment with Ketruda (pembrolizumab) status post 8 cycles and has been tolerating this treatment well. She denied having any chest pain, shortness of breath, cough or hemoptysis. She had mild dry skin but no significant skin rash or diarrhea. She has no weight loss or night sweats. She has no nausea, vomiting, diarrhea or constipation. She is here today for evaluation before starting cycle #9.  MEDICAL HISTORY: Past Medical History:  Diagnosis Date  . Adenocarcinoma of right lung, stage 4 (HCC)[place]/05/2015  . Complication of anesthesia    hard to wake up  . COPD (chronic obstructive pulmonary disease) (HCC)[place]. Eczema   . Encounter for antineoplastic chemotherapy 11/30/2015  . Heart murmur    told at age 46 b12 never has had any problems  . History of radiation therapy  01/26/16-02/07/16   right pelvis 30 Gy in 10 fractions  . HTN (hypertension) 02/01/2016  . 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 Lung mass   . Pneumonia 09/2015    ALLERGIES:  is allergic to penicillins.  MEDICATIONS:  Current Outpatient Prescriptions  Medication Sig Dispense Refill  . aspirin 325 MG tablet Take 325 mg by mouth daily.    . cetirizine (ZYRTEC) 10 MG tablet Take 1 tablet (10 mg total) by mouth daily. 30 tablet 2  . hydrocortisone cream 0.5 % Apply 1 application topically 2 (two) times daily. 30 g 2  . levothyroxine (SYNTHROID, LEVOTHROID) 100 MCG tablet Take 1 tablet (100 mcg total) by mouth daily before breakfast. 30 tablet 1  . Multiple Vitamin (MULTIVITAMIN) tablet Take 1 tablet by mouth daily.    . prochlorperazine (COMPAZINE) 10 MG tablet Take 1 tablet (10 mg total) by mouth every 6 (six) hours as needed for nausea or vomiting. (Patient not taking: Reported on 05/07/2016) 30 tablet 0   No current facility-administered medications for this visit.     SURGICAL HISTORY:  Past Surgical History:  Procedure Laterality Date  . ABDOMINAL HYSTERECTOMY    . OOPHORECTOMY  1996   tumor removed from right ovary  . TONSILLECTOMY    . VIDEO BRONCHOSCOPY WITH ENDOBRONCHIAL NAVIGATION N/A 11/18/2015   Procedure: VIDEO BRONCHOSCOPY WITH ENDOBRONCHIAL NAVIGATION;  Surgeon: StevMelrose Nakayama;  [facility];  Laterality: N/A;  . VIDEO BRONCHOSCOPY WITH ENDOBRONCHIAL ULTRASOUND N/A 11/18/2015   Procedure: VIDEO BRONCHOSCOPY WITH ENDOBRONCHIAL ULTRASOUND;  Surgeon: StevMelrose Nakayama;  [facility];  Laterality: N/A;    REVIEW OF SYSTEMS:  A comprehensive  review of systems was negative.   PHYSICAL EXAMINATION: General appearance: alert, cooperative and no distress Head: Normocephalic, without obvious abnormality, atraumatic Neck: no adenopathy, no JVD, supple, symmetrical, trachea midline and thyroid not enlarged,  symmetric, no tenderness/mass/nodules Lymph nodes: Cervical, supraclavicular, and axillary nodes normal. Resp: clear to auscultation bilaterally Back: symmetric, no curvature. ROM normal. No CVA tenderness. Cardio: normal apical impulse GI: soft, non-tender; bowel sounds normal; no masses,  no organomegaly Extremities: extremities normal, atraumatic, no cyanosis or edema  ECOG PERFORMANCE STATUS: 0 - Asymptomatic  Blood pressure (!) 155/49, pulse 81, temperature 98.2 F (36.8 C), temperature source Oral, resp. rate 17, height 5' 3"  (1.6 m), weight 111 lb 11.2 oz (50.7 kg), SpO2 100 %.  LABORATORY DATA: Lab Results  Component Value Date   WBC 7.7 06/18/2016   HGB 11.8 06/18/2016   HCT 35.1 06/18/2016   MCV 85.9 06/18/2016   PLT 260 06/18/2016      Chemistry      Component Value Date/Time   NA 140 06/18/2016 1112   K 4.4 06/18/2016 1112   CL 103 11/15/2015 1512   CO2 26 06/18/2016 1112   BUN 15.1 06/18/2016 1112   CREATININE 1.1 06/18/2016 1112      Component Value Date/Time   CALCIUM 9.4 06/18/2016 1112   ALKPHOS 311 (H) 06/18/2016 1112   AST 45 (H) 06/18/2016 1112   ALT 31 06/18/2016 1112   BILITOT 0.24 06/18/2016 1112       RADIOGRAPHIC STUDIES: No results found.  ASSESSMENT AND PLAN:  This is a very pleasant 66 years old African-American female with metastatic non-small cell lung cancer, adenocarcinoma and positive PDL 1 expression of 99% The patient is currently undergoing treatment with immunotherapy with [place] status post 8 cycles and has been tolerating this treatment well. I recommended for the patient to proceed with cycle #9 today as scheduled. I will see her back for follow-up visit in 3 weeks for evaluation after repeating CT scan of the chest, abdomen and pelvis for restaging of her disease. She was advised to call immediately if she has any concerning symptoms in the interval. The patient voices understanding of current disease status and treatment  options and is in agreement with the current care plan. All questions were answered. The patient knows to call the clinic with any problems, questions or concerns. We can certainly see the patient much sooner if necessary. I spent 10 minutes counseling the patient face to face. The total time spent in the appointment was 15 minutes.  Disclaimer: This note was dictated with voice recognition software. Similar sounding words can inadvertently be transcribed and may not be corrected upon review.

## 2016-06-18 NOTE — Patient Instructions (Signed)
[facility] Discharge Instructions for Patients Receiving Chemotherapy  Today you received the following chemotherapy agents Keytruda  To help prevent nausea and vomiting after your treatment, we encourage you to take your nausea medication    If you develop nausea and vomiting that is not controlled by your nausea medication, call the clinic.   BELOW ARE SYMPTOMS THAT SHOULD BE REPORTED IMMEDIATELY:  *FEVER GREATER THAN 100.5 F  *CHILLS WITH OR WITHOUT FEVER  NAUSEA AND VOMITING THAT IS NOT CONTROLLED WITH YOUR NAUSEA MEDICATION  *UNUSUAL SHORTNESS OF BREATH  *UNUSUAL BRUISING OR BLEEDING  TENDERNESS IN MOUTH AND THROAT WITH OR WITHOUT PRESENCE OF ULCERS  *URINARY PROBLEMS  *BOWEL PROBLEMS  UNUSUAL RASH Items with * indicate a potential emergency and should be followed up as soon as possible.  Feel free to call the clinic you have any questions or concerns. The clinic phone number is [phone].  Please show the CHEMO ALERT CARD at check-in to the Emergency Department and triage nurse.   

## 2016-07-09 ENCOUNTER — Telehealth: Payer: Self-pay | Admitting: Internal Medicine

## 2016-07-09 ENCOUNTER — Encounter: Payer: Self-pay | Admitting: Internal Medicine

## 2016-07-09 ENCOUNTER — Other Ambulatory Visit (HOSPITAL_BASED_OUTPATIENT_CLINIC_OR_DEPARTMENT_OTHER): Payer: Medicare Other

## 2016-07-09 ENCOUNTER — Ambulatory Visit (HOSPITAL_BASED_OUTPATIENT_CLINIC_OR_DEPARTMENT_OTHER): Payer: Medicare Other

## 2016-07-09 ENCOUNTER — Ambulatory Visit (HOSPITAL_BASED_OUTPATIENT_CLINIC_OR_DEPARTMENT_OTHER): Payer: Medicare Other | Admitting: Internal Medicine

## 2016-07-09 VITALS — BP 120/54 | HR 74 | Temp 98.5°F | Resp 18 | Ht 63.0 in | Wt 111.0 lb

## 2016-07-09 DIAGNOSIS — C3431 Malignant neoplasm of lower lobe, right bronchus or lung: Secondary | ICD-10-CM

## 2016-07-09 DIAGNOSIS — C7951 Secondary malignant neoplasm of bone: Secondary | ICD-10-CM | POA: Diagnosis not present

## 2016-07-09 DIAGNOSIS — Z5112 Encounter for antineoplastic immunotherapy: Secondary | ICD-10-CM

## 2016-07-09 DIAGNOSIS — C3491 Malignant neoplasm of unspecified part of right bronchus or lung: Secondary | ICD-10-CM

## 2016-07-09 DIAGNOSIS — E039 Hypothyroidism, unspecified: Secondary | ICD-10-CM

## 2016-07-09 LAB — CBC WITH DIFFERENTIAL/PLATELET
BASO%: 0.7 % (ref 0.0–2.0)
BASOS ABS: 0.1 10*3/uL (ref 0.0–0.1)
EOS ABS: 0.8 10*3/uL — AB (ref 0.0–0.5)
EOS%: 10 % — AB (ref 0.0–7.0)
HEMATOCRIT: 32.8 % — AB (ref 34.8–46.6)
HEMOGLOBIN: 11 g/dL — AB (ref 11.6–15.9)
LYMPH%: 22 % (ref 14.0–49.7)
MCH: 28.7 pg (ref 25.1–34.0)
MCHC: 33.4 g/dL (ref 31.5–36.0)
MCV: 85.9 fL (ref 79.5–101.0)
MONO#: 0.6 10*3/uL (ref 0.1–0.9)
MONO%: 7.1 % (ref 0.0–14.0)
NEUT#: 4.8 10*3/uL (ref 1.5–6.5)
NEUT%: 60.2 % (ref 38.4–76.8)
Platelets: 275 10*3/uL (ref 145–400)
RBC: 3.82 10*6/uL (ref 3.70–5.45)
RDW: 14.2 % (ref 11.2–14.5)
WBC: 7.9 10*3/uL (ref 3.9–10.3)
lymph#: 1.7 10*3/uL (ref 0.9–3.3)

## 2016-07-09 LAB — COMPREHENSIVE METABOLIC PANEL
ALT: 21 U/L (ref 0–55)
AST: 39 U/L — AB (ref 5–34)
Albumin: 3.6 g/dL (ref 3.5–5.0)
Alkaline Phosphatase: 247 U/L — ABNORMAL HIGH (ref 40–150)
Anion Gap: 9 mEq/L (ref 3–11)
BUN: 12.2 mg/dL (ref 7.0–26.0)
CALCIUM: 9.2 mg/dL (ref 8.4–10.4)
CHLORIDE: 106 meq/L (ref 98–109)
CO2: 24 mEq/L (ref 22–29)
CREATININE: 1.1 mg/dL (ref 0.6–1.1)
EGFR: 62 mL/min/{1.73_m2} — ABNORMAL LOW (ref >90)
GLUCOSE: 73 mg/dL (ref 70–140)
POTASSIUM: 4.2 meq/L (ref 3.5–5.1)
SODIUM: 138 meq/L (ref 136–145)
Total Bilirubin: 0.38 mg/dL (ref 0.20–1.20)
Total Protein: 7.9 g/dL (ref 6.4–8.3)

## 2016-07-09 MED ORDER — SODIUM CHLORIDE 0.9 % IV SOLN
Freq: Once | INTRAVENOUS | Status: AC
  Administered 2016-07-09: 13:00:00 via INTRAVENOUS

## 2016-07-09 MED ORDER — SODIUM CHLORIDE 0.9 % IV SOLN
200.0000 mg | Freq: Once | INTRAVENOUS | Status: AC
  Administered 2016-07-09: 200 mg via INTRAVENOUS
  Filled 2016-07-09: qty 8

## 2016-07-09 NOTE — Progress Notes (Signed)
[place] [phone]   [phone]  OFFICE PROGRESS NOTE  Patient, No Pcp Per No address on file  DIAGNOSIS: Stage IV (T1b, N2, M1 B) non-small cell lung cancer, adenocarcinoma with positive PDL 1 expression (99%), presented with right lower lobe lung nodule with metastatic right hilar and subcarinal lymphadenopathy as well as bone metastasis diagnosed in September 2017.  Genomic Alterations Identified? NF1 G39f*9 ARID1B [id number] PBRM1 [id number] TP53 P72f55f Additional Findings? Microsatellite status MS-Stable Tumor Mutation Burden TMB-Intermediate; 7 Muts/Mb Additional Disease-relevant Genes with No Reportable Alterations Identified? EGFR KRAS ALK BRAF MET ERBB2 RET ROS1  PRIOR THERAPY: Palliative radiotherapy to the metastatic bone lesion under the care of Dr. KinaSondra ComeURRENT THERAPY: First-line treatment with immunotherapy with Ketruda (pembrolizumab) 200 mg IV every 3 weeks status post 9 cycles. First cycle was given 12/21/2015.  INTERVAL HISTORY: Brittany Mercado. female returns to the clinic today for follow-up visit accompanied by her sister. The patient related the last cycle of her treatment with KetrHungaryrly well. She denied having any skin rash or diarrhea. She denied having any nausea, vomiting or constipation. She has no chest pain, shortness of breath, cough or hemoptysis. She denied having any weight loss or night sweats. She was supposed to have repeat CT scan of the chest, abdomen and pelvis before this visit but the patient missed her appointment.  MEDICAL HISTORY: Past Medical History:  Diagnosis Date  . Adenocarcinoma of right lung, stage 4 (HCC)[place]/05/2015  . Complication of anesthesia    hard to wake up  . COPD (chronic obstructive pulmonary disease) (HCC)[place]. Eczema   . Encounter for antineoplastic chemotherapy 11/30/2015  . Heart murmur    told at age 7 b64 never has had any problems  . History of  radiation therapy 01/26/16-02/07/16   right pelvis 30 Gy in 10 fractions  . HTN (hypertension) 02/01/2016  . 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 Lung mass   . Pneumonia 09/2015    ALLERGIES:  is allergic to penicillins.  MEDICATIONS:  Current Outpatient Prescriptions  Medication Sig Dispense Refill  . aspirin 325 MG tablet Take 325 mg by mouth daily.    . cetirizine (ZYRTEC) 10 MG tablet Take 1 tablet (10 mg total) by mouth daily. 30 tablet 2  . hydrocortisone cream 0.5 % Apply 1 application topically 2 (two) times daily. 30 g 2  . levothyroxine (SYNTHROID, LEVOTHROID) 100 MCG tablet Take 1 tablet (100 mcg total) by mouth daily before breakfast. 30 tablet 1  . Multiple Vitamin (MULTIVITAMIN) tablet Take 1 tablet by mouth daily.    . prochlorperazine (COMPAZINE) 10 MG tablet Take 1 tablet (10 mg total) by mouth every 6 (six) hours as needed for nausea or vomiting. (Patient not taking: Reported on 05/07/2016) 30 tablet 0   No current facility-administered medications for this visit.     SURGICAL HISTORY:  Past Surgical History:  Procedure Laterality Date  . ABDOMINAL HYSTERECTOMY    . OOPHORECTOMY  1996   tumor removed from right ovary  . TONSILLECTOMY    . VIDEO BRONCHOSCOPY WITH ENDOBRONCHIAL NAVIGATION N/A 11/18/2015   Procedure: VIDEO BRONCHOSCOPY WITH ENDOBRONCHIAL NAVIGATION;  Surgeon: StevMelrose Nakayama;  [facility];  Laterality: N/A;  . VIDEO BRONCHOSCOPY WITH ENDOBRONCHIAL ULTRASOUND N/A 11/18/2015   Procedure: VIDEO BRONCHOSCOPY WITH ENDOBRONCHIAL ULTRASOUND;  Surgeon: StevMelrose Nakayama;  [facility];  Laterality: N/A;  REVIEW OF SYSTEMS:  A comprehensive review of systems was negative.   PHYSICAL EXAMINATION: General appearance: alert, cooperative and no distress Head: Normocephalic, without obvious abnormality, atraumatic Neck: no adenopathy, no JVD, supple, symmetrical, trachea midline and thyroid not  enlarged, symmetric, no tenderness/mass/nodules Lymph nodes: Cervical, supraclavicular, and axillary nodes normal. Resp: clear to auscultation bilaterally Back: symmetric, no curvature. ROM normal. No CVA tenderness. Cardio: normal apical impulse GI: soft, non-tender; bowel sounds normal; no masses,  no organomegaly Extremities: extremities normal, atraumatic, no cyanosis or edema  ECOG PERFORMANCE STATUS: 0 - Asymptomatic  Blood pressure (!) 120/54, pulse 74, temperature 98.5 F (36.9 C), temperature source Oral, resp. rate 18, height 5' 3"  (1.6 m), weight 111 lb (50.3 kg), SpO2 100 %.  LABORATORY DATA: Lab Results  Component Value Date   WBC 7.9 07/09/2016   HGB 11.0 (L) 07/09/2016   HCT 32.8 (L) 07/09/2016   MCV 85.9 07/09/2016   PLT 275 07/09/2016      Chemistry      Component Value Date/Time   NA 140 06/18/2016 1112   K 4.4 06/18/2016 1112   CL 103 11/15/2015 1512   CO2 26 06/18/2016 1112   BUN 15.1 06/18/2016 1112   CREATININE 1.1 06/18/2016 1112      Component Value Date/Time   CALCIUM 9.4 06/18/2016 1112   ALKPHOS 311 (H) 06/18/2016 1112   AST 45 (H) 06/18/2016 1112   ALT 31 06/18/2016 1112   BILITOT 0.24 06/18/2016 1112       RADIOGRAPHIC STUDIES: No results found.  ASSESSMENT AND PLAN:  This is a very pleasant 66 years old African-American female with metastatic non-small cell lung cancer, adenocarcinoma and positive PDL 1 expression of 99%. The patient is currently undergoing treatment with Ketruda (pembrolizumab) status post 9 cycles. The patient is tolerating her treatment well with no significant adverse effects. I recommended for her to proceed with cycle #10 today as a scheduled. I will reschedule her CT scan of the chest, abdomen and pelvis to be performed before her next cycle in 3 weeks. She was advised to call immediately if she has any concerning symptoms in the interval. The patient voices understanding of current disease status and treatment  options and is in agreement with the current care plan. All questions were answered. The patient knows to call the clinic with any problems, questions or concerns. We can certainly see the patient much sooner if necessary. I spent 10 minutes counseling the patient face to face. The total time spent in the appointment was 15 minutes.  Disclaimer: This note was dictated with voice recognition software. Similar sounding words can inadvertently be transcribed and may not be corrected upon review.

## 2016-07-09 NOTE — Patient Instructions (Signed)
[facility] Discharge Instructions for Patients Receiving Chemotherapy  Today you received the following chemotherapy agents Keytruda  To help prevent nausea and vomiting after your treatment, we encourage you to take your nausea medication    If you develop nausea and vomiting that is not controlled by your nausea medication, call the clinic.   BELOW ARE SYMPTOMS THAT SHOULD BE REPORTED IMMEDIATELY:  *FEVER GREATER THAN 100.5 F  *CHILLS WITH OR WITHOUT FEVER  NAUSEA AND VOMITING THAT IS NOT CONTROLLED WITH YOUR NAUSEA MEDICATION  *UNUSUAL SHORTNESS OF BREATH  *UNUSUAL BRUISING OR BLEEDING  TENDERNESS IN MOUTH AND THROAT WITH OR WITHOUT PRESENCE OF ULCERS  *URINARY PROBLEMS  *BOWEL PROBLEMS  UNUSUAL RASH Items with * indicate a potential emergency and should be followed up as soon as possible.  Feel free to call the clinic you have any questions or concerns. The clinic phone number is [phone].  Please show the CHEMO ALERT CARD at check-in to the Emergency Department and triage nurse.   

## 2016-07-09 NOTE — Telephone Encounter (Signed)
2 bottles of Contrast given to patient with a copy of the instructions, per CT ordered. Appointments scheduled per 07/09/16 los. Patient was given a copy of the AVS report and appointment schedule per 07/09/16 los.

## 2016-07-22 ENCOUNTER — Emergency Department: Payer: Medicare Other

## 2016-07-22 ENCOUNTER — Emergency Department
Admission: EM | Admit: 2016-07-22 | Discharge: 2016-07-22 | Disposition: A | Discharge status: Home / Self Care | Payer: Medicare Other | Attending: Emergency Medicine | Admitting: Emergency Medicine

## 2016-07-22 DIAGNOSIS — Z859 Personal history of malignant neoplasm, unspecified: Secondary | ICD-10-CM | POA: Insufficient documentation

## 2016-07-22 DIAGNOSIS — R079 Chest pain, unspecified: Secondary | ICD-10-CM | POA: Insufficient documentation

## 2016-07-22 DIAGNOSIS — M79661 Pain in right lower leg: Secondary | ICD-10-CM | POA: Insufficient documentation

## 2016-07-22 DIAGNOSIS — M79662 Pain in left lower leg: Secondary | ICD-10-CM | POA: Insufficient documentation

## 2016-07-22 DIAGNOSIS — Z7982 Long term (current) use of aspirin: Secondary | ICD-10-CM | POA: Insufficient documentation

## 2016-07-22 DIAGNOSIS — I10 Essential (primary) hypertension: Secondary | ICD-10-CM | POA: Insufficient documentation

## 2016-07-22 LAB — CBC AND DIFFERENTIAL
Absolute NRBC: 0 10*3/uL
Basophils Absolute Automated: 0.05 10*3/uL (ref 0.00–0.20)
Basophils Automated: 0.5 %
Eosinophils Absolute Automated: 0.61 10*3/uL (ref 0.00–0.70)
Eosinophils Automated: 6.6 %
Hematocrit: 31.7 % — ABNORMAL LOW (ref 37.0–47.0)
Hgb: 11.1 g/dL — ABNORMAL LOW (ref 12.0–16.0)
Immature Granulocytes Absolute: 0.03 10*3/uL
Immature Granulocytes: 0.3 %
Lymphocytes Absolute Automated: 1.87 10*3/uL (ref 0.50–4.40)
Lymphocytes Automated: 20.2 %
MCH: 28.8 pg (ref 28.0–32.0)
MCHC: 35 g/dL (ref 32.0–36.0)
MCV: 82.1 fL (ref 80.0–100.0)
MPV: 10.6 fL (ref 9.4–12.3)
Monocytes Absolute Automated: 0.78 10*3/uL (ref 0.00–1.20)
Monocytes: 8.4 %
Neutrophils Absolute: 5.94 10*3/uL (ref 1.80–8.10)
Neutrophils: 64 %
Nucleated RBC: 0 /100 WBC (ref 0.0–1.0)
Platelets: 274 10*3/uL (ref 140–400)
RBC: 3.86 10*6/uL — ABNORMAL LOW (ref 4.20–5.40)
RDW: 13 % (ref 12–15)
WBC: 9.28 10*3/uL (ref 3.50–10.80)

## 2016-07-22 LAB — COMPREHENSIVE METABOLIC PANEL
ALT: 22 U/L (ref 0–55)
AST (SGOT): 44 U/L — ABNORMAL HIGH (ref 5–34)
Albumin/Globulin Ratio: 0.9 (ref 0.9–2.2)
Albumin: 3.8 g/dL (ref 3.5–5.0)
Alkaline Phosphatase: 319 U/L — ABNORMAL HIGH (ref 37–106)
Anion Gap: 8 (ref 5.0–15.0)
BUN: 16 mg/dL (ref 7.0–19.0)
Bilirubin, Total: 0.4 mg/dL (ref 0.2–1.2)
CO2: 26 mEq/L (ref 22–29)
Calcium: 9.6 mg/dL (ref 8.5–10.5)
Chloride: 102 mEq/L (ref 100–111)
Creatinine: 1.1 mg/dL — ABNORMAL HIGH (ref 0.6–1.0)
Globulin: 4.4 g/dL — ABNORMAL HIGH (ref 2.0–3.6)
Glucose: 77 mg/dL (ref 70–100)
Potassium: 4.6 mEq/L (ref 3.5–5.1)
Protein, Total: 8.2 g/dL (ref 6.0–8.3)
Sodium: 136 mEq/L (ref 136–145)

## 2016-07-22 LAB — TROPONIN I: Troponin I: <0.01 ng/mL (ref 0.00–0.09)

## 2016-07-22 LAB — GFR: EGFR: >60

## 2016-07-22 LAB — HEMOLYSIS INDEX: Hemolysis Index: 2 (ref 0–18)

## 2016-07-22 MED ORDER — SODIUM CHLORIDE 0.9 % IV BOLUS
1000.0000 mL | Freq: Once | INTRAVENOUS | Status: AC
Start: 2016-07-22 — End: 2016-07-22
  Administered 2016-07-22: 13:00:00 1000 mL via INTRAVENOUS

## 2016-07-22 MED ORDER — IOHEXOL 350 MG/ML IV SOLN
100.0000 mL | Freq: Once | INTRAVENOUS | Status: AC | PRN
Start: 2016-07-22 — End: 2016-07-22
  Administered 2016-07-22: 14:00:00 100 mL via INTRAVENOUS

## 2016-07-22 NOTE — ED Notes (Signed)
Bed: BL16  Expected date:   Expected time:   Means of arrival:   Comments:  Medic 202

## 2016-07-22 NOTE — ED Provider Notes (Signed)
EMERGENCY DEPARTMENT HISTORY AND PHYSICAL EXAM    Date: 07/22/2016  Patient Name: Maria Wolf  Attending Physician:  Lavonda Jumbo, MD, FACEP  Diagnosis and Treatment Plan       Clinical Impression:   Final diagnoses:   Chest pain in adult   Bilateral calf pain       Treatment Plan:   ED Disposition     ED Disposition Condition Date/Time Comment    Discharge  Sun Jul 22, 2016  3:08 PM Maria Wolf discharge to home/self care.    Condition at disposition: Stable          History of Presenting Illness     Chief Complaint   Patient presents with   . Chest Pain       History Provided By: Patient  Chief Complaint: CP  Onset: 5 min   Timing: PTA  Location: Right sided   Quality: Aching   Severity: Moderate   Modifying Factors: None   Associated sxs: Bilateral leg pain     Additional History: Amamda Curbow is a 66 y.o. female with a PMHx of Stage 4 lung CA since last year presenting to the ED with constant right sided CP PTA lasting more than 5 min. The patient is currently doing radiation. The patient c/o of bilateral leg pain and the patient's family states that while they were outside she "just stopped walking." The pt denies N/V/D, SOB, or any further pain.         PCP: Pcp, Noneorunknown, MD      No current facility-administered medications for this encounter.     Current Outpatient Prescriptions:   .  aspirin 81 MG chewable tablet, Chew 81 mg by mouth daily., Disp: , Rfl:   .  ferrous sulfate 325 (65 FE) MG tablet, Take 325 mg by mouth every morning with breakfast., Disp: , Rfl:   .  levothyroxine (SYNTHROID, LEVOTHROID) 100 MCG tablet, Take 100 mcg by mouth Once a day at 6:00am., Disp: , Rfl:     Past Medical History     Past Medical History:   Diagnosis Date   . Hypertension    . Malignant neoplasm      History reviewed. No pertinent surgical history.    Family History     History reviewed. No pertinent family history.    Social History     Social History     Social History   . Marital status: Single     Spouse name:  N/A   . Number of children: N/A   . Years of education: N/A     Social History Main Topics   . Smoking status: Never Smoker   . Smokeless tobacco: Never Used   . Alcohol use No   . Drug use: No   . Sexual activity: Not on file     Other Topics Concern   . Not on file     Social History Narrative   . No narrative on file       Allergies     No Known Allergies    Review of Systems     Review of Systems   Cardiovascular: Positive for chest pain.   Gastrointestinal: Negative for diarrhea, nausea and vomiting.   All other systems reviewed and are negative.      Physical Exam     BP 156/69   Pulse 80   Temp 98.2 F (36.8 C) (Oral)   Resp 18   Wt 50 kg   SpO2  97%   Pulse Oximetry Analysis - Normal              Physical Examination: General appearance - alert, well appearing, and in no distress  Mental status - alert, oriented to person, place, and time  Eyes - pupils equal and reactive, extraocular eye movements intact  Nose - normal and patent, no discharge  Neck - grossly normal rom  Chest - clear to auscultation, no wheezes, rales or rhonchi, symmetric air entry  Heart - normal rate, regular rhythm  Abdomen - soft, nontender, nondistended  Neurological - maex4, speech clear  Musculoskeletal - no joint tenderness, deformity or swelling  Extremities - distal blood flow grossly normal, no pedal edema  Skin - normal coloration, no rashes where visualized   Psychiatric- alert, oriented, anxious      Pt still smoking 2 packs daily at least.          EKG:  Interpreted by EDP. Sinus rhythm. Rate is normal. Nonspecific ST/Twave changes.  Monitor: Normal sinus rhythm.            Diagnostic Study Results     Labs -     Results     Procedure Component Value Units Date/Time    Troponin I [[id number]] Collected:  07/22/16 1309    Specimen:  Blood Updated:  07/22/16 1351     Troponin I <0.01 ng/mL     Comprehensive metabolic panel [[id number]]  (Abnormal) Collected:  07/22/16 1309    Specimen:  Blood Updated:  07/22/16 1344      Glucose 77 mg/dL      BUN 78.2 mg/dL      Creatinine 1.1 (H) mg/dL      Sodium 956 mEq/L      Potassium 4.6 mEq/L      Chloride 102 mEq/L      CO2 26 mEq/L      Calcium 9.6 mg/dL      Protein, Total 8.2 g/dL      Albumin 3.8 g/dL      AST (SGOT) 44 (H) U/L      ALT 22 U/L      Alkaline Phosphatase 319 (H) U/L      Bilirubin, Total 0.4 mg/dL      Globulin 4.4 (H) g/dL      Albumin/Globulin Ratio 0.9     Anion Gap 8.0    Hemolysis index [[id number]] Collected:  07/22/16 1309     Updated:  07/22/16 1344     Hemolysis Index 2    GFR [[id number]] Collected:  07/22/16 1309     Updated:  07/22/16 1344     EGFR >60.0    CBC and differential [[id number]]  (Abnormal) Collected:  07/22/16 1309    Specimen:  Blood from Blood Updated:  07/22/16 1330     WBC 9.28 x10 3/uL      Hgb 11.1 (L) g/dL      Hematocrit 27.2 (L) %      Platelets 274 x10 3/uL      RBC 3.86 (L) x10 6/uL      MCV 82.1 fL      MCH 28.8 pg      MCHC 35.0 g/dL      RDW 13 %      MPV 10.6 fL      Neutrophils 64.0 %      Lymphocytes Automated 20.2 %      Monocytes 8.4 %      Eosinophils Automated 6.6 %  Basophils Automated 0.5 %      Immature Granulocyte 0.3 %      Nucleated RBC 0.0 /100 WBC      Neutrophils Absolute 5.94 x10 3/uL      Abs Lymph Automated 1.87 x10 3/uL      Abs Mono Automated 0.78 x10 3/uL      Abs Eos Automated 0.61 x10 3/uL      Absolute Baso Automated 0.05 x10 3/uL      Absolute Immature Granulocyte 0.03 x10 3/uL      Absolute NRBC 0.00 x10 3/uL           Radiologic Studies -   Radiology Results (24 Hour)     Procedure Component Value Units Date/Time    US Venous Low Extrem Duplx Dopp Comp Bilat [[id number]] Resulted:  07/22/16 1437    Order Status:  Sent Updated:  07/22/16 1437    CT Angiogram Chest [[id number]] Collected:  07/22/16 1411    Order Status:  Completed Updated:  07/22/16 1422    Narrative:       CTA CHEST    CLINICAL STATEMENT: Acute diffuse chest pain and shortness of breath.      COMPARISON: No prior studies are available for  comparison.    TECHNIQUE: Multiple helical thin-sections were obtained from the lung  apices to the upper abdomen following rapid bolus administration of 100  cc of Omnipaque 350 intravenous contrast. Coronal and sagittal  reformatted 3-D maximum intensity pixel reconstructions were then  obtained.    Note that CT scanning at this site utilizes multiple dose reduction  techniques including automatic exposure control, adjustment of the MAS  and/or KVP according to patient size, and use of iterative  reconstruction technique.    FINDINGS:     Pulmonary Arteries: No interarterial filling defect is present to  suggest a pulmonary embolism.     Aorta: There is no abdominal aortic aneurysm. Mild lateral scout  calcification is noted of the abdominal aorta. Mild atherosclerotic  calcification is noted of the coronary arteries.     Lymph Nodes: There is no mediastinal, hilar or axillary lymphadenopathy.      Pleura/Pleural Fluid: No pleural or pericardial effusions are  identified.     Lung Parenchyma: A low-attenuation pleural-based 2.2 x 1.3 cm nodular  area of consolidation is noted along the posterior aspect of the  superior segment of the right lower lobe. There is moderate  centrilobular emphysema most severely affecting the upper lungs. Streaky  consolidation is noted posteriorly of the right lower lobe.      Miscellaneous: The trachea and central airways appear patent.     Upper Abdomen: No significant abnormalities can be visualized within the  upper abdomen.    Musculoskeletal: The osseous structures are intact.      Impression:         1. No evidence of pulmonary embolism.  2. Nodular a low-attenuation pleural-based consolidation of the right  lower lobe. Consider follow-up CT at 3 months, and/or CT-PET, and/or  biopsy.  3. Right hilar lymphadenopathy.  4. Moderate centrilobular emphysema.    Fonnie Mu, MD   07/22/2016 2:18 PM    XR Chest AP Portable [[id number]] Collected:  07/22/16 1343    Order Status:   Completed Updated:  07/22/16 1347    Narrative:       PORTABLE CHEST    CLINICAL STATEMENT: diffuse chest pain    COMPARISON: No prior studies are available for comparison.  FINDINGS: The lungs are clear. There is mild biapical pleural  thickening. There is no pneumothorax. The cardiomediastinal silhouette  is unremarkable.          Impression:         No acute cardiopulmonary disease.    Fonnie Mu, MD   07/22/2016 1:43 PM      .    Doctor's Notes     1440. US Doppler negative DVT bilaterally.     1508. Offered admission, but the patient refuses because she states she is on vacation. She has been strongly advised about the risks, but the family states that they can manage her comfortably.         _______________________________  Medical DeMedical Decision Makingcision Making  Attestations:     Physician/Midlevel provider first contact with patient: 07/22/16 1256         This note is prepared for Lavonda Jumbo, MD, FACEP. The scribe's documentation has been prepared under my direction and personally reviewed by me in its entirety.  I confirm that the note above accurately reflects all work, treatment, procedures, and medical decision making performed by me.     I am the first provider for this patient.      Lavonda Jumbo, MD, FACEP is the primary emergency doctor of record.      I reviewed the available vital signs, nursing notes, past medical history, past surgical history, family history and social history at the time of initial evaluation.    _______________________________           Donny Pique, MD  07/22/16 [phone]

## 2016-07-22 NOTE — ED Triage Notes (Signed)
PT biba with c/o stabbing chest pain that radiates across her chest. Pt states her pain is 8/10 and movement precipitates the pain.Pt admits to hx of Lung cancer. Pt is alert and oriented .

## 2016-07-22 NOTE — Discharge Instructions (Signed)
Chest Pain of Unclear Etiology     You have been seen for chest pain. The cause of your pain is not yet known.     Your doctor has learned about your medical history, examined you, and checked any tests that were done. Still, it is unclear why you are having pain. The doctor thinks there is only a very small chance that your pain is caused by a life-threatening condition. Later, your primary care doctor might do more tests or check you again.     Sometimes chest pain is caused by a dangerous condition, like a heart attack, aorta injury, blood clot in the lung, or collapsed lung. It is unlikely that your pain is caused by a life-threatening condition if: Your chest pain lasts only a few seconds at a time; you are not short of breath, nauseated (sick to your stomach), sweaty, or lightheaded; your pain gets worse when you twist or bend; your pain improves with exercise or hard work.     Chest pain is serious. It is VERY IMPORTANT that you follow up with your regular doctor and seek medical attention immediately here or at the nearest Emergency Department if your symptoms become worse or they change.     YOU SHOULD SEEK MEDICAL ATTENTION IMMEDIATELY, EITHER HERE OR AT THE NEAREST EMERGENCY DEPARTMENT, IF ANY OF THE FOLLOWING OCCURS:  · Your pain gets worse.  · Your pain makes you short of breath, nauseated, or sweaty.  · Your pain gets worse when you walk, go up stairs, or exert yourself.  · You feel weak, lightheaded, or faint.  · It hurts to breathe.  · Your leg swells.  · Your symptoms get worse or you have new symptoms or concerns.              Leg Pain     You have been diagnosed with leg pain.     Many things can cause leg pain. Most of the causes are not dangerous and will get better on their own. These can include muscle cramps, bruises, strains, pinched nerves, and minor skin infections.     You had an exam by your doctor today. He or she decided that the cause of your leg pain does not seem to be serious or  dangerous.     During your visit, you had an ultrasound (duplex doppler) to make sure that you didn t have a blood clot in your leg.     If your pain continues, you might need another exam or more tests. This is to find out why you have leg pain. The cause of your symptoms doesn t seem dangerous now. You don t need to stay in the hospital.     Though we don’t believe your condition is dangerous right now, it is important to be careful. Sometimes a problem that seems mild can become serious later. This is why it is very important that you return here or go to the nearest Emergency Department if you are not improving or your symptoms are getting worse.     Some things you may try at home are:   · Over-the-counter pain medications like that have ibuprofen (Advil®/Motrin®) or acetaminophen (Tylenol®) in them. Follow the directions on the package.  · Rest the leg as needed. As soon as you are able, start moving your leg again to keep it from getting stiff.     Return here or go to the nearest Emergency Department or follow-up with your doctor if you are not getting better as expected.     Follow the   instructions for any medication you are prescribed.      YOU SHOULD SEEK MEDICAL ATTENTION IMMEDIATELY, EITHER HERE OR AT THE NEAREST EMERGENCY DEPARTMENT, IF ANY OF THE FOLLOWING HAPPENS:  · You have a fever (temperature higher than 100.4°F or 38°C).  · Your pain does not go away or gets worse.  · Your leg or joints (hip, knee, ankle, etc.) are red or swollen.  · Your chest hurts or you get short of breath.  · You have any other symptoms or concerns, or don t get better as expected.     If you can t follow up with your doctor, or if at any time you feel you need to be rechecked or seen again, come back here or go to the nearest emergency department.

## 2016-07-23 LAB — ECG 12-LEAD
Atrial Rate: 77 {beats}/min
P Axis: 84 degrees
P-R Interval: 148 ms
Q-T Interval: 356 ms
QRS Duration: 66 ms
QTC Calculation (Bezet): 402 ms
R Axis: 27 degrees
T Axis: 134 degrees
Ventricular Rate: 77 {beats}/min

## 2016-07-27 ENCOUNTER — Ambulatory Visit (HOSPITAL_COMMUNITY)
Admission: RE | Admit: 2016-07-27 | Discharge: 2016-07-27 | Disposition: A | Discharge status: Home / Self Care | Payer: Medicare Other | Source: Ambulatory Visit | Attending: Internal Medicine | Admitting: Internal Medicine

## 2016-07-27 ENCOUNTER — Other Ambulatory Visit: Payer: Self-pay | Admitting: Oncology

## 2016-07-27 DIAGNOSIS — E039 Hypothyroidism, unspecified: Secondary | ICD-10-CM | POA: Diagnosis present

## 2016-07-27 DIAGNOSIS — M25851 Other specified joint disorders, right hip: Secondary | ICD-10-CM | POA: Insufficient documentation

## 2016-07-27 DIAGNOSIS — J439 Emphysema, unspecified: Secondary | ICD-10-CM | POA: Insufficient documentation

## 2016-07-27 DIAGNOSIS — C3491 Malignant neoplasm of unspecified part of right bronchus or lung: Secondary | ICD-10-CM | POA: Diagnosis not present

## 2016-07-27 DIAGNOSIS — R590 Localized enlarged lymph nodes: Secondary | ICD-10-CM | POA: Insufficient documentation

## 2016-07-27 DIAGNOSIS — M799 Soft tissue disorder, unspecified: Secondary | ICD-10-CM | POA: Insufficient documentation

## 2016-07-27 DIAGNOSIS — Z9889 Other specified postprocedural states: Secondary | ICD-10-CM | POA: Diagnosis present

## 2016-07-27 DIAGNOSIS — Z5112 Encounter for antineoplastic immunotherapy: Secondary | ICD-10-CM

## 2016-07-27 MED ORDER — IOPAMIDOL (ISOVUE-300) INJECTION 61%
100.0000 mL | Freq: Once | INTRAVENOUS | Status: AC | PRN
  Administered 2016-07-27: 80 mL via INTRAVENOUS

## 2016-07-27 MED ORDER — IOPAMIDOL (ISOVUE-300) INJECTION 61%
INTRAVENOUS | Status: AC
  Filled 2016-07-27: qty 100

## 2016-07-30 ENCOUNTER — Encounter: Payer: Self-pay | Admitting: Internal Medicine

## 2016-07-30 ENCOUNTER — Telehealth: Payer: Self-pay | Admitting: Internal Medicine

## 2016-07-30 ENCOUNTER — Other Ambulatory Visit (HOSPITAL_BASED_OUTPATIENT_CLINIC_OR_DEPARTMENT_OTHER): Payer: Medicare Other

## 2016-07-30 ENCOUNTER — Ambulatory Visit (HOSPITAL_BASED_OUTPATIENT_CLINIC_OR_DEPARTMENT_OTHER): Payer: Medicare Other

## 2016-07-30 ENCOUNTER — Ambulatory Visit (HOSPITAL_BASED_OUTPATIENT_CLINIC_OR_DEPARTMENT_OTHER): Payer: Medicare Other | Admitting: Internal Medicine

## 2016-07-30 VITALS — BP 150/48 | HR 81 | Temp 98.4°F | Resp 18 | Ht 63.0 in | Wt 108.8 lb

## 2016-07-30 DIAGNOSIS — Z5111 Encounter for antineoplastic chemotherapy: Secondary | ICD-10-CM | POA: Diagnosis not present

## 2016-07-30 DIAGNOSIS — C7951 Secondary malignant neoplasm of bone: Secondary | ICD-10-CM | POA: Diagnosis not present

## 2016-07-30 DIAGNOSIS — I10 Essential (primary) hypertension: Secondary | ICD-10-CM | POA: Diagnosis not present

## 2016-07-30 DIAGNOSIS — C3431 Malignant neoplasm of lower lobe, right bronchus or lung: Secondary | ICD-10-CM

## 2016-07-30 DIAGNOSIS — C3491 Malignant neoplasm of unspecified part of right bronchus or lung: Secondary | ICD-10-CM

## 2016-07-30 DIAGNOSIS — Z5112 Encounter for antineoplastic immunotherapy: Secondary | ICD-10-CM

## 2016-07-30 DIAGNOSIS — E039 Hypothyroidism, unspecified: Secondary | ICD-10-CM

## 2016-07-30 LAB — CBC WITH DIFFERENTIAL/PLATELET
BASO%: 0.4 % (ref 0.0–2.0)
Basophils Absolute: 0 10*3/uL (ref 0.0–0.1)
EOS ABS: 0.6 10*3/uL — AB (ref 0.0–0.5)
EOS%: 8.5 % — ABNORMAL HIGH (ref 0.0–7.0)
HCT: 33.1 % — ABNORMAL LOW (ref 34.8–46.6)
HEMOGLOBIN: 11.5 g/dL — AB (ref 11.6–15.9)
LYMPH#: 1.6 10*3/uL (ref 0.9–3.3)
LYMPH%: 21.8 % (ref 14.0–49.7)
MCH: 28.7 pg (ref 25.1–34.0)
MCHC: 34.7 g/dL (ref 31.5–36.0)
MCV: 82.5 fL (ref 79.5–101.0)
MONO#: 0.5 10*3/uL (ref 0.1–0.9)
MONO%: 6.7 % (ref 0.0–14.0)
NEUT%: 62.6 % (ref 38.4–76.8)
NEUTROS ABS: 4.5 10*3/uL (ref 1.5–6.5)
NRBC: 0 % (ref 0–0)
PLATELETS: 275 10*3/uL (ref 145–400)
RBC: 4.01 10*6/uL (ref 3.70–5.45)
RDW: 13.9 % (ref 11.2–14.5)
WBC: 7.2 10*3/uL (ref 3.9–10.3)

## 2016-07-30 LAB — COMPREHENSIVE METABOLIC PANEL
ALT: 18 U/L (ref 0–55)
AST: 33 U/L (ref 5–34)
Albumin: 3.6 g/dL (ref 3.5–5.0)
Alkaline Phosphatase: 238 U/L — ABNORMAL HIGH (ref 40–150)
Anion Gap: 10 mEq/L (ref 3–11)
BILIRUBIN TOTAL: 0.26 mg/dL (ref 0.20–1.20)
BUN: 9.1 mg/dL (ref 7.0–26.0)
CO2: 23 meq/L (ref 22–29)
CREATININE: 1 mg/dL (ref 0.6–1.1)
Calcium: 9.2 mg/dL (ref 8.4–10.4)
Chloride: 108 mEq/L (ref 98–109)
EGFR: 65 mL/min/{1.73_m2} — ABNORMAL LOW (ref >90)
GLUCOSE: 104 mg/dL (ref 70–140)
Potassium: 4.1 mEq/L (ref 3.5–5.1)
SODIUM: 141 meq/L (ref 136–145)
TOTAL PROTEIN: 7.9 g/dL (ref 6.4–8.3)

## 2016-07-30 MED ORDER — SODIUM CHLORIDE 0.9 % IV SOLN
Freq: Once | INTRAVENOUS | Status: AC
  Administered 2016-07-30: 14:00:00 via INTRAVENOUS

## 2016-07-30 MED ORDER — PEMBROLIZUMAB CHEMO INJECTION 100 MG/4ML
200.0000 mg | Freq: Once | INTRAVENOUS | Status: AC
  Administered 2016-07-30: 200 mg via INTRAVENOUS
  Filled 2016-07-30: qty 8

## 2016-07-30 NOTE — Progress Notes (Signed)
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  Genomic Alterations Identified? NF1 G310f*9 ARID1B [id number] PBRM1 [id number] TP53 [id number] Additional Findings? Microsatellite status MS-Stable Tumor Mutation Burden TMB-Intermediate; 7 Muts/Mb Additional Disease-relevant Genes with No Reportable Alterations Identified? EGFR KRAS ALK BRAF MET ERBB2 RET ROS1  PRIOR THERAPY: Palliative radiotherapy to the metastatic bone lesion under the care of Dr. KinaSondra ComeURRENT THERAPY: First-line treatment with immunotherapy with Ketruda (pembrolizumab) 200 mg IV every 3 weeks status post 10 cycles. First cycle was given 12/21/2015.  INTERVAL HISTORY: Brittany Kunsmany66. female returns to the clinic today for follow-up visit accompanied by her sister and son. The patient is feeling fine today with no specific complaints. She was seen at an emergency Department in [place] ago for evaluation of chest pain and CT angiogram of the chest performed at that time showed no evidence for pulmonary embolism. She is feeling fine today with no specific complaints. She tolerated the last cycle of her treatment with KetrHungaryrly well. She denied having any current chest pain, shortness of breath, cough or hemoptysis. She denied having any fever or chills. She has no nausea, vomiting, diarrhea or constipation. She has a tooth that needs to be pulled out. She had repeat CT scan of the chest, abdomen and pelvis performed recently and she is here for evaluation and discussion of her scan results.   MEDICAL HISTORY: Past Medical History:    Diagnosis Date  . Adenocarcinoma of right lung, stage 4 (HCC)[place]/05/2015  . Complication of anesthesia    hard to wake up  . COPD (chronic obstructive pulmonary disease) (HCC)[place]. Eczema   . Encounter for antineoplastic chemotherapy 11/30/2015  . Heart murmur    told at age 66 b52 never has had any problems  . History of radiation therapy 01/26/16-02/07/16   right pelvis 30 Gy in 10 fractions  . HTN (hypertension) 02/01/2016  . 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 Lung mass   . Pneumonia 09/2015    ALLERGIES:  is allergic to penicillins.  MEDICATIONS:  Current Outpatient Prescriptions  Medication Sig Dispense Refill  . aspirin 325 MG tablet Take 325 mg by mouth daily.    . cetirizine (ZYRTEC) 10 MG tablet Take 1 tablet (10 mg total) by mouth daily. 30 tablet 2  . hydrocortisone cream 0.5 % Apply 1 application topically 2 (two) times daily. 30 g 2  . levothyroxine (SYNTHROID, LEVOTHROID) 100 MCG tablet Take 1 tablet (100 mcg total) by mouth daily before breakfast. 30 tablet 1  . Multiple Vitamin (MULTIVITAMIN) tablet Take 1 tablet by mouth daily.    . prochlorperazine (COMPAZINE) 10 MG tablet Take 1 tablet (10 mg total) by mouth every 6 (six) hours as needed for nausea or vomiting. 30 tablet 0   No current facility-administered medications for this visit.     SURGICAL HISTORY:  Past Surgical History:  Procedure Laterality Date  . ABDOMINAL HYSTERECTOMY    . OOPHORECTOMY  1996   tumor removed from right ovary  . TONSILLECTOMY    . VIDEO BRONCHOSCOPY WITH ENDOBRONCHIAL NAVIGATION N/A 11/18/2015   Procedure: VIDEO BRONCHOSCOPY WITH ENDOBRONCHIAL NAVIGATION;  Surgeon: Melrose Nakayama, MD;  Location: [facility];  Service: Thoracic;  Laterality: N/A;  . VIDEO BRONCHOSCOPY WITH ENDOBRONCHIAL ULTRASOUND N/A 11/18/2015   Procedure: VIDEO BRONCHOSCOPY WITH ENDOBRONCHIAL ULTRASOUND;  Surgeon: Melrose Nakayama, MD;  Location: [facility];  Service: Thoracic;  Laterality: N/A;    REVIEW  OF SYSTEMS:  Constitutional: positive for fatigue Eyes: negative Ears, nose, mouth, throat, and face: negative Respiratory: negative Cardiovascular: negative Gastrointestinal: negative Genitourinary:negative Integument/breast: negative Hematologic/lymphatic: negative Musculoskeletal:negative Neurological: negative Behavioral/Psych: negative Endocrine: negative Allergic/Immunologic: negative   PHYSICAL EXAMINATION: General appearance: alert, cooperative, fatigued and no distress Head: Normocephalic, without obvious abnormality, atraumatic Neck: no adenopathy, no JVD, supple, symmetrical, trachea midline and thyroid not enlarged, symmetric, no tenderness/mass/nodules Lymph nodes: Cervical, supraclavicular, and axillary nodes normal. Resp: clear to auscultation bilaterally Back: symmetric, no curvature. ROM normal. No CVA tenderness. Cardio: normal apical impulse GI: soft, non-tender; bowel sounds normal; no masses,  no organomegaly Extremities: extremities normal, atraumatic, no cyanosis or edema Neurologic: Alert and oriented X 3, normal strength and tone. Normal symmetric reflexes. Normal coordination and gait  ECOG PERFORMANCE STATUS: 0 - Asymptomatic  Blood pressure (!) 150/48, pulse 81, temperature 98.4 F (36.9 C), temperature source Oral, resp. rate 18, height 5' 3"  (1.6 m), weight 108 lb 12.8 oz (49.4 kg), SpO2 100 %.  LABORATORY DATA: Lab Results  Component Value Date   WBC 7.2 07/30/2016   HGB 11.5 (L) 07/30/2016   HCT 33.1 (L) 07/30/2016   MCV 82.5 07/30/2016   PLT 275 07/30/2016      Chemistry      Component Value Date/Time   NA 141 07/30/2016 1155   K 4.1 07/30/2016 1155   CL 103 11/15/2015 1512   CO2 23 07/30/2016 1155   BUN 9.1 07/30/2016 1155   CREATININE 1.0 07/30/2016 1155      Component Value Date/Time   CALCIUM 9.2 07/30/2016 1155   ALKPHOS 238 (H) 07/30/2016 1155   AST 33 07/30/2016 1155   ALT 18 07/30/2016 1155   BILITOT 0.26 07/30/2016 1155        RADIOGRAPHIC STUDIES: Ct Chest W Contrast  Addendum Date: 07/27/2016   ADDENDUM REPORT: 07/27/2016 15:01 ADDENDUM: As mentioned in the body of the report, there is a new circumferential collar of soft tissue attenuation around the SMA. This is nonspecific and may be related to edema although edema/inflammation from vasculitis can have this appearance. Less likely, isolated soft tissue surrounding the SMA can be a presentation of pancreatic adenocarcinoma. Close attention on follow-up recommended. Electronically Signed   By: Misty Stanley M.D.   On: 07/27/2016 15:01   Result Date: 07/27/2016 CLINICAL DATA:  Stage IV adenocarcinoma of the right lower lobe with metastatic right hilar and subcarinal lymphadenopathy and bone metastases diagnosed 2017 EXAM: CT CHEST, ABDOMEN, AND PELVIS WITH CONTRAST TECHNIQUE: Multidetector CT imaging of the chest, abdomen and pelvis was performed following the standard protocol during bolus administration of intravenous contrast. CONTRAST:  59m ISOVUE-300 IOPAMIDOL (ISOVUE-300) INJECTION 61% COMPARISON:  05/07/2016 FINDINGS: CT CHEST FINDINGS Cardiovascular: The heart size is normal. No pericardial effusion. Atherosclerotic calcification is noted in the wall of the thoracic aorta. Mediastinum/Nodes: 9 mm short axis subcarinal lymph node similar to prior. No other mediastinal lymphadenopathy. 11 mm short axis right hilar lymph node measured on the prior study is now 15 mm short axis. No left hilar lymphadenopathy. Tiny lymph node left supraclavicular region unchanged. No axillary lymphadenopathy Lungs/Pleura: Biapical pleural-parenchymal scarring as before. Centrilobular and paraseptal emphysema noted with bullous  change in the right upper lobe. 1.0 x 2.1 cm subpleural lesion posterior right lower lobe measured on the prior study is similar today measuring 1.2 x 2.2 cm. 4 mm nodule superior segment left lower lobe (image 63 series 4) is similar to prior. No focal airspace  consolidation. No pulmonary edema or pleural effusion. Musculoskeletal: Bone windows reveal no worrisome lytic or sclerotic osseous lesions. CT ABDOMEN PELVIS FINDINGS Hepatobiliary: No focal abnormality within the liver parenchyma. There is no evidence for gallstones, gallbladder wall thickening, or pericholecystic fluid. No intrahepatic or extrahepatic biliary dilation. Pancreas: No focal mass lesion. No dilatation of the main duct. No intraparenchymal cyst. No peripancreatic edema. Spleen: No splenomegaly. No focal mass lesion. Adrenals/Urinary Tract: No adrenal nodule or mass. Atrophy noted lower pole right kidney similar to prior. Left kidney unremarkable. No evidence for hydroureter. The urinary bladder appears normal for the degree of distention. Stomach/Bowel: Tiny hiatal hernia. Stomach otherwise unremarkable. Duodenum is normally positioned as is the ligament of Treitz. No small bowel wall thickening. No small bowel dilatation. The terminal ileum is normal. The appendix is normal. No gross colonic mass. No colonic wall thickening. No substantial diverticular change. Vascular/Lymphatic: There is abdominal aortic atherosclerosis without aneurysm. Interval development of a collarette of soft tissue attenuation around the proximal SMA without substantial luminal stenosis. There is no gastrohepatic or hepatoduodenal ligament lymphadenopathy. No intraperitoneal or retroperitoneal lymphadenopathy. No pelvic sidewall lymphadenopathy. Reproductive: Uterus surgically absent.  There is no adnexal mass. Other: No intraperitoneal free fluid. Musculoskeletal: Lucent lesion posterior right acetabulum similar to prior. Patient also has a 14 mm lucent lesion anterior aspect of the right acetabulum (image 106) similar to prior study. No other suspicious lytic or sclerotic osseous abnormality evident. IMPRESSION: 1. Continued interval progression of the right hilar lymph node now measuring 15 mm short axis. The patient has  completed a seven cycle course of Keytruda which can result in pseudo progression on imaging, but this immunotherapy was apparently completed about 2 months ago. As such, true progression is a concern and continued close attention recommended. Exam is otherwise stable. 2. No substantial change in the masslike posterior right lower lobe airspace opacity. 3. Stable lucent lesions right acetabulum. 4. Emphysema. Electronically Signed: By: Misty Stanley M.D. On: 07/27/2016 14:36   Ct Abdomen Pelvis W Contrast  Addendum Date: 07/27/2016   ADDENDUM REPORT: 07/27/2016 15:01 ADDENDUM: As mentioned in the body of the report, there is a new circumferential collar of soft tissue attenuation around the SMA. This is nonspecific and may be related to edema although edema/inflammation from vasculitis can have this appearance. Less likely, isolated soft tissue surrounding the SMA can be a presentation of pancreatic adenocarcinoma. Close attention on follow-up recommended. Electronically Signed   By: Misty Stanley M.D.   On: 07/27/2016 15:01   Result Date: 07/27/2016 CLINICAL DATA:  Stage IV adenocarcinoma of the right lower lobe with metastatic right hilar and subcarinal lymphadenopathy and bone metastases diagnosed 2017 EXAM: CT CHEST, ABDOMEN, AND PELVIS WITH CONTRAST TECHNIQUE: Multidetector CT imaging of the chest, abdomen and pelvis was performed following the standard protocol during bolus administration of intravenous contrast. CONTRAST:  3m ISOVUE-300 IOPAMIDOL (ISOVUE-300) INJECTION 61% COMPARISON:  05/07/2016 FINDINGS: CT CHEST FINDINGS Cardiovascular: The heart size is normal. No pericardial effusion. Atherosclerotic calcification is noted in the wall of the thoracic aorta. Mediastinum/Nodes: 9 mm short axis subcarinal lymph node similar to prior. No other mediastinal lymphadenopathy. 11 mm short axis right hilar lymph node measured on the prior study  is now 15 mm short axis. No left hilar lymphadenopathy. Tiny  lymph node left supraclavicular region unchanged. No axillary lymphadenopathy Lungs/Pleura: Biapical pleural-parenchymal scarring as before. Centrilobular and paraseptal emphysema noted with bullous change in the right upper lobe. 1.0 x 2.1 cm subpleural lesion posterior right lower lobe measured on the prior study is similar today measuring 1.2 x 2.2 cm. 4 mm nodule superior segment left lower lobe (image 63 series 4) is similar to prior. No focal airspace consolidation. No pulmonary edema or pleural effusion. Musculoskeletal: Bone windows reveal no worrisome lytic or sclerotic osseous lesions. CT ABDOMEN PELVIS FINDINGS Hepatobiliary: No focal abnormality within the liver parenchyma. There is no evidence for gallstones, gallbladder wall thickening, or pericholecystic fluid. No intrahepatic or extrahepatic biliary dilation. Pancreas: No focal mass lesion. No dilatation of the main duct. No intraparenchymal cyst. No peripancreatic edema. Spleen: No splenomegaly. No focal mass lesion. Adrenals/Urinary Tract: No adrenal nodule or mass. Atrophy noted lower pole right kidney similar to prior. Left kidney unremarkable. No evidence for hydroureter. The urinary bladder appears normal for the degree of distention. Stomach/Bowel: Tiny hiatal hernia. Stomach otherwise unremarkable. Duodenum is normally positioned as is the ligament of Treitz. No small bowel wall thickening. No small bowel dilatation. The terminal ileum is normal. The appendix is normal. No gross colonic mass. No colonic wall thickening. No substantial diverticular change. Vascular/Lymphatic: There is abdominal aortic atherosclerosis without aneurysm. Interval development of a collarette of soft tissue attenuation around the proximal SMA without substantial luminal stenosis. There is no gastrohepatic or hepatoduodenal ligament lymphadenopathy. No intraperitoneal or retroperitoneal lymphadenopathy. No pelvic sidewall lymphadenopathy. Reproductive: Uterus  surgically absent.  There is no adnexal mass. Other: No intraperitoneal free fluid. Musculoskeletal: Lucent lesion posterior right acetabulum similar to prior. Patient also has a 14 mm lucent lesion anterior aspect of the right acetabulum (image 106) similar to prior study. No other suspicious lytic or sclerotic osseous abnormality evident. IMPRESSION: 1. Continued interval progression of the right hilar lymph node now measuring 15 mm short axis. The patient has completed a seven cycle course of Keytruda which can result in pseudo progression on imaging, but this immunotherapy was apparently completed about 2 months ago. As such, true progression is a concern and continued close attention recommended. Exam is otherwise stable. 2. No substantial change in the masslike posterior right lower lobe airspace opacity. 3. Stable lucent lesions right acetabulum. 4. Emphysema. Electronically Signed: By: Misty Stanley M.D. On: 07/27/2016 14:36    ASSESSMENT AND PLAN:  This is a very pleasant 66 years old African-American female with metastatic non-small cell lung cancer, adenocarcinoma with positive PDL 1 expression of 99%. The patient is currently on treatment with Ketruda 200 mg IV every 3 weeks is status post 10 cycles and has been tolerating this treatment fairly well. She had repeat CT scan of the chest, abdomen and pelvis performed recently. I personally and independently reviewed the scan images and discussed the results with the patient and her family and showed them the images. Her CT scan showed mild increase in the right hilar lymph node which increased by a few millimeters compared to the previous scan. There is no other concerning findings. I recommended for the patient to continue her current treatment with [place] with the same dose. I will continue to monitor the right hilar lymph node on upcoming scans. Regarding the dental extraction, I recommended for the patient to do the procedure at least one  week before the next treatment. For hypertension she will  continue with her current blood pressure medication. The patient was advised to call immediately if she has any concerning symptoms in the interval. The patient voices understanding of current disease status and treatment options and is in agreement with the current care plan. All questions were answered. The patient knows to call the clinic with any problems, questions or concerns. We can certainly see the patient much sooner if necessary.  Disclaimer: This note was dictated with voice recognition software. Similar sounding words can inadvertently be transcribed and may not be corrected upon review.

## 2016-07-30 NOTE — Telephone Encounter (Signed)
Scheduled appt per 6/4 LOS - gave patient AVS and calender.

## 2016-07-31 ENCOUNTER — Encounter: Payer: Self-pay | Admitting: Pharmacy Technician

## 2016-07-31 NOTE — Progress Notes (Signed)
The patient is on Medicare and Drug Assistance is closed. If assistance is needed in the future please contact Pharmacy.

## 2016-08-03 ENCOUNTER — Other Ambulatory Visit: Payer: Self-pay | Admitting: *Deleted

## 2016-08-03 ENCOUNTER — Other Ambulatory Visit: Payer: Self-pay | Admitting: Medical Oncology

## 2016-08-03 DIAGNOSIS — J302 Other seasonal allergic rhinitis: Secondary | ICD-10-CM

## 2016-08-03 MED ORDER — CETIRIZINE HCL 10 MG PO TABS: 10.0000 mg | ORAL_TABLET | Freq: Every day | ORAL | 0 refills | Status: DC

## 2016-08-08 ENCOUNTER — Telehealth: Payer: Self-pay

## 2016-08-08 NOTE — Telephone Encounter (Signed)
Pt saw Dr [name] on 6/4. She had tooth problem at that time. It has been going on about 3 weeks and she has been trying to find a dentist this whole time.  It is bottom left side. Her face is getting swollen, she cannot open mouth too wide, no fever. She has been using salt water rinses off and on. No bad taste or exudate noted in mouth. It is getting worse. She has not been able to get ahold of dental clinic that did her original partial plate. She has tried to get another dentist unsuccessfully. Offices have told her that she needs to get rid of swelling before they will see her. She does not have PCP.   And also she needs synthroid refill.  Next lab/ MD//inf 6/25

## 2016-08-10 NOTE — Telephone Encounter (Signed)
Called pt unable to reach. LMOVM for pt to proceed to [facility] and wellness to get evaluated and establish a PCP, phone and address provided.

## 2016-08-20 ENCOUNTER — Other Ambulatory Visit (HOSPITAL_BASED_OUTPATIENT_CLINIC_OR_DEPARTMENT_OTHER): Payer: Medicare Other

## 2016-08-20 ENCOUNTER — Other Ambulatory Visit: Payer: Self-pay | Admitting: *Deleted

## 2016-08-20 ENCOUNTER — Ambulatory Visit (HOSPITAL_BASED_OUTPATIENT_CLINIC_OR_DEPARTMENT_OTHER): Payer: Medicare Other

## 2016-08-20 ENCOUNTER — Telehealth: Payer: Self-pay | Admitting: Internal Medicine

## 2016-08-20 ENCOUNTER — Other Ambulatory Visit: Payer: Self-pay | Admitting: Internal Medicine

## 2016-08-20 ENCOUNTER — Encounter: Payer: Self-pay | Admitting: Internal Medicine

## 2016-08-20 ENCOUNTER — Ambulatory Visit (HOSPITAL_BASED_OUTPATIENT_CLINIC_OR_DEPARTMENT_OTHER): Payer: Medicare Other | Admitting: Internal Medicine

## 2016-08-20 VITALS — BP 137/44 | HR 80 | Temp 98.7°F | Resp 17 | Ht 63.0 in | Wt 108.7 lb

## 2016-08-20 DIAGNOSIS — Z5112 Encounter for antineoplastic immunotherapy: Secondary | ICD-10-CM

## 2016-08-20 DIAGNOSIS — C3491 Malignant neoplasm of unspecified part of right bronchus or lung: Secondary | ICD-10-CM

## 2016-08-20 DIAGNOSIS — C3431 Malignant neoplasm of lower lobe, right bronchus or lung: Secondary | ICD-10-CM

## 2016-08-20 DIAGNOSIS — C7951 Secondary malignant neoplasm of bone: Secondary | ICD-10-CM | POA: Diagnosis not present

## 2016-08-20 LAB — COMPREHENSIVE METABOLIC PANEL
ALBUMIN: 3.7 g/dL (ref 3.5–5.0)
ALK PHOS: 307 U/L — AB (ref 40–150)
ALT: 26 U/L (ref 0–55)
AST: 40 U/L — AB (ref 5–34)
Anion Gap: 11 mEq/L (ref 3–11)
BILIRUBIN TOTAL: 0.25 mg/dL (ref 0.20–1.20)
BUN: 10 mg/dL (ref 7.0–26.0)
CO2: 26 mEq/L (ref 22–29)
CREATININE: 1.2 mg/dL — AB (ref 0.6–1.1)
Calcium: 9.7 mg/dL (ref 8.4–10.4)
Chloride: 106 mEq/L (ref 98–109)
EGFR: 55 mL/min/{1.73_m2} — AB (ref >90)
GLUCOSE: 89 mg/dL (ref 70–140)
Potassium: 3.7 mEq/L (ref 3.5–5.1)
SODIUM: 142 meq/L (ref 136–145)
Total Protein: 8.4 g/dL — ABNORMAL HIGH (ref 6.4–8.3)

## 2016-08-20 LAB — CBC WITH DIFFERENTIAL/PLATELET
BASO%: 0.5 % (ref 0.0–2.0)
Basophils Absolute: 0 10*3/uL (ref 0.0–0.1)
EOS%: 7.6 % — ABNORMAL HIGH (ref 0.0–7.0)
Eosinophils Absolute: 0.6 10*3/uL — ABNORMAL HIGH (ref 0.0–0.5)
HCT: 36.2 % (ref 34.8–46.6)
HGB: 12.3 g/dL (ref 11.6–15.9)
LYMPH%: 15.7 % (ref 14.0–49.7)
MCH: 29 pg (ref 25.1–34.0)
MCHC: 34 g/dL (ref 31.5–36.0)
MCV: 85.2 fL (ref 79.5–101.0)
MONO#: 0.4 10*3/uL (ref 0.1–0.9)
MONO%: 4.8 % (ref 0.0–14.0)
NEUT#: 6.1 10*3/uL (ref 1.5–6.5)
NEUT%: 71.4 % (ref 38.4–76.8)
PLATELETS: 274 10*3/uL (ref 145–400)
RBC: 4.24 10*6/uL (ref 3.70–5.45)
RDW: 14.2 % (ref 11.2–14.5)
WBC: 8.5 10*3/uL (ref 3.9–10.3)
lymph#: 1.3 10*3/uL (ref 0.9–3.3)

## 2016-08-20 MED ORDER — SODIUM CHLORIDE 0.9 % IV SOLN
Freq: Once | INTRAVENOUS | Status: AC
Start: 2016-08-20 — End: 2016-08-20
  Administered 2016-08-20: 14:00:00 via INTRAVENOUS

## 2016-08-20 MED ORDER — SODIUM CHLORIDE 0.9 % IV SOLN
200.0000 mg | Freq: Once | INTRAVENOUS | Status: AC
  Administered 2016-08-20: 200 mg via INTRAVENOUS
  Filled 2016-08-20: qty 8

## 2016-08-20 MED ORDER — LEVOTHYROXINE SODIUM 100 MCG PO TABS: 100.0000 ug | ORAL_TABLET | Freq: Every day | ORAL | 1 refills | Status: DC

## 2016-08-20 NOTE — Telephone Encounter (Signed)
Scheduled appt per 6/25 los - Gave patient AVS and calender per los.  

## 2016-08-20 NOTE — Progress Notes (Signed)
[facility] [phone]   [phone]  OFFICE PROGRESS NOTE  Patient, No Pcp Per No address on file  DIAGNOSIS: Stage IV (T1b, N2, M1 B) non-small cell lung cancer, adenocarcinoma with positive PDL 1 expression (99%), presented with right lower lobe lung nodule with metastatic right hilar and subcarinal lymphadenopathy as well as bone metastasis diagnosed in September 2017.  Genomic Alterations Identified? NF1 G341f*9 ARID1B [id number] PBRM1 [id number] TP53 P72f21f Additional Findings? Microsatellite status MS-Stable Tumor Mutation Burden TMB-Intermediate; 7 Muts/Mb Additional Disease-relevant Genes with No Reportable Alterations Identified? EGFR KRAS ALK BRAF MET ERBB2 RET ROS1  PRIOR THERAPY: Palliative radiotherapy to the metastatic bone lesion under the care of Dr. KinaSondra ComeURRENT THERAPY: First-line treatment with immunotherapy with Ketruda (pembrolizumab) 200 mg IV every 3 weeks status post 11 cycles. First cycle was given 12/21/2015.  INTERVAL HISTORY: Brittany Fusony66. female returns to the clinic today for follow-up visit accompanied by her son and sister. The patient is feeling fine today with no specific complaints. She is tolerating her treatment with KetrNat Mathrly well. She denied having any chest pain, shortness of breath, cough or hemoptysis. She has occasional low back pain with radiation to the right leg. She denied having any fever or chills. She has no nausea, vomiting, diarrhea or constipation. She is here today for evaluation before starting cycle #12.   MEDICAL HISTORY: Past Medical History:  Diagnosis Date  . Adenocarcinoma of right lung, stage 4 (HCC)[place]/05/2015  . Complication of anesthesia    hard to wake up  . COPD (chronic obstructive pulmonary disease) (HCC)[place]. Eczema   . Encounter for antineoplastic chemotherapy 11/30/2015  . Heart murmur    told at age 54 b101 never has had any problems  . History  of radiation therapy 01/26/16-02/07/16   right pelvis 30 Gy in 10 fractions  . HTN (hypertension) 02/01/2016  . 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 Lung mass   . Pneumonia 09/2015    ALLERGIES:  is allergic to penicillins.  MEDICATIONS:  Current Outpatient Prescriptions  Medication Sig Dispense Refill  . aspirin 325 MG tablet Take 325 mg by mouth daily.    . cetirizine (ZYRTEC) 10 MG tablet Take 1 tablet (10 mg total) by mouth daily. 30 tablet 0  . hydrocortisone cream 0.5 % Apply 1 application topically 2 (two) times daily. 30 g 2  . Multiple Vitamin (MULTIVITAMIN) tablet Take 1 tablet by mouth daily.    . prochlorperazine (COMPAZINE) 10 MG tablet Take 1 tablet (10 mg total) by mouth every 6 (six) hours as needed for nausea or vomiting. 30 tablet 0  . levothyroxine (SYNTHROID, LEVOTHROID) 100 MCG tablet Take 1 tablet (100 mcg total) by mouth daily before breakfast. 30 tablet 1   No current facility-administered medications for this visit.     SURGICAL HISTORY:  Past Surgical History:  Procedure Laterality Date  . ABDOMINAL HYSTERECTOMY    . OOPHORECTOMY  1996   tumor removed from right ovary  . TONSILLECTOMY    . VIDEO BRONCHOSCOPY WITH ENDOBRONCHIAL NAVIGATION N/A 11/18/2015   Procedure: VIDEO BRONCHOSCOPY WITH ENDOBRONCHIAL NAVIGATION;  Surgeon: StevMelrose Nakayama;  [facility];  Laterality: N/A;  . VIDEO BRONCHOSCOPY WITH ENDOBRONCHIAL ULTRASOUND N/A 11/18/2015   Procedure: VIDEO BRONCHOSCOPY WITH ENDOBRONCHIAL ULTRASOUND;  Surgeon: StevMelrose Nakayama;  [facility];  Laterality: N/A;    REVIEW OF SYSTEMS:  A comprehensive review  of systems was negative except for: Musculoskeletal: positive for back pain   PHYSICAL EXAMINATION: General appearance: alert, cooperative and no distress Head: Normocephalic, without obvious abnormality, atraumatic Neck: no adenopathy, no JVD, supple, symmetrical, trachea midline and  thyroid not enlarged, symmetric, no tenderness/mass/nodules Lymph nodes: Cervical, supraclavicular, and axillary nodes normal. Resp: clear to auscultation bilaterally Back: symmetric, no curvature. ROM normal. No CVA tenderness. Cardio: normal apical impulse GI: soft, non-tender; bowel sounds normal; no masses,  no organomegaly Extremities: extremities normal, atraumatic, no cyanosis or edema  ECOG PERFORMANCE STATUS: 0 - Asymptomatic  Blood pressure (!) 137/44, pulse 80, temperature 98.7 F (37.1 C), temperature source Oral, resp. rate 17, height 5' 3"  (1.6 m), weight 108 lb 11.2 oz (49.3 kg), SpO2 100 %.  LABORATORY DATA: Lab Results  Component Value Date   WBC 8.5 08/20/2016   HGB 12.3 08/20/2016   HCT 36.2 08/20/2016   MCV 85.2 08/20/2016   PLT 274 08/20/2016      Chemistry      Component Value Date/Time   NA 142 08/20/2016 1204   K 3.7 08/20/2016 1204   CL 103 11/15/2015 1512   CO2 26 08/20/2016 1204   BUN 10.0 08/20/2016 1204   CREATININE 1.2 (H) 08/20/2016 1204      Component Value Date/Time   CALCIUM 9.7 08/20/2016 1204   ALKPHOS 307 (H) 08/20/2016 1204   AST 40 (H) 08/20/2016 1204   ALT 26 08/20/2016 1204   BILITOT 0.25 08/20/2016 1204       RADIOGRAPHIC STUDIES: Ct Chest W Contrast  Addendum Date: 07/27/2016   ADDENDUM REPORT: 07/27/2016 15:01 ADDENDUM: As mentioned in the body of the report, there is a new circumferential collar of soft tissue attenuation around the SMA. This is nonspecific and may be related to edema although edema/inflammation from vasculitis can have this appearance. Less likely, isolated soft tissue surrounding the SMA can be a presentation of pancreatic adenocarcinoma. Close attention on follow-up recommended. Electronically Signed   By: Misty Stanley M.D.   On: 07/27/2016 15:01   Result Date: 07/27/2016 CLINICAL DATA:  Stage IV adenocarcinoma of the right lower lobe with metastatic right hilar and subcarinal lymphadenopathy and bone  metastases diagnosed 2017 EXAM: CT CHEST, ABDOMEN, AND PELVIS WITH CONTRAST TECHNIQUE: Multidetector CT imaging of the chest, abdomen and pelvis was performed following the standard protocol during bolus administration of intravenous contrast. CONTRAST:  16m ISOVUE-300 IOPAMIDOL (ISOVUE-300) INJECTION 61% COMPARISON:  05/07/2016 FINDINGS: CT CHEST FINDINGS Cardiovascular: The heart size is normal. No pericardial effusion. Atherosclerotic calcification is noted in the wall of the thoracic aorta. Mediastinum/Nodes: 9 mm short axis subcarinal lymph node similar to prior. No other mediastinal lymphadenopathy. 11 mm short axis right hilar lymph node measured on the prior study is now 15 mm short axis. No left hilar lymphadenopathy. Tiny lymph node left supraclavicular region unchanged. No axillary lymphadenopathy Lungs/Pleura: Biapical pleural-parenchymal scarring as before. Centrilobular and paraseptal emphysema noted with bullous change in the right upper lobe. 1.0 x 2.1 cm subpleural lesion posterior right lower lobe measured on the prior study is similar today measuring 1.2 x 2.2 cm. 4 mm nodule superior segment left lower lobe (image 63 series 4) is similar to prior. No focal airspace consolidation. No pulmonary edema or pleural effusion. Musculoskeletal: Bone windows reveal no worrisome lytic or sclerotic osseous lesions. CT ABDOMEN PELVIS FINDINGS Hepatobiliary: No focal abnormality within the liver parenchyma. There is no evidence for gallstones, gallbladder wall thickening, or pericholecystic fluid. No intrahepatic or extrahepatic biliary  dilation. Pancreas: No focal mass lesion. No dilatation of the main duct. No intraparenchymal cyst. No peripancreatic edema. Spleen: No splenomegaly. No focal mass lesion. Adrenals/Urinary Tract: No adrenal nodule or mass. Atrophy noted lower pole right kidney similar to prior. Left kidney unremarkable. No evidence for hydroureter. The urinary bladder appears normal for the  degree of distention. Stomach/Bowel: Tiny hiatal hernia. Stomach otherwise unremarkable. Duodenum is normally positioned as is the ligament of Treitz. No small bowel wall thickening. No small bowel dilatation. The terminal ileum is normal. The appendix is normal. No gross colonic mass. No colonic wall thickening. No substantial diverticular change. Vascular/Lymphatic: There is abdominal aortic atherosclerosis without aneurysm. Interval development of a collarette of soft tissue attenuation around the proximal SMA without substantial luminal stenosis. There is no gastrohepatic or hepatoduodenal ligament lymphadenopathy. No intraperitoneal or retroperitoneal lymphadenopathy. No pelvic sidewall lymphadenopathy. Reproductive: Uterus surgically absent.  There is no adnexal mass. Other: No intraperitoneal free fluid. Musculoskeletal: Lucent lesion posterior right acetabulum similar to prior. Patient also has a 14 mm lucent lesion anterior aspect of the right acetabulum (image 106) similar to prior study. No other suspicious lytic or sclerotic osseous abnormality evident. IMPRESSION: 1. Continued interval progression of the right hilar lymph node now measuring 15 mm short axis. The patient has completed a seven cycle course of Keytruda which can result in pseudo progression on imaging, but this immunotherapy was apparently completed about 2 months ago. As such, true progression is a concern and continued close attention recommended. Exam is otherwise stable. 2. No substantial change in the masslike posterior right lower lobe airspace opacity. 3. Stable lucent lesions right acetabulum. 4. Emphysema. Electronically Signed: By: Misty Stanley M.D. On: 07/27/2016 14:36   Ct Abdomen Pelvis W Contrast  Addendum Date: 07/27/2016   ADDENDUM REPORT: 07/27/2016 15:01 ADDENDUM: As mentioned in the body of the report, there is a new circumferential collar of soft tissue attenuation around the SMA. This is nonspecific and may be  related to edema although edema/inflammation from vasculitis can have this appearance. Less likely, isolated soft tissue surrounding the SMA can be a presentation of pancreatic adenocarcinoma. Close attention on follow-up recommended. Electronically Signed   By: Misty Stanley M.D.   On: 07/27/2016 15:01   Result Date: 07/27/2016 CLINICAL DATA:  Stage IV adenocarcinoma of the right lower lobe with metastatic right hilar and subcarinal lymphadenopathy and bone metastases diagnosed 2017 EXAM: CT CHEST, ABDOMEN, AND PELVIS WITH CONTRAST TECHNIQUE: Multidetector CT imaging of the chest, abdomen and pelvis was performed following the standard protocol during bolus administration of intravenous contrast. CONTRAST:  67m ISOVUE-300 IOPAMIDOL (ISOVUE-300) INJECTION 61% COMPARISON:  05/07/2016 FINDINGS: CT CHEST FINDINGS Cardiovascular: The heart size is normal. No pericardial effusion. Atherosclerotic calcification is noted in the wall of the thoracic aorta. Mediastinum/Nodes: 9 mm short axis subcarinal lymph node similar to prior. No other mediastinal lymphadenopathy. 11 mm short axis right hilar lymph node measured on the prior study is now 15 mm short axis. No left hilar lymphadenopathy. Tiny lymph node left supraclavicular region unchanged. No axillary lymphadenopathy Lungs/Pleura: Biapical pleural-parenchymal scarring as before. Centrilobular and paraseptal emphysema noted with bullous change in the right upper lobe. 1.0 x 2.1 cm subpleural lesion posterior right lower lobe measured on the prior study is similar today measuring 1.2 x 2.2 cm. 4 mm nodule superior segment left lower lobe (image 63 series 4) is similar to prior. No focal airspace consolidation. No pulmonary edema or pleural effusion. Musculoskeletal: Bone windows reveal no worrisome lytic  or sclerotic osseous lesions. CT ABDOMEN PELVIS FINDINGS Hepatobiliary: No focal abnormality within the liver parenchyma. There is no evidence for gallstones,  gallbladder wall thickening, or pericholecystic fluid. No intrahepatic or extrahepatic biliary dilation. Pancreas: No focal mass lesion. No dilatation of the main duct. No intraparenchymal cyst. No peripancreatic edema. Spleen: No splenomegaly. No focal mass lesion. Adrenals/Urinary Tract: No adrenal nodule or mass. Atrophy noted lower pole right kidney similar to prior. Left kidney unremarkable. No evidence for hydroureter. The urinary bladder appears normal for the degree of distention. Stomach/Bowel: Tiny hiatal hernia. Stomach otherwise unremarkable. Duodenum is normally positioned as is the ligament of Treitz. No small bowel wall thickening. No small bowel dilatation. The terminal ileum is normal. The appendix is normal. No gross colonic mass. No colonic wall thickening. No substantial diverticular change. Vascular/Lymphatic: There is abdominal aortic atherosclerosis without aneurysm. Interval development of a collarette of soft tissue attenuation around the proximal SMA without substantial luminal stenosis. There is no gastrohepatic or hepatoduodenal ligament lymphadenopathy. No intraperitoneal or retroperitoneal lymphadenopathy. No pelvic sidewall lymphadenopathy. Reproductive: Uterus surgically absent.  There is no adnexal mass. Other: No intraperitoneal free fluid. Musculoskeletal: Lucent lesion posterior right acetabulum similar to prior. Patient also has a 14 mm lucent lesion anterior aspect of the right acetabulum (image 106) similar to prior study. No other suspicious lytic or sclerotic osseous abnormality evident. IMPRESSION: 1. Continued interval progression of the right hilar lymph node now measuring 15 mm short axis. The patient has completed a seven cycle course of Keytruda which can result in pseudo progression on imaging, but this immunotherapy was apparently completed about 2 months ago. As such, true progression is a concern and continued close attention recommended. Exam is otherwise stable.  2. No substantial change in the masslike posterior right lower lobe airspace opacity. 3. Stable lucent lesions right acetabulum. 4. Emphysema. Electronically Signed: By: Misty Stanley M.D. On: 07/27/2016 14:36    ASSESSMENT AND PLAN:  This is a very pleasant 66 years old African-American female with metastatic non-small cell lung cancer, adenocarcinoma with positive PDL 1 expression of 99%. She is currently undergoing treatment with Ketruda 200 mg IV every 3 weeks, status post 11 cycles. She is tolerating the treatment well with no significant adverse effects. I recommended for her to proceed with cycle #12 today as a scheduled. I will see her back for follow-up visit in 3 weeks for evaluation before starting cycle #13. She was advised to call immediately if she has any concerning symptoms in the interval. The patient voices understanding of current disease status and treatment options and is in agreement with the current care plan. All questions were answered. The patient knows to call the clinic with any problems, questions or concerns. We can certainly see the patient much sooner if necessary.  Disclaimer: This note was dictated with voice recognition software. Similar sounding words can inadvertently be transcribed and may not be corrected upon review.

## 2016-08-20 NOTE — Patient Instructions (Signed)
[facility] Discharge Instructions for Patients Receiving Chemotherapy  Today you received the following chemotherapy agents Keytruda.  To help prevent nausea and vomiting after your treatment, we encourage you to take your nausea medication.   If you develop nausea and vomiting that is not controlled by your nausea medication, call the clinic.   BELOW ARE SYMPTOMS THAT SHOULD BE REPORTED IMMEDIATELY:  *FEVER GREATER THAN 100.5 F  *CHILLS WITH OR WITHOUT FEVER  NAUSEA AND VOMITING THAT IS NOT CONTROLLED WITH YOUR NAUSEA MEDICATION  *UNUSUAL SHORTNESS OF BREATH  *UNUSUAL BRUISING OR BLEEDING  TENDERNESS IN MOUTH AND THROAT WITH OR WITHOUT PRESENCE OF ULCERS  *URINARY PROBLEMS  *BOWEL PROBLEMS  UNUSUAL RASH Items with * indicate a potential emergency and should be followed up as soon as possible.  Feel free to call the clinic you have any questions or concerns. The clinic phone number is [phone].  Please show the [facility] at check-in to the Emergency Department and triage nurse.

## 2016-09-10 ENCOUNTER — Ambulatory Visit (HOSPITAL_BASED_OUTPATIENT_CLINIC_OR_DEPARTMENT_OTHER): Payer: Medicare Other | Admitting: Internal Medicine

## 2016-09-10 ENCOUNTER — Ambulatory Visit (HOSPITAL_BASED_OUTPATIENT_CLINIC_OR_DEPARTMENT_OTHER): Payer: Medicare Other

## 2016-09-10 ENCOUNTER — Encounter: Payer: Self-pay | Admitting: Internal Medicine

## 2016-09-10 ENCOUNTER — Other Ambulatory Visit (HOSPITAL_BASED_OUTPATIENT_CLINIC_OR_DEPARTMENT_OTHER): Payer: Medicare Other

## 2016-09-10 VITALS — BP 143/53 | HR 84 | Temp 98.3°F | Resp 18 | Ht 63.0 in | Wt 104.8 lb

## 2016-09-10 DIAGNOSIS — Z5112 Encounter for antineoplastic immunotherapy: Secondary | ICD-10-CM

## 2016-09-10 DIAGNOSIS — C3431 Malignant neoplasm of lower lobe, right bronchus or lung: Secondary | ICD-10-CM

## 2016-09-10 DIAGNOSIS — C3491 Malignant neoplasm of unspecified part of right bronchus or lung: Secondary | ICD-10-CM

## 2016-09-10 DIAGNOSIS — J302 Other seasonal allergic rhinitis: Secondary | ICD-10-CM

## 2016-09-10 DIAGNOSIS — C7951 Secondary malignant neoplasm of bone: Secondary | ICD-10-CM | POA: Diagnosis not present

## 2016-09-10 LAB — CBC WITH DIFFERENTIAL/PLATELET
BASO%: 0.6 % (ref 0.0–2.0)
Basophils Absolute: 0.1 10e3/uL (ref 0.0–0.1)
EOS%: 5.4 % (ref 0.0–7.0)
Eosinophils Absolute: 0.4 10e3/uL (ref 0.0–0.5)
HCT: 35.7 % (ref 34.8–46.6)
HGB: 12.4 g/dL (ref 11.6–15.9)
LYMPH%: 24.1 % (ref 14.0–49.7)
MCH: 28.1 pg (ref 25.1–34.0)
MCHC: 34.7 g/dL (ref 31.5–36.0)
MCV: 81 fL (ref 79.5–101.0)
MONO#: 0.6 10e3/uL (ref 0.1–0.9)
MONO%: 7 % (ref 0.0–14.0)
NEUT#: 5.2 10e3/uL (ref 1.5–6.5)
NEUT%: 62.9 % (ref 38.4–76.8)
Platelets: 292 10e3/uL (ref 145–400)
RBC: 4.41 10e6/uL (ref 3.70–5.45)
RDW: 13.7 % (ref 11.2–14.5)
WBC: 8.2 10e3/uL (ref 3.9–10.3)
lymph#: 2 10e3/uL (ref 0.9–3.3)
nRBC: 0 % (ref 0–0)

## 2016-09-10 LAB — COMPREHENSIVE METABOLIC PANEL WITH GFR
ALT: 19 U/L (ref 0–55)
AST: 35 U/L — ABNORMAL HIGH (ref 5–34)
Albumin: 3.8 g/dL (ref 3.5–5.0)
Alkaline Phosphatase: 306 U/L — ABNORMAL HIGH (ref 40–150)
Anion Gap: 9 meq/L (ref 3–11)
BUN: 18.5 mg/dL (ref 7.0–26.0)
CO2: 24 meq/L (ref 22–29)
Calcium: 9.9 mg/dL (ref 8.4–10.4)
Chloride: 104 meq/L (ref 98–109)
Creatinine: 1.2 mg/dL — ABNORMAL HIGH (ref 0.6–1.1)
EGFR: 55 ml/min/1.73 m2 — ABNORMAL LOW
Glucose: 81 mg/dL (ref 70–140)
Potassium: 4.6 meq/L (ref 3.5–5.1)
Sodium: 137 meq/L (ref 136–145)
Total Bilirubin: 0.41 mg/dL (ref 0.20–1.20)
Total Protein: 8.9 g/dL — ABNORMAL HIGH (ref 6.4–8.3)

## 2016-09-10 MED ORDER — PEMBROLIZUMAB CHEMO INJECTION 100 MG/4ML
200.0000 mg | Freq: Once | INTRAVENOUS | Status: AC
  Administered 2016-09-10: 200 mg via INTRAVENOUS
  Filled 2016-09-10: qty 8

## 2016-09-10 MED ORDER — CETIRIZINE HCL 10 MG PO TABS: 10.0000 mg | ORAL_TABLET | Freq: Every day | ORAL | 0 refills | Status: DC

## 2016-09-10 MED ORDER — SODIUM CHLORIDE 0.9 % IV SOLN
Freq: Once | INTRAVENOUS | Status: AC
  Administered 2016-09-10: 13:00:00 via INTRAVENOUS

## 2016-09-10 NOTE — Patient Instructions (Signed)
[facility] Discharge Instructions for Patients Receiving Chemotherapy  Today you received the following chemotherapy agents Keytruda.  To help prevent nausea and vomiting after your treatment, we encourage you to take your nausea medication.   If you develop nausea and vomiting that is not controlled by your nausea medication, call the clinic.   BELOW ARE SYMPTOMS THAT SHOULD BE REPORTED IMMEDIATELY:  *FEVER GREATER THAN 100.5 F  *CHILLS WITH OR WITHOUT FEVER  NAUSEA AND VOMITING THAT IS NOT CONTROLLED WITH YOUR NAUSEA MEDICATION  *UNUSUAL SHORTNESS OF BREATH  *UNUSUAL BRUISING OR BLEEDING  TENDERNESS IN MOUTH AND THROAT WITH OR WITHOUT PRESENCE OF ULCERS  *URINARY PROBLEMS  *BOWEL PROBLEMS  UNUSUAL RASH Items with * indicate a potential emergency and should be followed up as soon as possible.  Feel free to call the clinic you have any questions or concerns. The clinic phone number is [phone].  Please show the [facility] at check-in to the Emergency Department and triage nurse.

## 2016-09-10 NOTE — Progress Notes (Signed)
[facility] [phone]   [phone]) [phone]  OFFICE PROGRESS NOTE  Patient, No Pcp Per No address on file  DIAGNOSIS: Stage IV (T1b, N2, M1 B) non-small cell lung cancer, adenocarcinoma with positive PDL 1 expression (99%), presented with right lower lobe lung nodule with metastatic right hilar and subcarinal lymphadenopathy as well as bone metastasis diagnosed in September 2017.  Genomic Alterations Identified? NF1 G3104f*9 ARID1B [id number] PBRM1 [id number] TP53 P72f44f Additional Findings? Microsatellite status MS-Stable Tumor Mutation Burden TMB-Intermediate; 7 Muts/Mb Additional Disease-relevant Genes with No Reportable Alterations Identified? EGFR KRAS ALK BRAF MET ERBB2 RET ROS1  PRIOR THERAPY: Palliative radiotherapy to the metastatic bone lesion under the care of Dr. KinaSondra ComeURRENT THERAPY: First-line treatment with immunotherapy with Ketruda (pembrolizumab) 200 mg IV every 3 weeks status post 12 cycles. First cycle was given 12/21/2015.  INTERVAL HISTORY: Brittany Mercado. female returns to the clinic today for follow-up visit accompanied by her son. The patient is feeling fine today with no specific complaints. She continues to tolerate her treatment was continued fairly well. She denied having any skin rash or diarrhea. She denied having any fever or chills. She has no nausea, vomiting, diarrhea or constipation. She is here today for evaluation before starting cycle #13.   MEDICAL HISTORY: Past Medical History:  Diagnosis Date  . Adenocarcinoma of right lung, stage 4 (HCC)[place]/05/2015  . Complication of anesthesia    hard to wake up  . COPD (chronic obstructive pulmonary disease) (HCC)[place]. Eczema   . Encounter for antineoplastic chemotherapy 11/30/2015  . Heart murmur    told at age 30 b27 never has had any problems  . History of radiation therapy 01/26/16-02/07/16   right pelvis 30 Gy in 10 fractions  . HTN (hypertension)  02/01/2016  . 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 Lung mass   . Pneumonia 09/2015    ALLERGIES:  is allergic to penicillins.  MEDICATIONS:  Current Outpatient Prescriptions  Medication Sig Dispense Refill  . aspirin 325 MG tablet Take 325 mg by mouth daily.    . cetirizine (ZYRTEC) 10 MG tablet Take 1 tablet (10 mg total) by mouth daily. 30 tablet 0  . hydrocortisone cream 0.5 % Apply 1 application topically 2 (two) times daily. 30 g 2  . levothyroxine (SYNTHROID, LEVOTHROID) 100 MCG tablet Take 1 tablet (100 mcg total) by mouth daily before breakfast. 30 tablet 1  . Multiple Vitamin (MULTIVITAMIN) tablet Take 1 tablet by mouth daily.    . prochlorperazine (COMPAZINE) 10 MG tablet Take 1 tablet (10 mg total) by mouth every 6 (six) hours as needed for nausea or vomiting. (Patient not taking: Reported on 09/10/2016) 30 tablet 0   No current facility-administered medications for this visit.     SURGICAL HISTORY:  Past Surgical History:  Procedure Laterality Date  . ABDOMINAL HYSTERECTOMY    . OOPHORECTOMY  1996   tumor removed from right ovary  . TONSILLECTOMY    . VIDEO BRONCHOSCOPY WITH ENDOBRONCHIAL NAVIGATION N/A 11/18/2015   Procedure: VIDEO BRONCHOSCOPY WITH ENDOBRONCHIAL NAVIGATION;  Surgeon: StevMelrose Nakayama;  [facility];  Laterality: N/A;  . VIDEO BRONCHOSCOPY WITH ENDOBRONCHIAL ULTRASOUND N/A 11/18/2015   Procedure: VIDEO BRONCHOSCOPY WITH ENDOBRONCHIAL ULTRASOUND;  Surgeon: StevMelrose Nakayama;  [facility];  Laterality: N/A;    REVIEW OF SYSTEMS:  A comprehensive review of systems was negative.   PHYSICAL EXAMINATION: General appearance: alert,  cooperative and no distress Head: Normocephalic, without obvious abnormality, atraumatic Neck: no adenopathy, no JVD, supple, symmetrical, trachea midline and thyroid not enlarged, symmetric, no tenderness/mass/nodules Lymph nodes: Cervical, supraclavicular, and  axillary nodes normal. Resp: clear to auscultation bilaterally Back: symmetric, no curvature. ROM normal. No CVA tenderness. Cardio: normal apical impulse GI: soft, non-tender; bowel sounds normal; no masses,  no organomegaly Extremities: extremities normal, atraumatic, no cyanosis or edema  ECOG PERFORMANCE STATUS: 0 - Asymptomatic  Blood pressure (!) 143/53, pulse 84, temperature 98.3 F (36.8 C), temperature source Oral, resp. rate 18, height 5' 3"  (1.6 m), weight 104 lb 12.8 oz (47.5 kg), SpO2 98 %.  LABORATORY DATA: Lab Results  Component Value Date   WBC 8.5 08/20/2016   HGB 12.3 08/20/2016   HCT 36.2 08/20/2016   MCV 85.2 08/20/2016   PLT 274 08/20/2016      Chemistry      Component Value Date/Time   NA 137 09/10/2016 1051   K 4.6 09/10/2016 1051   CL 103 11/15/2015 1512   CO2 24 09/10/2016 1051   BUN 18.5 09/10/2016 1051   CREATININE 1.2 (H) 09/10/2016 1051      Component Value Date/Time   CALCIUM 9.9 09/10/2016 1051   ALKPHOS 306 (H) 09/10/2016 1051   AST 35 (H) 09/10/2016 1051   ALT 19 09/10/2016 1051   BILITOT 0.41 09/10/2016 1051       RADIOGRAPHIC STUDIES: No results found.  ASSESSMENT AND PLAN:  This is a very pleasant 66 years old African-American female with metastatic non-small cell lung cancer, adenocarcinoma with positive PDL 1 expression of 99%. She is currently undergoing treatment with Ketruda 200 mg IV every 3 weeks, status post 12 cycles. The patient continues to tolerate the treatment well. I recommended for her to proceed with cycle #16 today as scheduled. I will see her back for follow-up visit in 3 weeks for evaluation after repeating CT scan of the chest, abdomen and pelvis for restaging of her disease. She was advised to call immediately if she has any concerning symptoms in the interval. I will give her refill of Zyrtec today. The patient voices understanding of current disease status and treatment options and is in agreement with the  current care plan. All questions were answered. The patient knows to call the clinic with any problems, questions or concerns. We can certainly see the patient much sooner if necessary.  Disclaimer: This note was dictated with voice recognition software. Similar sounding words can inadvertently be transcribed and may not be corrected upon review.

## 2016-09-25 ENCOUNTER — Encounter (HOSPITAL_COMMUNITY): Payer: Self-pay

## 2016-09-25 ENCOUNTER — Ambulatory Visit (HOSPITAL_COMMUNITY)
Admission: RE | Admit: 2016-09-25 | Discharge: 2016-09-25 | Disposition: A | Discharge status: Home / Self Care | Payer: Medicare Other | Source: Ambulatory Visit | Attending: Internal Medicine | Admitting: Internal Medicine

## 2016-09-25 DIAGNOSIS — Z5112 Encounter for antineoplastic immunotherapy: Secondary | ICD-10-CM | POA: Diagnosis present

## 2016-09-25 DIAGNOSIS — J432 Centrilobular emphysema: Secondary | ICD-10-CM | POA: Diagnosis not present

## 2016-09-25 DIAGNOSIS — C3491 Malignant neoplasm of unspecified part of right bronchus or lung: Secondary | ICD-10-CM | POA: Diagnosis present

## 2016-09-25 DIAGNOSIS — I700 Atherosclerosis of aorta: Secondary | ICD-10-CM | POA: Diagnosis not present

## 2016-09-25 DIAGNOSIS — J302 Other seasonal allergic rhinitis: Secondary | ICD-10-CM | POA: Diagnosis present

## 2016-09-25 MED ORDER — IOPAMIDOL (ISOVUE-300) INJECTION 61%
80.0000 mL | Freq: Once | INTRAVENOUS | Status: AC | PRN
  Administered 2016-09-25: 80 mL via INTRAVENOUS

## 2016-09-25 MED ORDER — IOPAMIDOL (ISOVUE-300) INJECTION 61%
INTRAVENOUS | Status: AC
  Filled 2016-09-25: qty 100

## 2016-10-01 ENCOUNTER — Other Ambulatory Visit (HOSPITAL_BASED_OUTPATIENT_CLINIC_OR_DEPARTMENT_OTHER): Payer: Medicare Other

## 2016-10-01 ENCOUNTER — Encounter: Payer: Self-pay | Admitting: Internal Medicine

## 2016-10-01 ENCOUNTER — Ambulatory Visit (HOSPITAL_BASED_OUTPATIENT_CLINIC_OR_DEPARTMENT_OTHER): Payer: Medicare Other | Admitting: Internal Medicine

## 2016-10-01 ENCOUNTER — Ambulatory Visit (HOSPITAL_BASED_OUTPATIENT_CLINIC_OR_DEPARTMENT_OTHER): Payer: Medicare Other

## 2016-10-01 VITALS — BP 142/65 | HR 88 | Temp 98.4°F | Resp 20 | Ht 63.0 in | Wt 105.6 lb

## 2016-10-01 DIAGNOSIS — I10 Essential (primary) hypertension: Secondary | ICD-10-CM

## 2016-10-01 DIAGNOSIS — C3431 Malignant neoplasm of lower lobe, right bronchus or lung: Secondary | ICD-10-CM

## 2016-10-01 DIAGNOSIS — Z5112 Encounter for antineoplastic immunotherapy: Secondary | ICD-10-CM

## 2016-10-01 DIAGNOSIS — E039 Hypothyroidism, unspecified: Secondary | ICD-10-CM

## 2016-10-01 DIAGNOSIS — C3491 Malignant neoplasm of unspecified part of right bronchus or lung: Secondary | ICD-10-CM

## 2016-10-01 DIAGNOSIS — C7951 Secondary malignant neoplasm of bone: Secondary | ICD-10-CM

## 2016-10-01 LAB — CBC WITH DIFFERENTIAL/PLATELET
BASO%: 0.7 % (ref 0.0–2.0)
Basophils Absolute: 0.1 10*3/uL (ref 0.0–0.1)
EOS%: 4.3 % (ref 0.0–7.0)
Eosinophils Absolute: 0.3 10*3/uL (ref 0.0–0.5)
HEMATOCRIT: 30.1 % — AB (ref 34.8–46.6)
HGB: 10.1 g/dL — ABNORMAL LOW (ref 11.6–15.9)
LYMPH#: 1.4 10*3/uL (ref 0.9–3.3)
LYMPH%: 18.1 % (ref 14.0–49.7)
MCH: 26.8 pg (ref 25.1–34.0)
MCHC: 33.4 g/dL (ref 31.5–36.0)
MCV: 80.2 fL (ref 79.5–101.0)
MONO#: 0.5 10*3/uL (ref 0.1–0.9)
MONO%: 6.3 % (ref 0.0–14.0)
NEUT%: 70.6 % (ref 38.4–76.8)
NEUTROS ABS: 5.6 10*3/uL (ref 1.5–6.5)
Platelets: 347 10*3/uL (ref 145–400)
RBC: 3.75 10*6/uL (ref 3.70–5.45)
RDW: 14.1 % (ref 11.2–14.5)
WBC: 7.9 10*3/uL (ref 3.9–10.3)

## 2016-10-01 LAB — COMPREHENSIVE METABOLIC PANEL
ALT: 25 U/L (ref 0–55)
ANION GAP: 8 meq/L (ref 3–11)
AST: 40 U/L — ABNORMAL HIGH (ref 5–34)
Albumin: 3.6 g/dL (ref 3.5–5.0)
Alkaline Phosphatase: 326 U/L — ABNORMAL HIGH (ref 40–150)
BUN: 13.7 mg/dL (ref 7.0–26.0)
CALCIUM: 9.7 mg/dL (ref 8.4–10.4)
CO2: 25 mEq/L (ref 22–29)
Chloride: 105 mEq/L (ref 98–109)
Creatinine: 1.1 mg/dL (ref 0.6–1.1)
EGFR: 61 mL/min/{1.73_m2} — AB (ref >90)
GLUCOSE: 134 mg/dL (ref 70–140)
Potassium: 4.3 mEq/L (ref 3.5–5.1)
Sodium: 138 mEq/L (ref 136–145)
TOTAL PROTEIN: 8.2 g/dL (ref 6.4–8.3)
Total Bilirubin: 0.29 mg/dL (ref 0.20–1.20)

## 2016-10-01 MED ORDER — SODIUM CHLORIDE 0.9 % IV SOLN
Freq: Once | INTRAVENOUS | Status: AC
  Administered 2016-10-01: 13:00:00 via INTRAVENOUS

## 2016-10-01 MED ORDER — SODIUM CHLORIDE 0.9 % IV SOLN
200.0000 mg | Freq: Once | INTRAVENOUS | Status: AC
  Administered 2016-10-01: 200 mg via INTRAVENOUS
  Filled 2016-10-01: qty 8

## 2016-10-01 NOTE — Patient Instructions (Signed)
[facility] Discharge Instructions for Patients Receiving Chemotherapy  Today you received the following chemotherapy agents Keytruda  To help prevent nausea and vomiting after your treatment, we encourage you to take your nausea medication    If you develop nausea and vomiting that is not controlled by your nausea medication, call the clinic.   BELOW ARE SYMPTOMS THAT SHOULD BE REPORTED IMMEDIATELY:  *FEVER GREATER THAN 100.5 F  *CHILLS WITH OR WITHOUT FEVER  NAUSEA AND VOMITING THAT IS NOT CONTROLLED WITH YOUR NAUSEA MEDICATION  *UNUSUAL SHORTNESS OF BREATH  *UNUSUAL BRUISING OR BLEEDING  TENDERNESS IN MOUTH AND THROAT WITH OR WITHOUT PRESENCE OF ULCERS  *URINARY PROBLEMS  *BOWEL PROBLEMS  UNUSUAL RASH Items with * indicate a potential emergency and should be followed up as soon as possible.  Feel free to call the clinic you have any questions or concerns. The clinic phone number is [phone].  Please show the [facility] at check-in to the Emergency Department and triage nurse.

## 2016-10-01 NOTE — Progress Notes (Signed)
[facility] Telephone:([phone]) [phone]   [phone]  OFFICE PROGRESS NOTE  Patient, No Pcp Per No address on file  DIAGNOSIS: Stage IV (T1b, N2, M1 B) non-small cell lung cancer, adenocarcinoma with positive PDL 1 expression (99%), presented with right lower lobe lung nodule with metastatic right hilar and subcarinal lymphadenopathy as well as bone metastasis diagnosed in September 2017.  Genomic Alterations Identified? NF1 G326f*9 ARID1B [id number] PBRM1 [id number] TP53 [id number] Additional Findings? Microsatellite status MS-Stable Tumor Mutation Burden TMB-Intermediate; 7 Muts/Mb Additional Disease-relevant Genes with No Reportable Alterations Identified? EGFR KRAS ALK BRAF MET ERBB2 RET ROS1  PRIOR THERAPY: Palliative radiotherapy to the metastatic bone lesion under the care of Dr. KinaSondra ComeURRENT THERAPY: First-line treatment with immunotherapy with Ketruda (pembrolizumab) 200 mg IV every 3 weeks status post 13 cycles. First cycle was given 12/21/2015.  INTERVAL HISTORY: Brittany Holfordy66. female returns to the clinic today for follow-up visit accompanied by her son and granddaughter. The patient is feeling fine today was no specific complaints except for mild fatigue. She has been tolerating her treatment with immunotherapy with KetrNat Mathmbrolizumab) fairly well. She denied having any chest pain, shortness of breath, cough or hemoptysis. She denied having any nausea, vomiting, diarrhea or constipation. She has no significant weight loss or night sweats. She has no headache or visual changes. The patient had repeat CT scan of the chest, abdomen and pelvis performed recently and she is here today for evaluation and discussion of her scan results.   MEDICAL HISTORY: Past Medical History:  Diagnosis Date  . Adenocarcinoma of right lung, stage 4 (HCC)[place]/05/2015  . Complication of anesthesia    hard to wake up  . COPD (chronic obstructive pulmonary  disease) (HCC)[place]. Eczema   . Encounter for antineoplastic chemotherapy 11/30/2015  . Heart murmur    told at age 66 b39 never has had any problems  . History of radiation therapy 01/26/16-02/07/16   right pelvis 30 Gy in 10 fractions  . HTN (hypertension) 02/01/2016  . 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 Lung mass   . Pneumonia 09/2015    ALLERGIES:  is allergic to penicillins.  MEDICATIONS:  Current Outpatient Prescriptions  Medication Sig Dispense Refill  . aspirin 325 MG tablet Take 325 mg by mouth daily.    . cetirizine (ZYRTEC) 10 MG tablet Take 1 tablet (10 mg total) by mouth daily. 30 tablet 0  . hydrocortisone cream 0.5 % Apply 1 application topically 2 (two) times daily. 30 g 2  . levothyroxine (SYNTHROID, LEVOTHROID) 100 MCG tablet Take 1 tablet (100 mcg total) by mouth daily before breakfast. 30 tablet 1  . Multiple Vitamin (MULTIVITAMIN) tablet Take 1 tablet by mouth daily.    . prochlorperazine (COMPAZINE) 10 MG tablet Take 1 tablet (10 mg total) by mouth every 6 (six) hours as needed for nausea or vomiting. (Patient not taking: Reported on 09/10/2016) 30 tablet 0   No current facility-administered medications for this visit.     SURGICAL HISTORY:  Past Surgical History:  Procedure Laterality Date  . ABDOMINAL HYSTERECTOMY    . OOPHORECTOMY  1996   tumor removed from right ovary  . TONSILLECTOMY    . VIDEO BRONCHOSCOPY WITH ENDOBRONCHIAL NAVIGATION N/A 11/18/2015   Procedure: VIDEO BRONCHOSCOPY WITH ENDOBRONCHIAL NAVIGATION;  Surgeon: StevMelrose Nakayama;  [facility]: Thoracic;  Laterality: N/A;  . VIDEO BRONCHOSCOPY WITH ENDOBRONCHIAL ULTRASOUND N/A 11/18/2015   Procedure: VIDEO BRONCHOSCOPY  WITH ENDOBRONCHIAL ULTRASOUND;  Surgeon: Loreli Slot, MD;  Location: [facility] OR;  Service: Thoracic;  Laterality: N/A;    REVIEW OF SYSTEMS:  Constitutional: positive for fatigue Eyes: negative Ears, nose, mouth, throat, and face:  negative Respiratory: negative Cardiovascular: negative Gastrointestinal: negative Genitourinary:negative Integument/breast: negative Hematologic/lymphatic: negative Musculoskeletal:negative Neurological: negative Behavioral/Psych: negative Endocrine: negative Allergic/Immunologic: negative   PHYSICAL EXAMINATION: General appearance: alert, cooperative, fatigued and no distress Head: Normocephalic, without obvious abnormality, atraumatic Neck: no adenopathy, no JVD, supple, symmetrical, trachea midline and thyroid not enlarged, symmetric, no tenderness/mass/nodules Lymph nodes: Cervical, supraclavicular, and axillary nodes normal. Resp: clear to auscultation bilaterally Back: symmetric, no curvature. ROM normal. No CVA tenderness. Cardio: normal apical impulse GI: soft, non-tender; bowel sounds normal; no masses,  no organomegaly Extremities: extremities normal, atraumatic, no cyanosis or edema Neurologic: Alert and oriented X 3, normal strength and tone. Normal symmetric reflexes. Normal coordination and gait  ECOG PERFORMANCE STATUS: 1 - Symptomatic but completely ambulatory  Blood pressure (!) 142/65, pulse 88, temperature 98.4 F (36.9 C), temperature source Oral, resp. rate 20, height 5\' 3"  (1.6 m), weight 105 lb 9.6 oz (47.9 kg), SpO2 100 %.  LABORATORY DATA: Lab Results  Component Value Date   WBC 8.2 09/10/2016   HGB 12.4 09/10/2016   HCT 35.7 09/10/2016   MCV 81.0 09/10/2016   PLT 292 09/10/2016      Chemistry      Component Value Date/Time   NA 137 09/10/2016 1051   K 4.6 09/10/2016 1051   CL 103 11/15/2015 1512   CO2 24 09/10/2016 1051   BUN 18.5 09/10/2016 1051   CREATININE 1.2 (H) 09/10/2016 1051      Component Value Date/Time   CALCIUM 9.9 09/10/2016 1051   ALKPHOS 306 (H) 09/10/2016 1051   AST 35 (H) 09/10/2016 1051   ALT 19 09/10/2016 1051   BILITOT 0.41 09/10/2016 1051       RADIOGRAPHIC STUDIES: Ct Chest W Contrast  Result Date:  09/25/2016 CLINICAL DATA:  RIGHT lung cancer. Immunotherapy ongoing. Radiation therapy completed. EXAM: CT CHEST, ABDOMEN, AND PELVIS WITH CONTRAST TECHNIQUE: Multidetector CT imaging of the chest, abdomen and pelvis was performed following the standard protocol during bolus administration of intravenous contrast. CONTRAST:  70mL ISOVUE-300 IOPAMIDOL (ISOVUE-300) INJECTION 61% COMPARISON:  07/27/2016 FINDINGS: CT CHEST FINDINGS Cardiovascular: Coronary artery calcification and aortic atherosclerotic calcification. Mediastinum/Nodes: No axillary or supraclavicular adenopathy. No mediastinal adenopathy. Low-density RIGHT hilar lymph node measuring 15 mm x 19 mm compares to 15 mm 20 mm for no change. Lungs/Pleura: Rounded pleural-parenchymal thickening in the RIGHT lower lobe measuring 22 x 10 mm compares to 22 x 12 mm no change. There is extensive centrilobular emphysema in the upper lobes. No new nodularity. Musculoskeletal: No aggressive osseous lesion. CT ABDOMEN AND PELVIS FINDINGS Hepatobiliary: No focal hepatic lesion. No biliary ductal dilatation. Gallbladder is normal. Common bile duct is normal. Pancreas: Pancreas is normal. No ductal dilatation. No pancreatic inflammation. Spleen: Normal spleen Adrenals/urinary tract: Adrenal glands normal. Scarring in lower pole of the or RIGHT kidney. No renal obstruction. Ureters and bladder normal. Stomach/Bowel: Stomach, small bowel, appendix, and cecum are normal. The colon and rectosigmoid colon are normal. Vascular/Lymphatic: Abdominal aorta is normal caliber. No retroperitoneal or periportal lymphadenopathy. No pelvic lymphadenopathy. The previous described mild inflammation along the origin of the SMA has since improved. Reproductive: Post hysterectomy anatomy Other: No free fluid. Musculoskeletal: No aggressive osseous lesion. IMPRESSION: Chest Impression: 1. Stable RIGHT hilar necrotic lymphadenopathy. 2. Stable pleural-parenchymal rounded density  in the RIGHT  lower lobe. 3. No disease progression. 4. Extensive centrilobular emphysema. Abdomen / Pelvis Impression: 1. No evidence of metastatic disease in the abdomen pelvis. 2. Aortic Atherosclerosis (ICD10-I70.0) and Emphysema (ICD10-J43.9). Electronically Signed   By: Suzy Bouchard M.D.   On: 09/25/2016 13:29   Ct Abdomen Pelvis W Contrast  Result Date: 09/25/2016 CLINICAL DATA:  RIGHT lung cancer. Immunotherapy ongoing. Radiation therapy completed. EXAM: CT CHEST, ABDOMEN, AND PELVIS WITH CONTRAST TECHNIQUE: Multidetector CT imaging of the chest, abdomen and pelvis was performed following the standard protocol during bolus administration of intravenous contrast. CONTRAST:  47m ISOVUE-300 IOPAMIDOL (ISOVUE-300) INJECTION 61% COMPARISON:  07/27/2016 FINDINGS: CT CHEST FINDINGS Cardiovascular: Coronary artery calcification and aortic atherosclerotic calcification. Mediastinum/Nodes: No axillary or supraclavicular adenopathy. No mediastinal adenopathy. Low-density RIGHT hilar lymph node measuring 15 mm x 19 mm compares to 15 mm 20 mm for no change. Lungs/Pleura: Rounded pleural-parenchymal thickening in the RIGHT lower lobe measuring 22 x 10 mm compares to 22 x 12 mm no change. There is extensive centrilobular emphysema in the upper lobes. No new nodularity. Musculoskeletal: No aggressive osseous lesion. CT ABDOMEN AND PELVIS FINDINGS Hepatobiliary: No focal hepatic lesion. No biliary ductal dilatation. Gallbladder is normal. Common bile duct is normal. Pancreas: Pancreas is normal. No ductal dilatation. No pancreatic inflammation. Spleen: Normal spleen Adrenals/urinary tract: Adrenal glands normal. Scarring in lower pole of the or RIGHT kidney. No renal obstruction. Ureters and bladder normal. Stomach/Bowel: Stomach, small bowel, appendix, and cecum are normal. The colon and rectosigmoid colon are normal. Vascular/Lymphatic: Abdominal aorta is normal caliber. No retroperitoneal or periportal lymphadenopathy. No  pelvic lymphadenopathy. The previous described mild inflammation along the origin of the SMA has since improved. Reproductive: Post hysterectomy anatomy Other: No free fluid. Musculoskeletal: No aggressive osseous lesion. IMPRESSION: Chest Impression: 1. Stable RIGHT hilar necrotic lymphadenopathy. 2. Stable pleural-parenchymal rounded density in the RIGHT lower lobe. 3. No disease progression. 4. Extensive centrilobular emphysema. Abdomen / Pelvis Impression: 1. No evidence of metastatic disease in the abdomen pelvis. 2. Aortic Atherosclerosis (ICD10-I70.0) and Emphysema (ICD10-J43.9). Electronically Signed   By: SSuzy BouchardM.D.   On: 09/25/2016 13:29    ASSESSMENT AND PLAN:  This is a very pleasant 66years old African-American female with metastatic non-small cell lung cancer, adenocarcinoma with positive PDL 1 expression of 99%. She is currently undergoing treatment with Ketruda 200 mg IV every 3 weeks, status post 13 cycles. She tolerated the last cycle of her treatment fairly well. She had repeat CT scan of the chest, abdomen and pelvis. I personally and independently reviewed the scan images and discuss the results with the patient and her family today. Her scan showed no evidence for disease progression. I recommended for the patient to continue her current treatment with KHungaryand she will receive cycle #14 today. I will see her back for follow-up visit in 3 weeks for evaluation before starting the next cycle of her treatment. For hypertension the patient was advised to continue monitoring her blood pressure closed at home and to report to her primary care physician if no improvement. She was advised to call immediately if she has any concerning symptoms in the interval. The patient voices understanding of current disease status and treatment options and is in agreement with the current care plan. All questions were answered. The patient knows to call the clinic with any problems,  questions or concerns. We can certainly see the patient much sooner if necessary.  Disclaimer: This note was dictated with  voice recognition software. Similar sounding words can inadvertently be transcribed and may not be corrected upon review.

## 2016-10-18 ENCOUNTER — Other Ambulatory Visit: Payer: Self-pay | Admitting: Internal Medicine

## 2016-10-22 ENCOUNTER — Ambulatory Visit (HOSPITAL_BASED_OUTPATIENT_CLINIC_OR_DEPARTMENT_OTHER): Payer: Medicare Other

## 2016-10-22 ENCOUNTER — Encounter: Payer: Self-pay | Admitting: Internal Medicine

## 2016-10-22 ENCOUNTER — Telehealth: Payer: Self-pay | Admitting: Internal Medicine

## 2016-10-22 ENCOUNTER — Other Ambulatory Visit (HOSPITAL_BASED_OUTPATIENT_CLINIC_OR_DEPARTMENT_OTHER): Payer: Medicare Other

## 2016-10-22 ENCOUNTER — Ambulatory Visit (HOSPITAL_BASED_OUTPATIENT_CLINIC_OR_DEPARTMENT_OTHER): Payer: Medicare Other | Admitting: Internal Medicine

## 2016-10-22 VITALS — BP 161/53 | HR 80 | Temp 97.9°F | Resp 18 | Ht 63.0 in | Wt 101.6 lb

## 2016-10-22 VITALS — BP 102/36 | HR 79

## 2016-10-22 DIAGNOSIS — C7951 Secondary malignant neoplasm of bone: Secondary | ICD-10-CM

## 2016-10-22 DIAGNOSIS — C3431 Malignant neoplasm of lower lobe, right bronchus or lung: Secondary | ICD-10-CM | POA: Diagnosis present

## 2016-10-22 DIAGNOSIS — I10 Essential (primary) hypertension: Secondary | ICD-10-CM | POA: Diagnosis not present

## 2016-10-22 DIAGNOSIS — R634 Abnormal weight loss: Secondary | ICD-10-CM | POA: Diagnosis not present

## 2016-10-22 DIAGNOSIS — Z5112 Encounter for antineoplastic immunotherapy: Secondary | ICD-10-CM | POA: Diagnosis not present

## 2016-10-22 DIAGNOSIS — E039 Hypothyroidism, unspecified: Secondary | ICD-10-CM

## 2016-10-22 DIAGNOSIS — C3491 Malignant neoplasm of unspecified part of right bronchus or lung: Secondary | ICD-10-CM

## 2016-10-22 LAB — COMPREHENSIVE METABOLIC PANEL
ALT: 16 U/L (ref 0–55)
AST: 30 U/L (ref 5–34)
Albumin: 3.7 g/dL (ref 3.5–5.0)
Alkaline Phosphatase: 268 U/L — ABNORMAL HIGH (ref 40–150)
Anion Gap: 10 mEq/L (ref 3–11)
BUN: 14.8 mg/dL (ref 7.0–26.0)
CO2: 24 meq/L (ref 22–29)
Calcium: 9.8 mg/dL (ref 8.4–10.4)
Chloride: 104 mEq/L (ref 98–109)
Creatinine: 1.1 mg/dL (ref 0.6–1.1)
EGFR: 58 mL/min/{1.73_m2} — AB (ref >90)
GLUCOSE: 78 mg/dL (ref 70–140)
POTASSIUM: 4 meq/L (ref 3.5–5.1)
SODIUM: 139 meq/L (ref 136–145)
Total Bilirubin: 0.29 mg/dL (ref 0.20–1.20)
Total Protein: 8.8 g/dL — ABNORMAL HIGH (ref 6.4–8.3)

## 2016-10-22 LAB — CBC WITH DIFFERENTIAL/PLATELET
BASO%: 0.5 % (ref 0.0–2.0)
Basophils Absolute: 0 10*3/uL (ref 0.0–0.1)
EOS%: 4.2 % (ref 0.0–7.0)
Eosinophils Absolute: 0.4 10*3/uL (ref 0.0–0.5)
HEMATOCRIT: 28.3 % — AB (ref 34.8–46.6)
HEMOGLOBIN: 9.4 g/dL — AB (ref 11.6–15.9)
LYMPH#: 1.6 10*3/uL (ref 0.9–3.3)
LYMPH%: 17.8 % (ref 14.0–49.7)
MCH: 25.1 pg (ref 25.1–34.0)
MCHC: 33.1 g/dL (ref 31.5–36.0)
MCV: 75.9 fL — ABNORMAL LOW (ref 79.5–101.0)
MONO#: 0.4 10*3/uL (ref 0.1–0.9)
MONO%: 4.3 % (ref 0.0–14.0)
NEUT%: 73.2 % (ref 38.4–76.8)
NEUTROS ABS: 6.7 10*3/uL — AB (ref 1.5–6.5)
PLATELETS: 392 10*3/uL (ref 145–400)
RBC: 3.73 10*6/uL (ref 3.70–5.45)
RDW: 15.8 % — AB (ref 11.2–14.5)
WBC: 9.1 10*3/uL (ref 3.9–10.3)

## 2016-10-22 LAB — TSH: TSH: <0.08 m(IU)/L — ABNORMAL LOW (ref 0.308–3.960)

## 2016-10-22 MED ORDER — CLONIDINE HCL 0.1 MG PO TABS
0.1000 mg | ORAL_TABLET | Freq: Once | ORAL | Status: AC
  Administered 2016-10-22: 0.1 mg via ORAL

## 2016-10-22 MED ORDER — SODIUM CHLORIDE 0.9 % IV SOLN
Freq: Once | INTRAVENOUS | Status: AC
  Administered 2016-10-22: 12:00:00 via INTRAVENOUS

## 2016-10-22 MED ORDER — SODIUM CHLORIDE 0.9 % IV SOLN
200.0000 mg | Freq: Once | INTRAVENOUS | Status: AC
  Administered 2016-10-22: 200 mg via INTRAVENOUS
  Filled 2016-10-22: qty 8

## 2016-10-22 MED ORDER — CLONIDINE HCL 0.1 MG PO TABS
ORAL_TABLET | ORAL | Status: AC
  Filled 2016-10-22: qty 1

## 2016-10-22 NOTE — Patient Instructions (Signed)
[facility] Discharge Instructions for Patients Receiving Chemotherapy  Today you received the following chemotherapy agents Keytruda  To help prevent nausea and vomiting after your treatment, we encourage you to take your nausea medication    If you develop nausea and vomiting that is not controlled by your nausea medication, call the clinic.   BELOW ARE SYMPTOMS THAT SHOULD BE REPORTED IMMEDIATELY:  *FEVER GREATER THAN 100.5 F  *CHILLS WITH OR WITHOUT FEVER  NAUSEA AND VOMITING THAT IS NOT CONTROLLED WITH YOUR NAUSEA MEDICATION  *UNUSUAL SHORTNESS OF BREATH  *UNUSUAL BRUISING OR BLEEDING  TENDERNESS IN MOUTH AND THROAT WITH OR WITHOUT PRESENCE OF ULCERS  *URINARY PROBLEMS  *BOWEL PROBLEMS  UNUSUAL RASH Items with * indicate a potential emergency and should be followed up as soon as possible.  Feel free to call the clinic you have any questions or concerns. The clinic phone number is [phone].  Please show the [facility] at check-in to the Emergency Department and triage nurse.

## 2016-10-22 NOTE — Progress Notes (Signed)
[place] [phone]   [phone]  OFFICE PROGRESS NOTE  Patient, No Pcp Per No address on file  DIAGNOSIS: Stage IV (T1b, N2, M1 B) non-small cell lung cancer, adenocarcinoma with positive PDL 1 expression (99%), presented with right lower lobe lung nodule with metastatic right hilar and subcarinal lymphadenopathy as well as bone metastasis diagnosed in September 2017.  Genomic Alterations Identified? NF1 G377f*9 ARID1B [id number] PBRM1 [id number] TP53 P72f69f Additional Findings? Microsatellite status MS-Stable Tumor Mutation Burden TMB-Intermediate; 7 Muts/Mb Additional Disease-relevant Genes with No Reportable Alterations Identified? EGFR KRAS ALK BRAF MET ERBB2 RET ROS1  PRIOR THERAPY: Palliative radiotherapy to the metastatic bone lesion under the care of Dr. KinaSondra ComeURRENT THERAPY: First-line treatment with immunotherapy with Ketruda (pembrolizumab) 200 mg IV every 3 weeks status post 14 cycles. First cycle was given 12/21/2015.  INTERVAL HISTORY: Brittany Keeveny66. female returns to the clinic today for follow-up visit accompanied by her son. The patient is doing fine today was no specific complaints except for fatigue. She lost few pounds since her last visit. She denied having any chest pain, shortness of breath, cough or hemoptysis. She denied having any fever or chills. She has no nausea, vomiting, diarrhea or constipation. She is here today for evaluation before starting cycle #15.  MEDICAL HISTORY: Past Medical History:  Diagnosis Date  . Adenocarcinoma of right lung, stage 4 (HCC)[place]/05/2015  . Complication of anesthesia    hard to wake up  . COPD (chronic obstructive pulmonary disease) (HCC)[place]. Eczema   . Encounter for antineoplastic chemotherapy 11/30/2015  . Heart murmur    told at age 31 b32 never has had any problems  . History of radiation therapy 01/26/16-02/07/16   right pelvis 30 Gy in 10 fractions    . HTN (hypertension) 02/01/2016  . 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 Lung mass   . Pneumonia 09/2015    ALLERGIES:  is allergic to penicillins.  MEDICATIONS:  Current Outpatient Prescriptions  Medication Sig Dispense Refill  . aspirin 325 MG tablet Take 325 mg by mouth daily.    . cetirizine (ZYRTEC) 10 MG tablet Take 1 tablet (10 mg total) by mouth daily. 30 tablet 0  . hydrocortisone cream 0.5 % Apply 1 application topically 2 (two) times daily. 30 g 2  . levothyroxine (SYNTHROID, LEVOTHROID) 100 MCG tablet TAKE 1 TABLET(100 MCG) BY MOUTH DAILY BEFORE BREAKFAST 30 tablet 0  . Multiple Vitamin (MULTIVITAMIN) tablet Take 1 tablet by mouth daily.    . prochlorperazine (COMPAZINE) 10 MG tablet Take 1 tablet (10 mg total) by mouth every 6 (six) hours as needed for nausea or vomiting. (Patient not taking: Reported on 09/10/2016) 30 tablet 0   No current facility-administered medications for this visit.     SURGICAL HISTORY:  Past Surgical History:  Procedure Laterality Date  . ABDOMINAL HYSTERECTOMY    . OOPHORECTOMY  1996   tumor removed from right ovary  . TONSILLECTOMY    . VIDEO BRONCHOSCOPY WITH ENDOBRONCHIAL NAVIGATION N/A 11/18/2015   Procedure: VIDEO BRONCHOSCOPY WITH ENDOBRONCHIAL NAVIGATION;  Surgeon: StevMelrose Nakayama;  [facility];  Laterality: N/A;  . VIDEO BRONCHOSCOPY WITH ENDOBRONCHIAL ULTRASOUND N/A 11/18/2015   Procedure: VIDEO BRONCHOSCOPY WITH ENDOBRONCHIAL ULTRASOUND;  Surgeon: StevMelrose Nakayama;  [facility];  Laterality: N/A;    REVIEW OF SYSTEMS:  A comprehensive review of systems was negative except for: Constitutional: positive  for fatigue and weight loss   PHYSICAL EXAMINATION: General appearance: alert, cooperative, fatigued and no distress Head: Normocephalic, without obvious abnormality, atraumatic Neck: no adenopathy, no JVD, supple, symmetrical, trachea midline and thyroid not enlarged,  symmetric, no tenderness/mass/nodules Lymph nodes: Cervical, supraclavicular, and axillary nodes normal. Resp: clear to auscultation bilaterally Back: symmetric, no curvature. ROM normal. No CVA tenderness. Cardio: normal apical impulse GI: soft, non-tender; bowel sounds normal; no masses,  no organomegaly Extremities: extremities normal, atraumatic, no cyanosis or edema  ECOG PERFORMANCE STATUS: 1 - Symptomatic but completely ambulatory  Blood pressure (!) 161/53, pulse 80, temperature 97.9 F (36.6 C), temperature source Oral, resp. rate 18, height 5' 3"  (1.6 m), weight 101 lb 9.6 oz (46.1 kg), SpO2 100 %.  LABORATORY DATA: Lab Results  Component Value Date   WBC 9.1 10/22/2016   HGB 9.4 (L) 10/22/2016   HCT 28.3 (L) 10/22/2016   MCV 75.9 (L) 10/22/2016   PLT 392 10/22/2016      Chemistry      Component Value Date/Time   NA 138 10/01/2016 1148   K 4.3 10/01/2016 1148   CL 103 11/15/2015 1512   CO2 25 10/01/2016 1148   BUN 13.7 10/01/2016 1148   CREATININE 1.1 10/01/2016 1148      Component Value Date/Time   CALCIUM 9.7 10/01/2016 1148   ALKPHOS 326 (H) 10/01/2016 1148   AST 40 (H) 10/01/2016 1148   ALT 25 10/01/2016 1148   BILITOT 0.29 10/01/2016 1148       RADIOGRAPHIC STUDIES: Ct Chest W Contrast  Result Date: 09/25/2016 CLINICAL DATA:  RIGHT lung cancer. Immunotherapy ongoing. Radiation therapy completed. EXAM: CT CHEST, ABDOMEN, AND PELVIS WITH CONTRAST TECHNIQUE: Multidetector CT imaging of the chest, abdomen and pelvis was performed following the standard protocol during bolus administration of intravenous contrast. CONTRAST:  58m ISOVUE-300 IOPAMIDOL (ISOVUE-300) INJECTION 61% COMPARISON:  07/27/2016 FINDINGS: CT CHEST FINDINGS Cardiovascular: Coronary artery calcification and aortic atherosclerotic calcification. Mediastinum/Nodes: No axillary or supraclavicular adenopathy. No mediastinal adenopathy. Low-density RIGHT hilar lymph node measuring 15 mm x 19 mm  compares to 15 mm 20 mm for no change. Lungs/Pleura: Rounded pleural-parenchymal thickening in the RIGHT lower lobe measuring 22 x 10 mm compares to 22 x 12 mm no change. There is extensive centrilobular emphysema in the upper lobes. No new nodularity. Musculoskeletal: No aggressive osseous lesion. CT ABDOMEN AND PELVIS FINDINGS Hepatobiliary: No focal hepatic lesion. No biliary ductal dilatation. Gallbladder is normal. Common bile duct is normal. Pancreas: Pancreas is normal. No ductal dilatation. No pancreatic inflammation. Spleen: Normal spleen Adrenals/urinary tract: Adrenal glands normal. Scarring in lower pole of the or RIGHT kidney. No renal obstruction. Ureters and bladder normal. Stomach/Bowel: Stomach, small bowel, appendix, and cecum are normal. The colon and rectosigmoid colon are normal. Vascular/Lymphatic: Abdominal aorta is normal caliber. No retroperitoneal or periportal lymphadenopathy. No pelvic lymphadenopathy. The previous described mild inflammation along the origin of the SMA has since improved. Reproductive: Post hysterectomy anatomy Other: No free fluid. Musculoskeletal: No aggressive osseous lesion. IMPRESSION: Chest Impression: 1. Stable RIGHT hilar necrotic lymphadenopathy. 2. Stable pleural-parenchymal rounded density in the RIGHT lower lobe. 3. No disease progression. 4. Extensive centrilobular emphysema. Abdomen / Pelvis Impression: 1. No evidence of metastatic disease in the abdomen pelvis. 2. Aortic Atherosclerosis (ICD10-I70.0) and Emphysema (ICD10-J43.9). Electronically Signed   By: SSuzy BouchardM.D.   On: 09/25/2016 13:29   Ct Abdomen Pelvis W Contrast  Result Date: 09/25/2016 CLINICAL DATA:  RIGHT lung cancer. Immunotherapy ongoing. Radiation therapy  completed. EXAM: CT CHEST, ABDOMEN, AND PELVIS WITH CONTRAST TECHNIQUE: Multidetector CT imaging of the chest, abdomen and pelvis was performed following the standard protocol during bolus administration of intravenous  contrast. CONTRAST:  56m ISOVUE-300 IOPAMIDOL (ISOVUE-300) INJECTION 61% COMPARISON:  07/27/2016 FINDINGS: CT CHEST FINDINGS Cardiovascular: Coronary artery calcification and aortic atherosclerotic calcification. Mediastinum/Nodes: No axillary or supraclavicular adenopathy. No mediastinal adenopathy. Low-density RIGHT hilar lymph node measuring 15 mm x 19 mm compares to 15 mm 20 mm for no change. Lungs/Pleura: Rounded pleural-parenchymal thickening in the RIGHT lower lobe measuring 22 x 10 mm compares to 22 x 12 mm no change. There is extensive centrilobular emphysema in the upper lobes. No new nodularity. Musculoskeletal: No aggressive osseous lesion. CT ABDOMEN AND PELVIS FINDINGS Hepatobiliary: No focal hepatic lesion. No biliary ductal dilatation. Gallbladder is normal. Common bile duct is normal. Pancreas: Pancreas is normal. No ductal dilatation. No pancreatic inflammation. Spleen: Normal spleen Adrenals/urinary tract: Adrenal glands normal. Scarring in lower pole of the or RIGHT kidney. No renal obstruction. Ureters and bladder normal. Stomach/Bowel: Stomach, small bowel, appendix, and cecum are normal. The colon and rectosigmoid colon are normal. Vascular/Lymphatic: Abdominal aorta is normal caliber. No retroperitoneal or periportal lymphadenopathy. No pelvic lymphadenopathy. The previous described mild inflammation along the origin of the SMA has since improved. Reproductive: Post hysterectomy anatomy Other: No free fluid. Musculoskeletal: No aggressive osseous lesion. IMPRESSION: Chest Impression: 1. Stable RIGHT hilar necrotic lymphadenopathy. 2. Stable pleural-parenchymal rounded density in the RIGHT lower lobe. 3. No disease progression. 4. Extensive centrilobular emphysema. Abdomen / Pelvis Impression: 1. No evidence of metastatic disease in the abdomen pelvis. 2. Aortic Atherosclerosis (ICD10-I70.0) and Emphysema (ICD10-J43.9). Electronically Signed   By: SSuzy BouchardM.D.   On: 09/25/2016 13:29      ASSESSMENT AND PLAN:  This is a very pleasant 66years old African-American female with metastatic non-small cell lung cancer, adenocarcinoma with positive PDL 1 expression of 99%. She is currently undergoing treatment with Ketruda 200 mg IV every 3 weeks, status post 14 cycles. The patient continues to tolerate her treatment fairly well with no significant adverse effects except for mild fatigue. I recommended for her to proceed with cycle #15 today as scheduled. I will see her back for follow-up visit in 3 weeks for evaluation before starting cycle #16. For the hypertension, I referred the patient to primary care physician for evaluation and recommendation regarding her condition. I will also give the patient a dose of clonidine 0.1 mg by mouth 1 today. For weight loss, I strongly encouraged the patient to increase her oral intake and to eat [place] Nexium between meals. 4 hypothyroidism, she will continue with her current treatment with levothyroxine. She was advised to call immediately if she has any concerning symptoms in the interval. The patient voices understanding of current disease status and treatment options and is in agreement with the current care plan. All questions were answered. The patient knows to call the clinic with any problems, questions or concerns. We can certainly see the patient much sooner if necessary.  Disclaimer: This note was dictated with voice recognition software. Similar sounding words can inadvertently be transcribed and may not be corrected upon review.

## 2016-10-22 NOTE — Telephone Encounter (Signed)
Scheduled appt per 8/27 los - patient to get new schedule printed in the treatment area or next visit.

## 2016-11-12 ENCOUNTER — Other Ambulatory Visit (HOSPITAL_BASED_OUTPATIENT_CLINIC_OR_DEPARTMENT_OTHER): Payer: Medicare Other

## 2016-11-12 ENCOUNTER — Ambulatory Visit (HOSPITAL_BASED_OUTPATIENT_CLINIC_OR_DEPARTMENT_OTHER): Payer: Medicare Other

## 2016-11-12 ENCOUNTER — Telehealth: Payer: Self-pay | Admitting: Internal Medicine

## 2016-11-12 ENCOUNTER — Encounter: Payer: Self-pay | Admitting: Internal Medicine

## 2016-11-12 ENCOUNTER — Ambulatory Visit (HOSPITAL_BASED_OUTPATIENT_CLINIC_OR_DEPARTMENT_OTHER): Payer: Medicare Other | Admitting: Internal Medicine

## 2016-11-12 VITALS — BP 162/56 | HR 95 | Temp 97.7°F | Resp 18 | Ht 63.0 in | Wt 100.2 lb

## 2016-11-12 DIAGNOSIS — Z5112 Encounter for antineoplastic immunotherapy: Secondary | ICD-10-CM

## 2016-11-12 DIAGNOSIS — I10 Essential (primary) hypertension: Secondary | ICD-10-CM | POA: Diagnosis not present

## 2016-11-12 DIAGNOSIS — C3491 Malignant neoplasm of unspecified part of right bronchus or lung: Secondary | ICD-10-CM

## 2016-11-12 DIAGNOSIS — C3431 Malignant neoplasm of lower lobe, right bronchus or lung: Secondary | ICD-10-CM

## 2016-11-12 DIAGNOSIS — C3490 Malignant neoplasm of unspecified part of unspecified bronchus or lung: Secondary | ICD-10-CM

## 2016-11-12 LAB — COMPREHENSIVE METABOLIC PANEL
ALT: 18 U/L (ref 0–55)
ANION GAP: 10 meq/L (ref 3–11)
AST: 30 U/L (ref 5–34)
Albumin: 3.7 g/dL (ref 3.5–5.0)
Alkaline Phosphatase: 215 U/L — ABNORMAL HIGH (ref 40–150)
BILIRUBIN TOTAL: 0.3 mg/dL (ref 0.20–1.20)
BUN: 17.3 mg/dL (ref 7.0–26.0)
CO2: 23 meq/L (ref 22–29)
CREATININE: 1 mg/dL (ref 0.6–1.1)
Calcium: 9.8 mg/dL (ref 8.4–10.4)
Chloride: 104 mEq/L (ref 98–109)
EGFR: 69 mL/min/{1.73_m2} — ABNORMAL LOW (ref >90)
Glucose: 115 mg/dl (ref 70–140)
Potassium: 4.3 mEq/L (ref 3.5–5.1)
Sodium: 137 mEq/L (ref 136–145)
TOTAL PROTEIN: 8.6 g/dL — AB (ref 6.4–8.3)

## 2016-11-12 LAB — CBC WITH DIFFERENTIAL/PLATELET
BASO%: 0.1 % (ref 0.0–2.0)
Basophils Absolute: 0 10*3/uL (ref 0.0–0.1)
EOS ABS: 0.3 10*3/uL (ref 0.0–0.5)
EOS%: 3.7 % (ref 0.0–7.0)
HEMATOCRIT: 27.7 % — AB (ref 34.8–46.6)
HGB: 9 g/dL — ABNORMAL LOW (ref 11.6–15.9)
LYMPH#: 1.5 10*3/uL (ref 0.9–3.3)
LYMPH%: 19.3 % (ref 14.0–49.7)
MCH: 22.6 pg — ABNORMAL LOW (ref 25.1–34.0)
MCHC: 32.5 g/dL (ref 31.5–36.0)
MCV: 69.4 fL — ABNORMAL LOW (ref 79.5–101.0)
MONO#: 0.4 10*3/uL (ref 0.1–0.9)
MONO%: 5.1 % (ref 0.0–14.0)
NEUT%: 71.8 % (ref 38.4–76.8)
NEUTROS ABS: 5.4 10*3/uL (ref 1.5–6.5)
PLATELETS: 398 10*3/uL (ref 145–400)
RBC: 3.99 10*6/uL (ref 3.70–5.45)
RDW: 15.8 % — AB (ref 11.2–14.5)
WBC: 7.6 10*3/uL (ref 3.9–10.3)

## 2016-11-12 MED ORDER — SODIUM CHLORIDE 0.9 % IV SOLN
Freq: Once | INTRAVENOUS | Status: AC
  Administered 2016-11-12: 13:00:00 via INTRAVENOUS

## 2016-11-12 MED ORDER — SODIUM CHLORIDE 0.9 % IV SOLN
200.0000 mg | Freq: Once | INTRAVENOUS | Status: AC
  Administered 2016-11-12: 200 mg via INTRAVENOUS
  Filled 2016-11-12: qty 8

## 2016-11-12 NOTE — Patient Instructions (Signed)
[facility] Discharge Instructions for Patients Receiving Chemotherapy  Today you received the following chemotherapy agents pembrolizumab Beryle Flock)  To help prevent nausea and vomiting after your treatment, we encourage you to take your nausea medication    If you develop nausea and vomiting that is not controlled by your nausea medication, call the clinic.   BELOW ARE SYMPTOMS THAT SHOULD BE REPORTED IMMEDIATELY:  *FEVER GREATER THAN 100.5 F  *CHILLS WITH OR WITHOUT FEVER  NAUSEA AND VOMITING THAT IS NOT CONTROLLED WITH YOUR NAUSEA MEDICATION  *UNUSUAL SHORTNESS OF BREATH  *UNUSUAL BRUISING OR BLEEDING  TENDERNESS IN MOUTH AND THROAT WITH OR WITHOUT PRESENCE OF ULCERS  *URINARY PROBLEMS  *BOWEL PROBLEMS  UNUSUAL RASH Items with * indicate a potential emergency and should be followed up as soon as possible.  Feel free to call the clinic you have any questions or concerns. The clinic phone number is [phone].  Please show the [facility] at check-in to the Emergency Department and triage nurse.

## 2016-11-12 NOTE — Telephone Encounter (Signed)
Scheduled appt per 9/17 los - Gave patient AVS and calender per los. Central radiology to contact patient with ct scan

## 2016-11-12 NOTE — Progress Notes (Signed)
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  Genomic Alterations Identified? NF1 G39f*9 ARID1B [id number] PBRM1 [id number] TP53 P72f14f Additional Findings? Microsatellite status MS-Stable Tumor Mutation Burden TMB-Intermediate; 7 Muts/Mb Additional Disease-relevant Genes with No Reportable Alterations Identified? EGFR KRAS ALK BRAF MET ERBB2 RET ROS1  PRIOR THERAPY: Palliative radiotherapy to the metastatic bone lesion under the care of Dr. KinaSondra ComeURRENT THERAPY: First-line treatment with immunotherapy with Ketruda (pembrolizumab) 200 mg IV every 3 weeks status post 15 cycles. First cycle was given 12/21/2015.  INTERVAL HISTORY: Brittany Mercado. female returns to the clinic today for follow-up visit accompanied by her son. The patient is feeling fine today was no specific complaints. She tolerated the last cycle of her treatment well with no significant adverse effects. She denied having any chest pain, shortness of breath, cough or hemoptysis. She denied having any fatigue or weakness. She has no nausea, vomiting, diarrhea or constipation. The patient is here today for evaluation before starting cycle #16.  MEDICAL HISTORY: Past Medical History:  Diagnosis Date  . Adenocarcinoma of right lung, stage 4 (HCC)[place]/05/2015  . Complication of anesthesia    hard to wake up  . COPD (chronic obstructive pulmonary disease) (HCC)[place]. Eczema   . Encounter for antineoplastic chemotherapy 11/30/2015  . Heart murmur    told at age 53 b53 never has had any problems  . History of radiation therapy 01/26/16-02/07/16    right pelvis 30 Gy in 10 fractions  . HTN (hypertension) 02/01/2016  . 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 Lung mass   . Pneumonia 09/2015    ALLERGIES:  is allergic to penicillins.  MEDICATIONS:  Current Outpatient Prescriptions  Medication Sig Dispense Refill  . aspirin 325 MG tablet Take 325 mg by mouth daily.    . cetirizine (ZYRTEC) 10 MG tablet Take 1 tablet (10 mg total) by mouth daily. 30 tablet 0  . hydrocortisone cream 0.5 % Apply 1 application topically 2 (two) times daily. 30 g 2  . levothyroxine (SYNTHROID, LEVOTHROID) 100 MCG tablet TAKE 1 TABLET(100 MCG) BY MOUTH DAILY BEFORE BREAKFAST 30 tablet 0  . Multiple Vitamin (MULTIVITAMIN) tablet Take 1 tablet by mouth daily.    . prochlorperazine (COMPAZINE) 10 MG tablet Take 1 tablet (10 mg total) by mouth every 6 (six) hours as needed for nausea or vomiting. (Patient not taking: Reported on 09/10/2016) 30 tablet 0   No current facility-administered medications for this visit.     SURGICAL HISTORY:  Past Surgical History:  Procedure Laterality Date  . ABDOMINAL HYSTERECTOMY    . OOPHORECTOMY  1996   tumor removed from right ovary  . TONSILLECTOMY    . VIDEO BRONCHOSCOPY WITH ENDOBRONCHIAL NAVIGATION N/A 11/18/2015   Procedure: VIDEO BRONCHOSCOPY WITH ENDOBRONCHIAL NAVIGATION;  Surgeon: StevMelrose Nakayama;  [facility];  Laterality: N/A;  . VIDEO BRONCHOSCOPY WITH ENDOBRONCHIAL ULTRASOUND N/A 11/18/2015   Procedure: VIDEO BRONCHOSCOPY WITH ENDOBRONCHIAL ULTRASOUND;  Surgeon: StevMelrose Nakayama;  [facility];  Laterality: N/A;    REVIEW OF SYSTEMS:  A comprehensive review of systems was negative.  PHYSICAL EXAMINATION: General appearance: alert, cooperative and no distress Head: Normocephalic, without obvious abnormality, atraumatic Neck: no adenopathy, no JVD, supple, symmetrical, trachea midline and thyroid not enlarged, symmetric, no  tenderness/mass/nodules Lymph nodes: Cervical, supraclavicular, and axillary nodes normal. Resp: clear to auscultation bilaterally Back: symmetric, no curvature. ROM normal. No CVA tenderness. Cardio: normal apical impulse GI: soft, non-tender; bowel sounds normal; no masses,  no organomegaly Extremities: extremities normal, atraumatic, no cyanosis or edema  ECOG PERFORMANCE STATUS: 1 - Symptomatic but completely ambulatory  Blood pressure (!) 162/56, pulse 95, temperature 97.7 F (36.5 C), temperature source Oral, resp. rate 18, height 5' 3"  (1.6 m), weight 100 lb 3.2 oz (45.5 kg).  LABORATORY DATA: Lab Results  Component Value Date   WBC 7.6 11/12/2016   HGB 9.0 (L) 11/12/2016   HCT 27.7 (L) 11/12/2016   MCV 69.4 (L) 11/12/2016   PLT 398 11/12/2016      Chemistry      Component Value Date/Time   NA 139 10/22/2016 1124   K 4.0 10/22/2016 1124   CL 103 11/15/2015 1512   CO2 24 10/22/2016 1124   BUN 14.8 10/22/2016 1124   CREATININE 1.1 10/22/2016 1124      Component Value Date/Time   CALCIUM 9.8 10/22/2016 1124   ALKPHOS 268 (H) 10/22/2016 1124   AST 30 10/22/2016 1124   ALT 16 10/22/2016 1124   BILITOT 0.29 10/22/2016 1124       RADIOGRAPHIC STUDIES: No results found.  ASSESSMENT AND PLAN:  This is a very pleasant 66 years old African-American female with metastatic non-small cell lung cancer, adenocarcinoma with positive PDL 1 expression of 99%. She is currently undergoing treatment with Ketruda 200 mg IV every 3 weeks, status post 15 cycles. She tolerated the last cycle of her treatment well. I recommended for her to proceed with cycle #16 today as a scheduled. For the hypertension, the patient establish care with a primary care physician and I recommended for her to monitor her blood pressure closely at home and to report to him for consideration of treatment of her condition. She will come back for follow-up visit in 3 weeks for evaluation after repeating CT  scan of the chest, abdomen and pelvis for restaging of her disease. The patient was advised to call immediately if she has any concerning symptoms in the interval. The patient voices understanding of current disease status and treatment options and is in agreement with the current care plan. All questions were answered. The patient knows to call the clinic with any problems, questions or concerns. We can certainly see the patient much sooner if necessary.  Disclaimer: This note was dictated with voice recognition software. Similar sounding words can inadvertently be transcribed and may not be corrected upon review.

## 2016-11-14 ENCOUNTER — Other Ambulatory Visit: Payer: Self-pay | Admitting: Family Medicine

## 2016-11-14 ENCOUNTER — Ambulatory Visit (INDEPENDENT_AMBULATORY_CARE_PROVIDER_SITE_OTHER): Payer: Medicare Other | Admitting: Family Medicine

## 2016-11-14 ENCOUNTER — Encounter: Payer: Self-pay | Admitting: Family Medicine

## 2016-11-14 VITALS — BP 122/62 | HR 93 | Temp 97.9°F | Ht 63.0 in | Wt 100.4 lb

## 2016-11-14 DIAGNOSIS — M26629 Arthralgia of temporomandibular joint, unspecified side: Secondary | ICD-10-CM | POA: Diagnosis not present

## 2016-11-14 DIAGNOSIS — R0781 Pleurodynia: Secondary | ICD-10-CM | POA: Diagnosis not present

## 2016-11-14 DIAGNOSIS — K5900 Constipation, unspecified: Secondary | ICD-10-CM | POA: Diagnosis not present

## 2016-11-14 MED ORDER — MELOXICAM 7.5 MG PO TABS: 7.5000 mg | ORAL_TABLET | Freq: Every day | ORAL | 0 refills | Status: DC

## 2016-11-14 NOTE — Progress Notes (Signed)
Chief Complaint  Patient presents with  . Establish Care       New Patient Visit SUBJECTIVE: HPI: Brittany Mercado is an 66 y.o.female who is being seen for establishing care.  The patient was previously seen at only her oncologist's office   1 mo R rib pain; 8/10 sharp pain when she coughs, dull to nonexistant otherwise. Stretches/exercises improve pain. No injury or change in activity. Denies numbness or tingling.   Constipation Hard and small BM's over past mo progressively worsening. No bleeding, abd pain, N/V. She has tried Colace at home with little improvement. She is not eating/drinking as frequently.   Allergies  Allergen Reactions  . Penicillins Swelling    SWELLING REACTION UNSPECIFIED  Has patient had a PCN reaction causing immediate rash, facial/tongue/throat swelling, SOB or lightheadedness with hypotension: Yes Has patient had a PCN reaction causing severe rash involving mucus membranes or skin necrosis: No Has patient had a PCN reaction that required hospitalization No Has patient had a PCN reaction occurring within the last 10 years: No If all of the above answers are "NO", then may proceed with Cephalosporin use.    Past Medical History:  Diagnosis Date  . Adenocarcinoma of right lung, stage 4 ([place]) 11/30/2015  . Complication of anesthesia    hard to wake up  . Eczema   . Encounter for antineoplastic chemotherapy 11/30/2015  . Heart murmur    told at age 33 but never has had any problems  . History of radiation therapy 01/26/16-02/07/16   right pelvis 30 Gy in 10 fractions  . HTN (hypertension) 02/01/2016  . Hypothyroidism (acquired) 02/22/2016   Past Surgical History:  Procedure Laterality Date  . ABDOMINAL HYSTERECTOMY    . OOPHORECTOMY  1996   tumor removed from right ovary  . TONSILLECTOMY    . VIDEO BRONCHOSCOPY WITH ENDOBRONCHIAL NAVIGATION N/A 11/18/2015   Procedure: VIDEO BRONCHOSCOPY WITH ENDOBRONCHIAL NAVIGATION;  Surgeon: Melrose Nakayama, MD;   Location: [facility];  Service: Thoracic;  Laterality: N/A;  . VIDEO BRONCHOSCOPY WITH ENDOBRONCHIAL ULTRASOUND N/A 11/18/2015   Procedure: VIDEO BRONCHOSCOPY WITH ENDOBRONCHIAL ULTRASOUND;  Surgeon: Melrose Nakayama, MD;  Location: [facility];  Service: Thoracic;  Laterality: N/A;   Social History   Social History  . Marital status: Single   Occupational History  . retired - childcare    Social History Main Topics  . Smoking status: Former Smoker    Packs/day: 0.50    Years: 30.00    Quit date: 11/01/2015  . Smokeless tobacco: Never Used  . Alcohol use No  . Drug use: No  . Sexual activity: Not Currently    Birth control/ protection: Surgical   Family History  Problem Relation Age of Onset  . Colon cancer Neg Hx   . Esophageal cancer Neg Hx   . Rectal cancer Neg Hx   . Stomach cancer Neg Hx      Current Outpatient Prescriptions:  .  aspirin 325 MG tablet, Take 325 mg by mouth daily., Disp: , Rfl:  .  hydrocortisone cream 0.5 %, Apply 1 application topically 2 (two) times daily., Disp: 30 g, Rfl: 2 .  levothyroxine (SYNTHROID, LEVOTHROID) 100 MCG tablet, TAKE 1 TABLET(100 MCG) BY MOUTH DAILY BEFORE BREAKFAST, Disp: 30 tablet, Rfl: 0 .  Multiple Vitamin (MULTIVITAMIN) tablet, Take 1 tablet by mouth daily., Disp: , Rfl:  .  cetirizine (ZYRTEC) 10 MG tablet, Take 1 tablet (10 mg total) by mouth daily. (Patient not taking: Reported on 11/14/2016), Disp:  30 tablet, Rfl: 0 .  meloxicam (MOBIC) 7.5 MG tablet, Take 1 tablet (7.5 mg total) by mouth daily., Disp: 30 tablet, Rfl: 0  ROS Cardiovascular: Denies chest pain  Respiratory: Denies dyspnea   OBJECTIVE: BP 122/62 (BP Location: Right Arm, Patient Position: Sitting, Cuff Size: Normal)   Pulse 93   Temp 97.9 F (36.6 C)   Ht 5\' 3"  (1.6 m)   Wt 100 lb 6 oz (45.5 kg)   BMI 17.78 kg/m   Constitutional: -  VS reviewed -  Well developed, well nourished, appears stated age -  No apparent distress  Psychiatric: -  Oriented to  person, place, and time -  Memory intact -  Affect and mood normal -  Fluent conversation, good eye contact -  Judgment and insight age appropriate  Eye: -  Conjunctivae clear, no discharge -  Pupils symmetric, round, reactive to light  ENMT: -  MMM    Pharynx moist, no exudate, no erythema -  Poor dentition with missing teeth along mandibular jaw and edentulous along maxillary jaw  Neck: -  No gross swelling, no palpable masses -  Thyroid midline, not enlarged, mobile, no palpable masses  Cardiovascular: -  RRR -  No LE edema  Respiratory: -  Normal respiratory effort, no accessory muscle use, no retraction -  Breath sounds equal, no wheezes, no ronchi, no crackles  Gastrointestinal: -  Bowel sounds normal -  No tenderness, no distention, no guarding, no masses  Neurological:  -  CN II - XII grossly intact -  Sensation grossly intact to light touch, equal bilaterally  Musculoskeletal: -  +TTP over R rib cage over R 7-9, just posterior to ant ax line; no edema or deformity -  5/5 strength throughout UE's, equal -  +TTP over R TMJ, no clicking/locking of jt  Skin: -  No significant lesion on inspection -  Warm and dry to palpation   ASSESSMENT/PLAN: Constipation, unspecified constipation type  Rib pain on right side - Plan: meloxicam (MOBIC) 7.5 MG tablet  TMJ syndrome - Plan: meloxicam (MOBIC) 7.5 MG tablet  Patient instructed to sign release of records form from her previous PCP. Patient should return in 4 weeks to recheck thyroid levels. Will check Free T4 at that visit. The patient voiced understanding and agreement to the plan.   [place], DO 11/14/16  11:39 AM

## 2016-11-14 NOTE — Patient Instructions (Addendum)
Try MiraLAX 1-2 times daily over the next 3-4 days. If no improvement, try using an enema. Stay well hydrated and keep lots of fiber in your diet.  Let us know if you need anything.

## 2016-11-14 NOTE — Progress Notes (Signed)
Pre visit review using our clinic review tool, if applicable. No additional management support is needed unless otherwise documented below in the visit note. 

## 2016-11-17 ENCOUNTER — Other Ambulatory Visit: Payer: Self-pay | Admitting: Internal Medicine

## 2016-11-30 ENCOUNTER — Ambulatory Visit (HOSPITAL_COMMUNITY)
Admission: RE | Admit: 2016-11-30 | Discharge: 2016-11-30 | Disposition: A | Discharge status: Home / Self Care | Payer: Medicare Other | Source: Ambulatory Visit | Attending: Internal Medicine | Admitting: Internal Medicine

## 2016-11-30 ENCOUNTER — Other Ambulatory Visit: Payer: Self-pay | Admitting: *Deleted

## 2016-11-30 DIAGNOSIS — C3491 Malignant neoplasm of unspecified part of right bronchus or lung: Secondary | ICD-10-CM

## 2016-11-30 DIAGNOSIS — J432 Centrilobular emphysema: Secondary | ICD-10-CM | POA: Insufficient documentation

## 2016-11-30 DIAGNOSIS — I700 Atherosclerosis of aorta: Secondary | ICD-10-CM | POA: Insufficient documentation

## 2016-11-30 DIAGNOSIS — Z5112 Encounter for antineoplastic immunotherapy: Secondary | ICD-10-CM

## 2016-11-30 DIAGNOSIS — C3490 Malignant neoplasm of unspecified part of unspecified bronchus or lung: Secondary | ICD-10-CM | POA: Diagnosis not present

## 2016-11-30 DIAGNOSIS — R918 Other nonspecific abnormal finding of lung field: Secondary | ICD-10-CM | POA: Insufficient documentation

## 2016-11-30 MED ORDER — IOPAMIDOL (ISOVUE-300) INJECTION 61%
100.0000 mL | Freq: Once | INTRAVENOUS | Status: AC | PRN
  Administered 2016-11-30: 80 mL via INTRAVENOUS

## 2016-11-30 MED ORDER — IOPAMIDOL (ISOVUE-300) INJECTION 61%
INTRAVENOUS | Status: AC
  Filled 2016-11-30: qty 100

## 2016-12-03 ENCOUNTER — Encounter: Payer: Self-pay | Admitting: Internal Medicine

## 2016-12-03 ENCOUNTER — Telehealth: Payer: Self-pay | Admitting: Internal Medicine

## 2016-12-03 ENCOUNTER — Ambulatory Visit (HOSPITAL_BASED_OUTPATIENT_CLINIC_OR_DEPARTMENT_OTHER): Payer: Medicare Other

## 2016-12-03 ENCOUNTER — Ambulatory Visit (HOSPITAL_BASED_OUTPATIENT_CLINIC_OR_DEPARTMENT_OTHER): Payer: Medicare Other | Admitting: Internal Medicine

## 2016-12-03 ENCOUNTER — Other Ambulatory Visit (HOSPITAL_BASED_OUTPATIENT_CLINIC_OR_DEPARTMENT_OTHER): Payer: Medicare Other

## 2016-12-03 VITALS — BP 85/51 | HR 96 | Temp 97.5°F | Resp 18 | Ht 63.0 in | Wt 100.1 lb

## 2016-12-03 DIAGNOSIS — C7951 Secondary malignant neoplasm of bone: Secondary | ICD-10-CM

## 2016-12-03 DIAGNOSIS — D509 Iron deficiency anemia, unspecified: Secondary | ICD-10-CM | POA: Diagnosis not present

## 2016-12-03 DIAGNOSIS — Z5112 Encounter for antineoplastic immunotherapy: Secondary | ICD-10-CM

## 2016-12-03 DIAGNOSIS — C3431 Malignant neoplasm of lower lobe, right bronchus or lung: Secondary | ICD-10-CM

## 2016-12-03 DIAGNOSIS — C3491 Malignant neoplasm of unspecified part of right bronchus or lung: Secondary | ICD-10-CM

## 2016-12-03 DIAGNOSIS — E039 Hypothyroidism, unspecified: Secondary | ICD-10-CM | POA: Diagnosis not present

## 2016-12-03 LAB — TECHNOLOGIST REVIEW

## 2016-12-03 LAB — CBC WITH DIFFERENTIAL/PLATELET
BASO%: 0.3 % (ref 0.0–2.0)
BASOS ABS: 0 10*3/uL (ref 0.0–0.1)
EOS%: 3.6 % (ref 0.0–7.0)
Eosinophils Absolute: 0.3 10*3/uL (ref 0.0–0.5)
HCT: 24.2 % — ABNORMAL LOW (ref 34.8–46.6)
HGB: 7.5 g/dL — ABNORMAL LOW (ref 11.6–15.9)
LYMPH%: 21.9 % (ref 14.0–49.7)
MCH: 20.3 pg — ABNORMAL LOW (ref 25.1–34.0)
MCHC: 31 g/dL — AB (ref 31.5–36.0)
MCV: 65.6 fL — AB (ref 79.5–101.0)
MONO#: 0.6 10*3/uL (ref 0.1–0.9)
MONO%: 6.8 % (ref 0.0–14.0)
NEUT%: 67.4 % (ref 38.4–76.8)
NEUTROS ABS: 6 10*3/uL (ref 1.5–6.5)
NRBC: 0 % (ref 0–0)
PLATELETS: 376 10*3/uL (ref 145–400)
RBC: 3.69 10*6/uL — AB (ref 3.70–5.45)
RDW: 17 % — ABNORMAL HIGH (ref 11.2–14.5)
WBC: 8.9 10*3/uL (ref 3.9–10.3)
lymph#: 1.9 10*3/uL (ref 0.9–3.3)

## 2016-12-03 LAB — COMPREHENSIVE METABOLIC PANEL
ALT: 19 U/L (ref 0–55)
ANION GAP: 9 meq/L (ref 3–11)
AST: 30 U/L (ref 5–34)
Albumin: 3.6 g/dL (ref 3.5–5.0)
Alkaline Phosphatase: 206 U/L — ABNORMAL HIGH (ref 40–150)
BILIRUBIN TOTAL: 0.33 mg/dL (ref 0.20–1.20)
BUN: 11 mg/dL (ref 7.0–26.0)
CHLORIDE: 106 meq/L (ref 98–109)
CO2: 24 meq/L (ref 22–29)
Calcium: 9.6 mg/dL (ref 8.4–10.4)
Creatinine: 1.1 mg/dL (ref 0.6–1.1)
EGFR: 62 mL/min/{1.73_m2} — AB (ref >90)
GLUCOSE: 120 mg/dL (ref 70–140)
Potassium: 4.5 mEq/L (ref 3.5–5.1)
SODIUM: 139 meq/L (ref 136–145)
TOTAL PROTEIN: 8.3 g/dL (ref 6.4–8.3)

## 2016-12-03 MED ORDER — SODIUM CHLORIDE 0.9 % IV SOLN
200.0000 mg | Freq: Once | INTRAVENOUS | Status: AC
  Administered 2016-12-03: 200 mg via INTRAVENOUS
  Filled 2016-12-03: qty 8

## 2016-12-03 MED ORDER — SODIUM CHLORIDE 0.9 % IV SOLN
Freq: Once | INTRAVENOUS | Status: AC
Start: 2016-12-03 — End: 2016-12-03
  Administered 2016-12-03: 13:00:00 via INTRAVENOUS

## 2016-12-03 NOTE — Patient Instructions (Signed)
[facility] Discharge Instructions for Patients Receiving Chemotherapy  Today you received the following chemotherapy agents pembrolizumab Beryle Flock)  To help prevent nausea and vomiting after your treatment, we encourage you to take your nausea medication    If you develop nausea and vomiting that is not controlled by your nausea medication, call the clinic.   BELOW ARE SYMPTOMS THAT SHOULD BE REPORTED IMMEDIATELY:  *FEVER GREATER THAN 100.5 F  *CHILLS WITH OR WITHOUT FEVER  NAUSEA AND VOMITING THAT IS NOT CONTROLLED WITH YOUR NAUSEA MEDICATION  *UNUSUAL SHORTNESS OF BREATH  *UNUSUAL BRUISING OR BLEEDING  TENDERNESS IN MOUTH AND THROAT WITH OR WITHOUT PRESENCE OF ULCERS  *URINARY PROBLEMS  *BOWEL PROBLEMS  UNUSUAL RASH Items with * indicate a potential emergency and should be followed up as soon as possible.  Feel free to call the clinic you have any questions or concerns. The clinic phone number is [phone].  Please show the [facility] at check-in to the Emergency Department and triage nurse.

## 2016-12-03 NOTE — Progress Notes (Signed)
[facility] [phone]) [phone]   [phone]  OFFICE PROGRESS NOTE  [name], DO [address] [address]  DIAGNOSIS: Stage IV (T1b, N2, M1 B) non-small cell lung cancer, adenocarcinoma with positive PDL 1 expression (99%), presented with right lower lobe lung nodule with metastatic right hilar and subcarinal lymphadenopathy as well as bone metastasis diagnosed in September 2017.  Genomic Alterations Identified? NF1 G361f*9 ARID1B [id number] PBRM1 [id number] TP53 [id number] Additional Findings? Microsatellite status MS-Stable Tumor Mutation Burden TMB-Intermediate; 7 Muts/Mb Additional Disease-relevant Genes with No Reportable Alterations Identified? EGFR KRAS ALK BRAF MET ERBB2 RET ROS1  PRIOR THERAPY: Palliative radiotherapy to the metastatic bone lesion under the care of Dr. KinaSondra ComeURRENT THERAPY: First-line treatment with immunotherapy with Ketruda (pembrolizumab) 200 mg IV every 3 weeks status post 16 cycles. First cycle was given 12/21/2015.  INTERVAL HISTORY: Brittany Mercado. female returns to the clinic today for follow-up visit accompanied by her sister. The patient is feeling fine today with no specific complaints except for arthritis. She was started recently by her primary care physician on NSAIDs and the patient is currently on aspirin too. She is complaining of increasing fatigue. She denied having any chest pain, shortness of breath, cough or hemoptysis. She denied having any fever or chills. She has no nausea, vomiting, diarrhea or constipation. She continues to tolerate her treatment with KetrNat Mathrly well. The patient had repeat CT scan of the chest, abdomen and pelvis performed recently and she is here for evaluation and discussion of the scan results.  MEDICAL HISTORY: Past Medical History:  Diagnosis Date  . Adenocarcinoma of right lung, stage 4 ([facility])[place]/05/2015  . Complication  of anesthesia    hard to wake up  . Eczema   . Encounter for antineoplastic chemotherapy 11/30/2015  . Heart murmur    told at age 66 b42 never has had any problems  . History of radiation therapy 01/26/16-02/07/16   right pelvis 30 Gy in 10 fractions  . HTN (hypertension) 02/01/2016  . Hypothyroidism (acquired) 02/22/2016    ALLERGIES:  is allergic to penicillins.  MEDICATIONS:  Current Outpatient Prescriptions  Medication Sig Dispense Refill  . aspirin 325 MG tablet Take 325 mg by mouth daily.    . cetirizine (ZYRTEC) 10 MG tablet Take 1 tablet (10 mg total) by mouth daily. (Patient not taking: Reported on 11/14/2016) 30 tablet 0  . hydrocortisone cream 0.5 % Apply 1 application topically 2 (two) times daily. 30 g 2  . levothyroxine (SYNTHROID, LEVOTHROID) 100 MCG tablet TAKE 1 TABLET(100 MCG) BY MOUTH DAILY BEFORE BREAKFAST 30 tablet 0  . meloxicam (MOBIC) 7.5 MG tablet TAKE 1 TABLET(7.5 MG) BY MOUTH DAILY 90 tablet 0  . Multiple Vitamin (MULTIVITAMIN) tablet Take 1 tablet by mouth daily.     No current facility-administered medications for this visit.     SURGICAL HISTORY:  Past Surgical History:  Procedure Laterality Date  . ABDOMINAL HYSTERECTOMY    . OOPHORECTOMY  1996   tumor removed from right ovary  . TONSILLECTOMY    . VIDEO BRONCHOSCOPY WITH ENDOBRONCHIAL NAVIGATION N/A 11/18/2015   Procedure: VIDEO BRONCHOSCOPY WITH ENDOBRONCHIAL NAVIGATION;  Surgeon: StevMelrose Nakayama;  [facility];  Laterality: N/A;  . VIDEO BRONCHOSCOPY WITH ENDOBRONCHIAL ULTRASOUND N/A 11/18/2015   Procedure: VIDEO BRONCHOSCOPY WITH ENDOBRONCHIAL ULTRASOUND;  Surgeon: StevMelrose Nakayama;  [facility];  Laterality:  N/A;    REVIEW OF SYSTEMS:  Constitutional: positive for fatigue Eyes: negative Ears, nose, mouth, throat, and face: negative Respiratory: negative Cardiovascular: negative Gastrointestinal:  negative Genitourinary:negative Integument/breast: negative Hematologic/lymphatic: negative Musculoskeletal:positive for arthralgias Neurological: negative Behavioral/Psych: negative Endocrine: negative Allergic/Immunologic: negative   PHYSICAL EXAMINATION: General appearance: alert, cooperative, fatigued and no distress Head: Normocephalic, without obvious abnormality, atraumatic Neck: no adenopathy, no JVD, supple, symmetrical, trachea midline and thyroid not enlarged, symmetric, no tenderness/mass/nodules Lymph nodes: Cervical, supraclavicular, and axillary nodes normal. Resp: clear to auscultation bilaterally Back: symmetric, no curvature. ROM normal. No CVA tenderness. Cardio: normal apical impulse GI: soft, non-tender; bowel sounds normal; no masses,  no organomegaly Extremities: extremities normal, atraumatic, no cyanosis or edema Neurologic: Alert and oriented X 3, normal strength and tone. Normal symmetric reflexes. Normal coordination and gait  ECOG PERFORMANCE STATUS: 1 - Symptomatic but completely ambulatory  Blood pressure (!) 85/51, pulse 96, temperature (!) 97.5 F (36.4 C), temperature source Oral, resp. rate 18, height '5\' 3"'$  (1.6 m), weight 100 lb 1.6 oz (45.4 kg), SpO2 98 %.  LABORATORY DATA: Lab Results  Component Value Date   WBC 8.9 12/03/2016   HGB 7.5 (L) 12/03/2016   HCT 24.2 (L) 12/03/2016   MCV 65.6 (L) 12/03/2016   PLT 376 12/03/2016      Chemistry      Component Value Date/Time   NA 137 11/12/2016 1200   K 4.3 11/12/2016 1200   CL 103 11/15/2015 1512   CO2 23 11/12/2016 1200   BUN 17.3 11/12/2016 1200   CREATININE 1.0 11/12/2016 1200      Component Value Date/Time   CALCIUM 9.8 11/12/2016 1200   ALKPHOS 215 (H) 11/12/2016 1200   AST 30 11/12/2016 1200   ALT 18 11/12/2016 1200   BILITOT 0.30 11/12/2016 1200       RADIOGRAPHIC STUDIES: Ct Chest W Contrast  Result Date: 11/30/2016 CLINICAL DATA:  Followup lung cancer. Chemotherapy  ongoing. Radiation therapy complete. EXAM: CT CHEST, ABDOMEN, AND PELVIS WITH CONTRAST TECHNIQUE: Multidetector CT imaging of the chest, abdomen and pelvis was performed following the standard protocol during bolus administration of intravenous contrast. CONTRAST:  32m ISOVUE-300 IOPAMIDOL (ISOVUE-300) INJECTION 61% COMPARISON:  CT 09/25/2016 FINDINGS: CT CHEST FINDINGS Cardiovascular: No significant vascular findings. Normal heart size. No pericardial effusion. Mediastinum/Nodes: No axillary supraclavicular adenopathy. Enlarged necrotic RIGHT hilar lymph node measures 23 by 12 mm compared with 20 by 12 mm for no significant change. No mediastinal adenopathy. Lungs/Pleura: Nodular thickening along the posterior surface of the RIGHT lower lobe measures 18 mm in greatest dimension (image 70 3, series 4) which compares to 23 mm on prior. There is extensive centrilobular emphysema in the upper lobes. No new nodularity. Angular nodule in the superior segment LEFT lower lobe measuring 6 mm unchanged Musculoskeletal: No aggressive osseous lesion. CT ABDOMEN PELVIS FINDINGS Hepatobiliary: No focal hepatic lesion. No biliary duct dilatation. Gallbladder is normal. Common bile duct is normal. Pancreas: Pancreas is normal. No ductal dilatation. No pancreatic inflammation. Spleen: Normal spleen Adrenals/urinary tract: Thickening of the RIGHT adrenal gland is not changed. LEFT adrenal gland normal. Kidneys normal. Ureters bladder normal. Stomach/Bowel: Stomach, small bowel, appendix, and cecum are normal. The colon and rectosigmoid colon are normal. Vascular/Lymphatic: Abdominal aorta is normal caliber with atherosclerotic calcification. There is no retroperitoneal or periportal lymphadenopathy. No pelvic lymphadenopathy. Reproductive: Post hysterectomy. Other: No free fluid. Musculoskeletal: No aggressive osseous lesion. IMPRESSION: Chest Impression: 1. Stable necrotic node in the RIGHT hilum. 2. Mild contraction of  pleural-based nodular consolidation in the RIGHT lower lobe. 3. Extensive centrilobular emphysema. 4. No evidence of lung cancer progression Abdomen / Pelvis Impression: 1. No evidence of metastatic disease in the abdomen pelvis. 2. No skeletal metastasis. 3. Aortic Atherosclerosis (ICD10-I70.0) and Emphysema (ICD10-J43.9). Electronically Signed   By: Suzy Bouchard M.D.   On: 11/30/2016 14:08   Ct Abdomen Pelvis W Contrast  Result Date: 11/30/2016 CLINICAL DATA:  Followup lung cancer. Chemotherapy ongoing. Radiation therapy complete. EXAM: CT CHEST, ABDOMEN, AND PELVIS WITH CONTRAST TECHNIQUE: Multidetector CT imaging of the chest, abdomen and pelvis was performed following the standard protocol during bolus administration of intravenous contrast. CONTRAST:  27m ISOVUE-300 IOPAMIDOL (ISOVUE-300) INJECTION 61% COMPARISON:  CT 09/25/2016 FINDINGS: CT CHEST FINDINGS Cardiovascular: No significant vascular findings. Normal heart size. No pericardial effusion. Mediastinum/Nodes: No axillary supraclavicular adenopathy. Enlarged necrotic RIGHT hilar lymph node measures 23 by 12 mm compared with 20 by 12 mm for no significant change. No mediastinal adenopathy. Lungs/Pleura: Nodular thickening along the posterior surface of the RIGHT lower lobe measures 18 mm in greatest dimension (image 70 3, series 4) which compares to 23 mm on prior. There is extensive centrilobular emphysema in the upper lobes. No new nodularity. Angular nodule in the superior segment LEFT lower lobe measuring 6 mm unchanged Musculoskeletal: No aggressive osseous lesion. CT ABDOMEN PELVIS FINDINGS Hepatobiliary: No focal hepatic lesion. No biliary duct dilatation. Gallbladder is normal. Common bile duct is normal. Pancreas: Pancreas is normal. No ductal dilatation. No pancreatic inflammation. Spleen: Normal spleen Adrenals/urinary tract: Thickening of the RIGHT adrenal gland is not changed. LEFT adrenal gland normal. Kidneys normal. Ureters  bladder normal. Stomach/Bowel: Stomach, small bowel, appendix, and cecum are normal. The colon and rectosigmoid colon are normal. Vascular/Lymphatic: Abdominal aorta is normal caliber with atherosclerotic calcification. There is no retroperitoneal or periportal lymphadenopathy. No pelvic lymphadenopathy. Reproductive: Post hysterectomy. Other: No free fluid. Musculoskeletal: No aggressive osseous lesion. IMPRESSION: Chest Impression: 1. Stable necrotic node in the RIGHT hilum. 2. Mild contraction of pleural-based nodular consolidation in the RIGHT lower lobe. 3. Extensive centrilobular emphysema. 4. No evidence of lung cancer progression Abdomen / Pelvis Impression: 1. No evidence of metastatic disease in the abdomen pelvis. 2. No skeletal metastasis. 3. Aortic Atherosclerosis (ICD10-I70.0) and Emphysema (ICD10-J43.9). Electronically Signed   By: SSuzy BouchardM.D.   On: 11/30/2016 14:08    ASSESSMENT AND PLAN:  This is a very pleasant 66years old African-American female with metastatic non-small cell lung cancer, adenocarcinoma with positive PDL 1 expression of 99%. She is currently undergoing treatment with Ketruda 200 mg IV every 3 weeks, status post 16 cycles. The patient continues to tolerate her treatment fairly well with no significant adverse effects. She had repeat CT scan of the chest, abdomen and pelvis performed recently. I personally and independently reviewed the scan images and discuss the results with the patient and her sister today. Her scan showed no concerning findings for disease progression. She tolerated the last cycle of her treatment well. I recommended for the patient to continue her current treatment with KHungaryand she will proceed with cycle #17 today. For the microcytic anemia this is most likely iron deficiency anemia. I recommended for the patient to start taking oral iron tablets at regular basis. I will also check her stool for Hemoccult. The patient would come back  for follow-up visit in 3 weeks for evaluation before starting cycle #18. The patient voices understanding of current disease status and treatment options and is in agreement with  the current care plan. All questions were answered. The patient knows to call the clinic with any problems, questions or concerns. We can certainly see the patient much sooner if necessary.  Disclaimer: This note was dictated with voice recognition software. Similar sounding words can inadvertently be transcribed and may not be corrected upon review.

## 2016-12-03 NOTE — Telephone Encounter (Signed)
Patient already on schedule for lab/fu/tx q3w thru November as requested per 10/8 los.

## 2016-12-03 NOTE — Progress Notes (Signed)
Per progress note by Dr. [name] today, ok to treat with Hgb of 7.5.

## 2016-12-07 DIAGNOSIS — D509 Iron deficiency anemia, unspecified: Secondary | ICD-10-CM

## 2016-12-07 DIAGNOSIS — C3431 Malignant neoplasm of lower lobe, right bronchus or lung: Secondary | ICD-10-CM | POA: Diagnosis not present

## 2016-12-07 LAB — FECAL OCCULT BLOOD, GUAIAC: OCCULT BLOOD: POSITIVE

## 2016-12-10 ENCOUNTER — Encounter: Payer: Self-pay | Admitting: Internal Medicine

## 2016-12-10 ENCOUNTER — Telehealth: Payer: Self-pay | Admitting: Medical Oncology

## 2016-12-10 ENCOUNTER — Other Ambulatory Visit: Payer: Self-pay | Admitting: Internal Medicine

## 2016-12-10 DIAGNOSIS — D500 Iron deficiency anemia secondary to blood loss (chronic): Secondary | ICD-10-CM

## 2016-12-10 DIAGNOSIS — C3491 Malignant neoplasm of unspecified part of right bronchus or lung: Secondary | ICD-10-CM

## 2016-12-10 HISTORY — DX: Iron deficiency anemia secondary to blood loss (chronic): D50.0

## 2016-12-10 NOTE — Telephone Encounter (Signed)
Per [name] "She needs referral to GI. I will place the order. Please let her know. " I left this message on pt voicemail.

## 2016-12-10 NOTE — Telephone Encounter (Signed)
done

## 2016-12-13 ENCOUNTER — Other Ambulatory Visit: Payer: Self-pay

## 2016-12-13 ENCOUNTER — Encounter: Payer: Self-pay | Admitting: Physician Assistant

## 2016-12-13 ENCOUNTER — Encounter (INDEPENDENT_AMBULATORY_CARE_PROVIDER_SITE_OTHER): Payer: Self-pay

## 2016-12-13 ENCOUNTER — Ambulatory Visit (INDEPENDENT_AMBULATORY_CARE_PROVIDER_SITE_OTHER): Payer: Medicare Other | Admitting: Physician Assistant

## 2016-12-13 VITALS — BP 130/50 | HR 84 | Ht 62.75 in | Wt 99.2 lb

## 2016-12-13 DIAGNOSIS — R195 Other fecal abnormalities: Secondary | ICD-10-CM | POA: Diagnosis not present

## 2016-12-13 DIAGNOSIS — D500 Iron deficiency anemia secondary to blood loss (chronic): Secondary | ICD-10-CM | POA: Diagnosis not present

## 2016-12-13 DIAGNOSIS — D649 Anemia, unspecified: Secondary | ICD-10-CM

## 2016-12-13 DIAGNOSIS — Q2730 Arteriovenous malformation, site unspecified: Secondary | ICD-10-CM

## 2016-12-13 MED ORDER — FERUMOXYTOL INJECTION 510 MG/17 ML: 510.0000 mg | INTRAVENOUS | Status: DC

## 2016-12-13 MED ORDER — SODIUM CHLORIDE 0.9 % IV SOLN: Freq: Once | INTRAVENOUS | Status: DC

## 2016-12-13 NOTE — Progress Notes (Signed)
Chief Complaint: IDA  HPI:  Mrs. Finerty is a 66 year old Caucasian female with a past medical history including adenocarcinoma of the right lung with bone metastases diagnosed in September 2017, who was referred to me by Curt Bears, MD for a complaint of iron deficiency anemia .     According to her referring physician's notes patient was treated with immunotherapy, she presented to clinic 12/03/16 with complaint of increasing fatigue. She continued to tolerate her treatment with [facility] Math fairly well. Patient had recently repeated CT chest and abdomen on 11/30/2016. This showed a stable necrotic node in the right hilum, mild contraction of the pleural-based nodule, consolidation in the right lower lobe, extensive centrilobular emphysema, no evidence of lung cancer progression. CT abdomen revealed no evidence of metastatic disease in the abdomen, no skeletal metastasis, aortic atherosclerosis and emphysema. She was found to have iron deficiency anemia and it was recommend the patient start taking oral iron tabs on a regular basis. Her stools were also checked for blood, this returned positive 3 on 12/07/16. Labs on 12/03/16 showed a CBC with hemoglobin about 7.5 compared to a month ago at 9 and 2 months ago at 10.1, MCV was low.   Patient had recent colonoscopy 12/08/15 by Dr. [name] for an abnormal PET scan of the abdomen. There was finding of one 25 mm polyp in the sigmoid colon which was removed as well as seven 5-15 mm polyps in the rectum, transverse colon and cecum, diverticulosis in the left colon, multiple AVMs and otherwise normal exam. Repeat was recommended in 3 years.   Today, the patient presents to clinic and tells me that she has occasional heartburn symptoms for which she uses mustard about once a month. She denies any abdominal pain. She does have occasional constipated stools but uses a stool softener with relief. She has not had trouble with this for about a month. Patient describes she  has not seen any bright red blood or black tarry sticky stools. She has been fatigued lately and had shortness of breath ever since her diagnosis of lung cancer.   Patient denies fever, chills, anorexia, nausea, vomiting, dysphagia or symptoms that awaken her at night.  Past Medical History:  Diagnosis Date  . Adenocarcinoma of right lung, stage 4 ([place]) 11/30/2015  . Complication of anesthesia    hard to wake up  . Eczema   . Encounter for antineoplastic chemotherapy 11/30/2015  . Heart murmur    told at age 29 but never has had any problems  . History of radiation therapy 01/26/16-02/07/16   right pelvis 30 Gy in 10 fractions  . HTN (hypertension) 02/01/2016  . 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 Iron deficiency anemia due to chronic blood loss 12/10/2016    Past Surgical History:  Procedure Laterality Date  . ABDOMINAL HYSTERECTOMY    . OOPHORECTOMY  1996   tumor removed from right ovary  . TONSILLECTOMY    . VIDEO BRONCHOSCOPY WITH ENDOBRONCHIAL NAVIGATION N/A 11/18/2015   Procedure: VIDEO BRONCHOSCOPY WITH ENDOBRONCHIAL NAVIGATION;  Surgeon: Melrose Nakayama, MD;  Location: [facility];  Service: Thoracic;  Laterality: N/A;  . VIDEO BRONCHOSCOPY WITH ENDOBRONCHIAL ULTRASOUND N/A 11/18/2015   Procedure: VIDEO BRONCHOSCOPY WITH ENDOBRONCHIAL ULTRASOUND;  Surgeon: Melrose Nakayama, MD;  Location: [facility];  Service: Thoracic;  Laterality: N/A;    Current Outpatient Prescriptions  Medication Sig Dispense Refill  . aspirin 325 MG tablet Take 325 mg by mouth daily.    . cetirizine (ZYRTEC) 10 MG tablet Take  1 tablet (10 mg total) by mouth daily. (Patient not taking: Reported on 11/14/2016) 30 tablet 0  . hydrocortisone cream 0.5 % Apply 1 application topically 2 (two) times daily. 30 g 2  . levothyroxine (SYNTHROID, LEVOTHROID) 100 MCG tablet TAKE 1 TABLET(100 MCG) BY MOUTH DAILY BEFORE BREAKFAST 30 tablet 0  . meloxicam (MOBIC) 7.5 MG tablet TAKE 1 TABLET(7.5 MG) BY MOUTH DAILY 90  tablet 0  . Multiple Vitamin (MULTIVITAMIN) tablet Take 1 tablet by mouth daily.     No current facility-administered medications for this visit.     Allergies as of 12/13/2016 - Review Complete 12/03/2016  Allergen Reaction Noted  . Penicillins Swelling 10/10/2015    Family History  Problem Relation Age of Onset  . Colon cancer Neg Hx   . Esophageal cancer Neg Hx   . Rectal cancer Neg Hx   . Stomach cancer Neg Hx     Social History   Social History  . Marital status: Single    Spouse name: N/A  . Number of children: N/A  . Years of education: N/A   Occupational History  . retired - childcare    Social History Main Topics  . Smoking status: Former Smoker    Packs/day: 0.50    Years: 30.00    Quit date: 11/01/2015  . Smokeless tobacco: Never Used  . Alcohol use No  . Drug use: No  . Sexual activity: Not Currently    Birth control/ protection: Surgical   Other Topics Concern  . Not on file   Social History Narrative  . No narrative on file    Review of Systems:    Constitutional: No fever or chills Cardiovascular: No chest pain Respiratory: No cough Gastrointestinal: See HPI and otherwise negative   Physical Exam:  Vital signs: BP (!) 130/50 (BP Location: Left Arm, Patient Position: Sitting, Cuff Size: Normal)   Pulse 84   Ht 5' 2.75" (1.594 m) Comment: height measured without shoes  Wt 99 lb 4 oz (45 kg)   BMI 17.72 kg/m    Constitutional:   Pleasant thin appearing AA female appears to be in NAD, Well developed, Well nourished, alert and cooperative Head:  Normocephalic and atraumatic. Eyes:   PEERL, EOMI. No icterus. Conjunctiva pink. Ears:  Normal auditory acuity. Neck:  Supple Throat: Oral cavity and pharynx without inflammation, swelling or lesion.  Respiratory: Respirations even and unlabored. Lungs clear to auscultation bilaterally.   No wheezes, crackles, or rhonchi.  Cardiovascular: Normal S1, S2. No MRG. Regular rate and rhythm. No  peripheral edema, cyanosis or pallor.  Gastrointestinal:  Soft, nondistended, nontender. No rebound or guarding. Normal bowel sounds. No appreciable masses or hepatomegaly. Rectal:  Not performed.  Msk:  Symmetrical without gross deformities. Without edema, no deformity or joint abnormality.  Neurologic:  Alert and  oriented x4;  grossly normal neurologically.  Skin:   Dry and intact without significant lesions or rashes. Psychiatric: Demonstrates good judgement and reason without abnormal affect or behaviors.  MOST RECENT LABS AND IMAGING: CBC    Component Value Date/Time   WBC 8.9 12/03/2016 1204   WBC 9.0 11/15/2015 1512   RBC 3.69 (L) 12/03/2016 1204   RBC 4.63 11/15/2015 1512   HGB 7.5 (L) 12/03/2016 1204   HCT 24.2 (L) 12/03/2016 1204   PLT 376 12/03/2016 1204   MCV 65.6 (L) 12/03/2016 1204   MCH 20.3 (L) 12/03/2016 1204   MCH 28.3 11/15/2015 1512   MCHC 31.0 (L) 12/03/2016 1204  MCHC 34.4 11/15/2015 1512   RDW 17.0 (H) 12/03/2016 1204   LYMPHSABS 1.9 12/03/2016 1204   MONOABS 0.6 12/03/2016 1204   EOSABS 0.3 12/03/2016 1204   BASOSABS 0.0 12/03/2016 1204    CMP     Component Value Date/Time   NA 139 12/03/2016 1204   K 4.5 12/03/2016 1204   CL 103 11/15/2015 1512   CO2 24 12/03/2016 1204   GLUCOSE 120 12/03/2016 1204   BUN 11.0 12/03/2016 1204   CREATININE 1.1 12/03/2016 1204   CALCIUM 9.6 12/03/2016 1204   PROT 8.3 12/03/2016 1204   ALBUMIN 3.6 12/03/2016 1204   AST 30 12/03/2016 1204   ALT 19 12/03/2016 1204   ALKPHOS 206 (H) 12/03/2016 1204   BILITOT 0.33 12/03/2016 1204   GFRNONAA 44 (L) 11/15/2015 1512   GFRAA 52 (L) 11/15/2015 1512    Assessment: 1. IDA: Patient with recent drop in hemoglobin from 9 down to 7.5 in the past month, Hemoccult positive stools 3, history of colonoscopy October last year with multiple AVMs, discussed case with Dr. [name] it is believed these AVMs are the source of iron deficiency anemia and Hemoccult-positive stools 2.  Hemoccult + stools: see above  Plan: 1. Dr. [name] recommends aggressive iron replacement and a blood transfusion 2. Ordered 1 unit PRBC transfusion for the patient. Also ordered for heme infusion, patient will need repeat scheduled after the first is completed 3. Will share this information with Dr. Rogue Jury. Patient will need to be monitored going forward with infusion/transfusion as needed. If the patient has any overt signs of GI bleeding in the future, please let us know and we would be happy to see her back 4. Patient to follow with [place] as needed in the future  Ellouise Newer, PA-C [facility] 12/13/2016, 1:21 PM  Cc: Curt Bears, MD

## 2016-12-13 NOTE — Progress Notes (Signed)
Case discussed with GI physician assistant. Patient advanced lung cancer. Here for anemia and heme-positive stool. This is almost certainly secondary to multiple right-sided AVMs in the large intestine. Recommend transfuse as needed and chronic iron therapy indefinitely. Dr. [name] can treat and monitor her blood counts in this regard.

## 2016-12-13 NOTE — Patient Instructions (Signed)
We have scheduled you for a blood transfusion and a iron infusion on 12/17/16  At 8:00 am. The address is [address] [place], [place] [address]

## 2016-12-14 ENCOUNTER — Other Ambulatory Visit: Payer: Self-pay | Admitting: Internal Medicine

## 2016-12-17 ENCOUNTER — Ambulatory Visit (HOSPITAL_COMMUNITY)
Admission: RE | Admit: 2016-12-17 | Discharge: 2016-12-17 | Disposition: A | Discharge status: Home / Self Care | Payer: Medicare Other | Source: Ambulatory Visit | Attending: Internal Medicine | Admitting: Internal Medicine

## 2016-12-17 DIAGNOSIS — Q2730 Arteriovenous malformation, site unspecified: Secondary | ICD-10-CM

## 2016-12-17 DIAGNOSIS — D649 Anemia, unspecified: Secondary | ICD-10-CM | POA: Diagnosis not present

## 2016-12-17 LAB — ABO/RH: ABO/RH(D): O POS

## 2016-12-17 LAB — PREPARE RBC (CROSSMATCH)

## 2016-12-17 MED ORDER — SODIUM CHLORIDE 0.9 % IV SOLN
Freq: Once | INTRAVENOUS | Status: AC
  Administered 2016-12-17: 11:00:00 via INTRAVENOUS

## 2016-12-17 MED ORDER — SODIUM CHLORIDE 0.9 % IV SOLN
510.0000 mg | INTRAVENOUS | Status: DC
  Administered 2016-12-17: 510 mg via INTRAVENOUS
  Filled 2016-12-17: qty 17

## 2016-12-17 NOTE — Discharge Instructions (Signed)
Ferumoxytol injection What is this medicine? FERUMOXYTOL is an iron complex. Iron is used to make healthy red blood cells, which carry oxygen and nutrients throughout the body. This medicine is used to treat iron deficiency anemia in people with chronic kidney disease. This medicine may be used for other purposes; ask your health care provider or pharmacist if you have questions. COMMON BRAND NAME(S): Feraheme What should I tell my health care provider before I take this medicine? They need to know if you have any of these conditions: -anemia not caused by low iron levels -high levels of iron in the blood -magnetic resonance imaging (MRI) test scheduled -an unusual or allergic reaction to iron, other medicines, foods, dyes, or preservatives -pregnant or trying to get pregnant -breast-feeding How should I use this medicine? This medicine is for injection into a vein. It is given by a health care professional in a hospital or clinic setting. Talk to your pediatrician regarding the use of this medicine in children. Special care may be needed. Overdosage: If you think you have taken too much of this medicine contact a poison control center or emergency room at once. NOTE: This medicine is only for you. Do not share this medicine with others. What if I miss a dose? It is important not to miss your dose. Call your doctor or health care professional if you are unable to keep an appointment. What may interact with this medicine? This medicine may interact with the following medications: -other iron products This list may not describe all possible interactions. Give your health care provider a list of all the medicines, herbs, non-prescription drugs, or dietary supplements you use. Also tell them if you smoke, drink alcohol, or use illegal drugs. Some items may interact with your medicine. What should I watch for while using this medicine? Visit your doctor or healthcare professional regularly. Tell  your doctor or healthcare professional if your symptoms do not start to get better or if they get worse. You may need blood work done while you are taking this medicine. You may need to follow a special diet. Talk to your doctor. Foods that contain iron include: whole grains/cereals, dried fruits, beans, or peas, leafy green vegetables, and organ meats (liver, kidney). What side effects may I notice from receiving this medicine? Side effects that you should report to your doctor or health care professional as soon as possible: -allergic reactions like skin rash, itching or hives, swelling of the face, lips, or tongue -breathing problems -changes in blood pressure -feeling faint or lightheaded, falls -fever or chills -flushing, sweating, or hot feelings -swelling of the ankles or feet Side effects that usually do not require medical attention (report to your doctor or health care professional if they continue or are bothersome): -diarrhea -headache -nausea, vomiting -stomach pain This list may not describe all possible side effects. Call your doctor for medical advice about side effects. You may report side effects to FDA at 1-800-FDA-1088. Where should I keep my medicine? This drug is given in a hospital or clinic and will not be stored at home. NOTE: This sheet is a summary. It may not cover all possible information. If you have questions about this medicine, talk to your doctor, pharmacist, or health care provider.  2018 Elsevier/Gold Standard (2015-03-17 12:41:49) Blood Transfusion, Care After This sheet gives you information about how to care for yourself after your procedure. Your doctor may also give you more specific instructions. If you have problems or questions, contact your doctor.  Follow these instructions at home:  Take over-the-counter and prescription medicines only as told by your doctor.  Go back to your normal activities as told by your doctor.  Follow instructions  from your doctor about how to take care of the area where an IV tube was put into your vein (insertion site). Make sure you: ? Wash your hands with soap and water before you change your bandage (dressing). If there is no soap and water, use hand sanitizer. ? Change your bandage as told by your doctor.  Check your IV insertion site every day for signs of infection. Check for: ? More redness, swelling, or pain. ? More fluid or blood. ? Warmth. ? Pus or a bad smell. Contact a doctor if:  You have more redness, swelling, or pain around the IV insertion site..  You have more fluid or blood coming from the IV insertion site.  Your IV insertion site feels warm to the touch.  You have pus or a bad smell coming from the IV insertion site.  Your pee (urine) turns pink, red, or brown.  You feel weak after doing your normal activities. Get help right away if:  You have signs of a serious allergic or body defense (immune) system reaction, including: ? Itchiness. ? Hives. ? Trouble breathing. ? Anxiety. ? Pain in your chest or lower back. ? Fever, flushing, and chills. ? Fast pulse. ? Rash. ? Watery poop (diarrhea). ? Throwing up (vomiting). ? Dark pee. ? Serious headache. ? Dizziness. ? Stiff neck. ? Yellow color in your face or the white parts of your eyes (jaundice). Summary  After a blood transfusion, return to your normal activities as told by your doctor.  Every day, check for signs of infection where the IV tube was put into your vein.  Some signs of infection are warm skin, more redness and pain, more fluid or blood, and pus or a bad smell where the needle went in.  Contact your doctor if you feel weak or have any unusual symptoms. This information is not intended to replace advice given to you by your health care provider. Make sure you discuss any questions you have with your health care provider. Document Released: 03/05/2014 Document Revised: 10/07/2015 Document  Reviewed: 10/07/2015 Elsevier Interactive Patient Education  2017 [employer].

## 2016-12-17 NOTE — Progress Notes (Signed)
Provider: Curt Bears MD  Diagnosis Association: Anemia, unspecified type (D64.9); AVM (arteriovenous malformation) (Q27.30)  Treatment: 1 unit of PRBC's and ferumoxytol (FERAHEME) 510 mg in sodium chloride 0.9 % 100 mL IVPB  Patient tolerated procedure well with no transfusion reaction. Discharge instructions given to patient and patient states an understanding. Patient alert, oriented, and ambulatory at time of discharge.

## 2016-12-18 LAB — TYPE AND SCREEN
ABO/RH(D): O POS
Antibody Screen: NEGATIVE
Unit division: 0

## 2016-12-18 LAB — BPAM RBC
Blood Product Expiration Date: 201811202359
ISSUE DATE / TIME: 201810221019
Unit Type and Rh: 5100

## 2016-12-24 ENCOUNTER — Ambulatory Visit (HOSPITAL_BASED_OUTPATIENT_CLINIC_OR_DEPARTMENT_OTHER): Payer: Medicare Other | Admitting: Internal Medicine

## 2016-12-24 ENCOUNTER — Ambulatory Visit (HOSPITAL_BASED_OUTPATIENT_CLINIC_OR_DEPARTMENT_OTHER): Payer: Medicare Other

## 2016-12-24 ENCOUNTER — Other Ambulatory Visit (HOSPITAL_BASED_OUTPATIENT_CLINIC_OR_DEPARTMENT_OTHER): Payer: Medicare Other

## 2016-12-24 ENCOUNTER — Encounter: Payer: Self-pay | Admitting: *Deleted

## 2016-12-24 ENCOUNTER — Other Ambulatory Visit: Payer: Self-pay | Admitting: Internal Medicine

## 2016-12-24 ENCOUNTER — Telehealth: Payer: Self-pay | Admitting: Internal Medicine

## 2016-12-24 ENCOUNTER — Other Ambulatory Visit: Payer: Self-pay | Admitting: Medical Oncology

## 2016-12-24 ENCOUNTER — Encounter: Payer: Self-pay | Admitting: Internal Medicine

## 2016-12-24 VITALS — BP 134/54 | HR 87 | Temp 98.4°F | Resp 18 | Ht 62.75 in | Wt 101.2 lb

## 2016-12-24 DIAGNOSIS — C3491 Malignant neoplasm of unspecified part of right bronchus or lung: Secondary | ICD-10-CM

## 2016-12-24 DIAGNOSIS — C3431 Malignant neoplasm of lower lobe, right bronchus or lung: Secondary | ICD-10-CM

## 2016-12-24 DIAGNOSIS — Z5112 Encounter for antineoplastic immunotherapy: Secondary | ICD-10-CM

## 2016-12-24 DIAGNOSIS — D649 Anemia, unspecified: Secondary | ICD-10-CM | POA: Diagnosis not present

## 2016-12-24 DIAGNOSIS — C7951 Secondary malignant neoplasm of bone: Secondary | ICD-10-CM | POA: Diagnosis not present

## 2016-12-24 DIAGNOSIS — Z79899 Other long term (current) drug therapy: Secondary | ICD-10-CM

## 2016-12-24 DIAGNOSIS — R5382 Chronic fatigue, unspecified: Secondary | ICD-10-CM | POA: Insufficient documentation

## 2016-12-24 LAB — CBC WITH DIFFERENTIAL/PLATELET
BASO%: 0.3 % (ref 0.0–2.0)
Basophils Absolute: 0 10*3/uL (ref 0.0–0.1)
EOS ABS: 0.4 10*3/uL (ref 0.0–0.5)
EOS%: 5.6 % (ref 0.0–7.0)
HCT: 34.2 % — ABNORMAL LOW (ref 34.8–46.6)
HGB: 10.9 g/dL — ABNORMAL LOW (ref 11.6–15.9)
LYMPH%: 22 % (ref 14.0–49.7)
MCH: 22.5 pg — ABNORMAL LOW (ref 25.1–34.0)
MCHC: 31.9 g/dL (ref 31.5–36.0)
MCV: 70.7 fL — AB (ref 79.5–101.0)
MONO#: 0.4 10*3/uL (ref 0.1–0.9)
MONO%: 5.3 % (ref 0.0–14.0)
NEUT%: 66.8 % (ref 38.4–76.8)
NEUTROS ABS: 4.6 10*3/uL (ref 1.5–6.5)
PLATELETS: 336 10*3/uL (ref 145–400)
RBC: 4.84 10*6/uL (ref 3.70–5.45)
RDW: 25.6 % — ABNORMAL HIGH (ref 11.2–14.5)
WBC: 6.8 10*3/uL (ref 3.9–10.3)
lymph#: 1.5 10*3/uL (ref 0.9–3.3)
nRBC: 0 % (ref 0–0)

## 2016-12-24 LAB — COMPREHENSIVE METABOLIC PANEL
ALK PHOS: 231 U/L — AB (ref 40–150)
ALT: 33 U/L (ref 0–55)
ANION GAP: 10 meq/L (ref 3–11)
AST: 46 U/L — AB (ref 5–34)
Albumin: 3.9 g/dL (ref 3.5–5.0)
BUN: 12.8 mg/dL (ref 7.0–26.0)
CALCIUM: 9.7 mg/dL (ref 8.4–10.4)
CHLORIDE: 104 meq/L (ref 98–109)
CO2: 24 meq/L (ref 22–29)
Creatinine: 1 mg/dL (ref 0.6–1.1)
EGFR: >60 mL/min/{1.73_m2} (ref >60)
GLUCOSE: 96 mg/dL (ref 70–140)
Potassium: 4.4 mEq/L (ref 3.5–5.1)
Sodium: 138 mEq/L (ref 136–145)
TOTAL PROTEIN: 8.6 g/dL — AB (ref 6.4–8.3)
Total Bilirubin: 0.31 mg/dL (ref 0.20–1.20)

## 2016-12-24 LAB — TSH: TSH: 0.443 m[IU]/L (ref 0.308–3.960)

## 2016-12-24 LAB — TECHNOLOGIST REVIEW

## 2016-12-24 MED ORDER — SODIUM CHLORIDE 0.9 % IV SOLN
200.0000 mg | Freq: Once | INTRAVENOUS | Status: AC
  Administered 2016-12-24: 200 mg via INTRAVENOUS
  Filled 2016-12-24: qty 8

## 2016-12-24 MED ORDER — SODIUM CHLORIDE 0.9 % IV SOLN
Freq: Once | INTRAVENOUS | Status: AC
  Administered 2016-12-24: 14:00:00 via INTRAVENOUS

## 2016-12-24 NOTE — Patient Instructions (Signed)
[facility] Discharge Instructions for Patients Receiving Chemotherapy  Today you received the following chemotherapy agents:  Keytruda.  To help prevent nausea and vomiting after your treatment, we encourage you to take your nausea medication as directed.   If you develop nausea and vomiting that is not controlled by your nausea medication, call the clinic.   BELOW ARE SYMPTOMS THAT SHOULD BE REPORTED IMMEDIATELY:  *FEVER GREATER THAN 100.5 F  *CHILLS WITH OR WITHOUT FEVER  NAUSEA AND VOMITING THAT IS NOT CONTROLLED WITH YOUR NAUSEA MEDICATION  *UNUSUAL SHORTNESS OF BREATH  *UNUSUAL BRUISING OR BLEEDING  TENDERNESS IN MOUTH AND THROAT WITH OR WITHOUT PRESENCE OF ULCERS  *URINARY PROBLEMS  *BOWEL PROBLEMS  UNUSUAL RASH Items with * indicate a potential emergency and should be followed up as soon as possible.  Feel free to call the clinic should you have any questions or concerns. The clinic phone number is [phone].  Please show the CHEMO ALERT CARD at check-in to the Emergency Department and triage nurse.    

## 2016-12-24 NOTE — Telephone Encounter (Signed)
Gave patient avs report and appointments for November and December

## 2016-12-24 NOTE — Progress Notes (Signed)
[place] [phone]   [phone]  OFFICE PROGRESS NOTE  [name], DO [address] [address]  DIAGNOSIS: Stage IV (T1b, N2, M1 B) non-small cell lung cancer, adenocarcinoma with positive PDL 1 expression (99%), presented with right lower lobe lung nodule with metastatic right hilar and subcarinal lymphadenopathy as well as bone metastasis diagnosed in September 2017.  Genomic Alterations Identified? NF1 G351f*9 ARID1B [id number] PBRM1 [id number] TP53 [id number] Additional Findings? Microsatellite status MS-Stable Tumor Mutation Burden TMB-Intermediate; 7 Muts/Mb Additional Disease-relevant Genes with No Reportable Alterations Identified? EGFR KRAS ALK BRAF MET ERBB2 RET ROS1  PRIOR THERAPY: Palliative radiotherapy to the metastatic bone lesion under the care of Dr. KinaSondra ComeURRENT THERAPY: First-line treatment with immunotherapy with Ketruda (pembrolizumab) 200 mg IV every 3 weeks status post 17 cycles. First cycle was given 12/21/2015.  INTERVAL HISTORY: Brittany Boomy49. female returns to the clinic today for follow-up visit accompanied by her sister. The patient is feeling fine today with no specific complaints She continues to tolerate her treatment with KetrNat Mathmbrolizumab) fairly well. She denied having any chest pain, shortness breath, cough or hemoptysis. She denied having any fever or chills. She has no nausea, vomiting, diarrhea or constipation. She denied having any recent weight loss or night sweats. She is here today for evaluation before starting cycle #18 of her treatment.   MEDICAL HISTORY: Past Medical History:  Diagnosis Date  . Adenocarcinoma of right lung, stage 4 (HCC)[place]/05/2015  . Complication of anesthesia    hard to wake up  . Eczema   . Encounter for antineoplastic chemotherapy 11/30/2015  . Heart murmur    told at age 49 b62 never has had any problems  .  History of radiation therapy 01/26/16-02/07/16   right pelvis 30 Gy in 10 fractions  . HTN (hypertension) 02/01/2016  . 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 Iron deficiency anemia due to chronic blood loss 12/10/2016    ALLERGIES:  is allergic to penicillins.  MEDICATIONS:  Current Outpatient Prescriptions  Medication Sig Dispense Refill  . aspirin 325 MG tablet Take 325 mg by mouth daily.    . cetirizine (ZYRTEC) 10 MG tablet Take 1 tablet (10 mg total) by mouth daily. 30 tablet 0  . hydrocortisone cream 0.5 % Apply 1 application topically 2 (two) times daily. 30 g 2  . levothyroxine (SYNTHROID, LEVOTHROID) 100 MCG tablet TAKE 1 TABLET(100 MCG) BY MOUTH DAILY BEFORE BREAKFAST 30 tablet 0  . meloxicam (MOBIC) 7.5 MG tablet TAKE 1 TABLET(7.5 MG) BY MOUTH DAILY 90 tablet 0  . Multiple Vitamin (MULTIVITAMIN) tablet Take 1 tablet by mouth daily.     Current Facility-Administered Medications  Medication Dose Route Frequency Provider Last Rate Last Dose  . 0.9 %  sodium chloride infusion   Intravenous Once LemmLevin Erp  [place]  . 0.9 %  sodium chloride infusion   Intravenous Once LemmLevin Erp  [place]    SURGICAL HISTORY:  Past Surgical History:  Procedure Laterality Date  . ABDOMINAL HYSTERECTOMY    . OOPHORECTOMY  1996   tumor removed from right ovary  . TONSILLECTOMY    . VIDEO BRONCHOSCOPY WITH ENDOBRONCHIAL NAVIGATION N/A 11/18/2015   Procedure: VIDEO BRONCHOSCOPY WITH ENDOBRONCHIAL NAVIGATION;  Surgeon: StevMelrose Nakayama;  [facility];  Laterality: N/A;  . VIDEO BRONCHOSCOPY WITH ENDOBRONCHIAL ULTRASOUND N/A 11/18/2015   Procedure:  VIDEO BRONCHOSCOPY WITH ENDOBRONCHIAL ULTRASOUND;  Surgeon: Melrose Nakayama, MD;  Location: [facility] OR;  Service: Thoracic;  Laterality: N/A;    REVIEW OF SYSTEMS:  A comprehensive review of systems was negative.   PHYSICAL EXAMINATION: General appearance: alert, cooperative and no distress Head:  Normocephalic, without obvious abnormality, atraumatic Neck: no adenopathy, no JVD, supple, symmetrical, trachea midline and thyroid not enlarged, symmetric, no tenderness/mass/nodules Lymph nodes: Cervical, supraclavicular, and axillary nodes normal. Resp: clear to auscultation bilaterally Back: symmetric, no curvature. ROM normal. No CVA tenderness. Cardio: normal apical impulse GI: soft, non-tender; bowel sounds normal; no masses,  no organomegaly Extremities: extremities normal, atraumatic, no cyanosis or edema  ECOG PERFORMANCE STATUS: 1 - Symptomatic but completely ambulatory  Blood pressure (!) 134/54, pulse 87, temperature 98.4 F (36.9 C), temperature source Oral, resp. rate 18, height 5' 2.75" (1.594 m), weight 101 lb 3.2 oz (45.9 kg).  LABORATORY DATA: Lab Results  Component Value Date   WBC 8.9 12/03/2016   HGB 7.5 (L) 12/03/2016   HCT 24.2 (L) 12/03/2016   MCV 65.6 (L) 12/03/2016   PLT 376 12/03/2016      Chemistry      Component Value Date/Time   NA 139 12/03/2016 1204   K 4.5 12/03/2016 1204   CL 103 11/15/2015 1512   CO2 24 12/03/2016 1204   BUN 11.0 12/03/2016 1204   CREATININE 1.1 12/03/2016 1204      Component Value Date/Time   CALCIUM 9.6 12/03/2016 1204   ALKPHOS 206 (H) 12/03/2016 1204   AST 30 12/03/2016 1204   ALT 19 12/03/2016 1204   BILITOT 0.33 12/03/2016 1204       RADIOGRAPHIC STUDIES: Ct Chest W Contrast  Result Date: 11/30/2016 CLINICAL DATA:  Followup lung cancer. Chemotherapy ongoing. Radiation therapy complete. EXAM: CT CHEST, ABDOMEN, AND PELVIS WITH CONTRAST TECHNIQUE: Multidetector CT imaging of the chest, abdomen and pelvis was performed following the standard protocol during bolus administration of intravenous contrast. CONTRAST:  73m ISOVUE-300 IOPAMIDOL (ISOVUE-300) INJECTION 61% COMPARISON:  CT 09/25/2016 FINDINGS: CT CHEST FINDINGS Cardiovascular: No significant vascular findings. Normal heart size. No pericardial effusion.  Mediastinum/Nodes: No axillary supraclavicular adenopathy. Enlarged necrotic RIGHT hilar lymph node measures 23 by 12 mm compared with 20 by 12 mm for no significant change. No mediastinal adenopathy. Lungs/Pleura: Nodular thickening along the posterior surface of the RIGHT lower lobe measures 18 mm in greatest dimension (image 70 3, series 4) which compares to 23 mm on prior. There is extensive centrilobular emphysema in the upper lobes. No new nodularity. Angular nodule in the superior segment LEFT lower lobe measuring 6 mm unchanged Musculoskeletal: No aggressive osseous lesion. CT ABDOMEN PELVIS FINDINGS Hepatobiliary: No focal hepatic lesion. No biliary duct dilatation. Gallbladder is normal. Common bile duct is normal. Pancreas: Pancreas is normal. No ductal dilatation. No pancreatic inflammation. Spleen: Normal spleen Adrenals/urinary tract: Thickening of the RIGHT adrenal gland is not changed. LEFT adrenal gland normal. Kidneys normal. Ureters bladder normal. Stomach/Bowel: Stomach, small bowel, appendix, and cecum are normal. The colon and rectosigmoid colon are normal. Vascular/Lymphatic: Abdominal aorta is normal caliber with atherosclerotic calcification. There is no retroperitoneal or periportal lymphadenopathy. No pelvic lymphadenopathy. Reproductive: Post hysterectomy. Other: No free fluid. Musculoskeletal: No aggressive osseous lesion. IMPRESSION: Chest Impression: 1. Stable necrotic node in the RIGHT hilum. 2. Mild contraction of pleural-based nodular consolidation in the RIGHT lower lobe. 3. Extensive centrilobular emphysema. 4. No evidence of lung cancer progression Abdomen / Pelvis Impression: 1. No evidence of metastatic disease in  the abdomen pelvis. 2. No skeletal metastasis. 3. Aortic Atherosclerosis (ICD10-I70.0) and Emphysema (ICD10-J43.9). Electronically Signed   By: Suzy Bouchard M.D.   On: 11/30/2016 14:08   Ct Abdomen Pelvis W Contrast  Result Date: 11/30/2016 CLINICAL DATA:   Followup lung cancer. Chemotherapy ongoing. Radiation therapy complete. EXAM: CT CHEST, ABDOMEN, AND PELVIS WITH CONTRAST TECHNIQUE: Multidetector CT imaging of the chest, abdomen and pelvis was performed following the standard protocol during bolus administration of intravenous contrast. CONTRAST:  80m ISOVUE-300 IOPAMIDOL (ISOVUE-300) INJECTION 61% COMPARISON:  CT 09/25/2016 FINDINGS: CT CHEST FINDINGS Cardiovascular: No significant vascular findings. Normal heart size. No pericardial effusion. Mediastinum/Nodes: No axillary supraclavicular adenopathy. Enlarged necrotic RIGHT hilar lymph node measures 23 by 12 mm compared with 20 by 12 mm for no significant change. No mediastinal adenopathy. Lungs/Pleura: Nodular thickening along the posterior surface of the RIGHT lower lobe measures 18 mm in greatest dimension (image 70 3, series 4) which compares to 23 mm on prior. There is extensive centrilobular emphysema in the upper lobes. No new nodularity. Angular nodule in the superior segment LEFT lower lobe measuring 6 mm unchanged Musculoskeletal: No aggressive osseous lesion. CT ABDOMEN PELVIS FINDINGS Hepatobiliary: No focal hepatic lesion. No biliary duct dilatation. Gallbladder is normal. Common bile duct is normal. Pancreas: Pancreas is normal. No ductal dilatation. No pancreatic inflammation. Spleen: Normal spleen Adrenals/urinary tract: Thickening of the RIGHT adrenal gland is not changed. LEFT adrenal gland normal. Kidneys normal. Ureters bladder normal. Stomach/Bowel: Stomach, small bowel, appendix, and cecum are normal. The colon and rectosigmoid colon are normal. Vascular/Lymphatic: Abdominal aorta is normal caliber with atherosclerotic calcification. There is no retroperitoneal or periportal lymphadenopathy. No pelvic lymphadenopathy. Reproductive: Post hysterectomy. Other: No free fluid. Musculoskeletal: No aggressive osseous lesion. IMPRESSION: Chest Impression: 1. Stable necrotic node in the RIGHT  hilum. 2. Mild contraction of pleural-based nodular consolidation in the RIGHT lower lobe. 3. Extensive centrilobular emphysema. 4. No evidence of lung cancer progression Abdomen / Pelvis Impression: 1. No evidence of metastatic disease in the abdomen pelvis. 2. No skeletal metastasis. 3. Aortic Atherosclerosis (ICD10-I70.0) and Emphysema (ICD10-J43.9). Electronically Signed   By: SSuzy BouchardM.D.   On: 11/30/2016 14:08    ASSESSMENT AND PLAN:  This is a very pleasant 66years old African-American female with metastatic non-small cell lung cancer, adenocarcinoma with positive PDL 1 expression of 99%. She is currently undergoing treatment with Ketruda 200 mg IV every 3 weeks, status post 17 cycles. The patient tolerated the last cycle of her treatment fairly well. I recommended for her to proceed with cycle #18 today as scheduled. For the anemia, she has positive Hemoccult stool. I referred the patient to gastroenterology for evaluation. I will see her back for follow-up visit in 3 weeks for evaluation before starting cycle #19. She was advised to call immediately if she has any concerning symptoms in the interval.  The patient voices understanding of current disease status and treatment options and is in agreement with the current care plan. All questions were answered. The patient knows to call the clinic with any problems, questions or concerns. We can certainly see the patient much sooner if necessary.  Disclaimer: This note was dictated with voice recognition software. Similar sounding words can inadvertently be transcribed and may not be corrected upon review.

## 2016-12-26 ENCOUNTER — Encounter: Payer: Self-pay | Admitting: Family Medicine

## 2016-12-26 ENCOUNTER — Ambulatory Visit (INDEPENDENT_AMBULATORY_CARE_PROVIDER_SITE_OTHER): Payer: Medicare Other | Admitting: Family Medicine

## 2016-12-26 ENCOUNTER — Ambulatory Visit: Payer: Medicare Other | Admitting: Family Medicine

## 2016-12-26 VITALS — BP 130/72 | HR 85 | Temp 97.8°F | Ht 63.0 in | Wt 101.0 lb

## 2016-12-26 DIAGNOSIS — E039 Hypothyroidism, unspecified: Secondary | ICD-10-CM

## 2016-12-26 DIAGNOSIS — R5381 Other malaise: Secondary | ICD-10-CM | POA: Diagnosis not present

## 2016-12-26 LAB — T4, FREE: FREE T4: 1.32 ng/dL (ref 0.60–1.60)

## 2016-12-26 NOTE — Patient Instructions (Signed)
If you do not hear anything about your referral in the next 1-2 weeks, call our office and ask for an update.  I will take over managing your thyroid medicine.  Let us know if you need anything.

## 2016-12-26 NOTE — Progress Notes (Signed)
Pre visit review using our clinic review tool, if applicable. No additional management support is needed unless otherwise documented below in the visit note. 

## 2016-12-26 NOTE — Progress Notes (Signed)
Chief Complaint  Patient presents with  . Follow-up    4 week followup    Subjective: Patient is a 66 y.o. female here for f/u thyroid.  Her oncologist had checked her TSH which was decreased.  He started Synthroid around 10 days ago.  I had wanted to wait a full 6 weeks before checking his free T4 and repeat TSH.  I will check a free T4 today.  She is not having symptoms and is tolerating the new medicine well.  She is requesting a handicap placard.  Over the past year, she has been having weakness in both of her lower extremities.  She does have a history of stage IV lung cancer.  She tries to walk, however is very limited.  She is not having pain.  She has never had physical therapy.  She is interested in this.   ROS: MSK: +Weakness of b/l extremities Neuro: No numbness or tingling Endo: No temp intolerance  Family History  Problem Relation Age of Onset  . Colon cancer Neg Hx   . Esophageal cancer Neg Hx   . Rectal cancer Neg Hx   . Stomach cancer Neg Hx    Past Medical History:  Diagnosis Date  . Adenocarcinoma of right lung, stage 4 ([place]) 11/30/2015  . Complication of anesthesia    hard to wake up  . Eczema   . Encounter for antineoplastic chemotherapy 11/30/2015  . Heart murmur    told at age 63 but never has had any problems  . History of radiation therapy 01/26/16-02/07/16   right pelvis 30 Gy in 10 fractions  . HTN (hypertension) 02/01/2016  . 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 Iron deficiency anemia due to chronic blood loss 12/10/2016   Allergies  Allergen Reactions  . Penicillins Swelling    SWELLING REACTION UNSPECIFIED  Has patient had a PCN reaction causing immediate rash, facial/tongue/throat swelling, SOB or lightheadedness with hypotension: Yes Has patient had a PCN reaction causing severe rash involving mucus membranes or skin necrosis: No Has patient had a PCN reaction that required hospitalization No Has patient had a PCN reaction occurring  within the last 10 years: No If all of the above answers are "NO", then may proceed with Cephalosporin use.    Current Outpatient Prescriptions:  .  aspirin 325 MG tablet, Take 325 mg by mouth daily., Disp: , Rfl:  .  cetirizine (ZYRTEC) 10 MG tablet, Take 1 tablet (10 mg total) by mouth daily., Disp: 30 tablet, Rfl: 0 .  hydrocortisone cream 0.5 %, Apply 1 application topically 2 (two) times daily., Disp: 30 g, Rfl: 2 .  levothyroxine (SYNTHROID, LEVOTHROID) 100 MCG tablet, TAKE 1 TABLET(100 MCG) BY MOUTH DAILY BEFORE BREAKFAST, Disp: 30 tablet, Rfl: 0 .  meloxicam (MOBIC) 7.5 MG tablet, TAKE 1 TABLET(7.5 MG) BY MOUTH DAILY, Disp: 90 tablet, Rfl: 0 .  Multiple Vitamin (MULTIVITAMIN) tablet, Take 1 tablet by mouth daily., Disp: , Rfl:   Objective: BP 130/72 (BP Location: Left Arm, Patient Position: Sitting, Cuff Size: Normal)   Pulse 85   Temp 97.8 F (36.6 C) (Oral)   Ht 5\' 3"  (1.6 m)   Wt 101 lb (45.8 kg)   SpO2 97%   BMI 17.89 kg/m  General: Awake, appears around stated age, cachectic appearing HEENT: MMM, poor dentition Heart: RRR, no LE edema Neck: Symmetric, supple, no thyromegaly or nodules appreciated Lungs: CTAB, no rales, wheezes or rhonchi. No accessory muscle use Psych: Age appropriate judgment and insight, normal affect and  mood  Assessment and Plan: Physical deconditioning - Plan: Ambulatory referral to Physical Therapy  Hypothyroidism (acquired) - Plan: T4, free  Orders as above. Encouraged activity, will initiate PT. She is also requesting a handicap placard.  Given her comorbid conditions and deconditioning, I would be happy to fill this out.  They will drop off the form.  Her son would also like the patient to have a motorized scooter.  I am more hesitant on this as I do not want her to stop walking.  The patient is also not interested in doing that. I will plan to take over managing her thyroid medicine.  I will send her oncologist a message so we are on the  same page. F/u in 6 weeks to recheck thyroid. I may tweak the dose based off of today's T4, nml TSH on 10/29.  The patient voiced understanding and agreement to the plan.  [place], DO 12/26/16  8:58 AM

## 2017-01-01 ENCOUNTER — Encounter: Payer: Self-pay | Admitting: Physical Therapy

## 2017-01-01 ENCOUNTER — Ambulatory Visit: Payer: Medicare Other | Attending: Family Medicine | Admitting: Physical Therapy

## 2017-01-01 DIAGNOSIS — M6281 Muscle weakness (generalized): Secondary | ICD-10-CM | POA: Diagnosis not present

## 2017-01-01 DIAGNOSIS — R262 Difficulty in walking, not elsewhere classified: Secondary | ICD-10-CM

## 2017-01-01 DIAGNOSIS — R2681 Unsteadiness on feet: Secondary | ICD-10-CM | POA: Diagnosis not present

## 2017-01-01 NOTE — Therapy (Signed)
[facility] [address]  [place], [place], [address] Phone: [phone]   Fax:  [phone]  Physical Therapy Evaluation  Patient Details  Name: Brittany Mercado MRN: [id number] Date of Birth: March 30, 1950 Referring Provider: Riki Sheer, DO   Encounter Date: 01/01/2017  PT End of Session - 01/01/17 0937    Visit Number  1    Number of Visits  12    Date for PT Re-Evaluation  02/12/17    Authorization Type  UHC Medicare & Medicaid    PT Start Time  [phone]    PT Stop Time  1029    PT Time Calculation (min)  52 min    Activity Tolerance  Patient tolerated treatment well    Behavior During Therapy  [facility] for tasks assessed/performed       Past Medical History:  Diagnosis Date  . Adenocarcinoma of right lung, stage 4 ([place]) 11/30/2015  . Complication of anesthesia    hard to wake up  . Eczema   . Encounter for antineoplastic chemotherapy 11/30/2015  . Heart murmur    told at age 48 but never has had any problems  . History of radiation therapy 01/26/16-02/07/16   right pelvis 30 Gy in 10 fractions  . HTN (hypertension) 02/01/2016  . 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 Iron deficiency anemia due to chronic blood loss 12/10/2016    Past Surgical History:  Procedure Laterality Date  . ABDOMINAL HYSTERECTOMY    . OOPHORECTOMY  1996   tumor removed from right ovary  . TONSILLECTOMY      There were no vitals filed for this visit.   Subjective Assessment - 01/01/17 0940    Subjective  Pt presents to PT with c/o "legs freezing up on me" limiting walking and activity tolerance. Notes intermittent  "stinging" in B LE, especially when walking for longer periods, such as shopping at [place]. Also notes hard time with walking up/down stairs. Currently receiving treatment for cancer with Keytruda q 3wks.    Limitations  Walking    How long can you walk comfortably?  <10 minutes    Patient Stated Goals  "legs stop  freezing up"    Currently in Pain?  No/denies    Pain Score  0-No pain up to 8/10 with prolonged walking   up to 8/10 with prolonged walking   Pain Location  Leg    Pain Orientation  Right;Left    Pain Descriptors / Indicators  -- "stinging"   "stinging"   Pain Radiating Towards  entires    Aggravating Factors   prolonged walking    Pain Relieving Factors  rest, sitting down    Effect of Pain on Daily Activities  limits ability to walk for long periods - difficulty shopping         [facility] PT Assessment - 01/01/17 0937      Assessment   Medical Diagnosis  Physical deconditioning    Referring Provider  Riki Sheer, DO    Onset Date/Surgical Date  -- >1 yr ago   >1 yr ago   Prior Therapy  none      Balance Screen   Has the patient fallen in the past 6 months  No    Has the patient had a decrease in activity level because of a fear of falling?   No    Is the patient reluctant to leave their home because of a fear of falling?   No  Home Environment   Living Environment  Private residence    Living Arrangements  Children;Other relatives    Available Help at Discharge  Family    Type of [place] to enter    Entrance Stairs-Number of Steps  7 going down   going down   Entrance Stairs-Rails  Right;Left;Can reach both    Home Layout  Two level lives in basement   lives in [place]  None      Prior Function   Level of [place]  Retired    Leisure  play with grandkids, spend time with sister      Observation/Other Assessments   Focus on Therapeutic Outcomes (FOTO)   Neuro - 71% (29% limitation); predicted 77% (23% limitation)      ROM / Strength   AROM / PROM / Strength  Strength      Strength   Strength Assessment Site  Hip;Knee;Ankle    Right/Left Hip  Right;Left    Right Hip Flexion  4-/5    Right Hip Extension  4-/5    Right Hip External Rotation   3+/5    Right Hip Internal Rotation   4-/5    Right Hip ABduction  4-/5    Right Hip ADduction  4-/5    Left Hip Flexion  4-/5    Left Hip Extension  3+/5    Left Hip External Rotation  3+/5    Left Hip Internal Rotation  3+/5    Left Hip ABduction  4-/5    Left Hip ADduction  3+/5    Right/Left Knee  Right;Left    Right Knee Flexion  3+/5    Right Knee Extension  4-/5    Left Knee Flexion  3+/5    Left Knee Extension  4-/5    Right/Left Ankle  Right;Left    Right Ankle Dorsiflexion  4-/5    Right Ankle Plantar Flexion  4/5    Left Ankle Dorsiflexion  4-/5    Left Ankle Plantar Flexion  4-/5      Flexibility   Soft Tissue Assessment /Muscle Length  yes    Hamstrings  mild tight B    Quadriceps  mod tight B    ITB  mild tight L>R    Piriformis  mild tight B             Objective measurements completed on examination: See above findings.      [facility] PT Treatment/Exercise - 01/01/17 0937      Exercises   Exercises  Knee/Hip      Knee/Hip Exercises: Stretches   Passive Hamstring Stretch  Both;30 seconds;1 rep    Passive Hamstring Stretch Limitations  supine with strap    Hip Flexor Stretch  Both;30 seconds;1 rep    Hip Flexor Stretch Limitations  mod thomas with strap    ITB Stretch  Both;30 seconds;1 rep    ITB Stretch Limitations  supine with strap    Piriformis Stretch  Both;30 seconds;1 rep each   each   Piriformis Stretch Limitations  figure 4 + overpressure, figure 4 to chest, KTOS      Knee/Hip Exercises: Supine   Bridges  Both;10 reps    Bridges Limitations  5" hold    Straight Leg Raises  Both;10 reps    Straight Leg Raises Limitations  3" hold      Knee/Hip Exercises: Sidelying  Clams  B clam with red TB x10             PT Education - 01/01/17 1029    Education provided  Yes    Education Details  PT eval findings, anticipated POC & initial HEP    Person(s) Educated  Patient    Methods  Explanation;Demonstration;Handout    Comprehension  Verbalized  understanding;Returned demonstration;Need further instruction       PT Short Term Goals - 01/01/17 1029      PT SHORT TERM GOAL #1   Title  Independent with initial HEP    Status  New    Target Date  01/18/17        PT Long Term Goals - 01/01/17 1029      PT LONG TERM GOAL #1   Title  Independent with ongoing HEP    Status  New    Target Date  02/15/17      PT LONG TERM GOAL #2   Title  B LE strength grossly 4/5 or greater for improved activity tolerance    Status  New    Target Date  02/15/17      PT LONG TERM GOAL #3   Title  Pt will report ability to walk through [place] or grocery store w/o limitation due to "legs freezing up" or need for seated rest break     Status  New    Target Date  02/15/17             Plan - 01/01/17 1029    Clinical Impression Statement  Brittany Mercado is a 66 y/o female who presents to OP PT for physical deconditioning resulting in difficulty walking originating > 1 yr ago around the time she was diagnosed with 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 Pt s/p palliative radiotherapy to metastatic bone lesion and currently receiving immunotherapy with Keytruda IV q 3 wks. Pt's primary c/o is a feeling of her "legs freezing up on her" when she tries to walk for longer distances, limiting community access and making things like shopping for groceries difficult. LE strength testing reveals mild to moderate B LE weakness, greater proximal than distal & L > R. Pt denies any loss of balance or instability, therefore formal balance assessment not completed today, but may be completed on future visit as indicated. Brittany Mercado demonstrates good potential to benefit from skilled PT with POC to focus on core and LE strengthening to improve lumbopelvic support and LE stability for increased activity and walking tolerance as well as balance. Balance and dynamic gait activities to be incorporated as indicated to decrease risk for falls.    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 lymphadenopathy as well as bone metastasis diagnosed in September 2017 - currently treated with immunotherapy with Keytruda IV q 3 wks 7 & s/p palliative radiotherapy to metastatic bone lesion; hypothyroidism; HTN  PT Frequency  2x / week    PT Duration  6 weeks    PT Treatment/Interventions  Patient/family education;ADLs/Self Care Home Management;Therapeutic exercise;Neuromuscular re-education;Therapeutic activities;Functional mobility training;Gait training;Stair training;Manual techniques;Dry needling;Taping;Cryotherapy    Consulted and Agree with Plan of Care  Patient       Patient will benefit from skilled therapeutic intervention in order to improve the following deficits and impairments:  Difficulty walking, Decreased activity tolerance, Decreased strength, Impaired flexibility, Impaired sensation, Increased muscle spasms, Decreased endurance  Visit Diagnosis: Muscle weakness (generalized) - Plan: PT plan of care cert/re-cert  Difficulty in walking, not elsewhere classified - Plan: PT plan of care cert/re-cert  G-Codes - 17/79/39 1027    Functional Assessment Tool Used (Outpatient Only)  Neuro FOTO = 71% (29% limitation)    Functional Limitation  Mobility: Walking and moving around    Mobility: Walking and Moving Around Current Status (Q3009)  At least 20 percent but less than 40 percent  impaired, limited or restricted    Mobility: Walking and Moving Around Goal Status (Q3300)  At least 20 percent but less than 40 percent impaired, limited or restricted        Problem List Patient Active Problem List   Diagnosis Date Noted  . Chronic fatigue 12/24/2016  . Iron deficiency anemia due to chronic blood loss 12/10/2016  . 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 HTN (hypertension) 02/01/2016  . Encounter for antineoplastic immunotherapy 12/21/2015  . Adenocarcinoma of right lung, stage 4 ([place]) 11/30/2015  . Encounter for antineoplastic chemotherapy 11/30/2015  . Tobacco abuse 11/10/2015  . Lung mass 10/11/2015    Percival Spanish, PT, MPT 01/01/2017, 2:44 PM  [facility] [address]  [place], [place], [address] Phone: [phone]   Fax:  [phone]  Name: Brittany Mercado MRN: [id number] Date of Birth: April 16, 1950

## 2017-01-03 ENCOUNTER — Other Ambulatory Visit: Payer: Self-pay | Admitting: *Deleted

## 2017-01-03 DIAGNOSIS — Z006 Encounter for examination for normal comparison and control in clinical research program: Secondary | ICD-10-CM

## 2017-01-07 ENCOUNTER — Ambulatory Visit: Payer: Medicare Other

## 2017-01-07 DIAGNOSIS — M6281 Muscle weakness (generalized): Secondary | ICD-10-CM | POA: Diagnosis not present

## 2017-01-07 DIAGNOSIS — R2681 Unsteadiness on feet: Secondary | ICD-10-CM | POA: Diagnosis not present

## 2017-01-07 DIAGNOSIS — R262 Difficulty in walking, not elsewhere classified: Secondary | ICD-10-CM

## 2017-01-07 NOTE — Therapy (Signed)
[facility] [address]  [place], [place], [address] Phone: [phone]   Fax:  [phone]  Physical Therapy Treatment  Patient Details  Name: Brittany Mercado MRN: [id number] Date of Birth: [birth year] Referring Provider: Riki Sheer, DO   Encounter Date: 01/07/2017  PT End of Session - 01/07/17 0943    Visit Number  2    Number of Visits  12    Date for PT Re-Evaluation  02/12/17    Authorization Type  UHC Medicare & Medicaid    PT Start Time  [phone]    PT Stop Time  1012    PT Time Calculation (min)  41 min    Activity Tolerance  Patient tolerated treatment well    Behavior During Therapy  [facility] for tasks assessed/performed       Past Medical History:  Diagnosis Date  . Adenocarcinoma of right lung, stage 4 ([place]) 11/30/2015  . Complication of anesthesia    hard to wake up  . Eczema   . Encounter for antineoplastic chemotherapy 11/30/2015  . Heart murmur    told at age 62 but never has had any problems  . History of radiation therapy 01/26/16-02/07/16   right pelvis 30 Gy in 10 fractions  . HTN (hypertension) 02/01/2016  . 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 Iron deficiency anemia due to chronic blood loss 12/10/2016    Past Surgical History:  Procedure Laterality Date  . ABDOMINAL HYSTERECTOMY    . OOPHORECTOMY  1996   tumor removed from right ovary  . TONSILLECTOMY      There were no vitals filed for this visit.  Subjective Assessment - 01/07/17 0938    Subjective  Brittany Mercado doing well reporting no issues with HEP.  Reports some "leg soreness" today with description sounding muscular in nature without known trigger.      Patient Stated Goals  "legs stop freezing up"    Currently in Pain?  Yes    Pain Score  8     Pain Location  Leg    Pain Orientation  Right;Left    Pain Descriptors / Indicators  Sore;Tightness    Pain Type  Acute pain    Aggravating Factors   prolonged walking    Pain  Relieving Factors  exercise, resting    Effect of Pain on Daily Activities  slows walking                       [facility] Adult PT Treatment/Exercise - 01/07/17 0946      Self-Care   Self-Care  Other Self-Care Comments    Other Self-Care Comments   Self-roller stick to B quads in seated for use at home with rolling pin       Neuro Re-ed    Neuro Re-ed Details   at counter: high knee march x 2 laps, toe-walk x 2 laps, heel-walk x 2 laps       Knee/Hip Exercises: Stretches   Hip Flexor Stretch  Both;30 seconds;1 rep    Hip Flexor Stretch Limitations  mod thomas with strap    Piriformis Stretch  Both;30 seconds;1 rep    Piriformis Stretch Limitations  mod piri    Other Knee/Hip Stretches  B hip flexor stretch on chair x 30 sec     Other Knee/Hip Stretches  Foam rolling to B quads on mat table x 30 sec       Knee/Hip Exercises: Aerobic   Nustep  NuStep: lvl 3, 7 min       Knee/Hip Exercises: Standing   Hip Abduction  Right;Left;10 reps;Knee straight    Abduction Limitations  counter     Hip Extension  Right;Left;10 reps    Extension Limitations  counter       Knee/Hip Exercises: Seated   Hamstring Curl  Right;Left;10 reps    Hamstring Limitations  with red TB    Sit to Sand  10 reps;without UE support      Knee/Hip Exercises: Supine   Bridges with Clamshell  10 reps;Both with sustained hip isometric abd/ER with red TB       Knee/Hip Exercises: Sidelying   Hip ABduction  Right;Left;10 reps B     Clams  B clam with red TB x 12               PT Short Term Goals - 01/07/17 0175      PT SHORT TERM GOAL #1   Title  Independent with initial HEP    Status  On-going        PT Long Term Goals - 01/07/17 0943      PT LONG TERM GOAL #1   Title  Independent with ongoing HEP    Status  On-going      PT LONG TERM GOAL #2   Title  B LE strength grossly 4/5 or greater for improved activity tolerance    Status  On-going      PT LONG TERM GOAL #3   Title   Pt will report ability to walk through [place] or grocery store w/o limitation due to "legs freezing up" or need for seated rest break     Status  On-going            Plan - 01/07/17 0943    Clinical Impression Statement  Brittany Mercado reporting some "leg pain" to start treatment with description of this seeming muscular in nature without known trigger.  This "leg pain" was reduced throughout therex today.  Reports she is performing HEP and, "the activities make me feel good".  Tolerated progression into standing LE strengthening well today.  Reviewed self-roller stick to B quads for use with rolling pin at home as pt. palpably tight throughout B quads.  Will continue to progress at pt. able in coming visits.       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 lymphadenopathy as well as bone metastasis diagnosed in September 2017 - currently treated with immunotherapy with Keytruda IV q 3 wks 7 & s/p palliative radiotherapy to metastatic bone lesion; hypothyroidism; HTN    PT Treatment/Interventions  Patient/family education;ADLs/Self Care Home Management;Therapeutic exercise;Neuromuscular re-education;Therapeutic activities;Functional mobility training;Gait training;Stair training;Manual techniques;Dry needling;Taping;Cryotherapy       Patient will benefit from skilled therapeutic intervention in order to improve the following deficits and impairments:  Difficulty walking, Decreased activity tolerance, Decreased strength, Impaired flexibility, Impaired sensation, Increased muscle spasms, Decreased endurance  Visit Diagnosis: Muscle weakness (generalized)  Difficulty in walking, not elsewhere classified  Unsteadiness on feet     Problem List Patient Active Problem List   Diagnosis Date Noted  . Chronic fatigue 12/24/2016  . Iron deficiency anemia due to chronic blood loss 12/10/2016  . Hypothyroidism (acquired)  02/22/2016  . HTN (hypertension) 02/01/2016  . Encounter for antineoplastic immunotherapy 12/21/2015  . Adenocarcinoma of right lung, stage 4 ([place]) 11/30/2015  . Encounter for antineoplastic chemotherapy 11/30/2015  . Tobacco  abuse 11/10/2015  . Lung mass 10/11/2015    Bess Harvest, PTA 01/07/17 12:29 PM  [facility] [address]  [place], [place], [address] Phone: [phone]   Fax:  [phone]  Name: Christyne Mccain MRN: [id number] Date of Birth: [birth year]

## 2017-01-08 ENCOUNTER — Ambulatory Visit: Payer: Medicare Other

## 2017-01-08 DIAGNOSIS — M6281 Muscle weakness (generalized): Secondary | ICD-10-CM

## 2017-01-08 DIAGNOSIS — R262 Difficulty in walking, not elsewhere classified: Secondary | ICD-10-CM | POA: Diagnosis not present

## 2017-01-08 DIAGNOSIS — R2681 Unsteadiness on feet: Secondary | ICD-10-CM | POA: Diagnosis not present

## 2017-01-08 NOTE — Therapy (Signed)
[facility] [address]  [place], [place], [address] Phone: [phone]   Fax:  [phone]  Physical Therapy Treatment  Patient Details  Name: Brittany Mercado MRN: [id number] Date of Birth: [birth year] Referring Provider: Riki Sheer, DO   Encounter Date: 01/08/2017  PT End of Session - 01/08/17 1105    Visit Number  3    Number of Visits  12    Date for PT Re-Evaluation  02/12/17    Authorization Type  UHC Medicare & Medicaid    PT Start Time  1101    PT Stop Time  1150    PT Time Calculation (min)  49 min    Activity Tolerance  Patient tolerated treatment well    Behavior During Therapy  [facility] for tasks assessed/performed       Past Medical History:  Diagnosis Date  . Adenocarcinoma of right lung, stage 4 ([place]) 11/30/2015  . Complication of anesthesia    hard to wake up  . Eczema   . Encounter for antineoplastic chemotherapy 11/30/2015  . Heart murmur    told at age 69 but never has had any problems  . History of radiation therapy 01/26/16-02/07/16   right pelvis 30 Gy in 10 fractions  . HTN (hypertension) 02/01/2016  . 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 Iron deficiency anemia due to chronic blood loss 12/10/2016    Past Surgical History:  Procedure Laterality Date  . ABDOMINAL HYSTERECTOMY    . OOPHORECTOMY  1996   tumor removed from right ovary  . TONSILLECTOMY      There were no vitals filed for this visit.  Subjective Assessment - 01/08/17 1204    Subjective  Jodee reports she felt good following last visit just felt tired.      Patient Stated Goals  "legs stop freezing up"    Currently in Pain?  No/denies    Pain Score  0-No pain    Multiple Pain Sites  No                      [facility] Adult PT Treatment/Exercise - 01/08/17 1110      Self-Care   Other Self-Care Comments   Self-roller stick to lateral quads in seated for use at home with rolling pin       Knee/Hip  Exercises: Stretches   Hip Flexor Stretch  Both;30 seconds;1 rep    Hip Flexor Stretch Limitations  mod thomas with strap gentle STM massage/strumming to B quads       Knee/Hip Exercises: Aerobic   Stationary Bike  Lvl 1, 6 min      Knee/Hip Exercises: Standing   Hip Abduction  Right;Left;Knee straight;15 reps    Abduction Limitations  counter     Hip Extension  Right;Left;15 reps    Extension Limitations  counter     Lateral Step Up  Step Height: 6";Right;Left;Hand Hold: 1    Lateral Step Up Limitations  1 UE support on chair     Forward Step Up  Right;Left;10 reps;Hand Hold: 1;Step Height: 6"    Forward Step Up Limitations  1 UE light chair support     Step Down  Right;Left;Step Height: 4";10 reps;Hand Hold: 1    Step Down Limitations  1 UE support on chair     Other Standing Knee Exercises  Standing fitter hip extension (2 blue bands) x 15 reps; 2 ski pole support       Knee/Hip  Exercises: Sidelying   Clams  B clam with red TB x 15               PT Short Term Goals - 01/07/17 0943      PT SHORT TERM GOAL #1   Title  Independent with initial HEP    Status  On-going        PT Long Term Goals - 01/07/17 0943      PT LONG TERM GOAL #1   Title  Independent with ongoing HEP    Status  On-going      PT LONG TERM GOAL #2   Title  B LE strength grossly 4/5 or greater for improved activity tolerance    Status  On-going      PT LONG TERM GOAL #3   Title  Pt will report ability to walk through [place] or grocery store w/o limitation due to "legs freezing up" or need for seated rest break     Status  On-going            Plan - 01/08/17 1109    Clinical Impression Statement  Brittany Mercado doing well today and denies any soreness following last visit.  Pt. tolerated addition of standing fitter hip extension, and weighted high-knee march well today.  Added forward, lateral, and eccentric step-downs today without issue.  Pt. ending treatment reporting LE fatigue however pain  free.      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 lymphadenopathy as well as bone metastasis diagnosed in September 2017 - currently treated with immunotherapy with Keytruda IV q 3 wks 7 & s/p palliative radiotherapy to metastatic bone lesion; hypothyroidism; HTN    PT Treatment/Interventions  Patient/family education;ADLs/Self Care Home Management;Therapeutic exercise;Neuromuscular re-education;Therapeutic activities;Functional mobility training;Gait training;Stair training;Manual techniques;Dry needling;Taping;Cryotherapy       Patient will benefit from skilled therapeutic intervention in order to improve the following deficits and impairments:  Difficulty walking, Decreased activity tolerance, Decreased strength, Impaired flexibility, Impaired sensation, Increased muscle spasms, Decreased endurance  Visit Diagnosis: Muscle weakness (generalized)  Difficulty in walking, not elsewhere classified  Unsteadiness on feet     Problem List Patient Active Problem List   Diagnosis Date Noted  . Chronic fatigue 12/24/2016  . Iron deficiency anemia due to chronic blood loss 12/10/2016  . 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 HTN (hypertension) 02/01/2016  . Encounter for antineoplastic immunotherapy 12/21/2015  . Adenocarcinoma of right lung, stage 4 ([place]) 11/30/2015  . Encounter for antineoplastic chemotherapy 11/30/2015  . Tobacco abuse 11/10/2015  . Lung mass 10/11/2015    Bess Harvest 01/08/2017, 12:08 PM  [facility] [address]  [place], [place], [address] Phone: [phone]   Fax:  [phone]  Name: Brittany Mercado MRN: [id number] Date of Birth: 1950/07/28

## 2017-01-14 ENCOUNTER — Ambulatory Visit: Payer: Medicare Other

## 2017-01-14 ENCOUNTER — Ambulatory Visit (HOSPITAL_BASED_OUTPATIENT_CLINIC_OR_DEPARTMENT_OTHER): Payer: Medicare Other | Admitting: Internal Medicine

## 2017-01-14 ENCOUNTER — Ambulatory Visit (HOSPITAL_BASED_OUTPATIENT_CLINIC_OR_DEPARTMENT_OTHER): Payer: Medicare Other

## 2017-01-14 ENCOUNTER — Other Ambulatory Visit (HOSPITAL_BASED_OUTPATIENT_CLINIC_OR_DEPARTMENT_OTHER): Payer: Medicare Other

## 2017-01-14 ENCOUNTER — Telehealth: Payer: Self-pay | Admitting: Internal Medicine

## 2017-01-14 ENCOUNTER — Encounter: Payer: Self-pay | Admitting: Internal Medicine

## 2017-01-14 VITALS — BP 151/62 | HR 93 | Temp 98.2°F | Resp 18 | Ht 63.0 in | Wt 100.4 lb

## 2017-01-14 DIAGNOSIS — C3491 Malignant neoplasm of unspecified part of right bronchus or lung: Secondary | ICD-10-CM

## 2017-01-14 DIAGNOSIS — Z79899 Other long term (current) drug therapy: Secondary | ICD-10-CM | POA: Diagnosis not present

## 2017-01-14 DIAGNOSIS — Z5112 Encounter for antineoplastic immunotherapy: Secondary | ICD-10-CM

## 2017-01-14 DIAGNOSIS — Z006 Encounter for examination for normal comparison and control in clinical research program: Secondary | ICD-10-CM

## 2017-01-14 DIAGNOSIS — C3431 Malignant neoplasm of lower lobe, right bronchus or lung: Secondary | ICD-10-CM

## 2017-01-14 DIAGNOSIS — C7951 Secondary malignant neoplasm of bone: Secondary | ICD-10-CM | POA: Diagnosis not present

## 2017-01-14 DIAGNOSIS — R5382 Chronic fatigue, unspecified: Secondary | ICD-10-CM

## 2017-01-14 LAB — COMPREHENSIVE METABOLIC PANEL
ALT: 33 U/L (ref 0–55)
ANION GAP: 9 meq/L (ref 3–11)
AST: 46 U/L — ABNORMAL HIGH (ref 5–34)
Albumin: 3.9 g/dL (ref 3.5–5.0)
Alkaline Phosphatase: 287 U/L — ABNORMAL HIGH (ref 40–150)
BUN: 26.5 mg/dL — ABNORMAL HIGH (ref 7.0–26.0)
CALCIUM: 9.9 mg/dL (ref 8.4–10.4)
CHLORIDE: 103 meq/L (ref 98–109)
CO2: 25 meq/L (ref 22–29)
CREATININE: 1 mg/dL (ref 0.6–1.1)
Glucose: 114 mg/dl (ref 70–140)
Potassium: 4.4 mEq/L (ref 3.5–5.1)
Sodium: 138 mEq/L (ref 136–145)
Total Bilirubin: 0.29 mg/dL (ref 0.20–1.20)
Total Protein: 8.7 g/dL — ABNORMAL HIGH (ref 6.4–8.3)

## 2017-01-14 LAB — CBC WITH DIFFERENTIAL/PLATELET
BASO%: 0.2 % (ref 0.0–2.0)
BASOS ABS: 0 10*3/uL (ref 0.0–0.1)
EOS%: 5.1 % (ref 0.0–7.0)
Eosinophils Absolute: 0.5 10*3/uL (ref 0.0–0.5)
HEMATOCRIT: 34.7 % — AB (ref 34.8–46.6)
HEMOGLOBIN: 11.6 g/dL (ref 11.6–15.9)
LYMPH#: 1.3 10*3/uL (ref 0.9–3.3)
LYMPH%: 15.1 % (ref 14.0–49.7)
MCH: 25.1 pg (ref 25.1–34.0)
MCHC: 33.4 g/dL (ref 31.5–36.0)
MCV: 74.9 fL — AB (ref 79.5–101.0)
MONO#: 0.5 10*3/uL (ref 0.1–0.9)
MONO%: 5.2 % (ref 0.0–14.0)
NEUT#: 6.6 10*3/uL — ABNORMAL HIGH (ref 1.5–6.5)
NEUT%: 74.4 % (ref 38.4–76.8)
Platelets: 279 10*3/uL (ref 145–400)
RBC: 4.63 10*6/uL (ref 3.70–5.45)
WBC: 8.9 10*3/uL (ref 3.9–10.3)
nRBC: 0 % (ref 0–0)

## 2017-01-14 LAB — TECHNOLOGIST REVIEW

## 2017-01-14 LAB — RESEARCH LABS

## 2017-01-14 LAB — TSH

## 2017-01-14 MED ORDER — SODIUM CHLORIDE 0.9 % IV SOLN
200.0000 mg | Freq: Once | INTRAVENOUS | Status: AC
  Administered 2017-01-14: 200 mg via INTRAVENOUS
  Filled 2017-01-14: qty 8

## 2017-01-14 MED ORDER — SODIUM CHLORIDE 0.9 % IV SOLN
Freq: Once | INTRAVENOUS | Status: AC
  Administered 2017-01-14: 13:00:00 via INTRAVENOUS

## 2017-01-14 NOTE — Progress Notes (Signed)
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  Genomic Alterations Identified? NF1 G362f*9 ARID1B [id number] PBRM1 [id number] TP53 P72f28f Additional Findings? Microsatellite status MS-Stable Tumor Mutation Burden TMB-Intermediate; 7 Muts/Mb Additional Disease-relevant Genes with No Reportable Alterations Identified? EGFR KRAS ALK BRAF MET ERBB2 RET ROS1  PRIOR THERAPY: Palliative radiotherapy to the metastatic bone lesion under the care of Dr. KinaSondra ComeURRENT THERAPY: First-line treatment with immunotherapy with Ketruda (pembrolizumab) 200 mg IV every 3 weeks status post 18 cycles. First cycle was given 12/21/2015.  INTERVAL HISTORY: Brittany Hewetty66. female returns to the clinic today for follow-up visit.  The patient has no complaints today.  She continues to tolerate her treatment with Keytruda fairly well.  She denied having any chest pain, shortness of breath, cough or hemoptysis.  She denied having any nausea, vomiting, diarrhea or constipation.  She has no recent weight loss or night sweats.  She is here today for evaluation before starting cycle #19.   MEDICAL HISTORY: Past Medical History:  Diagnosis Date  . Adenocarcinoma of right lung, stage 4 (HCC)[place]/05/2015  . Complication of anesthesia    hard to wake up  . Eczema   . Encounter for antineoplastic chemotherapy 11/30/2015  . Heart murmur    told at age 66 b54 never has had any problems  . History of radiation therapy 01/26/16-02/07/16   right pelvis 30 Gy in 10 fractions  . HTN (hypertension)  02/01/2016  . 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 Iron deficiency anemia due to chronic blood loss 12/10/2016    ALLERGIES:  is allergic to penicillins.  MEDICATIONS:  Current Outpatient Medications  Medication Sig Dispense Refill  . aspirin 325 MG tablet Take 325 mg by mouth daily.    . cetirizine (ZYRTEC) 10 MG tablet Take 1 tablet (10 mg total) by mouth daily. 30 tablet 0  . hydrocortisone cream 0.5 % Apply 1 application topically 2 (two) times daily. 30 g 2  . levothyroxine (SYNTHROID, LEVOTHROID) 100 MCG tablet TAKE 1 TABLET(100 MCG) BY MOUTH DAILY BEFORE BREAKFAST 30 tablet 0  . meloxicam (MOBIC) 7.5 MG tablet TAKE 1 TABLET(7.5 MG) BY MOUTH DAILY 90 tablet 0  . Multiple Vitamin (MULTIVITAMIN) tablet Take 1 tablet by mouth daily.     Current Facility-Administered Medications  Medication Dose Route Frequency Provider Last Rate Last Dose  . 0.9 %  sodium chloride infusion   Intravenous Once LemmLevin Erp  [place]  . 0.9 %  sodium chloride infusion   Intravenous Once LemmLevin Erp  [place]    SURGICAL HISTORY:  Past Surgical History:  Procedure Laterality Date  . ABDOMINAL HYSTERECTOMY    . OOPHORECTOMY  1996   tumor removed from right ovary  . TONSILLECTOMY    . VIDEO BRONCHOSCOPY WITH ENDOBRONCHIAL NAVIGATION N/A 11/18/2015   [facility] 11/18/2015   [facility] of systems was negative.   PHYSICAL  EXAMINATION: General appearance: alert, cooperative and no distress Head: Normocephalic, without obvious abnormality, atraumatic Neck: no adenopathy, no JVD, supple, symmetrical, trachea midline and thyroid not enlarged, symmetric, no tenderness/mass/nodules Lymph nodes: Cervical, supraclavicular, and axillary nodes normal. Resp: clear to auscultation bilaterally Back: symmetric, no  curvature. ROM normal. No CVA tenderness. Cardio: normal apical impulse GI: soft, non-tender; bowel sounds normal; no masses,  no organomegaly Extremities: extremities normal, atraumatic, no cyanosis or edema  ECOG PERFORMANCE STATUS: 1 - Symptomatic but completely ambulatory  Blood pressure (!) 151/62, pulse 93, temperature 98.2 F (36.8 C), temperature source Oral, resp. rate 18, height _0  (1.6 m), weight 100 lb 6.4 oz (45.5 kg), SpO2 100 %.  LABORATORY DATA: Lab Results  Component Value Date   WBC 8.9 01/14/2017   HGB 11.6 01/14/2017   HCT 34.7 (L) 01/14/2017   MCV 74.9 (L) 01/14/2017   PLT 279 01/14/2017      Chemistry      Component Value Date/Time   NA 138 01/14/2017 1047   K 4.4 01/14/2017 1047   CL 103 11/15/2015 1512   CO2 25 01/14/2017 1047   BUN 26.5 (H) 01/14/2017 1047   CREATININE 1.0 01/14/2017 1047      Component Value Date/Time   CALCIUM 9.9 01/14/2017 1047   ALKPHOS 287 (H) 01/14/2017 1047   AST 46 (H) 01/14/2017 1047   ALT 33 01/14/2017 1047   BILITOT 0.29 01/14/2017 1047       RADIOGRAPHIC STUDIES: No results found.  ASSESSMENT AND PLAN:  This is a very pleasant 66 years old African-American female with metastatic non-small cell lung cancer, adenocarcinoma with positive PDL 1 expression of 99%. She is currently undergoing treatment with Ketruda 200 mg IV every 3 weeks, status post 18 cycles. She continues to tolerate her treatment with immunotherapy fairly well. I recommended for the patient to proceed with cycle #19 today as a scheduled. I will see her back for follow-up visit in 3 weeks for evaluation before starting cycle #20. The patient was advised to call immediately if she has any concerning symptoms in the interval. The patient voices understanding of current disease status and treatment options and is in agreement with the current care plan. All questions were answered. The patient knows to call the clinic with any problems, questions  or concerns. We can certainly see the patient much sooner if necessary.  Disclaimer: This note was dictated with voice recognition software. Similar sounding words can inadvertently be transcribed and may not be corrected upon review.

## 2017-01-14 NOTE — Patient Instructions (Signed)
[facility] Discharge Instructions for Patients Receiving Chemotherapy  Today you received the following chemotherapy agents:  Keytruda.  To help prevent nausea and vomiting after your treatment, we encourage you to take your nausea medication as directed.   If you develop nausea and vomiting that is not controlled by your nausea medication, call the clinic.   BELOW ARE SYMPTOMS THAT SHOULD BE REPORTED IMMEDIATELY:  *FEVER GREATER THAN 100.5 F  *CHILLS WITH OR WITHOUT FEVER  NAUSEA AND VOMITING THAT IS NOT CONTROLLED WITH YOUR NAUSEA MEDICATION  *UNUSUAL SHORTNESS OF BREATH  *UNUSUAL BRUISING OR BLEEDING  TENDERNESS IN MOUTH AND THROAT WITH OR WITHOUT PRESENCE OF ULCERS  *URINARY PROBLEMS  *BOWEL PROBLEMS  UNUSUAL RASH Items with * indicate a potential emergency and should be followed up as soon as possible.  Feel free to call the clinic should you have any questions or concerns. The clinic phone number is [phone].  Please show the CHEMO ALERT CARD at check-in to the Emergency Department and triage nurse.    

## 2017-01-14 NOTE — Telephone Encounter (Signed)
Gave avs and calendar for December - February 2019

## 2017-01-15 ENCOUNTER — Ambulatory Visit: Payer: Medicare Other

## 2017-01-15 ENCOUNTER — Encounter: Payer: Self-pay | Admitting: *Deleted

## 2017-01-15 DIAGNOSIS — R262 Difficulty in walking, not elsewhere classified: Secondary | ICD-10-CM

## 2017-01-15 DIAGNOSIS — M6281 Muscle weakness (generalized): Secondary | ICD-10-CM | POA: Diagnosis not present

## 2017-01-15 DIAGNOSIS — R2681 Unsteadiness on feet: Secondary | ICD-10-CM | POA: Diagnosis not present

## 2017-01-15 NOTE — Therapy (Addendum)
[facility] [address]  [place], [place], [address] Phone: [phone]   Fax:  [phone]  Physical Therapy Treatment  Patient Details  Name: Brittany Mercado MRN: [id number] Date of Birth: [birth year] Referring Provider: Riki Sheer, DO   Encounter Date: 01/15/2017  PT End of Session - 01/15/17 0934    Visit Number  4    Number of Visits  12    Date for PT Re-Evaluation  02/12/17    Authorization Type  UHC Medicare & Medicaid    PT Start Time  0930    PT Stop Time  1014    PT Time Calculation (min)  44 min    Activity Tolerance  Patient tolerated treatment well    Behavior During Therapy  [facility] for tasks assessed/performed       Past Medical History:  Diagnosis Date  . Adenocarcinoma of right lung, stage 4 ([place]) 11/30/2015  . Complication of anesthesia    hard to wake up  . Eczema   . Encounter for antineoplastic chemotherapy 11/30/2015  . Heart murmur    told at age 59 but never has had any problems  . History of radiation therapy 01/26/16-02/07/16   right pelvis 30 Gy in 10 fractions  . HTN (hypertension) 02/01/2016  . 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 Iron deficiency anemia due to chronic blood loss 12/10/2016    Past Surgical History:  Procedure Laterality Date  . ABDOMINAL HYSTERECTOMY    . OOPHORECTOMY  1996   tumor removed from right ovary  . TONSILLECTOMY    . VIDEO BRONCHOSCOPY WITH ENDOBRONCHIAL NAVIGATION N/A 11/18/2015   Procedure: VIDEO BRONCHOSCOPY WITH ENDOBRONCHIAL NAVIGATION;  Surgeon: Melrose Nakayama, MD;  Location: [place];  Service: Thoracic;  Laterality: N/A;  . VIDEO BRONCHOSCOPY WITH ENDOBRONCHIAL ULTRASOUND N/A 11/18/2015   Procedure: VIDEO BRONCHOSCOPY WITH ENDOBRONCHIAL ULTRASOUND;  Surgeon: Melrose Nakayama, MD;  Location: [facility];  Service: Thoracic;  Laterality: N/A;    There were no vitals filed for this visit.  Subjective Assessment - 01/15/17 0932    Subjective  Brittany Mercado reporting she feels tired today following infusion yesterday.  No pain or complaints today.  Pt. reporting she has not had the "legs freeze up" since starting therapy.      How long can you walk comfortably?  20 min     Patient Stated Goals  "legs stop freezing up"    Currently in Pain?  No/denies    Pain Score  0-No pain    Multiple Pain Sites  No                      [facility] Adult PT Treatment/Exercise - 01/15/17 0941      Knee/Hip Exercises: Machines for Strengthening   Cybex Knee Extension  B LE's 10# x 10 reps    Cybex Knee Flexion  B LE's 15# x 10 reps     Cybex Leg Press  B LE's x 15# x 15 reps    Total Gym Leg Press  --      Knee/Hip Exercises: Standing   Hip Flexion  Right;Left;10 reps;Knee straight    Hip Flexion Limitations  counter; yellow TB at ankles    Hip Abduction  Right;Left;10 reps;Knee straight    Abduction Limitations  counter; yellow band at ankles    Hip Extension  Right;Left;10 reps;Knee straight    Extension Limitations  counter; yellow TB at ankles    Lateral Step  Up  Step Height: 6";Right;Left;Hand Hold: 1    Lateral Step Up Limitations  1 UE support machine bolster     Forward Step Up  15 reps;Right;Left;Hand Hold: 1;Step Height: 6"    Forward Step Up Limitations  1 UE support       Knee/Hip Exercises: Seated   Clamshell with TheraBand  Red 3" x 10 reps     Sit to Sand  10 reps;without UE support;2 sets      Knee/Hip Exercises: Supine   Bridges with Clamshell  Both;15 reps with sustained hip abd/ER isometrics with red band at knees              PT Education - 01/16/17 0913    Education provided  Yes    Education Details  3-way SLR with yellow looped TB issued to pt., sit<>stand     Person(s) Educated  Patient    Methods  Explanation;Demonstration;Verbal cues;Handout    Comprehension  Verbalized understanding;Returned demonstration;Verbal cues required;Need further instruction       PT Short Term Goals -  01/15/17 0956      PT SHORT TERM GOAL #1   Title  Independent with initial HEP    Status  Achieved        PT Long Term Goals - 01/07/17 0943      PT LONG TERM GOAL #1   Title  Independent with ongoing HEP    Status  On-going      PT LONG TERM GOAL #2   Title  B LE strength grossly 4/5 or greater for improved activity tolerance    Status  On-going      PT LONG TERM GOAL #3   Title  Pt will report ability to walk through [place] or grocery store w/o limitation due to "legs freezing up" or need for seated rest break     Status  On-going            Plan - 01/15/17 1006    Clinical Impression Statement  Brittany Mercado reporting some "tiredness" today following infusion yesterday.  Tolerated advancement of 3-way SLR to yellow TB resistance today and addition of machine strengthening activities.  Progressing well with LE strengthening activities at this point and reports no further incidents of "legs freezing" since starting therapy.      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 lymphadenopathy as well as bone metastasis diagnosed in September 2017 - currently treated with immunotherapy with Keytruda IV q 3 wks 7 & s/p palliative radiotherapy to metastatic bone lesion; hypothyroidism; HTN    PT Treatment/Interventions  Patient/family education;ADLs/Self Care Home Management;Therapeutic exercise;Neuromuscular re-education;Therapeutic activities;Functional mobility training;Gait training;Stair training;Manual techniques;Dry needling;Taping;Cryotherapy       Patient will benefit from skilled therapeutic intervention in order to improve the following deficits and impairments:  Difficulty walking, Decreased activity tolerance, Decreased strength, Impaired flexibility, Impaired sensation, Increased muscle spasms, Decreased endurance  Visit Diagnosis: Muscle weakness (generalized)  Difficulty in walking, not  elsewhere classified     Problem List Patient Active Problem List   Diagnosis Date Noted  . Chronic fatigue 12/24/2016  . Iron deficiency anemia due to chronic blood loss 12/10/2016  . 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 HTN (hypertension) 02/01/2016  . Encounter for antineoplastic immunotherapy 12/21/2015  . Adenocarcinoma of right lung, stage 4 ([place]) 11/30/2015  . Encounter for antineoplastic chemotherapy 11/30/2015  . Tobacco abuse 11/10/2015  . Lung mass 10/11/2015    Brittany Mercado,  PTA 01/16/17 9:14 AM  [facility] [address]  [place], [place], [address] Phone: [phone]   Fax:  [phone]  Name: Brittany Mercado MRN: [id number] Date of Birth: June 10, 1950

## 2017-01-21 ENCOUNTER — Encounter: Payer: Self-pay | Admitting: Physical Therapy

## 2017-01-21 ENCOUNTER — Ambulatory Visit: Payer: Medicare Other | Admitting: Physical Therapy

## 2017-01-21 DIAGNOSIS — R2681 Unsteadiness on feet: Secondary | ICD-10-CM | POA: Diagnosis not present

## 2017-01-21 DIAGNOSIS — R262 Difficulty in walking, not elsewhere classified: Secondary | ICD-10-CM

## 2017-01-21 DIAGNOSIS — M6281 Muscle weakness (generalized): Secondary | ICD-10-CM | POA: Diagnosis not present

## 2017-01-21 NOTE — Therapy (Signed)
[facility] [address]  [place], [place], [address] Phone: [phone]   Fax:  [phone]  Physical Therapy Treatment  Patient Details  Name: Brittany Mercado MRN: [id number] Date of Birth: February 19, 1951 Referring Provider: Riki Sheer, DO   Encounter Date: 01/21/2017  PT End of Session - 01/21/17 1301    Visit Number  5    Number of Visits  12    Date for PT Re-Evaluation  02/12/17    Authorization Type  [employer]HC Medicare & Medicaid    PT Start Time  1258    PT Stop Time  1338    PT Time Calculation (min)  40 min    Activity Tolerance  Patient tolerated treatment well    Behavior During Therapy  [facility] for tasks assessed/performed       Past Medical History:  Diagnosis Date  . Adenocarcinoma of right lung, stage 4 ([place]) 11/30/2015  . Complication of anesthesia    hard to wake up  . Eczema   . Encounter for antineoplastic chemotherapy 11/30/2015  . Heart murmur    told at age 89 but never has had any problems  . History of radiation therapy 01/26/16-02/07/16   right pelvis 30 Gy in 10 fractions  . HTN (hypertension) 02/01/2016  . 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 Iron deficiency anemia due to chronic blood loss 12/10/2016    Past Surgical History:  Procedure Laterality Date  . ABDOMINAL HYSTERECTOMY    . OOPHORECTOMY  1996   tumor removed from right ovary  . TONSILLECTOMY    . VIDEO BRONCHOSCOPY WITH ENDOBRONCHIAL NAVIGATION N/A 11/18/2015   Procedure: VIDEO BRONCHOSCOPY WITH ENDOBRONCHIAL NAVIGATION;  Surgeon: Melrose Nakayama, MD;  Location: [facility];  Service: Thoracic;  Laterality: N/A;  . VIDEO BRONCHOSCOPY WITH ENDOBRONCHIAL ULTRASOUND N/A 11/18/2015   Procedure: VIDEO BRONCHOSCOPY WITH ENDOBRONCHIAL ULTRASOUND;  Surgeon: Melrose Nakayama, MD;  Location: [facility];  Service: Thoracic;  Laterality: N/A;    There were no vitals filed for this visit.  Subjective Assessment - 01/21/17 1259    Subjective  Some fatigue this afternoon; some "freezing" still noted - describes freezing as completely stopping in her tracks and having to rub legs to continue walking.     Patient Stated Goals  "legs stop freezing up"    Currently in Pain?  No/denies    Pain Score  0-No pain                      [facility] Adult PT Treatment/Exercise - 01/21/17 1302      Knee/Hip Exercises: Aerobic   Stationary Bike  --    Recumbent Bike  L1 x 6 min      Knee/Hip Exercises: Machines for Strengthening   Cybex Knee Extension  B LE - 15# x 10    Cybex Knee Flexion  B LE - 15# 2 x 10    Cybex Leg Press  B LE - 20# x 15 reps      Knee/Hip Exercises: Standing   Heel Raises  Both;15 reps    Heel Raises Limitations  negative; B UE support    Functional Squat  15 reps    Functional Squat Limitations  TRX    Wall Squat  15 reps    Wall Squat Limitations  to ~75 degrees    Other Standing Knee Exercises  side stepping - red tband at ankles x 20 feet each direction  Knee/Hip Exercises: Seated   Other Seated Knee/Hip Exercises  seated rows - red tband x 15 reps    Other Seated Knee/Hip Exercises  seated pallof press - single red tband x 15 each side    Sit to Sand  2 sets;5 reps;without UE support standing on 1 AirEx               PT Short Term Goals - 01/15/17 0956      PT SHORT TERM GOAL #1   Title  Independent with initial HEP    Status  Achieved        PT Long Term Goals - 01/07/17 0943      PT LONG TERM GOAL #1   Title  Independent with ongoing HEP    Status  On-going      PT LONG TERM GOAL #2   Title  B LE strength grossly 4/5 or greater for improved activity tolerance    Status  On-going      PT LONG TERM GOAL #3   Title  Pt will report ability to walk through [place] or grocery store w/o limitation due to "legs freezing up" or need for seated rest break     Status  On-going            Plan - 01/21/17 1301    Clinical Impression Statement  Ms. Brittany Mercado  doing well today with all mid back, core, and LE strengthening today with no issue. Some rest breaks required after each set due to fatigue, as expected. Patient tolerating all progression today with most difficulty with added weight on seated knee extension. Will continue to progress towards goals.     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 lymphadenopathy as well as bone metastasis diagnosed in September 2017 - currently treated with immunotherapy with Keytruda IV q 3 wks 7 & s/p palliative radiotherapy to metastatic bone lesion; hypothyroidism; HTN    PT Treatment/Interventions  Patient/family education;ADLs/Self Care Home Management;Therapeutic exercise;Neuromuscular re-education;Therapeutic activities;Functional mobility training;Gait training;Stair training;Manual techniques;Dry needling;Taping;Cryotherapy    Consulted and Agree with Plan of Care  Patient       Patient will benefit from skilled therapeutic intervention in order to improve the following deficits and impairments:  Difficulty walking, Decreased activity tolerance, Decreased strength, Impaired flexibility, Impaired sensation, Increased muscle spasms, Decreased endurance  Visit Diagnosis: Muscle weakness (generalized)  Difficulty in walking, not elsewhere classified  Unsteadiness on feet     Problem List Patient Active Problem List   Diagnosis Date Noted  . Chronic fatigue 12/24/2016  . Iron deficiency anemia due to chronic blood loss 12/10/2016  . 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 HTN (hypertension) 02/01/2016  . Encounter for antineoplastic immunotherapy 12/21/2015  . Adenocarcinoma of right lung, stage 4 ([place]) 11/30/2015  . Encounter for antineoplastic chemotherapy 11/30/2015  . Tobacco abuse 11/10/2015  . Lung mass 10/11/2015     Lanney Gins, PT, DPT 01/21/17 1:41 PM    [facility] [address]  [place], [place], [address] Phone: [phone]   Fax:  [phone]  Name: Brittany Mercado MRN: [id number] Date of Birth: 1951/02/18

## 2017-01-22 ENCOUNTER — Ambulatory Visit: Payer: Medicare Other

## 2017-01-22 DIAGNOSIS — R262 Difficulty in walking, not elsewhere classified: Secondary | ICD-10-CM | POA: Diagnosis not present

## 2017-01-22 DIAGNOSIS — R2681 Unsteadiness on feet: Secondary | ICD-10-CM

## 2017-01-22 DIAGNOSIS — M6281 Muscle weakness (generalized): Secondary | ICD-10-CM

## 2017-01-22 NOTE — Therapy (Signed)
[facility] [address]  [place], [place], [address] Phone: [phone]   Fax:  [phone]  Physical Therapy Treatment  Patient Details  Name: Brittany Mercado MRN: [id number] Date of Birth: 14-Mar-1950 Referring Provider: Riki Sheer, DO   Encounter Date: 01/22/2017  PT End of Session - 01/22/17 1323    Visit Number  6    Number of Visits  12    Date for PT Re-Evaluation  02/12/17    Authorization Type  UHC Medicare & Medicaid    PT Start Time  1318    PT Stop Time  1400    PT Time Calculation (min)  42 min    Activity Tolerance  Patient tolerated treatment well    Behavior During Therapy  [facility] for tasks assessed/performed       Past Medical History:  Diagnosis Date  . Adenocarcinoma of right lung, stage 4 ([place]) 11/30/2015  . Complication of anesthesia    hard to wake up  . Eczema   . Encounter for antineoplastic chemotherapy 11/30/2015  . Heart murmur    told at age 69 but never has had any problems  . History of radiation therapy 01/26/16-02/07/16   right pelvis 30 Gy in 10 fractions  . HTN (hypertension) 02/01/2016  . 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 Iron deficiency anemia due to chronic blood loss 12/10/2016    Past Surgical History:  Procedure Laterality Date  . ABDOMINAL HYSTERECTOMY    . OOPHORECTOMY  1996   tumor removed from right ovary  . TONSILLECTOMY    . VIDEO BRONCHOSCOPY WITH ENDOBRONCHIAL NAVIGATION N/A 11/18/2015   Procedure: VIDEO BRONCHOSCOPY WITH ENDOBRONCHIAL NAVIGATION;  Surgeon: Melrose Nakayama, MD;  Location: [facility];  Service: Thoracic;  Laterality: N/A;  . VIDEO BRONCHOSCOPY WITH ENDOBRONCHIAL ULTRASOUND N/A 11/18/2015   Procedure: VIDEO BRONCHOSCOPY WITH ENDOBRONCHIAL ULTRASOUND;  Surgeon: Melrose Nakayama, MD;  Location: [facility];  Service: Thoracic;  Laterality: N/A;    There were no vitals filed for this visit.  Subjective Assessment - 01/22/17 1321    Subjective  Pt. reporting she has B thigh "muscular" soreness today which she attributes to exercises yesterday.      Patient Stated Goals  "legs stop freezing up"    Currently in Pain?  Yes    Pain Score  6     Pain Location  Leg muscular soreness    Pain Orientation  Right;Left    Pain Descriptors / Indicators  Sore    Pain Type  Acute pain    Pain Onset  Yesterday    Multiple Pain Sites  No                      [facility] Adult PT Treatment/Exercise - 01/22/17 1324      Lumbar Exercises: Standing   Row  15 reps      Lumbar Exercises: Seated   Hip Flexion on Ball  Right;Left;10 reps CGA     Hip Flexion on Ball Limitations  green p-ball     Other Seated Lumbar Exercises  Alternating UE raise seated on green p-ball x 10 reps each way  supervision     Other Seated Lumbar Exercises  B shoulder extension with red TB closed in door seated on green p-ball x 15 reps  supervision       Lumbar Exercises: Supine   Isometric Hip Flexion  10 reps;3 seconds 2 sets     Isometric  Hip Flexion Limitations  Alternating single LE hip isometrics  2nd set with B hip flexion isometrics       Lumbar Exercises: Quadruped   Single Arm Raise  Right;Left;10 reps;3 seconds    Single Arm Raises Limitations  peanut p-ball support       Knee/Hip Exercises: Aerobic   Recumbent Bike  L1 x 6 min      Knee/Hip Exercises: Standing   Functional Squat  15 reps    Functional Squat Limitations  counter; mod cues for proper technique     Other Standing Knee Exercises  Alternating lunge onto BOSU ball (up) x 15 reps each LE; 2 ski poles       Knee/Hip Exercises: Seated   Other Seated Knee/Hip Exercises  --    Marching  --    Marching Limitations  --             PT Education - 01/22/17 1428    Education provided  Yes    Education Details  counter squat     Person(s) Educated  Patient    Methods  Explanation;Demonstration;Verbal cues;Handout    Comprehension  Verbalized understanding;Returned  demonstration;Verbal cues required;Need further instruction       PT Short Term Goals - 01/15/17 0956      PT SHORT TERM GOAL #1   Title  Independent with initial HEP    Status  Achieved        PT Long Term Goals - 01/07/17 0943      PT LONG TERM GOAL #1   Title  Independent with ongoing HEP    Status  On-going      PT LONG TERM GOAL #2   Title  B LE strength grossly 4/5 or greater for improved activity tolerance    Status  On-going      PT LONG TERM GOAL #3   Title  Pt will report ability to walk through [place] or grocery store w/o limitation due to "legs freezing up" or need for seated rest break     Status  On-going            Plan - 01/22/17 1407    Clinical Impression Statement  Brittany Mercado seen to start treatment with some report of muscular sorness which she attributes to exercises from yesterday's session.  Tolerated addition of lumbopelvic stability activities on p-ball well today and able to demo proper technique with counter squat after some instruction.  Counter squat added to HEP.  Brittany Mercado progressing well with all strengthening activities and progressing toward goals.      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 lymphadenopathy as well as bone metastasis diagnosed in September 2017 - currently treated with immunotherapy with Keytruda IV q 3 wks 7 & s/p palliative radiotherapy to metastatic bone lesion; hypothyroidism; HTN    PT Treatment/Interventions  Patient/family education;ADLs/Self Care Home Management;Therapeutic exercise;Neuromuscular re-education;Therapeutic activities;Functional mobility training;Gait training;Stair training;Manual techniques;Dry needling;Taping;Cryotherapy    Consulted and Agree with Plan of Care  Patient       Patient will benefit from skilled therapeutic intervention in order to improve the following deficits and impairments:  Difficulty walking,  Decreased activity tolerance, Decreased strength, Impaired flexibility, Impaired sensation, Increased muscle spasms, Decreased endurance  Visit Diagnosis: Muscle weakness (generalized)  Difficulty in walking, not elsewhere classified  Unsteadiness on feet     Problem List Patient Active Problem List   Diagnosis Date Noted  . Chronic  fatigue 12/24/2016  . Iron deficiency anemia due to chronic blood loss 12/10/2016  . 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 HTN (hypertension) 02/01/2016  . Encounter for antineoplastic immunotherapy 12/21/2015  . Adenocarcinoma of right lung, stage 4 ([place]) 11/30/2015  . Encounter for antineoplastic chemotherapy 11/30/2015  . Tobacco abuse 11/10/2015  . Lung mass 10/11/2015    Bess Harvest, PTA 01/22/17 2:29 PM  [facility] [address]  [place], [place], [address] Phone: [phone]   Fax:  [phone]  Name: Brittany Mercado MRN: [id number] Date of Birth: 1950-11-01

## 2017-01-28 ENCOUNTER — Ambulatory Visit: Payer: Medicare Other | Attending: Family Medicine | Admitting: Physical Therapy

## 2017-01-28 ENCOUNTER — Encounter: Payer: Self-pay | Admitting: Physical Therapy

## 2017-01-28 ENCOUNTER — Other Ambulatory Visit: Payer: Self-pay | Admitting: Internal Medicine

## 2017-01-28 DIAGNOSIS — M6281 Muscle weakness (generalized): Secondary | ICD-10-CM | POA: Diagnosis not present

## 2017-01-28 DIAGNOSIS — Z5112 Encounter for antineoplastic immunotherapy: Secondary | ICD-10-CM

## 2017-01-28 DIAGNOSIS — R262 Difficulty in walking, not elsewhere classified: Secondary | ICD-10-CM | POA: Insufficient documentation

## 2017-01-28 DIAGNOSIS — C3491 Malignant neoplasm of unspecified part of right bronchus or lung: Secondary | ICD-10-CM

## 2017-01-28 DIAGNOSIS — R2681 Unsteadiness on feet: Secondary | ICD-10-CM | POA: Insufficient documentation

## 2017-01-28 DIAGNOSIS — J302 Other seasonal allergic rhinitis: Secondary | ICD-10-CM

## 2017-01-28 NOTE — Therapy (Signed)
[facility] [address]  [place], [place], [address] Phone: [phone]   Fax:  [phone]  Physical Therapy Treatment  Patient Details  Name: Brittany Mercado MRN: [id number] Date of Birth: [birth year] Referring Provider: Riki Sheer, DO   Encounter Date: 01/28/2017  PT End of Session - 01/28/17 1310    Visit Number  7    Number of Visits  12    Date for PT Re-Evaluation  02/12/17    Authorization Type  UHC Medicare & Medicaid    PT Start Time  1310    PT Stop Time  1354    PT Time Calculation (min)  44 min    Activity Tolerance  Patient tolerated treatment well    Behavior During Therapy  [facility] for tasks assessed/performed       Past Medical History:  Diagnosis Date  . Adenocarcinoma of right lung, stage 4 ([place]) 11/30/2015  . Complication of anesthesia    hard to wake up  . Eczema   . Encounter for antineoplastic chemotherapy 11/30/2015  . Heart murmur    told at age 66 but never has had any problems  . History of radiation therapy 01/26/16-02/07/16   right pelvis 30 Gy in 10 fractions  . HTN (hypertension) 02/01/2016  . 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 Iron deficiency anemia due to chronic blood loss 12/10/2016    Past Surgical History:  Procedure Laterality Date  . ABDOMINAL HYSTERECTOMY    . OOPHORECTOMY  1996   tumor removed from right ovary  . TONSILLECTOMY    . VIDEO BRONCHOSCOPY WITH ENDOBRONCHIAL NAVIGATION N/A 11/18/2015   Procedure: VIDEO BRONCHOSCOPY WITH ENDOBRONCHIAL NAVIGATION;  Surgeon: Melrose Nakayama, MD;  Location: [facility];  Service: Thoracic;  Laterality: N/A;  . VIDEO BRONCHOSCOPY WITH ENDOBRONCHIAL ULTRASOUND N/A 11/18/2015   Procedure: VIDEO BRONCHOSCOPY WITH ENDOBRONCHIAL ULTRASOUND;  Surgeon: Melrose Nakayama, MD;  Location: [facility];  Service: Thoracic;  Laterality: N/A;    There were no vitals filed for this visit.  Subjective Assessment - 01/28/17 1314    Subjective  Pt reporting no issues with current HEP.    Patient Stated Goals  "legs stop freezing up"    Currently in Pain?  No/denies    Pain Score  0-No pain    Pain Onset  Yesterday                      [facility] Adult PT Treatment/Exercise - 01/28/17 1310      Knee/Hip Exercises: Aerobic   Nustep  lvl 4 x 6'      Knee/Hip Exercises: Standing   Hip Flexion  Both;10 reps;Knee straight;Stengthening    Hip Flexion Limitations  yellow TB, 2 pole A    Forward Lunges  Both;10 reps;3 seconds    Forward Lunges Limitations  verbal & tactile cues for proper alignment    Side Lunges  Both;5 reps    Side Lunges Limitations  TRX, deferred d/t c/o foot pain    Hip ADduction  Both;10 reps;Strengthening    Hip ADduction Limitations  yellow TB, 2 pole A    Hip Abduction  Both;10 reps;Knee straight;Stengthening    Abduction Limitations  yellow TB, 2 pole A    Hip Extension  Both;10 reps;Knee straight;Stengthening    Extension Limitations  yellow TB, 2 pole A    Step Down  Both;10 reps;Step Height: 6";Hand Hold: 2    Step Down Limitations  lareral eccentric lowering  Functional Squat  15 reps;3 seconds    Functional Squat Limitations  TRX    SLS with Vectors  SLS with opp toe tap to balance pebbles at 12,3,6,9 x5 each, repeated for additional 5 reps with SLS on blue foam oval    Other Standing Knee Exercises  B side stepping & fwd monster walk - red TB at ankles x 20 feet each direction    Other Standing Knee Exercises  BOSU lat & A/P wt shift, minisquat x10 each, 2 pole A               PT Short Term Goals - 01/15/17 6294      PT SHORT TERM GOAL #1   Title  Independent with initial HEP    Status  Achieved        PT Long Term Goals - 01/07/17 0943      PT LONG TERM GOAL #1   Title  Independent with ongoing HEP    Status  On-going      PT LONG TERM GOAL #2   Title  B LE strength grossly 4/5 or greater for improved activity tolerance    Status  On-going      PT  LONG TERM GOAL #3   Title  Pt will report ability to walk through [place] or grocery store w/o limitation due to "legs freezing up" or need for seated rest break     Status  On-going            Plan - 01/28/17 1316    Clinical Impression Statement  Pt reporting good compliance with HEP with no concerns identified and pt denying need for advancement/progression of resistance or challenge with HEP. Good tolerance for progression of strengthening and balance exercises duing therapy session with some fatigue noted but pt requiring fewer rest breaks.    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 lymphadenopathy as well as bone metastasis diagnosed in September 2017 - currently treated with immunotherapy with Keytruda IV q 3 wks 7 & s/p palliative radiotherapy to metastatic bone lesion; hypothyroidism; HTN    PT Treatment/Interventions  Patient/family education;ADLs/Self Care Home Management;Therapeutic exercise;Neuromuscular re-education;Therapeutic activities;Functional mobility training;Gait training;Stair training;Manual techniques;Dry needling;Taping;Cryotherapy    Consulted and Agree with Plan of Care  Patient       Patient will benefit from skilled therapeutic intervention in order to improve the following deficits and impairments:  Difficulty walking, Decreased activity tolerance, Decreased strength, Impaired flexibility, Impaired sensation, Increased muscle spasms, Decreased endurance  Visit Diagnosis: Muscle weakness (generalized)  Difficulty in walking, not elsewhere classified  Unsteadiness on feet     Problem List Patient Active Problem List   Diagnosis Date Noted  . Chronic fatigue 12/24/2016  . Iron deficiency anemia due to chronic blood loss 12/10/2016  . 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 HTN (hypertension) 02/01/2016  . Encounter for antineoplastic immunotherapy 12/21/2015  .  Adenocarcinoma of right lung, stage 4 ([place]) 11/30/2015  . Encounter for antineoplastic chemotherapy 11/30/2015  . Tobacco abuse 11/10/2015  . Lung mass 10/11/2015    Percival Spanish, PT, MPT 01/28/2017, 2:25 PM  [facility] [address]  [place], [place], [address] Phone: [phone]   Fax:  [phone]  Name: Darrell Leonhardt MRN: [id number] Date of Birth: 12-Dec-1950

## 2017-01-29 ENCOUNTER — Ambulatory Visit: Payer: Medicare Other

## 2017-01-29 DIAGNOSIS — M6281 Muscle weakness (generalized): Secondary | ICD-10-CM

## 2017-01-29 DIAGNOSIS — R262 Difficulty in walking, not elsewhere classified: Secondary | ICD-10-CM

## 2017-01-29 DIAGNOSIS — R2681 Unsteadiness on feet: Secondary | ICD-10-CM | POA: Diagnosis not present

## 2017-01-29 NOTE — Therapy (Addendum)
[facility] [address]  [place], [place], [address] Phone: [phone]   Fax:  [phone]  Physical Therapy Treatment  Patient Details  Name: Brittany Mercado MRN: [id number] Date of Birth: [birth year] Referring Provider: Riki Sheer, DO   Encounter Date: 01/29/2017  PT End of Session - 01/29/17 1318    Visit Number  8    Number of Visits  12    Date for PT Re-Evaluation  02/12/17    Authorization Type  UHC Medicare & Medicaid    PT Start Time  1315    PT Stop Time  1355    PT Time Calculation (min)  40 min    Activity Tolerance  Patient tolerated treatment well    Behavior During Therapy  [facility] for tasks assessed/performed       Past Medical History:  Diagnosis Date  . Adenocarcinoma of right lung, stage 4 ([place]) 11/30/2015  . Complication of anesthesia    hard to wake up  . Eczema   . Encounter for antineoplastic chemotherapy 11/30/2015  . Heart murmur    told at age 72 but never has had any problems  . History of radiation therapy 01/26/16-02/07/16   right pelvis 30 Gy in 10 fractions  . HTN (hypertension) 02/01/2016  . 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 Iron deficiency anemia due to chronic blood loss 12/10/2016    Past Surgical History:  Procedure Laterality Date  . ABDOMINAL HYSTERECTOMY    . OOPHORECTOMY  1996   tumor removed from right ovary  . TONSILLECTOMY    . VIDEO BRONCHOSCOPY WITH ENDOBRONCHIAL NAVIGATION N/A 11/18/2015   Procedure: VIDEO BRONCHOSCOPY WITH ENDOBRONCHIAL NAVIGATION;  Surgeon: Melrose Nakayama, MD;  Location: [facility];  Service: Thoracic;  Laterality: N/A;  . VIDEO BRONCHOSCOPY WITH ENDOBRONCHIAL ULTRASOUND N/A 11/18/2015   Procedure: VIDEO BRONCHOSCOPY WITH ENDOBRONCHIAL ULTRASOUND;  Surgeon: Melrose Nakayama, MD;  Location: [facility];  Service: Thoracic;  Laterality: N/A;    There were no vitals filed for this visit.  Subjective Assessment - 01/29/17 1316     Subjective  Pt. reporting she feels her "legs are getting stronger".  Was able to walk in [place] 45 min without feeling tired.  Infusion scheduled for Monday.      Limitations  Walking    How long can you walk comfortably?  45 min     Patient Stated Goals  "legs stop freezing up"    Currently in Pain?  No/denies    Pain Score  0-No pain    Multiple Pain Sites  No                      [facility] Adult PT Treatment/Exercise - 01/29/17 1321      Lumbar Exercises: Machines for Strengthening   Cybex Leg Press  B LE's 20# x 20 reps      Lumbar Exercises: Seated   Sit to Stand  10 reps without UE support from low box with airex pad    Other Seated Lumbar Exercises  B shoulder extension with red TB closed in door seated on green p-ball x 15 reps       Knee/Hip Exercises: Aerobic   Recumbent Bike  L2 x 6 min      Knee/Hip Exercises: Standing   Heel Raises  Both;10 reps;2 sets    Heel Raises Limitations  B con/alternating single LE eccentric     Forward Lunges  Both;10 reps;3  seconds    Forward Lunges Limitations  counter support; verbal cues to maintain posterior/anterior wt. balance    Wall Squat  10 reps    Wall Squat Limitations  ~ 90 dg knees     Other Standing Knee Exercises  B side stepping & fwd monster walk - red TB at ankles x 25 feet each direction               PT Short Term Goals - 01/15/17 0956      PT SHORT TERM GOAL #1   Title  Independent with initial HEP    Status  Achieved        PT Long Term Goals - 01/07/17 0943      PT LONG TERM GOAL #1   Title  Independent with ongoing HEP    Status  On-going      PT LONG TERM GOAL #2   Title  B LE strength grossly 4/5 or greater for improved activity tolerance    Status  On-going      PT LONG TERM GOAL #3   Title  Pt will report ability to walk through [place] or grocery store w/o limitation due to "legs freezing up" or need for seated rest break     Status  On-going             Plan - 01/29/17 1319    Clinical Impression Statement  Reports no recent episodes of "legs freezing" while walking and was able to walk 45 min while shopping without need for break.  Tolerated all lumbopelvic/LE strengthening activities today in treatment well without requiring sitting rest break.  Jamiaya progressing well toward goals at this point.      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 lymphadenopathy as well as bone metastasis diagnosed in September 2017 - currently treated with immunotherapy with Keytruda IV q 3 wks 7 & s/p palliative radiotherapy to metastatic bone lesion; hypothyroidism; HTN    PT Treatment/Interventions  Patient/family education;ADLs/Self Care Home Management;Therapeutic exercise;Neuromuscular re-education;Therapeutic activities;Functional mobility training;Gait training;Stair training;Manual techniques;Dry needling;Taping;Cryotherapy    Consulted and Agree with Plan of Care  Patient       Patient will benefit from skilled therapeutic intervention in order to improve the following deficits and impairments:  Difficulty walking, Decreased activity tolerance, Decreased strength, Impaired flexibility, Impaired sensation, Increased muscle spasms, Decreased endurance  Visit Diagnosis: Muscle weakness (generalized)  Difficulty in walking, not elsewhere classified  Unsteadiness on feet     Problem List Patient Active Problem List   Diagnosis Date Noted  . Chronic fatigue 12/24/2016  . Iron deficiency anemia due to chronic blood loss 12/10/2016  . 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 HTN (hypertension) 02/01/2016  . Encounter for antineoplastic immunotherapy 12/21/2015  . Adenocarcinoma of right lung, stage 4 ([place]) 11/30/2015  . Encounter for antineoplastic chemotherapy 11/30/2015  . Tobacco abuse 11/10/2015  . Lung mass 10/11/2015    Bess Harvest,  PTA 01/29/17 2:51 PM  [facility] [address]  [place], [place], [address] Phone: [phone]   Fax:  [phone]  Name: Brittany Mercado MRN: [id number] Date of Birth: 06-Jun-1950   PHYSICAL THERAPY DISCHARGE SUMMARY  Visits from Start of Care: 8  Current functional level related to goals / functional outcomes:   Refer to above clinical impression for status as of last visit on 01/29/17. Pt failed to return in >30 days, therefore  unable to formally assess status at discharge and will proceed with discharge from PT for this episode.   Remaining deficits:   As above.   Education / Equipment:   HEP   G-Codes - 02/27/17    Functional Assessment Tool Used (Outpatient Only) Clinical judgment   Functional Limitation  Mobility: Walking and moving around    Mobility: Walking and Moving Around Goal Status [phone])  At least 20 percent but less than 40 percent impaired, limited or restricted    Mobility: Walking and Moving Around Discharge Status [phone])  At least 20 percent but less than 40 percent impaired, limited or restricted     Plan: Patient agrees to discharge.  Patient goals were partially met. Patient is being discharged due to not returning since the last visit.  ?????    Percival Spanish, PT, MPT 03/19/17, 1:04 PM  [facility] [address]  [place], [place], [address] Phone: [phone]   Fax:  [phone]

## 2017-02-04 ENCOUNTER — Ambulatory Visit: Payer: Medicare Other

## 2017-02-04 ENCOUNTER — Other Ambulatory Visit: Payer: Medicare Other

## 2017-02-04 ENCOUNTER — Ambulatory Visit: Payer: Medicare Other | Admitting: Internal Medicine

## 2017-02-05 ENCOUNTER — Ambulatory Visit: Payer: Medicare Other | Admitting: Physical Therapy

## 2017-02-06 ENCOUNTER — Telehealth: Payer: Self-pay | Admitting: Family Medicine

## 2017-02-06 NOTE — Telephone Encounter (Signed)
Copied from [place] #[id number]. Topic: Medicare AWV >> Feb 06, 2017  1:24 PM Gerrie Nordmann wrote: Reason for CRM: Spoke with Ms. Dedominicis regarding AWV. Informed patient that she is eligible for Welcome to Medicare Visit and that she has until May 2019 to schedule it. Patient stated that she will give office a call when she is ready to schedule appointment. SF

## 2017-02-07 ENCOUNTER — Other Ambulatory Visit: Payer: Self-pay | Admitting: Internal Medicine

## 2017-02-07 ENCOUNTER — Telehealth: Payer: Self-pay | Admitting: Internal Medicine

## 2017-02-07 NOTE — Telephone Encounter (Signed)
Spoke with patient re next appointment for this Saturday 12/15, then back to Mondays starting 03/04/17. Patient will get new schedule at 12/15 visit. Appointments rescheduled per 12/12 schedule messages due to weather.  Message to MM re if patient will need labs for 12/15 infusion.

## 2017-02-08 ENCOUNTER — Telehealth: Payer: Self-pay | Admitting: Internal Medicine

## 2017-02-08 ENCOUNTER — Other Ambulatory Visit (HOSPITAL_BASED_OUTPATIENT_CLINIC_OR_DEPARTMENT_OTHER): Payer: Medicare Other

## 2017-02-08 DIAGNOSIS — Z79899 Other long term (current) drug therapy: Secondary | ICD-10-CM

## 2017-02-08 DIAGNOSIS — C3431 Malignant neoplasm of lower lobe, right bronchus or lung: Secondary | ICD-10-CM | POA: Diagnosis not present

## 2017-02-08 DIAGNOSIS — R5382 Chronic fatigue, unspecified: Secondary | ICD-10-CM

## 2017-02-08 DIAGNOSIS — C3491 Malignant neoplasm of unspecified part of right bronchus or lung: Secondary | ICD-10-CM

## 2017-02-08 LAB — COMPREHENSIVE METABOLIC PANEL
ALBUMIN: 3.5 g/dL (ref 3.5–5.0)
ALK PHOS: 395 U/L — AB (ref 40–150)
ALT: 53 U/L (ref 0–55)
AST: 61 U/L — AB (ref 5–34)
Anion Gap: 10 mEq/L (ref 3–11)
BILIRUBIN TOTAL: 0.26 mg/dL (ref 0.20–1.20)
BUN: 13.3 mg/dL (ref 7.0–26.0)
CALCIUM: 9.5 mg/dL (ref 8.4–10.4)
CO2: 24 mEq/L (ref 22–29)
CREATININE: 1 mg/dL (ref 0.6–1.1)
Chloride: 106 mEq/L (ref 98–109)
EGFR: >60 mL/min/{1.73_m2} (ref >60)
Glucose: 103 mg/dl (ref 70–140)
Potassium: 4.3 mEq/L (ref 3.5–5.1)
Sodium: 140 mEq/L (ref 136–145)
Total Protein: 8 g/dL (ref 6.4–8.3)

## 2017-02-08 LAB — TSH: TSH: 0.103 m(IU)/L — ABNORMAL LOW (ref 0.308–3.960)

## 2017-02-08 LAB — CBC WITH DIFFERENTIAL/PLATELET
BASO%: 0.6 % (ref 0.0–2.0)
Basophils Absolute: 0 10*3/uL (ref 0.0–0.1)
EOS%: 6.8 % (ref 0.0–7.0)
Eosinophils Absolute: 0.5 10*3/uL (ref 0.0–0.5)
HCT: 34.9 % (ref 34.8–46.6)
HGB: 11.6 g/dL (ref 11.6–15.9)
LYMPH%: 18.2 % (ref 14.0–49.7)
MCH: 26.4 pg (ref 25.1–34.0)
MCHC: 33.2 g/dL (ref 31.5–36.0)
MCV: 79.7 fL (ref 79.5–101.0)
MONO#: 0.4 10*3/uL (ref 0.1–0.9)
MONO%: 6.1 % (ref 0.0–14.0)
NEUT%: 68.3 % (ref 38.4–76.8)
NEUTROS ABS: 4.9 10*3/uL (ref 1.5–6.5)
Platelets: 255 10*3/uL (ref 145–400)
RBC: 4.39 10*6/uL (ref 3.70–5.45)
RDW: 25.9 % — ABNORMAL HIGH (ref 11.2–14.5)
WBC: 7.2 10*3/uL (ref 3.9–10.3)
lymph#: 1.3 10*3/uL (ref 0.9–3.3)

## 2017-02-08 LAB — TECHNOLOGIST REVIEW

## 2017-02-08 NOTE — Telephone Encounter (Signed)
Spoke with patient re needing lab today for infusion tomorrow. Patient will call me back and let me know when she can get here. Per pharmacy patient will need lab.

## 2017-02-09 ENCOUNTER — Ambulatory Visit (HOSPITAL_BASED_OUTPATIENT_CLINIC_OR_DEPARTMENT_OTHER): Payer: Medicare Other

## 2017-02-09 VITALS — BP 146/55 | HR 86 | Temp 98.7°F | Resp 17 | Ht 63.0 in | Wt 101.5 lb

## 2017-02-09 DIAGNOSIS — Z5112 Encounter for antineoplastic immunotherapy: Secondary | ICD-10-CM | POA: Diagnosis not present

## 2017-02-09 DIAGNOSIS — C3431 Malignant neoplasm of lower lobe, right bronchus or lung: Secondary | ICD-10-CM

## 2017-02-09 DIAGNOSIS — C3491 Malignant neoplasm of unspecified part of right bronchus or lung: Secondary | ICD-10-CM

## 2017-02-09 MED ORDER — SODIUM CHLORIDE 0.9 % IV SOLN
Freq: Once | INTRAVENOUS | Status: AC
  Administered 2017-02-09: 12:00:00 via INTRAVENOUS

## 2017-02-09 MED ORDER — PEMBROLIZUMAB CHEMO INJECTION 100 MG/4ML
200.0000 mg | Freq: Once | INTRAVENOUS | Status: AC
  Administered 2017-02-09: 200 mg via INTRAVENOUS
  Filled 2017-02-09: qty 8

## 2017-02-09 NOTE — Patient Instructions (Signed)
[facility] Discharge Instructions for Patients Receiving Chemotherapy  Today you received the following chemotherapy agents:  Keytruda.  To help prevent nausea and vomiting after your treatment, we encourage you to take your nausea medication as directed.   If you develop nausea and vomiting that is not controlled by your nausea medication, call the clinic.   BELOW ARE SYMPTOMS THAT SHOULD BE REPORTED IMMEDIATELY:  *FEVER GREATER THAN 100.5 F  *CHILLS WITH OR WITHOUT FEVER  NAUSEA AND VOMITING THAT IS NOT CONTROLLED WITH YOUR NAUSEA MEDICATION  *UNUSUAL SHORTNESS OF BREATH  *UNUSUAL BRUISING OR BLEEDING  TENDERNESS IN MOUTH AND THROAT WITH OR WITHOUT PRESENCE OF ULCERS  *URINARY PROBLEMS  *BOWEL PROBLEMS  UNUSUAL RASH Items with * indicate a potential emergency and should be followed up as soon as possible.  Feel free to call the clinic should you have any questions or concerns. The clinic phone number is [phone].  Please show the CHEMO ALERT CARD at check-in to the Emergency Department and triage nurse.    

## 2017-02-11 ENCOUNTER — Encounter: Payer: Self-pay | Admitting: Medical Oncology

## 2017-02-11 NOTE — Progress Notes (Signed)
Per Dr [name] it is okay for pt to receive chemotherapy on Saturday dec 22 using labs from 02/08/17

## 2017-02-12 ENCOUNTER — Other Ambulatory Visit: Payer: Self-pay | Admitting: Family Medicine

## 2017-02-12 ENCOUNTER — Other Ambulatory Visit: Payer: Medicare Other

## 2017-02-12 DIAGNOSIS — M26629 Arthralgia of temporomandibular joint, unspecified side: Secondary | ICD-10-CM

## 2017-02-12 DIAGNOSIS — R0781 Pleurodynia: Secondary | ICD-10-CM

## 2017-02-14 ENCOUNTER — Telehealth: Payer: Self-pay | Admitting: Medical Oncology

## 2017-02-14 NOTE — Telephone Encounter (Signed)
Need 2019 enrollment form . Keytruda. Pt has insuracne so re enrollment is not needed.

## 2017-02-25 ENCOUNTER — Ambulatory Visit: Payer: Medicare Other

## 2017-02-25 ENCOUNTER — Other Ambulatory Visit: Payer: Medicare Other

## 2017-02-25 ENCOUNTER — Ambulatory Visit: Payer: Medicare Other | Admitting: Internal Medicine

## 2017-03-02 ENCOUNTER — Other Ambulatory Visit: Payer: Self-pay | Admitting: Internal Medicine

## 2017-03-02 DIAGNOSIS — Z5112 Encounter for antineoplastic immunotherapy: Secondary | ICD-10-CM

## 2017-03-02 DIAGNOSIS — J302 Other seasonal allergic rhinitis: Secondary | ICD-10-CM

## 2017-03-02 DIAGNOSIS — C3491 Malignant neoplasm of unspecified part of right bronchus or lung: Secondary | ICD-10-CM

## 2017-03-03 ENCOUNTER — Other Ambulatory Visit: Payer: Self-pay | Admitting: Internal Medicine

## 2017-03-04 ENCOUNTER — Inpatient Hospital Stay: Payer: Medicare Other

## 2017-03-04 ENCOUNTER — Inpatient Hospital Stay: Payer: Medicare Other | Attending: Oncology | Admitting: Oncology

## 2017-03-04 ENCOUNTER — Encounter: Payer: Self-pay | Admitting: Oncology

## 2017-03-04 ENCOUNTER — Other Ambulatory Visit: Payer: Self-pay | Admitting: *Deleted

## 2017-03-04 ENCOUNTER — Telehealth: Payer: Self-pay | Admitting: Internal Medicine

## 2017-03-04 VITALS — BP 146/54 | HR 77 | Temp 97.5°F | Resp 18 | Ht 63.0 in | Wt 102.5 lb

## 2017-03-04 DIAGNOSIS — C3491 Malignant neoplasm of unspecified part of right bronchus or lung: Secondary | ICD-10-CM

## 2017-03-04 DIAGNOSIS — C3431 Malignant neoplasm of lower lobe, right bronchus or lung: Secondary | ICD-10-CM | POA: Insufficient documentation

## 2017-03-04 DIAGNOSIS — C7951 Secondary malignant neoplasm of bone: Secondary | ICD-10-CM | POA: Insufficient documentation

## 2017-03-04 DIAGNOSIS — I10 Essential (primary) hypertension: Secondary | ICD-10-CM | POA: Diagnosis not present

## 2017-03-04 DIAGNOSIS — Z5112 Encounter for antineoplastic immunotherapy: Secondary | ICD-10-CM | POA: Diagnosis not present

## 2017-03-04 DIAGNOSIS — R739 Hyperglycemia, unspecified: Secondary | ICD-10-CM | POA: Insufficient documentation

## 2017-03-04 DIAGNOSIS — Z794 Long term (current) use of insulin: Secondary | ICD-10-CM | POA: Diagnosis not present

## 2017-03-04 DIAGNOSIS — E039 Hypothyroidism, unspecified: Secondary | ICD-10-CM | POA: Insufficient documentation

## 2017-03-04 DIAGNOSIS — R5382 Chronic fatigue, unspecified: Secondary | ICD-10-CM

## 2017-03-04 LAB — CBC WITH DIFFERENTIAL/PLATELET
ABS GRANULOCYTE: 6.8 10*3/uL — AB (ref 1.5–6.5)
BASOS ABS: 0 10*3/uL (ref 0.0–0.1)
BASOS PCT: 0 %
Eosinophils Absolute: 0.3 10*3/uL (ref 0.0–0.5)
Eosinophils Relative: 3 %
HEMATOCRIT: 35.8 % (ref 34.8–46.6)
HEMOGLOBIN: 12.2 g/dL (ref 11.6–15.9)
LYMPHS ABS: 1.5 10*3/uL (ref 0.9–3.3)
Lymphocytes Relative: 17 %
MCH: 27.7 pg (ref 25.1–34.0)
MCHC: 34.1 g/dL (ref 31.5–36.0)
MCV: 81.2 fL (ref 79.5–101.0)
Monocytes Absolute: 0.4 10*3/uL (ref 0.1–0.9)
Monocytes Relative: 5 %
NEUTROS ABS: 6.8 10*3/uL — AB (ref 1.5–6.5)
NEUTROS PCT: 75 %
Platelets: 303 10*3/uL (ref 145–400)
RBC: 4.41 MIL/uL (ref 3.70–5.45)
RDW: 16.8 % — ABNORMAL HIGH (ref 11.2–16.1)
WBC: 9.1 10*3/uL (ref 3.9–10.3)

## 2017-03-04 LAB — COMPREHENSIVE METABOLIC PANEL
ALBUMIN: 3.8 g/dL (ref 3.5–5.0)
ALT: 27 U/L (ref 0–55)
ANION GAP: 8 (ref 3–11)
AST: 34 U/L (ref 5–34)
Alkaline Phosphatase: 416 U/L — ABNORMAL HIGH (ref 40–150)
BUN: 14 mg/dL (ref 7–26)
CO2: 25 mmol/L (ref 22–29)
Calcium: 9.8 mg/dL (ref 8.4–10.4)
Chloride: 103 mmol/L (ref 98–109)
Creatinine, Ser: 1.03 mg/dL (ref 0.60–1.10)
GFR calc non Af Amer: 56 mL/min — ABNORMAL LOW (ref >60)
GLUCOSE: 103 mg/dL (ref 70–140)
POTASSIUM: 4.4 mmol/L (ref 3.3–4.7)
SODIUM: 136 mmol/L (ref 136–145)
Total Bilirubin: 0.2 mg/dL (ref 0.2–1.2)
Total Protein: 8.6 g/dL — ABNORMAL HIGH (ref 6.4–8.3)

## 2017-03-04 MED ORDER — SODIUM CHLORIDE 0.9 % IV SOLN
Freq: Once | INTRAVENOUS | Status: AC
  Administered 2017-03-04: 13:00:00 via INTRAVENOUS

## 2017-03-04 MED ORDER — SODIUM CHLORIDE 0.9 % IV SOLN
200.0000 mg | Freq: Once | INTRAVENOUS | Status: AC
  Administered 2017-03-04: 200 mg via INTRAVENOUS
  Filled 2017-03-04: qty 8

## 2017-03-04 NOTE — Assessment & Plan Note (Signed)
This is a very pleasant 67 year old African-American female with metastatic non-small cell lung cancer, adenocarcinoma with positive PDL 1 expression of 99%. She is currently undergoing treatment with Ketruda 200 mg IV every 3 weeks, status post 20 cycles. She continues to tolerate her treatment with immunotherapy fairly well. I recommended for the patient to proceed with cycle #21 today as a scheduled. The patient will have restaging CT scans of the chest, abdomen, pelvis prior to her next visit. Follow-up visit will be in 3 weeks for evaluation prior to cycle #22 of her treatment and to review her restaging CT scans.  The patient was advised to call immediately if she has any concerning symptoms in the interval. The patient voices understanding of current disease status and treatment options and is in agreement with the current care plan. All questions were answered. The patient knows to call the clinic with any problems, questions or concerns. We can certainly see the patient much sooner if necessary.

## 2017-03-04 NOTE — Telephone Encounter (Signed)
Gave avs and calendar for January - march

## 2017-03-04 NOTE — Progress Notes (Signed)
Per Karen Chafe desk RN Dr [name], ok to tx today with labs and no TSH. Pt to get TSH next appt.

## 2017-03-04 NOTE — Patient Instructions (Signed)
[facility] Discharge Instructions for Patients Receiving Chemotherapy  Today you received the following chemotherapy agents:  Keytruda.  To help prevent nausea and vomiting after your treatment, we encourage you to take your nausea medication as directed.   If you develop nausea and vomiting that is not controlled by your nausea medication, call the clinic.   BELOW ARE SYMPTOMS THAT SHOULD BE REPORTED IMMEDIATELY:  *FEVER GREATER THAN 100.5 F  *CHILLS WITH OR WITHOUT FEVER  NAUSEA AND VOMITING THAT IS NOT CONTROLLED WITH YOUR NAUSEA MEDICATION  *UNUSUAL SHORTNESS OF BREATH  *UNUSUAL BRUISING OR BLEEDING  TENDERNESS IN MOUTH AND THROAT WITH OR WITHOUT PRESENCE OF ULCERS  *URINARY PROBLEMS  *BOWEL PROBLEMS  UNUSUAL RASH Items with * indicate a potential emergency and should be followed up as soon as possible.  Feel free to call the clinic should you have any questions or concerns. The clinic phone number is [phone].  Please show the CHEMO ALERT CARD at check-in to the Emergency Department and triage nurse.    

## 2017-03-04 NOTE — Progress Notes (Signed)
[facility] OFFICE PROGRESS NOTE  [name], DO [address] [place] [address]  DIAGNOSIS: Stage IV (T1b, N2, M1 B) non-small cell lung cancer, adenocarcinoma with positive PDL 1 expression (99%), presented with right lower lobe lung nodule with metastatic right hilar and subcarinal lymphadenopathy as well as bone metastasis diagnosed in September 2017.  Genomic Alterations Identified? NF1 [id number] ARID1B [id number] PBRM1 [id number] TP53 [id number] Additional Findings? Microsatellite status MS-Stable Tumor Mutation Burden TMB-Intermediate; 7 Muts/Mb Additional Disease-relevant Genes with No Reportable Alterations Identified? EGFR KRAS ALK BRAF MET ERBB2 RET ROS1  PRIOR THERAPY: Palliative radiotherapy to the metastatic bone lesion under the care of Dr. KinaSondra ComeURRENT THERAPY: First-line treatment with immunotherapy with Ketruda (pembrolizumab) 200 mg IV every 3 weeks status post 20 cycles. First cycle was given 12/21/2015.  INTERVAL HISTORY: HeleBethel Siroisy67. female returns for routine follow-up visit accompanied by her friend.  The patient has no complaints today.  She continues to tolerate her KeytBeryle Flockrly well.  She denies fevers and chills.  Denies chest pain, shortness breath, cough, hemoptysis.  Denies nausea, vomiting, constipation, diarrhea.  She has been gaining back some of her lost weight.  Denies night sweats.  Patient is here for evaluation prior to starting cycle #21.  MEDICAL HISTORY: Past Medical History:  Diagnosis Date  . Adenocarcinoma of right lung, stage 4 (HCC)[place]/05/2015  . Complication of anesthesia    hard to wake up  . Eczema   . Encounter for antineoplastic chemotherapy 11/30/2015  . Heart murmur    told at age 67 b41 never has had any problems  . History of radiation therapy 01/26/16-02/07/16   right pelvis 30 Gy in 10 fractions  . HTN (hypertension) 02/01/2016  . Hypothyroidism (acquired)  02/22/2016  . Iron deficiency anemia due to chronic blood loss 12/10/2016    ALLERGIES:  is allergic to penicillins.  MEDICATIONS:  Current Outpatient Medications  Medication Sig Dispense Refill  . aspirin 325 MG tablet Take 325 mg by mouth daily.    . cetirizine (ZYRTEC) 10 MG tablet TAKE 1 TABLET(10 MG) BY MOUTH DAILY 30 tablet 0  . hydrocortisone cream 0.5 % Apply 1 application topically 2 (two) times daily. 30 g 2  . levothyroxine (SYNTHROID, LEVOTHROID) 100 MCG tablet TAKE 1 TABLET(100 MCG) BY MOUTH DAILY BEFORE BREAKFAST 30 tablet 0  . levothyroxine (SYNTHROID, LEVOTHROID) 50 MCG tablet TAKE 1 TABLET(50 MCG) BY MOUTH DAILY BEFORE BREAKFAST 90 tablet 2  . meloxicam (MOBIC) 7.5 MG tablet TAKE 1 TABLET(7.5 MG) BY MOUTH DAILY 90 tablet 0  . Multiple Vitamin (MULTIVITAMIN) tablet Take 1 tablet by mouth daily.     Current Facility-Administered Medications  Medication Dose Route Frequency Provider Last Rate Last Dose  . 0.9 %  sodium chloride infusion   Intravenous Once LemmLevin Erp  [place]  . 0.9 %  sodium chloride infusion   Intravenous Once LemmLevin Erp       Facility-Administered Medications Ordered in Other Visits  Medication Dose Route Frequency Provider Last Rate Last Dose  . pembrolizumab (KEYTRUDA) 200 mg in sodium chloride 0.9 % 50 mL chemo infusion  200 mg Intravenous Once MohaCurt Bears        SURGICAL HISTORY:  Past Surgical History:  Procedure Laterality Date  . ABDOMINAL HYSTERECTOMY    . OOPHORECTOMY  1996   tumor removed from right ovary  . TONSILLECTOMY    . VIDEO BRONCHOSCOPY WITH  ENDOBRONCHIAL NAVIGATION N/A 11/18/2015   Procedure: VIDEO BRONCHOSCOPY WITH ENDOBRONCHIAL NAVIGATION;  Surgeon: Melrose Nakayama, MD;  Location: [place];  Service: Thoracic;  Laterality: N/A;  . VIDEO BRONCHOSCOPY WITH ENDOBRONCHIAL ULTRASOUND N/A 11/18/2015   Procedure: VIDEO BRONCHOSCOPY WITH ENDOBRONCHIAL ULTRASOUND;  Surgeon: Melrose Nakayama, MD;   Location: [place];  Service: Thoracic;  Laterality: N/A;    REVIEW OF SYSTEMS:   Review of Systems  Constitutional: Negative for appetite change, chills, fatigue, fever and unexpected weight change.  HENT:   Negative for mouth sores, nosebleeds, sore throat and trouble swallowing.   Eyes: Negative for eye problems and icterus.  Respiratory: Negative for cough, hemoptysis, shortness of breath and wheezing.   Cardiovascular: Negative for chest pain and leg swelling.  Gastrointestinal: Negative for abdominal pain, constipation, diarrhea, nausea and vomiting.  Genitourinary: Negative for bladder incontinence, difficulty urinating, dysuria, frequency and hematuria.   Musculoskeletal: Negative for back pain, gait problem, neck pain and neck stiffness.  Skin: Negative for itching and rash.  Neurological: Negative for dizziness, extremity weakness, gait problem, headaches, light-headedness and seizures.  Hematological: Negative for adenopathy. Does not bruise/bleed easily.  Psychiatric/Behavioral: Negative for confusion, depression and sleep disturbance. The patient is not nervous/anxious.     PHYSICAL EXAMINATION:  Blood pressure (!) 146/54, pulse 77, temperature (!) 97.5 F (36.4 C), temperature source Oral, resp. rate 18, height _0  (1.6 m), weight 102 lb 8 oz (46.5 kg), SpO2 100 %.  ECOG PERFORMANCE STATUS: 1 - Symptomatic but completely ambulatory  Physical Exam  Constitutional: Oriented to person, place, and time and well-developed, well-nourished, and in no distress. No distress.  HENT:  Head: Normocephalic and atraumatic.  Mouth/Throat: Oropharynx is clear and moist. No oropharyngeal exudate.  Eyes: Conjunctivae are normal. Right eye exhibits no discharge. Left eye exhibits no discharge. No scleral icterus.  Neck: Normal range of motion. Neck supple.  Cardiovascular: Normal rate, regular rhythm, normal heart sounds and intact distal pulses.   Pulmonary/Chest: Effort normal and  breath sounds normal. No respiratory distress. No wheezes. No rales.  Abdominal: Soft. Bowel sounds are normal. Exhibits no distension and no mass. There is no tenderness.  Musculoskeletal: Normal range of motion. Exhibits no edema.  Lymphadenopathy:    No cervical adenopathy.  Neurological: Alert and oriented to person, place, and time. Exhibits normal muscle tone. Gait normal. Coordination normal.  Skin: Skin is warm and dry. No rash noted. Not diaphoretic. No erythema. No pallor.  Psychiatric: Mood, memory and judgment normal.  Vitals reviewed.  LABORATORY DATA: Lab Results  Component Value Date   WBC 9.1 03/04/2017   HGB 12.2 03/04/2017   HCT 35.8 03/04/2017   MCV 81.2 03/04/2017   PLT 303 03/04/2017      Chemistry      Component Value Date/Time   NA 136 03/04/2017 1120   NA 140 02/08/2017 1241   K 4.4 03/04/2017 1120   K 4.3 02/08/2017 1241   CL 103 03/04/2017 1120   CO2 25 03/04/2017 1120   CO2 24 02/08/2017 1241   BUN 14 03/04/2017 1120   BUN 13.3 02/08/2017 1241   CREATININE 1.03 03/04/2017 1120   CREATININE 1.0 02/08/2017 1241      Component Value Date/Time   CALCIUM 9.8 03/04/2017 1120   CALCIUM 9.5 02/08/2017 1241   ALKPHOS 416 (H) 03/04/2017 1120   ALKPHOS 395 (H) 02/08/2017 1241   AST 34 03/04/2017 1120   AST 61 (H) 02/08/2017 1241   ALT 27 03/04/2017 1120  ALT 53 02/08/2017 1241   BILITOT 0.2 03/04/2017 1120   BILITOT 0.26 02/08/2017 1241       RADIOGRAPHIC STUDIES:  No results found.   ASSESSMENT/PLAN:  Adenocarcinoma of right lung, stage 4 (HCC) This is a very pleasant 67 year old African-American female with metastatic non-small cell lung cancer, adenocarcinoma with positive PDL 1 expression of 99%. She is currently undergoing treatment with Ketruda 200 mg IV every 3 weeks, status post 20 cycles. She continues to tolerate her treatment with immunotherapy fairly well. I recommended for the patient to proceed with cycle #21 today as a  scheduled. The patient will have restaging CT scans of the chest, abdomen, pelvis prior to her next visit. Follow-up visit will be in 3 weeks for evaluation prior to cycle #22 of her treatment and to review her restaging CT scans.  The patient was advised to call immediately if she has any concerning symptoms in the interval. The patient voices understanding of current disease status and treatment options and is in agreement with the current care plan. All questions were answered. The patient knows to call the clinic with any problems, questions or concerns. We can certainly see the patient much sooner if necessary.  Orders Placed This Encounter  Procedures  . CT CHEST W CONTRAST    Standing Status:   Future    Standing Expiration Date:   03/04/2018    Order Specific Question:   If indicated for the ordered procedure, I authorize the administration of contrast media per Radiology protocol    Answer:   Yes    Order Specific Question:   Preferred imaging location?    Answer:   [facility]    Order Specific Question:   Radiology Contrast Protocol - do NOT remove file path    Answer:   [email or web address]    Order Specific Question:   Reason for Exam additional comments    Answer:   Stage IV lung cancer. Restaging.  Marland Kitchen CT ABDOMEN PELVIS W CONTRAST    Standing Status:   Future    Standing Expiration Date:   03/04/2018    Order Specific Question:   If indicated for the ordered procedure, I authorize the administration of contrast media per Radiology protocol    Answer:   Yes    Order Specific Question:   Preferred imaging location?    Answer:   [facility]    Order Specific Question:   Radiology Contrast Protocol - do NOT remove file path    Answer:   [email or web address]    Order Specific Question:   Reason for Exam additional comments    Answer:   Stage IV lung cancer. Restaging.     Mikey Bussing, DNP, AGPCNP-BC,  AOCNP 03/04/17

## 2017-03-04 NOTE — Progress Notes (Signed)
Pt TSH clotted with prior lab draw. Reviewed with MD, do not restick pt for TSH. Pt will return on 03/25/17

## 2017-03-13 ENCOUNTER — Ambulatory Visit (INDEPENDENT_AMBULATORY_CARE_PROVIDER_SITE_OTHER): Payer: Medicare Other | Admitting: Family Medicine

## 2017-03-13 ENCOUNTER — Encounter: Payer: Self-pay | Admitting: Family Medicine

## 2017-03-13 VITALS — BP 104/64 | HR 93 | Temp 98.0°F | Ht 63.0 in | Wt 100.1 lb

## 2017-03-13 DIAGNOSIS — R29898 Other symptoms and signs involving the musculoskeletal system: Secondary | ICD-10-CM

## 2017-03-13 DIAGNOSIS — R0781 Pleurodynia: Secondary | ICD-10-CM

## 2017-03-13 DIAGNOSIS — M26629 Arthralgia of temporomandibular joint, unspecified side: Secondary | ICD-10-CM | POA: Diagnosis not present

## 2017-03-13 DIAGNOSIS — R5381 Other malaise: Secondary | ICD-10-CM

## 2017-03-13 DIAGNOSIS — E039 Hypothyroidism, unspecified: Secondary | ICD-10-CM | POA: Diagnosis not present

## 2017-03-13 LAB — T4, FREE: Free T4: 1.11 ng/dL (ref 0.60–1.60)

## 2017-03-13 LAB — TSH: TSH: 0.83 u[IU]/mL (ref 0.35–4.50)

## 2017-03-13 MED ORDER — MELOXICAM 7.5 MG PO TABS: ORAL_TABLET | ORAL | 1 refills | Status: DC

## 2017-03-13 NOTE — Patient Instructions (Signed)
Give [place] 2-3 business days to get the results of your labs back. If labs are normal, you will likely receive a letter in the mail unless you have MyChart. This can take longer than 2-3 business days.   If anything abnormal on your labs comes up, we will see you in 6 weeks rather than 6 months.   Let us know if you need anything.

## 2017-03-13 NOTE — Progress Notes (Signed)
Pre visit review using our clinic review tool, if applicable. No additional management support is needed unless otherwise documented below in the visit note. 

## 2017-03-13 NOTE — Progress Notes (Signed)
Chief Complaint  Patient presents with  . Follow-up    6 week    Subjective: Hypothyroidism Patient is a 67 year old BF who presents for follow-up of hypothyroidism.  Reports compliance with medication-Synthroid 100 mcg daily. Current symptoms include: none Denies: fatigue, weight gain, feeling cold and cold intolerance, constipation, swelling, losing hair, anxiousness, palpitations, sweating and weight loss She believes her dose should be unchanged  Weakness in both legs, had been seeing PT, needs a new referral.   ROS: Heart: Denies chest pain  Lungs: Denies SOB    Past Medical History:  Diagnosis Date  . Adenocarcinoma of right lung, stage 4 ([place]) 11/30/2015  . Complication of anesthesia    hard to wake up  . Eczema   . Encounter for antineoplastic chemotherapy 11/30/2015  . Heart murmur    told at age 67 but never has had any problems  . History of radiation therapy 01/26/16-02/07/16   right pelvis 30 Gy in 10 fractions  . HTN (hypertension) 02/01/2016  . 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 Iron deficiency anemia due to chronic blood loss 12/10/2016    Objective: BP 104/64 (BP Location: Left Arm, Patient Position: Sitting, Cuff Size: Normal)   Pulse 93   Temp 98 F (36.7 C) (Oral)   Ht 5\' 3"  (1.6 m)   Wt 100 lb 2 oz (45.4 kg)   SpO2 98%   BMI 17.74 kg/m  General: Awake, appears stated age Neck: Thyroid not enlarged, no nodules appreciated, no asymmetry Heart: RRR, no murmurs Lungs: CTAB, no rales, wheezes or rhonchi. No accessory muscle use Psych: Age appropriate judgment and insight, normal affect and mood  Assessment and Plan: Hypothyroidism (acquired) - Plan: T4, free, TSH  Rib pain on right side - Plan: meloxicam (MOBIC) 7.5 MG tablet  TMJ syndrome - Plan: meloxicam (MOBIC) 7.5 MG tablet  Weakness of both lower extremities - Plan: Ambulatory referral to Physical Therapy  Physical deconditioning - Plan: Ambulatory referral to Physical  Therapy  Orders as above. OK to refill NSAID and refer to PT in the setting of physical deconditioning. F/u in 6 mo pending labs.  The patient voiced understanding and agreement to the plan.  [place], DO 03/13/17  10:11 AM

## 2017-03-18 ENCOUNTER — Ambulatory Visit: Payer: Medicare Other

## 2017-03-18 ENCOUNTER — Ambulatory Visit: Payer: Medicare Other | Admitting: Internal Medicine

## 2017-03-18 ENCOUNTER — Other Ambulatory Visit: Payer: Medicare Other

## 2017-03-21 ENCOUNTER — Ambulatory Visit: Payer: Medicare Other | Admitting: Physical Therapy

## 2017-03-21 ENCOUNTER — Ambulatory Visit (INDEPENDENT_AMBULATORY_CARE_PROVIDER_SITE_OTHER): Payer: Medicare Other | Admitting: Family Medicine

## 2017-03-21 ENCOUNTER — Encounter: Payer: Self-pay | Admitting: Family Medicine

## 2017-03-21 VITALS — BP 102/60 | HR 76 | Temp 97.7°F | Ht 63.0 in | Wt 97.5 lb

## 2017-03-21 DIAGNOSIS — B373 Candidiasis of vulva and vagina: Secondary | ICD-10-CM

## 2017-03-21 DIAGNOSIS — M26629 Arthralgia of temporomandibular joint, unspecified side: Secondary | ICD-10-CM

## 2017-03-21 DIAGNOSIS — K2900 Acute gastritis without bleeding: Secondary | ICD-10-CM

## 2017-03-21 DIAGNOSIS — R0789 Other chest pain: Secondary | ICD-10-CM | POA: Insufficient documentation

## 2017-03-21 DIAGNOSIS — R0781 Pleurodynia: Secondary | ICD-10-CM | POA: Diagnosis not present

## 2017-03-21 DIAGNOSIS — B3731 Acute candidiasis of vulva and vagina: Secondary | ICD-10-CM

## 2017-03-21 MED ORDER — ONDANSETRON HCL 4 MG PO TABS: 4.0000 mg | ORAL_TABLET | Freq: Three times a day (TID) | ORAL | 0 refills | Status: DC | PRN

## 2017-03-21 MED ORDER — FLUCONAZOLE 150 MG PO TABS: 150.0000 mg | ORAL_TABLET | Freq: Once | ORAL | 0 refills | Status: AC

## 2017-03-21 MED ORDER — DICYCLOMINE HCL 10 MG PO CAPS: 10.0000 mg | ORAL_CAPSULE | Freq: Three times a day (TID) | ORAL | 0 refills | Status: DC

## 2017-03-21 MED ORDER — MELOXICAM 7.5 MG PO TABS: ORAL_TABLET | ORAL | 1 refills | Status: DC

## 2017-03-21 NOTE — Progress Notes (Signed)
Chief Complaint  Patient presents with  . Emesis  . Generalized Body Aches     Subjective Brittany Mercado is a 67 y.o. female who presents with vomiting and nausea. Symptoms began 1 day ago. Patient has cramping Patient denies diarrhea, fever and constipation Evaluation to date: has taken no medicines Sick contacts: none known   R jaw pain continues. She has a hx of stage IV lung cancer followed by oncology. It was dx'd as TMJ, she lost her dentures.  Thus, she does not chew gum or consume chewy foods.  Denies any injury.  She brought this up to the oncology team and they do not believe it is related to a carcinoma.  Her jaw does not catch or lock.  She also has right-sided pain and right shoulder pain.  This is persisted over the same amount of time without any injury or change in activity.  Coincidentally, the right side of the lung is a same laterality that is affected by the cancer.  She was prescribed a low-dose of meloxicam that has been helpful.  She asked for a refill and pharmacy called her to pick it up but it was not there when she arrived.  She would like a refill today.  No bruising, skin changes, redness, or swelling.  Lastly, she brings up that she thinks she has a yeast infection.  She has been having a brownish discharge that smells malodorous.  No new sexual partners or urinary complaints.  No bleeding or pain otherwise.  She is having itching which makes her think she has a yeast infection, similar presentation.  Past Medical History:  Diagnosis Date  . Adenocarcinoma of right lung, stage 4 ([place]) 11/30/2015  . Complication of anesthesia    hard to wake up  . Eczema   . Encounter for antineoplastic chemotherapy 11/30/2015  . Heart murmur    told at age 61 but never has had any problems  . History of radiation therapy 01/26/16-02/07/16   right pelvis 30 Gy in 10 fractions  . HTN (hypertension) 02/01/2016  . 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 Iron deficiency anemia due  to chronic blood loss 12/10/2016   Past Surgical History:  Procedure Laterality Date  . ABDOMINAL HYSTERECTOMY    . OOPHORECTOMY  1996   tumor removed from right ovary  . TONSILLECTOMY    . VIDEO BRONCHOSCOPY WITH ENDOBRONCHIAL NAVIGATION N/A 11/18/2015   Procedure: VIDEO BRONCHOSCOPY WITH ENDOBRONCHIAL NAVIGATION;  Surgeon: Melrose Nakayama, MD;  Location: [place];  Service: Thoracic;  Laterality: N/A;  . VIDEO BRONCHOSCOPY WITH ENDOBRONCHIAL ULTRASOUND N/A 11/18/2015   Procedure: VIDEO BRONCHOSCOPY WITH ENDOBRONCHIAL ULTRASOUND;  Surgeon: Melrose Nakayama, MD;  Location: [place];  Service: Thoracic;  Laterality: N/A;   Current Outpatient Medications on File Prior to Visit  Medication Sig Dispense Refill  . aspirin 325 MG tablet Take 325 mg by mouth daily.    . cetirizine (ZYRTEC) 10 MG tablet TAKE 1 TABLET(10 MG) BY MOUTH DAILY 30 tablet 0  . hydrocortisone cream 0.5 % Apply 1 application topically 2 (two) times daily. 30 g 2  . levothyroxine (SYNTHROID, LEVOTHROID) 100 MCG tablet TAKE 1 TABLET(100 MCG) BY MOUTH DAILY BEFORE BREAKFAST 30 tablet 0  . Multiple Vitamin (MULTIVITAMIN) tablet Take 1 tablet by mouth daily.     Allergies  Allergen Reactions  . Penicillins Swelling    SWELLING REACTION UNSPECIFIED  Has patient had a PCN reaction causing immediate rash, facial/tongue/throat swelling, SOB or lightheadedness with hypotension: Yes Has  patient had a PCN reaction causing severe rash involving mucus membranes or skin necrosis: No Has patient had a PCN reaction that required hospitalization No Has patient had a PCN reaction occurring within the last 10 years: No If all of the above answers are "NO", then may proceed with Cephalosporin use.    Review of Systems Constitutional:  No fevers or chills Ear/Nose/Mouth/Throat:  No red eyes Gastrointestinal:  As noted in the HPI Integumentary (Skin/Breast): no rash  Exam BP 102/60 (BP Location: Left Arm, Patient Position: Sitting,  Cuff Size: Normal)   Pulse 76   Temp 97.7 F (36.5 C) (Oral)   Ht 5\' 3"  (1.6 m)   Wt 97 lb 8 oz (44.2 kg)   SpO2 93%   BMI 17.27 kg/m  General:  well developed, well hydrated, in no apparent distress Skin:  warm, no pallor or diaphoresis, no rashes Throat/Pharynx:  lips and gingiva without lesion; tongue and uvula midline; non-inflamed pharynx; no exudates or postnasal drainage Neck: neck supple without adenopathy, thyromegaly, or masses Lungs:  clear to auscultation, breath sounds equal bilaterally, no respiratory distress, no wheezes Cardio:  regular rate and rhythm without murmurs Abdomen:  abdomen soft, nontender; bowel sounds normal; no masses or organomegaly MSK: TTP over R TMJ w/o clicking or locking, TTP over R rib cage r3-7 Extremities:  no clubbing, cyanosis, or edema Psych: Appropriate judgement/insight  Assessment and Plan  Acute gastritis without hemorrhage, unspecified gastritis type - Plan: dicyclomine (BENTYL) 10 MG capsule, ondansetron (ZOFRAN) 4 MG tablet  Rib pain on right side - Plan: meloxicam (MOBIC) 7.5 MG tablet  TMJ syndrome - Plan: meloxicam (MOBIC) 7.5 MG tablet  Yeast vaginitis - Plan: fluconazole (DIFLUCAN) 150 MG tablet  Orders as above. Avoid aggravating foods, discussed BRAT diet. Anti-inflammatory for symptomatic control.  I am reassured that the oncology team does not believe this is related to her cancer.  If things do not improve, I would consider getting an x-ray to rule out any abnormal lesions or pathologic fractures given her fragility and medical history. F/u in 4 days, if d/c no better, will collect a urine sample and think about empirically treating for bacterial vaginosis. The patient voiced understanding and agreement to the plan.  [place], DO 03/21/17  4:02 PM

## 2017-03-21 NOTE — Progress Notes (Signed)
Pre visit review using our clinic review tool, if applicable. No additional management support is needed unless otherwise documented below in the visit note. 

## 2017-03-21 NOTE — Patient Instructions (Signed)
Keep pushing lots of fluids. Wash your hands frequently.  If the discharge is not improved by Monday, I want you to keep your appointment. If it gets better, cancel it.   Let us know if you need anything.

## 2017-03-22 ENCOUNTER — Ambulatory Visit (HOSPITAL_COMMUNITY)
Admission: RE | Admit: 2017-03-22 | Discharge: 2017-03-22 | Disposition: A | Discharge status: Home / Self Care | Payer: Medicare Other | Source: Ambulatory Visit | Attending: Oncology | Admitting: Oncology

## 2017-03-22 DIAGNOSIS — C3490 Malignant neoplasm of unspecified part of unspecified bronchus or lung: Secondary | ICD-10-CM | POA: Diagnosis not present

## 2017-03-22 DIAGNOSIS — I700 Atherosclerosis of aorta: Secondary | ICD-10-CM | POA: Diagnosis not present

## 2017-03-22 DIAGNOSIS — J439 Emphysema, unspecified: Secondary | ICD-10-CM | POA: Insufficient documentation

## 2017-03-22 DIAGNOSIS — E279 Disorder of adrenal gland, unspecified: Secondary | ICD-10-CM | POA: Insufficient documentation

## 2017-03-22 DIAGNOSIS — C3491 Malignant neoplasm of unspecified part of right bronchus or lung: Secondary | ICD-10-CM | POA: Diagnosis not present

## 2017-03-22 DIAGNOSIS — K862 Cyst of pancreas: Secondary | ICD-10-CM | POA: Insufficient documentation

## 2017-03-22 MED ORDER — IOPAMIDOL (ISOVUE-300) INJECTION 61%
100.0000 mL | Freq: Once | INTRAVENOUS | Status: AC | PRN
  Administered 2017-03-22: 80 mL via INTRAVENOUS

## 2017-03-22 MED ORDER — IOPAMIDOL (ISOVUE-300) INJECTION 61%
INTRAVENOUS | Status: AC
  Filled 2017-03-22: qty 100

## 2017-03-25 ENCOUNTER — Inpatient Hospital Stay: Payer: Medicare Other

## 2017-03-25 ENCOUNTER — Ambulatory Visit: Payer: Self-pay | Admitting: *Deleted

## 2017-03-25 ENCOUNTER — Encounter: Payer: Self-pay | Admitting: Internal Medicine

## 2017-03-25 ENCOUNTER — Encounter: Payer: Self-pay | Admitting: Family Medicine

## 2017-03-25 ENCOUNTER — Telehealth: Payer: Self-pay | Admitting: Internal Medicine

## 2017-03-25 ENCOUNTER — Inpatient Hospital Stay (HOSPITAL_BASED_OUTPATIENT_CLINIC_OR_DEPARTMENT_OTHER): Payer: Medicare Other | Admitting: Internal Medicine

## 2017-03-25 ENCOUNTER — Ambulatory Visit (INDEPENDENT_AMBULATORY_CARE_PROVIDER_SITE_OTHER): Payer: Medicare Other | Admitting: Family Medicine

## 2017-03-25 VITALS — HR 82

## 2017-03-25 VITALS — BP 108/60 | HR 83 | Temp 97.5°F | Ht 63.0 in | Wt 98.4 lb

## 2017-03-25 VITALS — BP 144/59 | HR 44 | Temp 97.6°F | Resp 18 | Ht 63.0 in | Wt 99.5 lb

## 2017-03-25 DIAGNOSIS — R739 Hyperglycemia, unspecified: Secondary | ICD-10-CM | POA: Diagnosis not present

## 2017-03-25 DIAGNOSIS — C3491 Malignant neoplasm of unspecified part of right bronchus or lung: Secondary | ICD-10-CM

## 2017-03-25 DIAGNOSIS — E039 Hypothyroidism, unspecified: Secondary | ICD-10-CM | POA: Diagnosis not present

## 2017-03-25 DIAGNOSIS — Z5112 Encounter for antineoplastic immunotherapy: Secondary | ICD-10-CM | POA: Diagnosis not present

## 2017-03-25 DIAGNOSIS — I10 Essential (primary) hypertension: Secondary | ICD-10-CM | POA: Diagnosis not present

## 2017-03-25 DIAGNOSIS — Z794 Long term (current) use of insulin: Secondary | ICD-10-CM | POA: Diagnosis not present

## 2017-03-25 DIAGNOSIS — C7951 Secondary malignant neoplasm of bone: Secondary | ICD-10-CM | POA: Diagnosis not present

## 2017-03-25 DIAGNOSIS — C3431 Malignant neoplasm of lower lobe, right bronchus or lung: Secondary | ICD-10-CM

## 2017-03-25 DIAGNOSIS — R5382 Chronic fatigue, unspecified: Secondary | ICD-10-CM

## 2017-03-25 LAB — CBC WITH DIFFERENTIAL/PLATELET
BASOS ABS: 0.1 10*3/uL (ref 0.0–0.1)
BASOS PCT: 1 %
EOS ABS: 0.4 10*3/uL (ref 0.0–0.5)
Eosinophils Relative: 4 %
HEMATOCRIT: 36.2 % (ref 34.8–46.6)
HEMOGLOBIN: 12.3 g/dL (ref 11.6–15.9)
Lymphocytes Relative: 11 %
Lymphs Abs: 1 10*3/uL (ref 0.9–3.3)
MCH: 27.9 pg (ref 25.1–34.0)
MCHC: 33.9 g/dL (ref 31.5–36.0)
MCV: 82.1 fL (ref 79.5–101.0)
MONO ABS: 0.5 10*3/uL (ref 0.1–0.9)
MONOS PCT: 5 %
NEUTROS ABS: 7 10*3/uL — AB (ref 1.5–6.5)
NEUTROS PCT: 79 %
Platelets: 255 10*3/uL (ref 145–400)
RBC: 4.41 MIL/uL (ref 3.70–5.45)
RDW: 13.2 % (ref 11.2–16.1)
WBC: 8.9 10*3/uL (ref 3.9–10.3)

## 2017-03-25 LAB — COMPREHENSIVE METABOLIC PANEL
ALK PHOS: 352 U/L — AB (ref 40–150)
ALT: 15 U/L (ref 0–55)
ANION GAP: 10 (ref 3–11)
AST: 22 U/L (ref 5–34)
Albumin: 3.4 g/dL — ABNORMAL LOW (ref 3.5–5.0)
BILIRUBIN TOTAL: 0.2 mg/dL (ref 0.2–1.2)
BUN: 9 mg/dL (ref 7–26)
CALCIUM: 9.4 mg/dL (ref 8.4–10.4)
CO2: 25 mmol/L (ref 22–29)
CREATININE: 1.23 mg/dL — AB (ref 0.60–1.10)
Chloride: 98 mmol/L (ref 98–109)
GFR calc non Af Amer: 45 mL/min — ABNORMAL LOW (ref >60)
GFR, EST AFRICAN AMERICAN: 52 mL/min — AB (ref >60)
Glucose, Bld: 402 mg/dL — ABNORMAL HIGH (ref 70–140)
Potassium: 4.2 mmol/L (ref 3.3–4.7)
Sodium: 133 mmol/L — ABNORMAL LOW (ref 136–145)
TOTAL PROTEIN: 8.1 g/dL (ref 6.4–8.3)

## 2017-03-25 LAB — TSH: TSH: 1.643 u[IU]/mL (ref 0.308–3.960)

## 2017-03-25 MED ORDER — SODIUM CHLORIDE 0.9 % IV SOLN
200.0000 mg | Freq: Once | INTRAVENOUS | Status: AC
  Administered 2017-03-25: 200 mg via INTRAVENOUS
  Filled 2017-03-25: qty 8

## 2017-03-25 MED ORDER — INSULIN REGULAR HUMAN 100 UNIT/ML IJ SOLN
15.0000 [IU] | Freq: Once | INTRAMUSCULAR | Status: AC
  Administered 2017-03-25: 15 [IU] via SUBCUTANEOUS
  Filled 2017-03-25: qty 0.15

## 2017-03-25 MED ORDER — SODIUM CHLORIDE 0.9 % IV SOLN
Freq: Once | INTRAVENOUS | Status: AC
  Administered 2017-03-25: 11:00:00 via INTRAVENOUS

## 2017-03-25 NOTE — Progress Notes (Signed)
[facility] [phone]   [phone]  OFFICE PROGRESS NOTE  [name], DO [address] [place] [address]  DIAGNOSIS: Stage IV (T1b, N2, M1 B) non-small cell lung cancer, adenocarcinoma with positive PDL 1 expression (99%), presented with right lower lobe lung nodule with metastatic right hilar and subcarinal lymphadenopathy as well as bone metastasis diagnosed in September 2017.  Genomic Alterations Identified? NF1 G311f*9 ARID1B [id number] PBRM1 [id number] TP53 [id number] Additional Findings? Microsatellite status MS-Stable Tumor Mutation Burden TMB-Intermediate; 7 Muts/Mb Additional Disease-relevant Genes with No Reportable Alterations Identified? EGFR KRAS ALK BRAF MET ERBB2 RET ROS1  PRIOR THERAPY: Palliative radiotherapy to the metastatic bone lesion under the care of Dr. KinaSondra ComeURRENT THERAPY: First-line treatment with immunotherapy with Ketruda (pembrolizumab) 200 mg IV every 3 weeks status post 21 cycles. First cycle was given 12/21/2015.  INTERVAL HISTORY: Brittany Aguinaldoy12. female returns to the clinic today for follow-up visit accompanied by her sister.  The patient is feeling fine today with no specific complaints except for lack of appetite.  She has nausea last week and she was seen by her primary care physician and was given prescription for Zofran.  She denied having any significant weight loss or night sweats.  She has no chest pain, shortness of breath, cough or hemoptysis.  She has no fever or chills.  She had repeat CT scan of the chest, abdomen and pelvis performed recently and she is here for evaluation and discussion of her scan results.   MEDICAL HISTORY: Past Medical History:  Diagnosis Date  . Adenocarcinoma of right lung, stage 4 (HCC)[place]/05/2015  . Complication of anesthesia    hard to wake up  . Eczema   . Encounter for antineoplastic chemotherapy 11/30/2015  . Heart  murmur    told at age 49 b16 never has had any problems  . History of radiation therapy 01/26/16-02/07/16   right pelvis 30 Gy in 10 fractions  . HTN (hypertension) 02/01/2016  . 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 Iron deficiency anemia due to chronic blood loss 12/10/2016    ALLERGIES:  is allergic to penicillins.  MEDICATIONS:  Current Outpatient Medications  Medication Sig Dispense Refill  . aspirin 325 MG tablet Take 325 mg by mouth daily.    . cetirizine (ZYRTEC) 10 MG tablet TAKE 1 TABLET(10 MG) BY MOUTH DAILY 30 tablet 0  . dicyclomine (BENTYL) 10 MG capsule Take 1 capsule (10 mg total) by mouth 4 (four) times daily -  before meals and at bedtime. 30 capsule 0  . hydrocortisone cream 0.5 % Apply 1 application topically 2 (two) times daily. 30 g 2  . levothyroxine (SYNTHROID, LEVOTHROID) 100 MCG tablet TAKE 1 TABLET(100 MCG) BY MOUTH DAILY BEFORE BREAKFAST 30 tablet 0  . meloxicam (MOBIC) 7.5 MG tablet TAKE 1 TABLET(7.5 MG) BY MOUTH DAILY 90 tablet 1  . Multiple Vitamin (MULTIVITAMIN) tablet Take 1 tablet by mouth daily.    . ondansetron (ZOFRAN) 4 MG tablet Take 1 tablet (4 mg total) by mouth every 8 (eight) hours as needed. 20 tablet 0   Current Facility-Administered Medications  Medication Dose Route Frequency Provider Last Rate Last Dose  . 0.9 %  sodium chloride infusion   Intravenous Once [place]evin Erp  [place]  . 0.9 %  sodium chloride infusion   Intravenous Once [place]evin Erp  [place]    SURGICAL HISTORY:  Past  Surgical History:  Procedure Laterality Date  . ABDOMINAL HYSTERECTOMY    . OOPHORECTOMY  1996   tumor removed from right ovary  . TONSILLECTOMY    . VIDEO BRONCHOSCOPY WITH ENDOBRONCHIAL NAVIGATION N/A 11/18/2015   Procedure: VIDEO BRONCHOSCOPY WITH ENDOBRONCHIAL NAVIGATION;  Surgeon: Melrose Nakayama, MD;  Location: [facility];  Service: Thoracic;  Laterality: N/A;  . VIDEO BRONCHOSCOPY WITH ENDOBRONCHIAL ULTRASOUND N/A 11/18/2015    Procedure: VIDEO BRONCHOSCOPY WITH ENDOBRONCHIAL ULTRASOUND;  Surgeon: Melrose Nakayama, MD;  Location: [facility];  Service: Thoracic;  Laterality: N/A;    REVIEW OF SYSTEMS:  Constitutional: positive for fatigue Eyes: negative Ears, nose, mouth, throat, and face: negative Respiratory: positive for cough Cardiovascular: negative Gastrointestinal: negative Genitourinary:negative Integument/breast: negative Hematologic/lymphatic: negative Musculoskeletal:negative Neurological: negative Behavioral/Psych: negative Endocrine: negative Allergic/Immunologic: negative   PHYSICAL EXAMINATION: General appearance: alert, cooperative, fatigued and no distress Head: Normocephalic, without obvious abnormality, atraumatic Neck: no adenopathy, no JVD, supple, symmetrical, trachea midline and thyroid not enlarged, symmetric, no tenderness/mass/nodules Lymph nodes: Cervical, supraclavicular, and axillary nodes normal. Resp: clear to auscultation bilaterally Back: symmetric, no curvature. ROM normal. No CVA tenderness. Cardio: normal apical impulse GI: soft, non-tender; bowel sounds normal; no masses,  no organomegaly Extremities: extremities normal, atraumatic, no cyanosis or edema Neurologic: Alert and oriented X 3, normal strength and tone. Normal symmetric reflexes. Normal coordination and gait  ECOG PERFORMANCE STATUS: 1 - Symptomatic but completely ambulatory  Blood pressure (!) 144/59, pulse (!) 44, temperature 97.6 F (36.4 C), temperature source Oral, resp. rate 18, height '5\' 3"'$  (1.6 m), weight 99 lb 8 oz (45.1 kg), SpO2 98 %.  LABORATORY DATA: Lab Results  Component Value Date   WBC 8.9 03/25/2017   HGB 12.3 03/25/2017   HCT 36.2 03/25/2017   MCV 82.1 03/25/2017   PLT 255 03/25/2017      Chemistry      Component Value Date/Time   NA 133 (L) 03/25/2017 0923   NA 140 02/08/2017 1241   K 4.2 03/25/2017 0923   K 4.3 02/08/2017 1241   CL 98 03/25/2017 0923   CO2 25 03/25/2017  0923   CO2 24 02/08/2017 1241   BUN 9 03/25/2017 0923   BUN 13.3 02/08/2017 1241   CREATININE 1.23 (H) 03/25/2017 0923   CREATININE 1.0 02/08/2017 1241      Component Value Date/Time   CALCIUM 9.4 03/25/2017 0923   CALCIUM 9.5 02/08/2017 1241   ALKPHOS 352 (H) 03/25/2017 0923   ALKPHOS 395 (H) 02/08/2017 1241   AST 22 03/25/2017 0923   AST 61 (H) 02/08/2017 1241   ALT 15 03/25/2017 0923   ALT 53 02/08/2017 1241   BILITOT 0.2 03/25/2017 0923   BILITOT 0.26 02/08/2017 1241       RADIOGRAPHIC STUDIES: Ct Chest W Contrast  Result Date: 03/22/2017 CLINICAL DATA:  Lung cancer with ongoing chemo in recent completion of radiation therapy. EXAM: CT CHEST, ABDOMEN, AND PELVIS WITH CONTRAST TECHNIQUE: Multidetector CT imaging of the chest, abdomen and pelvis was performed following the standard protocol during bolus administration of intravenous contrast. CONTRAST:  64m ISOVUE-300 IOPAMIDOL (ISOVUE-300) INJECTION 61% COMPARISON:  09/25/2016 FINDINGS: CT CHEST FINDINGS Cardiovascular: The heart size is normal. No pericardial effusion. Coronary artery calcification is evident. Atherosclerotic calcification is noted in the wall of the thoracic aorta. Mediastinum/Nodes: Low attenuation inferior right hilar lymph node is similar to prior measuring 1.7 x 1.5 cm today compared to 1.9 x 1.5 cm previously. No left hilar lymphadenopathy. The esophagus has normal imaging  features. There is no axillary lymphadenopathy. Lungs/Pleura: Moderate centrilobular paraseptal emphysema noted as before. Posterior right lower lobe nodular density measures 1.0 x 1.9 cm today compared to 1.2 x 2.2 cm previously. While this lesion is not substantially changed, there is slight progression of soft tissue "fullness" along its cranial margin. No focal airspace consolidation. No pulmonary edema or pleural effusion. Musculoskeletal: Bone windows reveal no worrisome lytic or sclerotic osseous lesions. CT ABDOMEN PELVIS FINDINGS  Hepatobiliary: No focal abnormality within the liver parenchyma. There is no evidence for gallstones, gallbladder wall thickening, or pericholecystic fluid. No intrahepatic or extrahepatic biliary dilation. Pancreas: 6 mm cyst in the tail of pancreas was not clearly visualized on the prior study. No dilatation of the main pancreatic duct. Spleen: No splenomegaly. No focal mass lesion. Adrenals/Urinary Tract: Left adrenal gland unremarkable. Interval development of a 1.5 x 1.8 cm low-density lesion in the right adrenal gland (image 48 series 2). Kidneys are unremarkable. No evidence for hydroureter. The urinary bladder appears normal for the degree of distention. Stomach/Bowel: Stomach is nondistended. No gastric wall thickening. No evidence of outlet obstruction. Duodenum is normally positioned as is the ligament of Treitz. No small bowel wall thickening. No small bowel dilatation. The terminal ileum is normal. The appendix is not visualized, but there is no edema or inflammation in the region of the cecum. No gross colonic mass. No colonic wall thickening. No substantial diverticular change. Vascular/Lymphatic: There is abdominal aortic atherosclerosis without aneurysm. There is no gastrohepatic or hepatoduodenal ligament lymphadenopathy. No intraperitoneal or retroperitoneal lymphadenopathy. No pelvic sidewall lymphadenopathy. Reproductive: Uterus surgically absent.  There is no adnexal mass. Other: No intraperitoneal free fluid. Musculoskeletal: Bone windows reveal no worrisome lytic or sclerotic osseous lesions. IMPRESSION: 1. Interval development of a new necrotic 18 mm right adrenal nodule, highly concerning for metastatic involvement 2. Stable low-density/necrotic lymph node in the right inferior hilum. 3. Posterior right lower lobe subpleural nodule is similar to prior although there is some subtle increased fullness along the cranial margin of this finding. Close attention on follow-up recommended. 4. 6 mm  cystic lesion in the pancreatic tail. Attention on follow-up imaging recommended. 5.  Aortic Atherosclerois (ICD10-170.0) 6.  Emphysema. ([id number]) Electronically Signed   By: Misty Stanley M.D.   On: 03/22/2017 15:50   Ct Abdomen Pelvis W Contrast  Result Date: 03/22/2017 CLINICAL DATA:  Lung cancer with ongoing chemo in recent completion of radiation therapy. EXAM: CT CHEST, ABDOMEN, AND PELVIS WITH CONTRAST TECHNIQUE: Multidetector CT imaging of the chest, abdomen and pelvis was performed following the standard protocol during bolus administration of intravenous contrast. CONTRAST:  30m ISOVUE-300 IOPAMIDOL (ISOVUE-300) INJECTION 61% COMPARISON:  09/25/2016 FINDINGS: CT CHEST FINDINGS Cardiovascular: The heart size is normal. No pericardial effusion. Coronary artery calcification is evident. Atherosclerotic calcification is noted in the wall of the thoracic aorta. Mediastinum/Nodes: Low attenuation inferior right hilar lymph node is similar to prior measuring 1.7 x 1.5 cm today compared to 1.9 x 1.5 cm previously. No left hilar lymphadenopathy. The esophagus has normal imaging features. There is no axillary lymphadenopathy. Lungs/Pleura: Moderate centrilobular paraseptal emphysema noted as before. Posterior right lower lobe nodular density measures 1.0 x 1.9 cm today compared to 1.2 x 2.2 cm previously. While this lesion is not substantially changed, there is slight progression of soft tissue "fullness" along its cranial margin. No focal airspace consolidation. No pulmonary edema or pleural effusion. Musculoskeletal: Bone windows reveal no worrisome lytic or sclerotic osseous lesions. CT ABDOMEN PELVIS FINDINGS Hepatobiliary: No focal  abnormality within the liver parenchyma. There is no evidence for gallstones, gallbladder wall thickening, or pericholecystic fluid. No intrahepatic or extrahepatic biliary dilation. Pancreas: 6 mm cyst in the tail of pancreas was not clearly visualized on the prior study.  No dilatation of the main pancreatic duct. Spleen: No splenomegaly. No focal mass lesion. Adrenals/Urinary Tract: Left adrenal gland unremarkable. Interval development of a 1.5 x 1.8 cm low-density lesion in the right adrenal gland (image 48 series 2). Kidneys are unremarkable. No evidence for hydroureter. The urinary bladder appears normal for the degree of distention. Stomach/Bowel: Stomach is nondistended. No gastric wall thickening. No evidence of outlet obstruction. Duodenum is normally positioned as is the ligament of Treitz. No small bowel wall thickening. No small bowel dilatation. The terminal ileum is normal. The appendix is not visualized, but there is no edema or inflammation in the region of the cecum. No gross colonic mass. No colonic wall thickening. No substantial diverticular change. Vascular/Lymphatic: There is abdominal aortic atherosclerosis without aneurysm. There is no gastrohepatic or hepatoduodenal ligament lymphadenopathy. No intraperitoneal or retroperitoneal lymphadenopathy. No pelvic sidewall lymphadenopathy. Reproductive: Uterus surgically absent.  There is no adnexal mass. Other: No intraperitoneal free fluid. Musculoskeletal: Bone windows reveal no worrisome lytic or sclerotic osseous lesions. IMPRESSION: 1. Interval development of a new necrotic 18 mm right adrenal nodule, highly concerning for metastatic involvement 2. Stable low-density/necrotic lymph node in the right inferior hilum. 3. Posterior right lower lobe subpleural nodule is similar to prior although there is some subtle increased fullness along the cranial margin of this finding. Close attention on follow-up recommended. 4. 6 mm cystic lesion in the pancreatic tail. Attention on follow-up imaging recommended. 5.  Aortic Atherosclerois (ICD10-170.0) 6.  Emphysema. ([id number]) Electronically Signed   By: Misty Stanley M.D.   On: 03/22/2017 15:50    ASSESSMENT AND PLAN:  This is a very pleasant 67 years old  African-American female with metastatic non-small cell lung cancer, adenocarcinoma with positive PDL 1 expression of 99%. She is currently undergoing treatment with Ketruda 200 mg IV every 3 weeks, status post 21 cycles. The patient tolerated the last cycle of her treatment fairly well with no concerning complaints.  She had repeat CT scan of the chest, abdomen and pelvis that showed stable disease except for development of new necrotic right adrenal nodule concerning for metastatic disease versus pseudo-progression. I personally and independently reviewed the scans and discussed the results with the patient and her sister today. I recommended for the patient to continue her current treatment with Keytruda and I would continue to monitor the right adrenal nodule closely on the upcoming imaging studies.  If it continues to increase in size and no other progressive lesion, we will may consider the patient for palliative radiotherapy to this lesion. For the hyperglycemia, I would give the patient a dose of regular insulin 15 units subcutaneously today.  She will continue to monitor her blood sugar and evaluation by her primary care physician. The patient will come back for follow-up visit in 3 weeks for evaluation before starting cycle #23. She was advised to call immediately if she has any concerning symptoms in the interval. The patient voices understanding of current disease status and treatment options and is in agreement with the current care plan. All questions were answered. The patient knows to call the clinic with any problems, questions or concerns. We can certainly see the patient much sooner if necessary.  Disclaimer: This note was dictated with voice recognition software. Similar sounding  words can inadvertently be transcribed and may not be corrected upon review.

## 2017-03-25 NOTE — Progress Notes (Signed)
Chief Complaint  Patient presents with  . Hyperglycemia    Subjective: Patient is a 67 y.o. female here for elevated sugar.  The patient was sent to get an infusion this morning at the cancer center when they checked her sugar.  They reported it was in the 400s.  They did give her some insulin.  She was recommended to follow-up with her PCP.  She has never been diagnosed with diabetes and takes no medications for it.  She has been losing weight, though does have a history of stage IV lung cancer, has been thirsty and urinating frequently.   ROS: Heart: Denies chest pain  Lungs: Denies SOB    Past Medical History:  Diagnosis Date  . Adenocarcinoma of right lung, stage 4 ([facility]) 11/30/2015  . Complication of anesthesia    hard to wake up  . Eczema   . Encounter for antineoplastic chemotherapy 11/30/2015  . Heart murmur    told at age 45 but never has had any problems  . History of radiation therapy 01/26/16-02/07/16   right pelvis 30 Gy in 10 fractions  . HTN (hypertension) 02/01/2016  . 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 Iron deficiency anemia due to chronic blood loss 12/10/2016   Allergies  Allergen Reactions  . Penicillins Swelling    SWELLING REACTION UNSPECIFIED  Has patient had a PCN reaction causing immediate rash, facial/tongue/throat swelling, SOB or lightheadedness with hypotension: Yes Has patient had a PCN reaction causing severe rash involving mucus membranes or skin necrosis: No Has patient had a PCN reaction that required hospitalization No Has patient had a PCN reaction occurring within the last 10 years: No If all of the above answers are "NO", then may proceed with Cephalosporin use.    Current Outpatient Medications:  .  aspirin 325 MG tablet, Take 325 mg by mouth daily., Disp: , Rfl:  .  cetirizine (ZYRTEC) 10 MG tablet, TAKE 1 TABLET(10 MG) BY MOUTH DAILY, Disp: 30 tablet, Rfl: 0 .  dicyclomine (BENTYL) 10 MG capsule, Take 1 capsule (10 mg total) by  mouth 4 (four) times daily -  before meals and at bedtime., Disp: 30 capsule, Rfl: 0 .  hydrocortisone cream 0.5 %, Apply 1 application topically 2 (two) times daily., Disp: 30 g, Rfl: 2 .  levothyroxine (SYNTHROID, LEVOTHROID) 100 MCG tablet, TAKE 1 TABLET(100 MCG) BY MOUTH DAILY BEFORE BREAKFAST, Disp: 30 tablet, Rfl: 0 .  meloxicam (MOBIC) 7.5 MG tablet, TAKE 1 TABLET(7.5 MG) BY MOUTH DAILY, Disp: 90 tablet, Rfl: 1 .  Multiple Vitamin (MULTIVITAMIN) tablet, Take 1 tablet by mouth daily., Disp: , Rfl:  .  ondansetron (ZOFRAN) 4 MG tablet, Take 1 tablet (4 mg total) by mouth every 8 (eight) hours as needed., Disp: 20 tablet, Rfl: 0  Objective: BP 108/60 (BP Location: Left Arm, Patient Position: Sitting, Cuff Size: Normal)   Pulse 83   Temp (!) 97.5 F (36.4 C) (Oral)   Ht 5\' 3"  (1.6 m)   Wt 98 lb 6 oz (44.6 kg)   SpO2 99%   BMI 17.43 kg/m  General: Awake, appears stated age Heart: RRR Lungs: CTAB, no rales, wheezes or rhonchi. No accessory muscle use Psych: Age appropriate judgment and insight, normal affect and mood  Assessment and Plan: Hyperglycemia - Plan: Hemoglobin A1c  Orders as above. By definition, she does meet criteria for diabetes.  We will check an A1c. Glucometer given. Discussed Metformin, will rx pending results of lab. F/u in 1 mo likely for DM visit.  The patient and her son voiced understanding and agreement to the plan.  [place], DO 03/25/17  4:12 PM

## 2017-03-25 NOTE — Progress Notes (Signed)
Pre visit review using our clinic review tool, if applicable. No additional management support is needed unless otherwise documented below in the visit note. 

## 2017-03-25 NOTE — Progress Notes (Signed)
Dr. [name] notified of elevated glucose. Advised to give SQ insulin. Medicated per MAR.

## 2017-03-25 NOTE — Patient Instructions (Signed)
[facility] Discharge Instructions for Patients Receiving Chemotherapy  Today you received the following chemotherapy agents:  Keytruda.  To help prevent nausea and vomiting after your treatment, we encourage you to take your nausea medication as directed.   If you develop nausea and vomiting that is not controlled by your nausea medication, call the clinic.   BELOW ARE SYMPTOMS THAT SHOULD BE REPORTED IMMEDIATELY:  *FEVER GREATER THAN 100.5 F  *CHILLS WITH OR WITHOUT FEVER  NAUSEA AND VOMITING THAT IS NOT CONTROLLED WITH YOUR NAUSEA MEDICATION  *UNUSUAL SHORTNESS OF BREATH  *UNUSUAL BRUISING OR BLEEDING  TENDERNESS IN MOUTH AND THROAT WITH OR WITHOUT PRESENCE OF ULCERS  *URINARY PROBLEMS  *BOWEL PROBLEMS  UNUSUAL RASH Items with * indicate a potential emergency and should be followed up as soon as possible.  Feel free to call the clinic should you have any questions or concerns. The clinic phone number is [phone].  Please show the CHEMO ALERT CARD at check-in to the Emergency Department and triage nurse.    

## 2017-03-25 NOTE — Patient Instructions (Addendum)
Drink lots of water!  I want you to have this monitor to check your sugars at home if need be. You don't need to start checking them until we let you know the results of your labs.   If you do have diabetes, we will start a medicine called Metformin and will have you follow up in 1 month.   Let us know if you need anything.

## 2017-03-25 NOTE — Telephone Encounter (Signed)
FYI

## 2017-03-25 NOTE — Telephone Encounter (Signed)
No additional appts to schedule per 1/28 los - appts already scheduled 3 cycles out per los.

## 2017-03-25 NOTE — Telephone Encounter (Signed)
Patient was at her chemo treatment today and her blood sugar level was 400. Patient reports she has had elevated levels before- but not this high. She also has a yeast infection that will not go away. Patient scheduled today.  Reason for Disposition . [1] Symptoms of high blood sugar (e.g., frequent urination, weak, weight loss) AND [2] not able to test blood glucose  Answer Assessment - Initial Assessment Questions 1. BLOOD GLUCOSE: "What is your blood glucose level?"      400 at treatment today  At home 384 ( sister took ) 2. ONSET: "When did you check the blood glucose?"     today 3. USUAL RANGE: "What is your glucose level usually?" (e.g., usual fasting morning value, usual evening value)     Usually gets checked at treatment- it has been - but not this high 4. KETONES: "Do you check for ketones (urine or blood test strips)?" If yes, ask: "What does the test show now?"      n/a 5. TYPE 1 or 2:  "Do you know what type of diabetes you have?"  (e.g., Type 1, Type 2, Gestational; doesn't know)      Not diagnosed 6. INSULIN: "Do you take insulin?" If yes, ask: "Have you missed any shots recently?"     no 7. DIABETES PILLS: "Do you take any pills for your diabetes?" If yes, ask: "Have you missed taking any pills recently?"     no 8. OTHER SYMPTOMS: "Do you have any symptoms?" (e.g., fever, frequent urination, difficulty breathing, dizziness, weakness, vomiting)     Frequent urination, consistent yeast infection 9. PREGNANCY: "Is there any chance you are pregnant?" "When was your last menstrual period?"     n/a  Protocols used: DIABETES - HIGH BLOOD SUGAR-A-AH

## 2017-03-26 LAB — HEMOGLOBIN A1C: HEMOGLOBIN A1C: 7.2 % — AB (ref 4.6–6.5)

## 2017-03-27 ENCOUNTER — Other Ambulatory Visit: Payer: Self-pay | Admitting: Family Medicine

## 2017-03-27 ENCOUNTER — Encounter: Payer: Self-pay | Admitting: Family Medicine

## 2017-03-27 MED ORDER — METFORMIN HCL 500 MG PO TABS: ORAL_TABLET | ORAL | 2 refills | Status: DC

## 2017-03-27 NOTE — Progress Notes (Signed)
See result note.  

## 2017-03-29 ENCOUNTER — Telehealth: Payer: Self-pay | Admitting: Family Medicine

## 2017-03-29 NOTE — Telephone Encounter (Signed)
Called her and offered appt this afternoon, but was unable to come.  Did schedule her an appt for Monday 04/01/2017 at 9:15 am.

## 2017-03-29 NOTE — Telephone Encounter (Signed)
Pt called in. She said that she was just Dx with Diabetes. She said that she checked her sugar today and it was 521. Pt says that she had a donut and 2 insulins last night. Pt says that she's not sure of the proper way to eat to help with her diabetes. Pt would like to have an diabetic teaching apt to help.   Please advise.    CB: [phone]

## 2017-03-29 NOTE — Telephone Encounter (Signed)
Let's bring her in for an appointment. TY.

## 2017-04-01 ENCOUNTER — Encounter: Payer: Self-pay | Admitting: Family Medicine

## 2017-04-01 ENCOUNTER — Ambulatory Visit (INDEPENDENT_AMBULATORY_CARE_PROVIDER_SITE_OTHER): Payer: Medicare Other | Admitting: Family Medicine

## 2017-04-01 ENCOUNTER — Other Ambulatory Visit: Payer: Self-pay | Admitting: Family Medicine

## 2017-04-01 VITALS — BP 110/72 | HR 94 | Temp 97.4°F | Ht 63.0 in | Wt 93.4 lb

## 2017-04-01 DIAGNOSIS — E119 Type 2 diabetes mellitus without complications: Secondary | ICD-10-CM | POA: Diagnosis not present

## 2017-04-01 DIAGNOSIS — R0781 Pleurodynia: Secondary | ICD-10-CM

## 2017-04-01 MED ORDER — GLIMEPIRIDE 2 MG PO TABS: 2.0000 mg | ORAL_TABLET | Freq: Every day | ORAL | 0 refills | Status: DC

## 2017-04-01 MED ORDER — TRAMADOL HCL 50 MG PO TABS: 50.0000 mg | ORAL_TABLET | Freq: Two times a day (BID) | ORAL | 0 refills | Status: DC | PRN

## 2017-04-01 NOTE — Patient Instructions (Addendum)
Do not use anything that says "NSAIDs" on it.   OK to take Tylenol 1000 mg (2 extra strength tabs) or 975 mg (3 regular strength tabs) every 6 hours as needed.  Ice/cold pack over area for 10-15 min every 2-3 hours while awake.  Heat (pad or rice pillow in microwave) over affected area, 10-15 minutes every 2-3 hours while awake.   Do not drink alcohol, do any illicit/street drugs, drive or do anything that requires alertness while on this medicine.   Healthy Eating Plan Many factors influence your heart health, including eating and exercise habits. Heart (coronary) risk increases with abnormal blood fat (lipid) levels. Heart-healthy meal planning includes limiting unhealthy fats, increasing healthy fats, and making other small dietary changes. This includes maintaining a healthy body weight to help keep lipid levels within a normal range.  WHAT IS MY PLAN?  Your health care provider recommends that you:  Drink a glass of water before meals to help with satiety.  Eat slowly.  An alternative to the water is to add Metamucil. This will help with satiety as well. It does contain calories, unlike water.  WHAT TYPES OF FAT SHOULD I CHOOSE?  Choose healthy fats more often. Choose monounsaturated and polyunsaturated fats, such as olive oil and canola oil, flaxseeds, walnuts, almonds, and seeds.  Eat more omega-3 fats. Good choices include salmon, mackerel, sardines, tuna, flaxseed oil, and ground flaxseeds. Aim to eat fish at least two times each week.  Avoid foods with partially hydrogenated oils in them. These contain trans fats. Examples of foods that contain trans fats are stick margarine, some tub margarines, cookies, crackers, and other baked goods. If you are going to avoid a fat, this is the one to avoid!  WHAT GENERAL GUIDELINES DO I NEED TO FOLLOW?  Check food labels carefully to identify foods with trans fats. Avoid these types of options when possible.  Fill one half of your  plate with vegetables and green salads. Eat 4-5 servings of vegetables per day. A serving of vegetables equals 1 cup of raw leafy vegetables,  cup of raw or cooked cut-up vegetables, or  cup of vegetable juice.  Fill one fourth of your plate with whole grains. Look for the word "whole" as the first word in the ingredient list.  Fill one fourth of your plate with lean protein foods.  Eat 4-5 servings of fruit per day. A serving of fruit equals one medium whole fruit,  cup of dried fruit,  cup of fresh, frozen, or canned fruit. Try to avoid fruits in cups/syrups as the sugar content can be high.  Eat more foods that contain soluble fiber. Examples of foods that contain this type of fiber are apples, broccoli, carrots, beans, peas, and barley. Aim to get 20-30 g of fiber per day.  Eat more home-cooked food and less restaurant, buffet, and fast food.  Limit or avoid alcohol.  Limit foods that are high in starch and sugar.  Avoid fried foods when able.  Cook foods by using methods other than frying. Baking, boiling, grilling, and broiling are all great options. Other fat-reducing suggestions include: ? Removing the skin from poultry. ? Removing all visible fats from meats. ? Skimming the fat off of stews, soups, and gravies before serving them. ? Steaming vegetables in water or broth.  Lose weight if you are overweight. Losing just 5-10% of your initial body weight can help your overall health and prevent diseases such as diabetes and heart disease.  Increase  your consumption of nuts, legumes, and seeds to 4-5 servings per week. One serving of dried beans or legumes equals  cup after being cooked, one serving of nuts equals 1 ounces, and one serving of seeds equals  ounce or 1 tablespoon.  WHAT ARE GOOD FOODS CAN I EAT? Grains Grainy breads (try to find bread that is 3 g of fiber per slice or greater), oatmeal, light popcorn. Whole-grain cereals. Rice and pasta, including brown rice  and those that are made with whole wheat. Edamame pasta is a great alternative to grain pasta. It has a higher protein content. Try to avoid significant consumption of white bread, sugary cereals, or pastries/baked goods.  Vegetables All vegetables. Cooked white potatoes do not count as vegetables.  Fruits All fruits, but limit pineapple and bananas as these fruits have a higher sugar content.  Meats and Other Protein Sources Lean, well-trimmed beef, veal, pork, and lamb. Chicken and [place] without skin. All fish and shellfish. Wild duck, rabbit, pheasant, and venison. Egg whites or low-cholesterol egg substitutes. Dried beans, peas, lentils, and tofu.Seeds and most nuts.  Dairy Low-fat or nonfat cheeses, including ricotta, string, and mozzarella. Skim or 1% milk that is liquid, powdered, or evaporated. Buttermilk that is made with low-fat milk. Nonfat or low-fat yogurt. Soy/Almond milk are good alternatives if you cannot handle dairy.  Beverages Water is the best for you. Sports drinks with less sugar are more desirable unless you are a highly active athlete.  Sweets and Desserts Sherbets and fruit ices. Honey, jam, marmalade, jelly, and syrups. Dark chocolate.  Eat all sweets and desserts in moderation.  Fats and Oils Nonhydrogenated (trans-free) margarines. Vegetable oils, including soybean, sesame, sunflower, olive, peanut, safflower, corn, canola, and cottonseed. Salad dressings or mayonnaise that are made with a vegetable oil. Limit added fats and oils that you use for cooking, baking, salads, and as spreads.  Other Cocoa powder. Coffee and tea. Most condiments.  The items listed above may not be a complete list of recommended foods or beverages. Contact your dietitian for more options.

## 2017-04-01 NOTE — Progress Notes (Signed)
Pre visit review using our clinic review tool, if applicable. No additional management support is needed unless otherwise documented below in the visit note. 

## 2017-04-01 NOTE — Progress Notes (Addendum)
Chief Complaint  Patient presents with  . Blood Sugar Problem    Subjective: Patient is a 67 y.o. female here for f/u diabetes.  Pt recently diagnosed with diabetes.  Her blood sugars have been running in the 300s.  She recently had a CT scan of her abdomen and pelvis which showed a 6 cm lesion in her pancreas.  She has stage IV adenocarcinoma of the lung and is followed by oncology.  Continued pain over her right side and jaw.  She has been taking Mobic that helps a little bit.  ROS: MSK: +Jaw and rib pain Neuro: No numbness/tingling  Family History  Problem Relation Age of Onset  . Colon cancer Neg Hx   . Esophageal cancer Neg Hx   . Rectal cancer Neg Hx   . Stomach cancer Neg Hx    Past Medical History:  Diagnosis Date  . Adenocarcinoma of right lung, stage 4 ([place]) 11/30/2015  . Complication of anesthesia    hard to wake up  . Diabetes mellitus type 2 in nonobese ([facility])   . Eczema   . Encounter for antineoplastic chemotherapy 11/30/2015  . Heart murmur    told at age 42 but never has had any problems  . History of radiation therapy 01/26/16-02/07/16   right pelvis 30 Gy in 10 fractions  . HTN (hypertension) 02/01/2016  . 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 Iron deficiency anemia due to chronic blood loss 12/10/2016   Allergies  Allergen Reactions  . Penicillins Swelling    SWELLING REACTION UNSPECIFIED  Has patient had a PCN reaction causing immediate rash, facial/tongue/throat swelling, SOB or lightheadedness with hypotension: Yes Has patient had a PCN reaction causing severe rash involving mucus membranes or skin necrosis: No Has patient had a PCN reaction that required hospitalization No Has patient had a PCN reaction occurring within the last 10 years: No If all of the above answers are "NO", then may proceed with Cephalosporin use.    Current Outpatient Medications:  .  aspirin 325 MG tablet, Take 325 mg by mouth daily., Disp: , Rfl:  .  cetirizine (ZYRTEC)  10 MG tablet, TAKE 1 TABLET(10 MG) BY MOUTH DAILY, Disp: 30 tablet, Rfl: 0 .  dicyclomine (BENTYL) 10 MG capsule, Take 1 capsule (10 mg total) by mouth 4 (four) times daily -  before meals and at bedtime., Disp: 30 capsule, Rfl: 0 .  hydrocortisone cream 0.5 %, Apply 1 application topically 2 (two) times daily., Disp: 30 g, Rfl: 2 .  levothyroxine (SYNTHROID, LEVOTHROID) 100 MCG tablet, TAKE 1 TABLET(100 MCG) BY MOUTH DAILY BEFORE BREAKFAST, Disp: 30 tablet, Rfl: 0 .  meloxicam (MOBIC) 7.5 MG tablet, TAKE 1 TABLET(7.5 MG) BY MOUTH DAILY, Disp: 90 tablet, Rfl: 1 .  Multiple Vitamin (MULTIVITAMIN) tablet, Take 1 tablet by mouth daily., Disp: , Rfl:  .  ondansetron (ZOFRAN) 4 MG tablet, Take 1 tablet (4 mg total) by mouth every 8 (eight) hours as needed., Disp: 20 tablet, Rfl: 0 .  glimepiride (AMARYL) 2 MG tablet, Take 1 tablet (2 mg total) by mouth daily with breakfast., Disp: 30 tablet, Rfl: 0 .  traMADol (ULTRAM) 50 MG tablet, Take 1 tablet (50 mg total) by mouth every 12 (twelve) hours as needed (Pain)., Disp: 30 tablet, Rfl: 0  Current Facility-Administered Medications:  .  0.9 %  sodium chloride infusion, , Intravenous, Once, [place], [employer], [place] .  0.9 %  sodium chloride infusion, , Intravenous, Once, [place], [place]  Objective: BP 110/72 (  BP Location: Left Arm, Patient Position: Sitting, Cuff Size: Normal)   Pulse 94   Temp (!) 97.4 F (36.3 C) (Oral)   Ht 5\' 3"  (1.6 m)   Wt 93 lb 6 oz (42.4 kg)   SpO2 97%   BMI 16.54 kg/m  General: Awake, appears stated age Lungs: No accessory muscle use Psych: Age appropriate judgment and insight, normal affect and mood  Assessment and Plan: Rib pain on right side - Plan: traMADol (ULTRAM) 50 MG tablet  Diabetes mellitus type 2 in nonobese ([place]) - Plan: glimepiride (AMARYL) 2 MG tablet  Orders as above.  Stop Metformin, start SU. If no improvement, will consider insulin depending on readings. Question whether lesion in  pancreas affecting insulin secretion? Healthy diet handout given. Start Tramadol for breakthru pain.  Ice, heat, Mobic, Tylenol. The patient and her son voiced understanding and agreement to the plan.   [place], DO 04/01/17  11:15 AM

## 2017-04-01 NOTE — Addendum Note (Signed)
Addended by: Ames Coupe on: 04/01/2017 11:17 AM   Modules accepted: Level of Service

## 2017-04-06 ENCOUNTER — Other Ambulatory Visit: Payer: Self-pay

## 2017-04-06 ENCOUNTER — Emergency Department (HOSPITAL_COMMUNITY)
Admission: EM | Admit: 2017-04-06 | Discharge: 2017-04-06 | Disposition: A | Discharge status: Home / Self Care | Payer: Medicare Other | Source: Home / Self Care | Attending: Emergency Medicine | Admitting: Emergency Medicine

## 2017-04-06 ENCOUNTER — Encounter (HOSPITAL_COMMUNITY): Payer: Self-pay

## 2017-04-06 DIAGNOSIS — E039 Hypothyroidism, unspecified: Secondary | ICD-10-CM | POA: Insufficient documentation

## 2017-04-06 DIAGNOSIS — Z79899 Other long term (current) drug therapy: Secondary | ICD-10-CM

## 2017-04-06 DIAGNOSIS — E860 Dehydration: Secondary | ICD-10-CM | POA: Insufficient documentation

## 2017-04-06 DIAGNOSIS — C3491 Malignant neoplasm of unspecified part of right bronchus or lung: Secondary | ICD-10-CM | POA: Diagnosis not present

## 2017-04-06 DIAGNOSIS — I10 Essential (primary) hypertension: Secondary | ICD-10-CM | POA: Insufficient documentation

## 2017-04-06 DIAGNOSIS — Z7982 Long term (current) use of aspirin: Secondary | ICD-10-CM | POA: Insufficient documentation

## 2017-04-06 DIAGNOSIS — E1165 Type 2 diabetes mellitus with hyperglycemia: Secondary | ICD-10-CM | POA: Insufficient documentation

## 2017-04-06 DIAGNOSIS — C3490 Malignant neoplasm of unspecified part of unspecified bronchus or lung: Secondary | ICD-10-CM

## 2017-04-06 DIAGNOSIS — R739 Hyperglycemia, unspecified: Secondary | ICD-10-CM

## 2017-04-06 DIAGNOSIS — R531 Weakness: Secondary | ICD-10-CM

## 2017-04-06 DIAGNOSIS — Z87891 Personal history of nicotine dependence: Secondary | ICD-10-CM | POA: Insufficient documentation

## 2017-04-06 DIAGNOSIS — R400 Somnolence: Secondary | ICD-10-CM | POA: Diagnosis not present

## 2017-04-06 LAB — COMPREHENSIVE METABOLIC PANEL WITH GFR
ALT: 12 U/L — ABNORMAL LOW (ref 14–54)
AST: 17 U/L (ref 15–41)
Albumin: 4 g/dL (ref 3.5–5.0)
Alkaline Phosphatase: 224 U/L — ABNORMAL HIGH (ref 38–126)
Anion gap: 18 — ABNORMAL HIGH (ref 5–15)
BUN: 33 mg/dL — ABNORMAL HIGH (ref 6–20)
CO2: 20 mmol/L — ABNORMAL LOW (ref 22–32)
Calcium: 9.9 mg/dL (ref 8.9–10.3)
Chloride: 90 mmol/L — ABNORMAL LOW (ref 101–111)
Creatinine, Ser: 1.39 mg/dL — ABNORMAL HIGH (ref 0.44–1.00)
GFR calc Af Amer: 45 mL/min — ABNORMAL LOW
GFR calc non Af Amer: 39 mL/min — ABNORMAL LOW
Glucose, Bld: 502 mg/dL (ref 65–99)
Potassium: 4.6 mmol/L (ref 3.5–5.1)
Sodium: 128 mmol/L — ABNORMAL LOW (ref 135–145)
Total Bilirubin: 0.8 mg/dL (ref 0.3–1.2)
Total Protein: 9.3 g/dL — ABNORMAL HIGH (ref 6.5–8.1)

## 2017-04-06 LAB — BASIC METABOLIC PANEL
ANION GAP: 9 (ref 5–15)
BUN: 25 mg/dL — ABNORMAL HIGH (ref 6–20)
CHLORIDE: 108 mmol/L (ref 101–111)
CO2: 20 mmol/L — AB (ref 22–32)
Calcium: 8.7 mg/dL — ABNORMAL LOW (ref 8.9–10.3)
Creatinine, Ser: 0.95 mg/dL (ref 0.44–1.00)
GFR calc non Af Amer: >60 mL/min (ref >60)
GLUCOSE: 118 mg/dL — AB (ref 65–99)
POTASSIUM: 3.6 mmol/L (ref 3.5–5.1)
Sodium: 137 mmol/L (ref 135–145)

## 2017-04-06 LAB — CBG MONITORING, ED
Glucose-Capillary: 454 mg/dL — ABNORMAL HIGH (ref 65–99)
Glucose-Capillary: 505 mg/dL (ref 65–99)
Glucose-Capillary: 216 mg/dL — ABNORMAL HIGH (ref 65–99)

## 2017-04-06 LAB — CBC
HCT: 38.3 % (ref 36.0–46.0)
Hemoglobin: 14 g/dL (ref 12.0–15.0)
MCH: 28.5 pg (ref 26.0–34.0)
MCHC: 36.6 g/dL — ABNORMAL HIGH (ref 30.0–36.0)
MCV: 78 fL (ref 78.0–100.0)
Platelets: 351 K/uL (ref 150–400)
RBC: 4.91 MIL/uL (ref 3.87–5.11)
RDW: 12.9 % (ref 11.5–15.5)
WBC: 9.6 K/uL (ref 4.0–10.5)

## 2017-04-06 LAB — URINALYSIS, ROUTINE W REFLEX MICROSCOPIC
BILIRUBIN URINE: NEGATIVE
Bacteria, UA: NONE SEEN
Glucose, UA: >=500 mg/dL — AB
HGB URINE DIPSTICK: NEGATIVE
Ketones, ur: 80 mg/dL — AB
Nitrite: NEGATIVE
PH: 5 (ref 5.0–8.0)
Protein, ur: NEGATIVE mg/dL
SPECIFIC GRAVITY, URINE: 1.021 (ref 1.005–1.030)

## 2017-04-06 LAB — TROPONIN I

## 2017-04-06 LAB — LIPASE, BLOOD: Lipase: 84 U/L — ABNORMAL HIGH (ref 11–51)

## 2017-04-06 MED ORDER — SODIUM CHLORIDE 0.9 % IV BOLUS (SEPSIS)
1000.0000 mL | Freq: Once | INTRAVENOUS | Status: AC
  Administered 2017-04-06: 1000 mL via INTRAVENOUS

## 2017-04-06 MED ORDER — INSULIN ASPART 100 UNIT/ML ~~LOC~~ SOLN
10.0000 [IU] | Freq: Once | SUBCUTANEOUS | Status: AC
  Administered 2017-04-06: 10 [IU] via SUBCUTANEOUS
  Filled 2017-04-06: qty 1

## 2017-04-06 NOTE — Discharge Instructions (Signed)
We were able to bring your sugar down to an appropriate level while here in the emergency department, your labs have improved during her stay here in the emergency department, dehydration also probably contributed to this.  Please ensure you are drinking plenty of fluids.  Continue with the diabetes medication prescribed by her primary care doctor at this time and please call to schedule follow-up appointment with him for Monday or Tuesday as you likely need to be started on insulin.  If any of the below scenarios develop please return to the emergency department for reevaluation.  High Blood Sugar: Get help right away if... You have difficulty breathing. You have a change in how you think, feel, or act (mental status). You have nausea or vomiting that does not go away.  Low blood Sugar: Get help right away if... You still have symptoms after you eat or drink something sugary. Your blood sugar is at or below 54 mg/dL (3 mmol/L). You have jerky movements that you cannot control. You pass out.

## 2017-04-06 NOTE — ED Triage Notes (Addendum)
Pt c/o hyperglycemia. Her sugar was 512 this morning at home and she has not been eating. 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 A&Ox4, but pt is lethargic and sleepy. Last treatment was 2 weeks ago.

## 2017-04-06 NOTE — ED Provider Notes (Signed)
[facility] DEPT Provider Note   CSN: [id number] Arrival date & time: 04/06/17  1218     History   Chief Complaint Chief Complaint  Patient presents with  . Hyperglycemia    Cancer patient    HPI  Ayonna Speranza is a 67 y.o. Female with history of stage IV adenocarcinoma of the lung, currently treated with Keytruda (last tx 2 weeks ago, followed by Dr. [name]), recently diagnosed type 2 diabetes, hypertension, hypothyroidism and chronic anemia, who presents to the ED for evaluation of hyperglycemia.  Patient reports this morning her sugar was 512, and she has not been eating much of anything, she was recently diagnosed with type 2 diabetes, and her PCP has had a difficult time controlling her sugars, recently switched from metformin to glimepiride, but family members report her sugar has continued to rise even with little to no appetite.  Patient had recent abdominal CT scan on 1/25, which did show a 6 cm pancreatic cyst, seems to be new in comparison to prior scans, on review of PCP notes he is unsure if this could be contributing to hyperglycemia.  Patient reports aside from hyperglycemia she has been feeling generally weak and fatigued, this is been gradual over the past few weeks and progressively worsening.  Patient reports she has chest pain at baseline, this is not increased or changed at all, no increase in shortness of breath or difficulty breathing.  Patient reports some epigastric and periumbilical abdominal pain, had some nausea earlier in the week that is since resolved.  Denies any diarrhea or blood in the stools.  Patient denies any recent fevers or chills, cough is at baseline and nonproductive, no nasal congestion, rhinorrhea or sore throat.     Past Medical History:  Diagnosis Date  . Adenocarcinoma of right lung, stage 4 ([place]) 11/30/2015  . Complication of anesthesia    hard to wake up  . Diabetes mellitus type 2 in nonobese (HCC)   . Eczema    . Encounter for antineoplastic chemotherapy 11/30/2015  . Heart murmur    told at age 43 but never has had any problems  . History of radiation therapy 01/26/16-02/07/16   right pelvis 30 Gy in 10 fractions  . HTN (hypertension) 02/01/2016  . 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 Iron deficiency anemia due to chronic blood loss 12/10/2016    Patient Active Problem List   Diagnosis Date Noted  . Diabetes mellitus type 2 in nonobese ([place]) 04/01/2017  . Hyperglycemia 03/25/2017  . Rib pain on right side 03/21/2017  . 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 Chronic fatigue 12/24/2016  . Iron deficiency anemia due to chronic blood loss 12/10/2016  . 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 HTN (hypertension) 02/01/2016  . Encounter for antineoplastic immunotherapy 12/21/2015  . Adenocarcinoma of right lung, stage 4 ([place]) 11/30/2015  . Encounter for antineoplastic chemotherapy 11/30/2015  . Tobacco abuse 11/10/2015  . Lung mass 10/11/2015    Past Surgical History:  Procedure Laterality Date  . ABDOMINAL HYSTERECTOMY    . OOPHORECTOMY  1996   tumor removed from right ovary  . TONSILLECTOMY    . VIDEO BRONCHOSCOPY WITH ENDOBRONCHIAL NAVIGATION N/A 11/18/2015   Procedure: VIDEO BRONCHOSCOPY WITH ENDOBRONCHIAL NAVIGATION;  Surgeon: Melrose Nakayama, MD;  Location: [facility];  Service: Thoracic;  Laterality: N/A;  . VIDEO BRONCHOSCOPY WITH ENDOBRONCHIAL ULTRASOUND N/A 11/18/2015   Procedure: VIDEO BRONCHOSCOPY WITH ENDOBRONCHIAL ULTRASOUND;  Surgeon: Melrose Nakayama, MD;  Location: [facility];  Service: Thoracic;  Laterality: N/A;    OB History    No data available       Home Medications    Prior to Admission medications   Medication Sig Start Date End Date Taking? Authorizing Provider  aspirin 325 MG tablet Take 325 mg by mouth daily.    [provider]  cetirizine (ZYRTEC) 10 MG tablet TAKE 1 TABLET(10 MG) BY MOUTH DAILY 03/04/17   Curt Bears, MD  dicyclomine (BENTYL) 10 MG  capsule Take 1 capsule (10 mg total) by mouth 4 (four) times daily -  before meals and at bedtime. 03/21/17   [name], DO  glimepiride (AMARYL) 2 MG tablet TAKE 1 TABLET(2 MG) BY MOUTH DAILY WITH BREAKFAST 04/01/17   Wendling, Crosby Oyster, DO  hydrocortisone cream 0.5 % Apply 1 application topically 2 (two) times daily. 03/26/16   Maryanna Shape, NP  levothyroxine (SYNTHROID, LEVOTHROID) 100 MCG tablet TAKE 1 TABLET(100 MCG) BY MOUTH DAILY BEFORE BREAKFAST 03/04/17   Curt Bears, MD  meloxicam (MOBIC) 7.5 MG tablet TAKE 1 TABLET(7.5 MG) BY MOUTH DAILY 03/21/17   [name], DO  Multiple Vitamin (MULTIVITAMIN) tablet Take 1 tablet by mouth daily.    [provider]  ondansetron (ZOFRAN) 4 MG tablet Take 1 tablet (4 mg total) by mouth every 8 (eight) hours as needed. 03/21/17   [name], DO  traMADol (ULTRAM) 50 MG tablet Take 1 tablet (50 mg total) by mouth every 12 (twelve) hours as needed (Pain). 04/01/17   [name], DO    Family History Family History  Problem Relation Age of Onset  . Colon cancer Neg Hx   . Esophageal cancer Neg Hx   . Rectal cancer Neg Hx   . Stomach cancer Neg Hx     Social History Social History   Tobacco Use  . Smoking status: Former Smoker    Packs/day: 0.50    Years: 30.00    Pack years: 15.00    Last attempt to quit: 11/01/2015    Years since quitting: 1.4  . Smokeless tobacco: Never Used  Substance Use Topics  . Alcohol use: No  . Drug use: No     Allergies   Penicillins   Review of Systems Review of Systems  Constitutional: Positive for fatigue. Negative for chills and fever.  HENT: Negative for congestion, rhinorrhea and sore throat.   Eyes: Negative for photophobia and visual disturbance.  Respiratory: Positive for cough (At baseline). Negative for chest tightness, shortness of breath and wheezing.   Cardiovascular: Positive for chest pain (At baseline).  Gastrointestinal:  Positive for abdominal pain and nausea. Negative for blood in stool, diarrhea and vomiting.  Genitourinary: Negative for dysuria and frequency.  Musculoskeletal: Positive for back pain and myalgias.  Skin: Negative for color change, rash and wound.  Neurological: Positive for dizziness and weakness. Negative for facial asymmetry, speech difficulty, numbness and headaches.     Physical Exam Updated Vital Signs BP (!) 150/65 (BP Location: Left Arm)   Pulse (!) 101   Temp 98.1 F (36.7 C) (Oral)   Resp 16   SpO2 96%   Physical Exam  Constitutional: She is oriented to person, place, and time. No distress.  Patient chronically ill-appearing, in no acute distress laying on stretcher  HENT:  Head: Normocephalic and atraumatic.  Eyes: Pupils are equal, round, and reactive to light.  Neck: Neck supple.  Cardiovascular: Normal rate, regular rhythm and normal heart sounds.  Pulmonary/Chest: Effort normal and breath  sounds normal. No stridor. No respiratory distress. She has no wheezes. She has no rales.  Lung sounds decreased throughout, but no wheezes, rhonchi or rales, respirations equal and unlabored and patient able to speak in full sentences, no tachypnea or evidence of Kussmaul breathing  Abdominal: Soft. Bowel sounds are normal.  Abdomen soft and nondistended, bowel sounds present, mild epigastric and periumbilical tenderness without guarding, all other quadrants nontender to palpation, no rebound tenderness or peritoneal signs  Musculoskeletal: She exhibits no edema or deformity.  Neurological: She is alert and oriented to person, place, and time.  Speech is clear, able to follow commands CN III-XII intact Normal strength in upper and lower extremities bilaterally including dorsiflexion and plantar flexion, strong and equal grip strength Sensation normal to light and sharp touch Moves extremities without ataxia, coordination intact  Skin: Skin is warm and dry. Capillary refill takes  less than 2 seconds. She is not diaphoretic.  Psychiatric: She has a normal mood and affect. Her behavior is normal.  Nursing note and vitals reviewed.    ED Treatments / Results  Labs (all labs ordered are listed, but only abnormal results are displayed) Labs Reviewed  CBC - Abnormal; Notable for the following components:      Result Value   MCHC 36.6 (*)    All other components within normal limits  URINALYSIS, ROUTINE W REFLEX MICROSCOPIC - Abnormal; Notable for the following components:   Color, Urine STRAW (*)    Glucose, UA >=500 (*)    Ketones, ur 80 (*)    Leukocytes, UA SMALL (*)    Squamous Epithelial / LPF 0-5 (*)    All other components within normal limits  COMPREHENSIVE METABOLIC PANEL - Abnormal; Notable for the following components:   Sodium 128 (*)    Chloride 90 (*)    CO2 20 (*)    Glucose, Bld 502 (*)    BUN 33 (*)    Creatinine, Ser 1.39 (*)    Total Protein 9.3 (*)    ALT 12 (*)    Alkaline Phosphatase 224 (*)    GFR calc non Af Amer 39 (*)    GFR calc Af Amer 45 (*)    Anion gap 18 (*)    All other components within normal limits  LIPASE, BLOOD - Abnormal; Notable for the following components:   Lipase 84 (*)    All other components within normal limits  BLOOD GAS, VENOUS - Abnormal; Notable for the following components:   pCO2, Ven 41.8 (*)    Bicarbonate 19.0 (*)    Acid-base deficit 7.0 (*)    All other components within normal limits  BASIC METABOLIC PANEL - Abnormal; Notable for the following components:   CO2 20 (*)    Glucose, Bld 118 (*)    BUN 25 (*)    Calcium 8.7 (*)    All other components within normal limits  CBG MONITORING, ED - Abnormal; Notable for the following components:   Glucose-Capillary 454 (*)    All other components within normal limits  CBG MONITORING, ED - Abnormal; Notable for the following components:   Glucose-Capillary 505 (*)    All other components within normal limits  CBG MONITORING, ED - Abnormal;  Notable for the following components:   Glucose-Capillary 216 (*)    All other components within normal limits  TROPONIN I    EKG  EKG Interpretation  Date/Time:  Saturday April 06 2017 16:22:38 EST Ventricular Rate:  86 PR Interval:  QRS Duration: 76 QT Interval:  318 QTC Calculation: 381 R Axis:   67 Text Interpretation:  Sinus rhythm Left atrial enlargement RSR' in V1 or V2, probably normal variant Left ventricular hypertrophy Baseline wander in lead(s) V1 No significant change since last tracing Confirmed by Wandra Arthurs [phone]) on 04/06/2017 8:37:51 PM       Radiology No results found.  Procedures Procedures (including critical care time)  Medications Ordered in ED Medications  sodium chloride 0.9 % bolus 1,000 mL (0 mLs Intravenous Stopped 04/06/17 1643)  insulin aspart (novoLOG) injection 10 Units (10 Units Subcutaneous Given 04/06/17 1546)  sodium chloride 0.9 % bolus 1,000 mL (0 mLs Intravenous Stopped 04/06/17 1749)     Initial Impression / Assessment and Plan / ED Course  I have reviewed the triage vital signs and the nursing notes.  Pertinent labs & imaging results that were available during my care of the patient were reviewed by me and considered in my medical decision making (see chart for details).  Patient presents to the ED for evaluation of hyperglycemia, generalized weakness and fatigue. Hx of Stage IV lung cancer, with 6 cm lesion noted to pancreas on January scans, recently diagnosed with type 2 diabetes and pancreatic lesion may be contributing.  On exam patient is chronically ill-appearing, but in no acute distress, nontoxic and not acutely ill-appearing, vitals normal on arrival.  On exam patient has mild tenderness in the epigastrium and periumbilical region, abdomen otherwise benign.  CBG here in the ED 505.  Will get CBC, CMP, lipase, troponin, UA and EKG.  Will give 1 L fluid bolus and 10 units of SQ insulin.  Labs returned, CMP shows glucose of 502  with an anion gap of 18, potassium is normal at 4.6, CO2 is 28.  Patient with anion gap, but does not ill-appearing and obviously suggesting of DKA on exam, will get a VBG.  creatinine is slightly elevated from baseline at 1.39, likely in the setting of dehydration.  Liver function is at baseline.  No other acute metabolic derangements.  CBC shows no leukocytosis and hemoglobin is normal.  Lipase mildly elevated at 84, no prior to compare to.  Urine without evidence of infection, patient does have 80 ketones present in the urine.  Troponin negative and EKG w/o significant change when compared to prior.   VBG shows a pH of 7.28, bicarb is 19 and PCO2 is 41.8, suggesting metabolic anion gap acidosis with mild respiratory compensation, pH derangement is mild, given the patient is so well-appearing will give additional fluid bolus and recheck sugar and BMP at this time to see if anion gap has improved, if this is the case feel patient can be seen in close follow-up with her PCP and started on insulin outpatient, if anion gap persist, patient will need to be admitted with DKA protocol.  After second fluid bolus, labs show CBG of 216 and Anion gap of 9, given complete . Pt tolerating PO in the ED and continues with normal vitals.  Given complete correction of anion gap and vast improvement in glucose feel pt is stable for discharge home with close follow up with her PCP in the next 2-3 days, as pt will likely need to be started on insulin. Discussed return precautions surrounding both hyper- and hypoglycemia with pt and family members and provided resources.  Patient expresses understanding and is in agreement with plan.  Patient discussed with Dr. Darl Householder, who saw patient as well and agrees with plan.  Final Clinical Impressions(s) / ED Diagnoses   Final diagnoses:  Hyperglycemia  Weakness  Adenocarcinoma of lung, stage 4, unspecified laterality [facility])  Dehydration    ED Discharge Orders    None           Janet Berlin 04/06/17 2039    Drenda Freeze, MD 04/06/17 [phone]

## 2017-04-06 NOTE — ED Notes (Signed)
Bed: YG47 Expected date:  Expected time:  Means of arrival:  Comments: TR 3 ?

## 2017-04-06 NOTE — ED Notes (Signed)
Pt's family member very upset and yelling at Probation officer when Probation officer went into room to see if patient had gone to [facility]. When writer asked if they could do anything for them, the visitor responded very rudely by saying "we've only been here since 12pm, not that anyone cares but you need to discharge me". Writer explained Ria Comment, the RN would have to discharge the pt because writer was unable to do so due to it being out of scope. Pt's family did not care and said "get the RN in here now then". Ria Comment, RN has been made aware.

## 2017-04-08 ENCOUNTER — Ambulatory Visit (INDEPENDENT_AMBULATORY_CARE_PROVIDER_SITE_OTHER): Payer: Medicare Other | Admitting: Family Medicine

## 2017-04-08 ENCOUNTER — Encounter: Payer: Self-pay | Admitting: Family Medicine

## 2017-04-08 ENCOUNTER — Emergency Department (HOSPITAL_BASED_OUTPATIENT_CLINIC_OR_DEPARTMENT_OTHER): Payer: Medicare Other

## 2017-04-08 ENCOUNTER — Encounter (HOSPITAL_BASED_OUTPATIENT_CLINIC_OR_DEPARTMENT_OTHER): Payer: Self-pay | Admitting: *Deleted

## 2017-04-08 ENCOUNTER — Inpatient Hospital Stay (HOSPITAL_BASED_OUTPATIENT_CLINIC_OR_DEPARTMENT_OTHER)
Admission: EM | Admit: 2017-04-08 | Discharge: 2017-04-11 | DRG: 637 | Disposition: A | Discharge status: Home / Self Care | Payer: Medicare Other | Attending: Family Medicine | Admitting: Family Medicine

## 2017-04-08 ENCOUNTER — Other Ambulatory Visit: Payer: Self-pay

## 2017-04-08 ENCOUNTER — Other Ambulatory Visit: Payer: Medicare Other

## 2017-04-08 ENCOUNTER — Ambulatory Visit: Payer: Medicare Other | Admitting: Internal Medicine

## 2017-04-08 ENCOUNTER — Ambulatory Visit: Payer: Medicare Other

## 2017-04-08 VITALS — BP 106/50 | HR 98 | Temp 98.7°F | Ht 63.0 in | Wt 90.0 lb

## 2017-04-08 DIAGNOSIS — E119 Type 2 diabetes mellitus without complications: Secondary | ICD-10-CM | POA: Diagnosis not present

## 2017-04-08 DIAGNOSIS — E1165 Type 2 diabetes mellitus with hyperglycemia: Principal | ICD-10-CM | POA: Diagnosis present

## 2017-04-08 DIAGNOSIS — Z79890 Hormone replacement therapy (postmenopausal): Secondary | ICD-10-CM | POA: Diagnosis not present

## 2017-04-08 DIAGNOSIS — Z7982 Long term (current) use of aspirin: Secondary | ICD-10-CM | POA: Diagnosis not present

## 2017-04-08 DIAGNOSIS — R400 Somnolence: Secondary | ICD-10-CM

## 2017-04-08 DIAGNOSIS — R011 Cardiac murmur, unspecified: Secondary | ICD-10-CM | POA: Diagnosis present

## 2017-04-08 DIAGNOSIS — E440 Moderate protein-calorie malnutrition: Secondary | ICD-10-CM | POA: Diagnosis not present

## 2017-04-08 DIAGNOSIS — R4182 Altered mental status, unspecified: Secondary | ICD-10-CM

## 2017-04-08 DIAGNOSIS — J189 Pneumonia, unspecified organism: Secondary | ICD-10-CM | POA: Diagnosis present

## 2017-04-08 DIAGNOSIS — E43 Unspecified severe protein-calorie malnutrition: Secondary | ICD-10-CM

## 2017-04-08 DIAGNOSIS — N3000 Acute cystitis without hematuria: Secondary | ICD-10-CM | POA: Diagnosis not present

## 2017-04-08 DIAGNOSIS — M549 Dorsalgia, unspecified: Secondary | ICD-10-CM | POA: Diagnosis present

## 2017-04-08 DIAGNOSIS — Z794 Long term (current) use of insulin: Secondary | ICD-10-CM | POA: Diagnosis not present

## 2017-04-08 DIAGNOSIS — R05 Cough: Secondary | ICD-10-CM | POA: Diagnosis not present

## 2017-04-08 DIAGNOSIS — E876 Hypokalemia: Secondary | ICD-10-CM | POA: Diagnosis not present

## 2017-04-08 DIAGNOSIS — Z880 Allergy status to penicillin: Secondary | ICD-10-CM | POA: Diagnosis not present

## 2017-04-08 DIAGNOSIS — Z85118 Personal history of other malignant neoplasm of bronchus and lung: Secondary | ICD-10-CM

## 2017-04-08 DIAGNOSIS — C3491 Malignant neoplasm of unspecified part of right bronchus or lung: Secondary | ICD-10-CM | POA: Diagnosis present

## 2017-04-08 DIAGNOSIS — R300 Dysuria: Secondary | ICD-10-CM | POA: Diagnosis not present

## 2017-04-08 DIAGNOSIS — K858 Other acute pancreatitis without necrosis or infection: Secondary | ICD-10-CM | POA: Diagnosis not present

## 2017-04-08 DIAGNOSIS — D500 Iron deficiency anemia secondary to blood loss (chronic): Secondary | ICD-10-CM | POA: Diagnosis present

## 2017-04-08 DIAGNOSIS — Z79899 Other long term (current) drug therapy: Secondary | ICD-10-CM

## 2017-04-08 DIAGNOSIS — Z720 Tobacco use: Secondary | ICD-10-CM | POA: Diagnosis present

## 2017-04-08 DIAGNOSIS — Z791 Long term (current) use of non-steroidal anti-inflammatories (NSAID): Secondary | ICD-10-CM | POA: Diagnosis not present

## 2017-04-08 DIAGNOSIS — Z923 Personal history of irradiation: Secondary | ICD-10-CM

## 2017-04-08 DIAGNOSIS — E039 Hypothyroidism, unspecified: Secondary | ICD-10-CM | POA: Diagnosis present

## 2017-04-08 DIAGNOSIS — E871 Hypo-osmolality and hyponatremia: Secondary | ICD-10-CM | POA: Diagnosis present

## 2017-04-08 DIAGNOSIS — Z87891 Personal history of nicotine dependence: Secondary | ICD-10-CM | POA: Diagnosis not present

## 2017-04-08 DIAGNOSIS — Z716 Tobacco abuse counseling: Secondary | ICD-10-CM

## 2017-04-08 DIAGNOSIS — G9340 Encephalopathy, unspecified: Secondary | ICD-10-CM | POA: Insufficient documentation

## 2017-04-08 DIAGNOSIS — G9341 Metabolic encephalopathy: Secondary | ICD-10-CM | POA: Diagnosis not present

## 2017-04-08 DIAGNOSIS — K869 Disease of pancreas, unspecified: Secondary | ICD-10-CM | POA: Diagnosis not present

## 2017-04-08 DIAGNOSIS — K859 Acute pancreatitis without necrosis or infection, unspecified: Secondary | ICD-10-CM | POA: Diagnosis not present

## 2017-04-08 DIAGNOSIS — Z9221 Personal history of antineoplastic chemotherapy: Secondary | ICD-10-CM

## 2017-04-08 DIAGNOSIS — R739 Hyperglycemia, unspecified: Secondary | ICD-10-CM | POA: Diagnosis not present

## 2017-04-08 DIAGNOSIS — Z90721 Acquired absence of ovaries, unilateral: Secondary | ICD-10-CM

## 2017-04-08 DIAGNOSIS — Z90710 Acquired absence of both cervix and uterus: Secondary | ICD-10-CM

## 2017-04-08 DIAGNOSIS — I10 Essential (primary) hypertension: Secondary | ICD-10-CM | POA: Diagnosis not present

## 2017-04-08 DIAGNOSIS — Z681 Body mass index (BMI) 19 or less, adult: Secondary | ICD-10-CM

## 2017-04-08 LAB — CBC WITH DIFFERENTIAL/PLATELET
BASOS ABS: 0 10*3/uL (ref 0.0–0.1)
Basophils Relative: 0 %
EOS PCT: 2 %
Eosinophils Absolute: 0.1 10*3/uL (ref 0.0–0.7)
HEMATOCRIT: 34.2 % — AB (ref 36.0–46.0)
Hemoglobin: 12.5 g/dL (ref 12.0–15.0)
LYMPHS ABS: 1.2 10*3/uL (ref 0.7–4.0)
LYMPHS PCT: 18 %
MCH: 28.6 pg (ref 26.0–34.0)
MCHC: 36.5 g/dL — AB (ref 30.0–36.0)
MCV: 78.3 fL (ref 78.0–100.0)
MONO ABS: 0.7 10*3/uL (ref 0.1–1.0)
Monocytes Relative: 11 %
NEUTROS ABS: 4.6 10*3/uL (ref 1.7–7.7)
Neutrophils Relative %: 69 %
Platelets: 328 10*3/uL (ref 150–400)
RBC: 4.37 MIL/uL (ref 3.87–5.11)
RDW: 12.8 % (ref 11.5–15.5)
WBC: 6.7 10*3/uL (ref 4.0–10.5)

## 2017-04-08 LAB — BLOOD GAS, VENOUS
ACID-BASE DEFICIT: 7 mmol/L — AB (ref 0.0–2.0)
Bicarbonate: 19 mmol/L — ABNORMAL LOW (ref 20.0–28.0)
O2 SAT: 47 %
PATIENT TEMPERATURE: 98.6
PCO2 VEN: 41.8 mmHg — AB (ref 44.0–60.0)
pH, Ven: 7.28 (ref 7.250–7.430)

## 2017-04-08 LAB — I-STAT ARTERIAL BLOOD GAS, ED
Acid-base deficit: 9 mmol/L — ABNORMAL HIGH (ref 0.0–2.0)
Bicarbonate: 15.8 mmol/L — ABNORMAL LOW (ref 20.0–28.0)
O2 SAT: 93 %
PCO2 ART: 30.9 mmHg — AB (ref 32.0–48.0)
PH ART: 7.315 — AB (ref 7.350–7.450)
Patient temperature: 98.3
TCO2: 17 mmol/L — ABNORMAL LOW (ref 22–32)
pO2, Arterial: 72 mmHg — ABNORMAL LOW (ref 83.0–108.0)

## 2017-04-08 LAB — COMPREHENSIVE METABOLIC PANEL
ALT: 12 U/L — ABNORMAL LOW (ref 14–54)
AST: 18 U/L (ref 15–41)
Albumin: 3.5 g/dL (ref 3.5–5.0)
Alkaline Phosphatase: 183 U/L — ABNORMAL HIGH (ref 38–126)
Anion gap: 14 (ref 5–15)
BILIRUBIN TOTAL: 0.8 mg/dL (ref 0.3–1.2)
BUN: 15 mg/dL (ref 6–20)
CO2: 21 mmol/L — ABNORMAL LOW (ref 22–32)
Calcium: 9.3 mg/dL (ref 8.9–10.3)
Chloride: 98 mmol/L — ABNORMAL LOW (ref 101–111)
Creatinine, Ser: 0.98 mg/dL (ref 0.44–1.00)
GFR calc Af Amer: >60 mL/min (ref >60)
GFR, EST NON AFRICAN AMERICAN: 59 mL/min — AB (ref >60)
Glucose, Bld: 352 mg/dL — ABNORMAL HIGH (ref 65–99)
POTASSIUM: 4.1 mmol/L (ref 3.5–5.1)
Sodium: 133 mmol/L — ABNORMAL LOW (ref 135–145)
TOTAL PROTEIN: 8.1 g/dL (ref 6.5–8.1)

## 2017-04-08 LAB — TROPONIN I: Troponin I: <0.03 ng/mL (ref <0.03)

## 2017-04-08 LAB — URINALYSIS, ROUTINE W REFLEX MICROSCOPIC
Ketones, ur: >80 mg/dL — AB
Nitrite: NEGATIVE
PROTEIN: NEGATIVE mg/dL
Specific Gravity, Urine: 1.025 (ref 1.005–1.030)
pH: 6 (ref 5.0–8.0)

## 2017-04-08 LAB — AMMONIA

## 2017-04-08 LAB — LIPASE, BLOOD: Lipase: 201 U/L — ABNORMAL HIGH (ref 11–51)

## 2017-04-08 LAB — PROTIME-INR
INR: 0.93
Prothrombin Time: 12.4 seconds (ref 11.4–15.2)

## 2017-04-08 LAB — URINALYSIS, MICROSCOPIC (REFLEX)

## 2017-04-08 LAB — CBG MONITORING, ED
Glucose-Capillary: 290 mg/dL — ABNORMAL HIGH (ref 65–99)
Glucose-Capillary: 235 mg/dL — ABNORMAL HIGH (ref 65–99)

## 2017-04-08 LAB — I-STAT CG4 LACTIC ACID, ED: LACTIC ACID, VENOUS: 0.47 mmol/L — AB (ref 0.5–1.9)

## 2017-04-08 LAB — GLUCOSE, POCT (MANUAL RESULT ENTRY): POC GLUCOSE: 317 mg/dL — AB (ref 70–99)

## 2017-04-08 LAB — TSH: TSH: 3.024 u[IU]/mL (ref 0.350–4.500)

## 2017-04-08 MED ORDER — SODIUM CHLORIDE 0.9 % IV BOLUS (SEPSIS)
1000.0000 mL | Freq: Once | INTRAVENOUS | Status: AC
  Administered 2017-04-08: 1000 mL via INTRAVENOUS

## 2017-04-08 MED ORDER — LEVOFLOXACIN IN D5W 750 MG/150ML IV SOLN
750.0000 mg | Freq: Once | INTRAVENOUS | Status: AC
  Administered 2017-04-08: 750 mg via INTRAVENOUS
  Filled 2017-04-08: qty 150

## 2017-04-08 MED ORDER — INSULIN GLARGINE 100 UNIT/ML SOLOSTAR PEN: 8.0000 [IU] | PEN_INJECTOR | Freq: Every day | SUBCUTANEOUS | 1 refills | Status: DC

## 2017-04-08 NOTE — ED Triage Notes (Signed)
Pt sent here from PMD with elevated Blood sugar

## 2017-04-08 NOTE — Patient Instructions (Addendum)
Go downstairs to the emergency department now.   If we are able to go home tonight: Take insulin at night. Check sugars in the morning before eating. Write down levels and bring to appointment.  Let us know if you need anything.

## 2017-04-08 NOTE — ED Notes (Addendum)
Unable to obtained a good Spo2 reading from hands and both ears, RN Rebekah. Got a good wave form from Lt ear lobe but won't continue to read.

## 2017-04-08 NOTE — ED Provider Notes (Signed)
[facility] EMERGENCY DEPARTMENT Provider Note   CSN: [id number] Arrival date & time: 04/08/17  1444     History   Chief Complaint Chief Complaint  Patient presents with  . Hyperglycemia    HPI Brittany Mercado is a 67 y.o. female.  The history is provided by the patient, a friend and medical records. No language interpreter was used.  Altered Mental Status   This is a new problem. The current episode started more than 2 days ago. The problem has been gradually worsening. Associated symptoms include confusion and somnolence. Pertinent negatives include no agitation. Risk factors include a recent illness. Her past medical history is significant for diabetes (recent dx). Her past medical history does not include COPD or dementia.    Past Medical History:  Diagnosis Date  . Adenocarcinoma of right lung, stage 4 ([place]) 11/30/2015  . Complication of anesthesia    hard to wake up  . Diabetes mellitus type 2 in nonobese (HCC)   . Eczema   . Encounter for antineoplastic chemotherapy 11/30/2015  . Heart murmur    told at age 61 but never has had any problems  . History of radiation therapy 01/26/16-02/07/16   right pelvis 30 Gy in 10 fractions  . HTN (hypertension) 02/01/2016  . 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 Iron deficiency anemia due to chronic blood loss 12/10/2016    Patient Active Problem List   Diagnosis Date Noted  . Diabetes mellitus type 2 in nonobese ([place]) 04/01/2017  . Hyperglycemia 03/25/2017  . Rib pain on right side 03/21/2017  . 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 Chronic fatigue 12/24/2016  . Iron deficiency anemia due to chronic blood loss 12/10/2016  . 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 HTN (hypertension) 02/01/2016  . Encounter for antineoplastic immunotherapy 12/21/2015  . Adenocarcinoma of right lung, stage 4 ([place]) 11/30/2015  . Encounter for antineoplastic chemotherapy 11/30/2015  . Tobacco abuse 11/10/2015  . Lung mass 10/11/2015    Past  Surgical History:  Procedure Laterality Date  . ABDOMINAL HYSTERECTOMY    . OOPHORECTOMY  1996   tumor removed from right ovary  . TONSILLECTOMY    . VIDEO BRONCHOSCOPY WITH ENDOBRONCHIAL NAVIGATION N/A 11/18/2015   Procedure: VIDEO BRONCHOSCOPY WITH ENDOBRONCHIAL NAVIGATION;  Surgeon: Melrose Nakayama, MD;  Location: [facility];  Service: Thoracic;  Laterality: N/A;  . VIDEO BRONCHOSCOPY WITH ENDOBRONCHIAL ULTRASOUND N/A 11/18/2015   Procedure: VIDEO BRONCHOSCOPY WITH ENDOBRONCHIAL ULTRASOUND;  Surgeon: Melrose Nakayama, MD;  Location: [facility];  Service: Thoracic;  Laterality: N/A;    OB History    No data available       Home Medications    Prior to Admission medications   Medication Sig Start Date End Date Taking? Authorizing Provider  aspirin 325 MG tablet Take 325 mg by mouth daily.    [provider]  cetirizine (ZYRTEC) 10 MG tablet TAKE 1 TABLET(10 MG) BY MOUTH DAILY 03/04/17   Curt Bears, MD  dicyclomine (BENTYL) 10 MG capsule Take 1 capsule (10 mg total) by mouth 4 (four) times daily -  before meals and at bedtime. 03/21/17   [name], DO  ferrous sulfate 325 (65 FE) MG tablet Take 325 mg by mouth daily.    [provider]  hydrocortisone cream 0.5 % Apply 1 application topically 2 (two) times daily. 03/26/16   Maryanna Shape, NP  Insulin Glargine (LANTUS) 100 UNIT/ML Solostar Pen Inject 8 Units into the skin daily at 10 pm. 04/08/17   Wendling,  Crosby Oyster, DO  levothyroxine (SYNTHROID, LEVOTHROID) 100 MCG tablet TAKE 1 TABLET(100 MCG) BY MOUTH DAILY BEFORE BREAKFAST 03/04/17   Curt Bears, MD  meloxicam (MOBIC) 7.5 MG tablet TAKE 1 TABLET(7.5 MG) BY MOUTH DAILY 03/21/17   [name], DO  Multiple Vitamin (MULTIVITAMIN) tablet Take 1 tablet by mouth daily.    [provider]  traMADol (ULTRAM) 50 MG tablet Take 1 tablet (50 mg total) by mouth every 12 (twelve) hours as needed (Pain). 04/01/17   [name], DO    Family History Family History  Problem Relation Age of Onset  . Colon cancer Neg Hx   . Esophageal cancer Neg Hx   . Rectal cancer Neg Hx   . Stomach cancer Neg Hx     Social History Social History   Tobacco Use  . Smoking status: Former Smoker    Packs/day: 0.50    Years: 30.00    Pack years: 15.00    Last attempt to quit: 11/01/2015    Years since quitting: 1.4  . Smokeless tobacco: Never Used  Substance Use Topics  . Alcohol use: No  . Drug use: No     Allergies   Penicillins   Review of Systems Review of Systems  Constitutional: Positive for chills, fatigue and fever. Negative for diaphoresis.  HENT: Positive for congestion.   Respiratory: Positive for cough. Negative for chest tightness, shortness of breath, wheezing and stridor.   Cardiovascular: Negative for chest pain, palpitations and leg swelling.  Gastrointestinal: Positive for nausea and vomiting. Negative for abdominal pain, constipation and diarrhea.  Genitourinary: Positive for dysuria.  Musculoskeletal: Negative for back pain, neck pain and neck stiffness.  Neurological: Negative for dizziness, light-headedness and headaches.  Psychiatric/Behavioral: Positive for confusion. Negative for agitation.  All other systems reviewed and are negative.    Physical Exam Updated Vital Signs BP 134/66   Pulse 68   Temp 97.9 F (36.6 C) (Oral)   Resp 18   Ht 5\' 3"  (1.6 m)   Wt 40.8 kg (90 lb)   SpO2 99%   BMI 15.94 kg/m   Physical Exam  Constitutional: She appears well-developed and well-nourished. No distress.  HENT:  Head: Normocephalic and atraumatic.  Nose: Nose normal.  Mouth/Throat: Oropharynx is clear and moist. No oropharyngeal exudate.  Eyes: EOM are normal. Pupils are equal, round, and reactive to light.  Neck: Normal range of motion.  Cardiovascular: Normal rate and intact distal pulses.  Murmur heard. Pulmonary/Chest: Effort normal and breath sounds normal. No stridor. No  respiratory distress. She has no wheezes. She has no rales. She exhibits no tenderness.  Abdominal: Bowel sounds are normal. She exhibits no distension. There is no tenderness.  Musculoskeletal: She exhibits no edema or tenderness.  Neurological: She is alert. She is disoriented. She displays no tremor. No cranial nerve deficit or sensory deficit. She exhibits normal muscle tone. Coordination normal. GCS eye subscore is 4. GCS verbal subscore is 5. GCS motor subscore is 6.  Skin: Skin is warm. Capillary refill takes less than 2 seconds. No rash noted. She is not diaphoretic. No erythema.  Psychiatric: She has a normal mood and affect.  Nursing note and vitals reviewed.    ED Treatments / Results  Labs (all labs ordered are listed, but only abnormal results are displayed) Labs Reviewed  CBC WITH DIFFERENTIAL/PLATELET - Abnormal; Notable for the following components:      Result Value   HCT 34.2 (*)    MCHC 36.5 (*)  All other components within normal limits  COMPREHENSIVE METABOLIC PANEL - Abnormal; Notable for the following components:   Sodium 133 (*)    Chloride 98 (*)    CO2 21 (*)    Glucose, Bld 352 (*)    ALT 12 (*)    Alkaline Phosphatase 183 (*)    GFR calc non Af Amer 59 (*)    All other components within normal limits  AMMONIA - Abnormal; Notable for the following components:   Ammonia <9 (*)    All other components within normal limits  LIPASE, BLOOD - Abnormal; Notable for the following components:   Lipase 201 (*)    All other components within normal limits  URINALYSIS, ROUTINE W REFLEX MICROSCOPIC - Abnormal; Notable for the following components:   Glucose, UA >=500 (*)    Hgb urine dipstick TRACE (*)    Bilirubin Urine SMALL (*)    Ketones, ur >80 (*)    Leukocytes, UA TRACE (*)    All other components within normal limits  URINALYSIS, MICROSCOPIC (REFLEX) - Abnormal; Notable for the following components:   Bacteria, UA RARE (*)    Squamous Epithelial /  LPF 0-5 (*)    All other components within normal limits  GLUCOSE, CAPILLARY - Abnormal; Notable for the following components:   Glucose-Capillary 329 (*)    All other components within normal limits  ACETAMINOPHEN LEVEL - Abnormal; Notable for the following components:   Acetaminophen (Tylenol), Serum <10 (*)    All other components within normal limits  CBG MONITORING, ED - Abnormal; Notable for the following components:   Glucose-Capillary 290 (*)    All other components within normal limits  CBG MONITORING, ED - Abnormal; Notable for the following components:   Glucose-Capillary 235 (*)    All other components within normal limits  I-STAT CG4 LACTIC ACID, ED - Abnormal; Notable for the following components:   Lactic Acid, Venous 0.47 (*)    All other components within normal limits  I-STAT ARTERIAL BLOOD GAS, ED - Abnormal; Notable for the following components:   pH, Arterial 7.315 (*)    pCO2 arterial 30.9 (*)    pO2, Arterial 72.0 (*)    Bicarbonate 15.8 (*)    TCO2 17 (*)    Acid-base deficit 9.0 (*)    All other components within normal limits  URINE CULTURE  CULTURE, BLOOD (ROUTINE X 2)  CULTURE, BLOOD (ROUTINE X 2)  CULTURE, EXPECTORATED SPUTUM-ASSESSMENT  GRAM STAIN  PROTIME-INR  TSH  TROPONIN I  SALICYLATE LEVEL  TROPONIN I  PROCALCITONIN  BLOOD GAS, VENOUS  BLOOD GAS, ARTERIAL  LIPID PANEL  STREP PNEUMONIAE URINARY ANTIGEN  RAPID URINE DRUG SCREEN, HOSP PERFORMED  TROPONIN I  TROPONIN I  CBC  BASIC METABOLIC PANEL  LEGIONELLA PNEUMOPHILA SEROGP 1 UR AG  I-STAT CG4 LACTIC ACID, ED    EKG  EKG Interpretation None       Radiology Dg Chest 2 View  Result Date: 04/08/2017 CLINICAL DATA:  67 year old female with a history of hyperglycemia, cough, altered mental status. Known lung cancer undergoing therapy EXAM: CHEST  2 VIEW COMPARISON:  03/22/2017 FINDINGS: Cardiomediastinal silhouette unchanged in size and contour. Airspace opacity in the right  hilar and infrahilar region. No pneumothorax or pleural effusion. Stigmata of emphysema, with increased retrosternal airspace, flattened hemidiaphragms, increased AP diameter, and hyperinflation on the AP view. Coarsened interstitial markings. No displaced fracture. IMPRESSION: Airspace opacity in the right hilar and infrahilar region in this patient with known  history of cancer undergoing therapy. Opacity may reflect treatment changes, or alternatively a new pneumonia. Emphysema Electronically Signed   By: Corrie Mckusick D.O.   On: 04/08/2017 15:32   Ct Head Wo Contrast  Result Date: 04/08/2017 CLINICAL DATA:  Altered mental status, hyperglycemia, and cough. History of stage IV lung cancer. EXAM: CT HEAD WITHOUT CONTRAST TECHNIQUE: Contiguous axial images were obtained from the base of the skull through the vertex without intravenous contrast. COMPARISON:  Brain MRI 10/27/2015 FINDINGS: Brain: There is no evidence of acute infarct, intracranial hemorrhage, mass, midline shift, or extra-axial fluid collection. The ventricles and sulci are normal. There is at most subtle low-density in the cerebral white matter corresponding to T2 hyperintense foci on prior MRI. Vascular: Calcified atherosclerosis at the skull base. No hyperdense vessel. Skull: No fracture or focal osseous lesion. Sinuses/Orbits: Visualized paranasal sinuses and mastoid air cells are clear. Visualized orbits are unremarkable. Other: None. IMPRESSION: No evidence of acute intracranial abnormality. Electronically Signed   By: Logan Bores M.D.   On: 04/08/2017 15:33    Procedures Procedures (including critical care time)  ED ECG REPORT   Date: 04/08/2017  Rate: 80  Rhythm: normal sinus rhythm  QRS Axis: normal  Intervals: normal  ST/T Wave abnormalities: ST elevations anteriorly  Conduction Disutrbances:nonspecific intraventricular conduction delay  Narrative Interpretation:  No STEMI  Old EKG Reviewed: unchanged  I have personally  reviewed the EKG tracing and agree with the computerized printout as noted.   Medications Ordered in ED Medications  insulin glargine (LANTUS) injection 8 Units (8 Units Subcutaneous Given 04/09/17 0219)  insulin aspart (novoLOG) injection 0-9 Units (not administered)  insulin aspart (novoLOG) injection 0-5 Units (4 Units Subcutaneous Given 04/09/17 0219)  enoxaparin (LOVENOX) injection 40 mg (40 mg Subcutaneous Given 04/09/17 0218)  0.9 %  sodium chloride infusion ( Intravenous New Bag/Given 04/09/17 0219)  acetaminophen (TYLENOL) tablet 650 mg (not administered)    Or  acetaminophen (TYLENOL) suppository 650 mg (not administered)  ondansetron (ZOFRAN) tablet 4 mg (not administered)    Or  ondansetron (ZOFRAN) injection 4 mg (not administered)  ipratropium-albuterol (DUONEB) 0.5-2.5 (3) MG/3ML nebulizer solution 3 mL (not administered)  levofloxacin (LEVAQUIN) IVPB 750 mg (not administered)  nicotine (NICODERM CQ - dosed in mg/24 hr) patch 7 mg (not administered)  sodium chloride 0.9 % bolus 1,000 mL (0 mLs Intravenous Stopped 04/08/17 1701)  levofloxacin (LEVAQUIN) IVPB 750 mg (0 mg Intravenous Stopped 04/08/17 2308)     Initial Impression / Assessment and Plan / ED Course  I have reviewed the triage vital signs and the nursing notes.  Pertinent labs & imaging results that were available during my care of the patient were reviewed by me and considered in my medical decision making (see chart for details).     Tyjae Shvartsman is a 67 y.o. female with a past medical history significant for lung cancer, hypertension, hypothyroidism, and recent diagnosis of diabetes who presents with dysuria, nausea, vomiting, cough, chills, somnolence, and hyperglycemia.  According to patient and family, over the last several days she has become more confused and somnolent and is at times difficult to arouse.  They report the patient has complained of some burning when she urinates as well as had an episode of  nausea and vomiting.  Patient has had a dry cough for the last few days but has had no significant hemoptysis or production of cough.  Patient has had chills at home but has had no documented fevers.  Patient has  had decreased oral intake but is still having episodes of hyperglycemia.  Patient was told that she has a abnormality on her pancreas that may be contributing to her symptoms  Family reports glucose has been in the 500s over the last several days but was found to be in the 200s  at PCP office.  Patient was sent down for evaluation and likely admission for somnolence.  On exam, patient is arousable but is disoriented.  Patient had no focal neurologic deficits otherwise.  Lungs were clear and chest and abdomen were nontender.  EKG showed no STEMI.  CT of the head was performed given somnolence and history of cancer to look for edema or metastasis.  No metastasis was seen the patient may need further imaging.  Urinalysis did show evidence of UTI given her symptom rate showed possible pneumonia in the setting of her known cough.  Patient given levofloxacin to cover for UTI.  Given somnolence persisting, patient admitted for rehydration and further management of her actions.  Do not feel patient has encephalitis or meningitis at this time based on exam.  Lipase was elevated however abdomen was nontender.  Patient and family agreed with plan of admission.  Do not feel patient has DKA.  Patient admitted in stable condition for further management.    Final Clinical Impressions(s) / ED Diagnoses   Final diagnoses:  Hyperglycemia  Altered mental status, unspecified altered mental status type  Acute cystitis without hematuria  Community acquired pneumonia, unspecified laterality  Somnolence     Clinical Impression: 1. Hyperglycemia   2. Altered mental status, unspecified altered mental status type   3. Acute cystitis without hematuria   4. Community acquired pneumonia, unspecified  laterality   5. Somnolence     Disposition: Admit  This note was prepared with assistance of Dragon voice recognition software. Occasional wrong-word or sound-a-like substitutions may have occurred due to the inherent limitations of voice recognition software.      Tomasita Beevers, Gwenyth Allegra, MD 04/09/17 [phone]

## 2017-04-08 NOTE — Progress Notes (Signed)
Chief Complaint  Patient presents with  . Fatigue  . Hyperglycemia    Pt states her BG was 286 around 1300 1330 today. BG level was 505 over the weekend. Was seen at [facility] long   . Anorexia    Subjective:  Patient is a 67 y.o. female here for elevated BS's.  She is here with her son.  She has a history of stage IV lung adenocarcinoma.  Recent scan shows a 6 mm lesion in the tail of her pancreas.  Recently dx'd w diabetes.  She was placed on metformin angle indapamide with no change in her home readings.  They have been ranging from the 200s-500s.  She went to the emergency department a couple days ago, improved with fluids.  There is no anion gap so DKA was not as large of concern.  Today, she has been poorly arousable.  She seems to fall asleep easily.  Her oral intake is poor.  She denies any abdominal pain or fevers.  ROS: Endo: +polyuria Lungs: Denies SOB   Family History  Problem Relation Age of Onset  . Colon cancer Neg Hx   . Esophageal cancer Neg Hx   . Rectal cancer Neg Hx   . Stomach cancer Neg Hx    Past Medical History:  Diagnosis Date  . Adenocarcinoma of right lung, stage 4 ([place]) 11/30/2015  . Complication of anesthesia    hard to wake up  . Diabetes mellitus type 2 in nonobese (HCC)   . Eczema   . Encounter for antineoplastic chemotherapy 11/30/2015  . Heart murmur    told at age 26 but never has had any problems  . History of radiation therapy 01/26/16-02/07/16   right pelvis 30 Gy in 10 fractions  . HTN (hypertension) 02/01/2016  . 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 Iron deficiency anemia due to chronic blood loss 12/10/2016   Allergies  Allergen Reactions  . Penicillins Swelling    SWELLING REACTION UNSPECIFIED  Has patient had a PCN reaction causing immediate rash, facial/tongue/throat swelling, SOB or lightheadedness with hypotension: Yes Has patient had a PCN reaction causing severe rash involving mucus membranes or skin necrosis: No Has patient  had a PCN reaction that required hospitalization No Has patient had a PCN reaction occurring within the last 10 years: No If all of the above answers are "NO", then may proceed with Cephalosporin use.    Current Outpatient Medications:  .  aspirin 325 MG tablet, Take 325 mg by mouth daily., Disp: , Rfl:  .  cetirizine (ZYRTEC) 10 MG tablet, TAKE 1 TABLET(10 MG) BY MOUTH DAILY, Disp: 30 tablet, Rfl: 0 .  dicyclomine (BENTYL) 10 MG capsule, Take 1 capsule (10 mg total) by mouth 4 (four) times daily -  before meals and at bedtime., Disp: 30 capsule, Rfl: 0 .  ferrous sulfate 325 (65 FE) MG tablet, Take 325 mg by mouth daily., Disp: , Rfl:  .  glimepiride (AMARYL) 2 MG tablet, TAKE 1 TABLET(2 MG) BY MOUTH DAILY WITH BREAKFAST, Disp: 90 tablet, Rfl: 0 .  hydrocortisone cream 0.5 %, Apply 1 application topically 2 (two) times daily., Disp: 30 g, Rfl: 2 .  levothyroxine (SYNTHROID, LEVOTHROID) 100 MCG tablet, TAKE 1 TABLET(100 MCG) BY MOUTH DAILY BEFORE BREAKFAST, Disp: 30 tablet, Rfl: 0 .  meloxicam (MOBIC) 7.5 MG tablet, TAKE 1 TABLET(7.5 MG) BY MOUTH DAILY, Disp: 90 tablet, Rfl: 1 .  Multiple Vitamin (MULTIVITAMIN) tablet, Take 1 tablet by mouth daily., Disp: , Rfl:  .  ondansetron (ZOFRAN) 4 MG tablet, Take 1 tablet (4 mg total) by mouth every 8 (eight) hours as needed., Disp: 20 tablet, Rfl: 0 .  traMADol (ULTRAM) 50 MG tablet, Take 1 tablet (50 mg total) by mouth every 12 (twelve) hours as needed (Pain)., Disp: 30 tablet, Rfl: 0   Objective: BP (!) 106/50   Pulse 98   Temp 98.7 F (37.1 C) (Oral)   Ht 5\' 3"  (1.6 m)   Wt 90 lb (40.8 kg)   SpO2 98%   BMI 15.94 kg/m  General: Somnolent, arousable Heart: RRR Lungs: CTAB, no rales, wheezes or rhonchi. No accessory muscle use Psych: Difficult to ascertain  Assessment and Plan: Type 2 diabetes mellitus with hyperglycemia, unspecified whether long term insulin use (HCC) - Plan: POCT Glucose (CBG), Insulin Glargine (LANTUS) 100 UNIT/ML  Solostar Pen  Somnolence - Plan: POCT Glucose (CBG)  Orders as above. The patient is somnolent though arousable on exam.  I have never seen her like this, she has always been alert and oriented.  Her sugar is 317 today.  We will send her down to the emergency department for further evaluation. If she is deemed well enough to go home, I will start a low-dose of basal insulin at home.  I would like her to follow-up in 1 week to recheck her sugars.  We may need to start bolus insulin as well.  Check sugars at home, write them down, bring to appointment.  If she is admitted, disregard the plan until her hospital follow-up. The patient's son voiced understanding and agreement to the plan.  [place], DO 04/08/17  2:47 PM

## 2017-04-09 ENCOUNTER — Encounter (HOSPITAL_COMMUNITY): Payer: Self-pay

## 2017-04-09 ENCOUNTER — Other Ambulatory Visit: Payer: Self-pay

## 2017-04-09 DIAGNOSIS — E119 Type 2 diabetes mellitus without complications: Secondary | ICD-10-CM

## 2017-04-09 DIAGNOSIS — K859 Acute pancreatitis without necrosis or infection, unspecified: Secondary | ICD-10-CM | POA: Diagnosis present

## 2017-04-09 DIAGNOSIS — Z923 Personal history of irradiation: Secondary | ICD-10-CM | POA: Diagnosis not present

## 2017-04-09 DIAGNOSIS — K869 Disease of pancreas, unspecified: Secondary | ICD-10-CM | POA: Diagnosis not present

## 2017-04-09 DIAGNOSIS — Z880 Allergy status to penicillin: Secondary | ICD-10-CM | POA: Diagnosis not present

## 2017-04-09 DIAGNOSIS — G9340 Encephalopathy, unspecified: Secondary | ICD-10-CM | POA: Diagnosis present

## 2017-04-09 DIAGNOSIS — Z7982 Long term (current) use of aspirin: Secondary | ICD-10-CM | POA: Diagnosis not present

## 2017-04-09 DIAGNOSIS — K858 Other acute pancreatitis without necrosis or infection: Secondary | ICD-10-CM | POA: Diagnosis not present

## 2017-04-09 DIAGNOSIS — Z791 Long term (current) use of non-steroidal anti-inflammatories (NSAID): Secondary | ICD-10-CM | POA: Diagnosis not present

## 2017-04-09 DIAGNOSIS — Z90710 Acquired absence of both cervix and uterus: Secondary | ICD-10-CM | POA: Diagnosis not present

## 2017-04-09 DIAGNOSIS — R739 Hyperglycemia, unspecified: Secondary | ICD-10-CM

## 2017-04-09 DIAGNOSIS — J189 Pneumonia, unspecified organism: Secondary | ICD-10-CM

## 2017-04-09 DIAGNOSIS — E871 Hypo-osmolality and hyponatremia: Secondary | ICD-10-CM | POA: Diagnosis present

## 2017-04-09 DIAGNOSIS — R011 Cardiac murmur, unspecified: Secondary | ICD-10-CM | POA: Diagnosis present

## 2017-04-09 DIAGNOSIS — Z681 Body mass index (BMI) 19 or less, adult: Secondary | ICD-10-CM | POA: Diagnosis not present

## 2017-04-09 DIAGNOSIS — C3491 Malignant neoplasm of unspecified part of right bronchus or lung: Secondary | ICD-10-CM | POA: Diagnosis not present

## 2017-04-09 DIAGNOSIS — E039 Hypothyroidism, unspecified: Secondary | ICD-10-CM

## 2017-04-09 DIAGNOSIS — E876 Hypokalemia: Secondary | ICD-10-CM | POA: Diagnosis present

## 2017-04-09 DIAGNOSIS — R300 Dysuria: Secondary | ICD-10-CM | POA: Diagnosis present

## 2017-04-09 DIAGNOSIS — M549 Dorsalgia, unspecified: Secondary | ICD-10-CM | POA: Diagnosis present

## 2017-04-09 DIAGNOSIS — Z90721 Acquired absence of ovaries, unilateral: Secondary | ICD-10-CM | POA: Diagnosis not present

## 2017-04-09 DIAGNOSIS — G9341 Metabolic encephalopathy: Secondary | ICD-10-CM | POA: Diagnosis present

## 2017-04-09 DIAGNOSIS — E440 Moderate protein-calorie malnutrition: Secondary | ICD-10-CM | POA: Diagnosis present

## 2017-04-09 DIAGNOSIS — R400 Somnolence: Secondary | ICD-10-CM | POA: Diagnosis present

## 2017-04-09 DIAGNOSIS — Z794 Long term (current) use of insulin: Secondary | ICD-10-CM | POA: Diagnosis not present

## 2017-04-09 DIAGNOSIS — D500 Iron deficiency anemia secondary to blood loss (chronic): Secondary | ICD-10-CM | POA: Diagnosis present

## 2017-04-09 DIAGNOSIS — Z87891 Personal history of nicotine dependence: Secondary | ICD-10-CM | POA: Diagnosis not present

## 2017-04-09 DIAGNOSIS — E1165 Type 2 diabetes mellitus with hyperglycemia: Secondary | ICD-10-CM | POA: Diagnosis present

## 2017-04-09 DIAGNOSIS — I10 Essential (primary) hypertension: Secondary | ICD-10-CM | POA: Diagnosis present

## 2017-04-09 DIAGNOSIS — Z720 Tobacco use: Secondary | ICD-10-CM

## 2017-04-09 DIAGNOSIS — Z79890 Hormone replacement therapy (postmenopausal): Secondary | ICD-10-CM | POA: Diagnosis not present

## 2017-04-09 LAB — BASIC METABOLIC PANEL
Anion gap: 11 (ref 5–15)
BUN: 12 mg/dL (ref 6–20)
CALCIUM: 8.6 mg/dL — AB (ref 8.9–10.3)
CO2: 20 mmol/L — AB (ref 22–32)
CREATININE: 0.82 mg/dL (ref 0.44–1.00)
Chloride: 107 mmol/L (ref 101–111)
GFR calc non Af Amer: >60 mL/min (ref >60)
GLUCOSE: 79 mg/dL (ref 65–99)
Potassium: 3.2 mmol/L — ABNORMAL LOW (ref 3.5–5.1)
Sodium: 138 mmol/L (ref 135–145)

## 2017-04-09 LAB — GLUCOSE, CAPILLARY
GLUCOSE-CAPILLARY: 75 mg/dL (ref 65–99)
GLUCOSE-CAPILLARY: 195 mg/dL — AB (ref 65–99)
Glucose-Capillary: 329 mg/dL — ABNORMAL HIGH (ref 65–99)
Glucose-Capillary: 309 mg/dL — ABNORMAL HIGH (ref 65–99)
Glucose-Capillary: 336 mg/dL — ABNORMAL HIGH (ref 65–99)

## 2017-04-09 LAB — CBC
HEMATOCRIT: 31.2 % — AB (ref 36.0–46.0)
HEMOGLOBIN: 11.4 g/dL — AB (ref 12.0–15.0)
MCH: 28.6 pg (ref 26.0–34.0)
MCHC: 36.5 g/dL — AB (ref 30.0–36.0)
MCV: 78.4 fL (ref 78.0–100.0)
Platelets: 289 10*3/uL (ref 150–400)
RBC: 3.98 MIL/uL (ref 3.87–5.11)
RDW: 13.3 % (ref 11.5–15.5)
WBC: 6.2 10*3/uL (ref 4.0–10.5)

## 2017-04-09 LAB — LIPID PANEL
CHOL/HDL RATIO: 3.5 ratio
CHOLESTEROL: 177 mg/dL (ref 0–200)
HDL: 50 mg/dL (ref >40)
LDL Cholesterol: 108 mg/dL — ABNORMAL HIGH (ref 0–99)
Triglycerides: 95 mg/dL (ref <150)
VLDL: 19 mg/dL (ref 0–40)

## 2017-04-09 LAB — PROCALCITONIN: Procalcitonin: <0.1 ng/mL

## 2017-04-09 LAB — URINE CULTURE

## 2017-04-09 LAB — TROPONIN I
Troponin I: <0.03 ng/mL (ref <0.03)
Troponin I: <0.03 ng/mL (ref <0.03)
Troponin I: <0.03 ng/mL (ref <0.03)

## 2017-04-09 LAB — EXPECTORATED SPUTUM ASSESSMENT W GRAM STAIN, RFLX TO RESP C

## 2017-04-09 LAB — RAPID URINE DRUG SCREEN, HOSP PERFORMED
AMPHETAMINES: NOT DETECTED
BARBITURATES: NOT DETECTED
BENZODIAZEPINES: NOT DETECTED
COCAINE: NOT DETECTED
OPIATES: POSITIVE — AB
TETRAHYDROCANNABINOL: NOT DETECTED

## 2017-04-09 LAB — EXPECTORATED SPUTUM ASSESSMENT W REFEX TO RESP CULTURE

## 2017-04-09 LAB — SALICYLATE LEVEL: Salicylate Lvl: <7 mg/dL (ref 2.8–30.0)

## 2017-04-09 LAB — STREP PNEUMONIAE URINARY ANTIGEN: Strep Pneumo Urinary Antigen: NEGATIVE

## 2017-04-09 LAB — ACETAMINOPHEN LEVEL: Acetaminophen (Tylenol), Serum: <10 ug/mL — ABNORMAL LOW (ref 10–30)

## 2017-04-09 MED ORDER — ACETAMINOPHEN 650 MG RE SUPP: 650.0000 mg | Freq: Four times a day (QID) | RECTAL | Status: DC | PRN

## 2017-04-09 MED ORDER — LIVING WELL WITH DIABETES BOOK
Freq: Once | Status: AC
  Administered 2017-04-09: 18:00:00
  Filled 2017-04-09: qty 1

## 2017-04-09 MED ORDER — INSULIN ASPART 100 UNIT/ML ~~LOC~~ SOLN
0.0000 [IU] | Freq: Every day | SUBCUTANEOUS | Status: DC
  Administered 2017-04-09: 4 [IU] via SUBCUTANEOUS

## 2017-04-09 MED ORDER — LEVOFLOXACIN IN D5W 750 MG/150ML IV SOLN
750.0000 mg | INTRAVENOUS | Status: DC
  Administered 2017-04-10: 750 mg via INTRAVENOUS
  Filled 2017-04-09: qty 150

## 2017-04-09 MED ORDER — INSULIN GLARGINE 100 UNIT/ML ~~LOC~~ SOLN
8.0000 [IU] | Freq: Every day | SUBCUTANEOUS | Status: DC
  Administered 2017-04-09 – 2017-04-10 (×3): 8 [IU] via SUBCUTANEOUS
  Filled 2017-04-09 (×4): qty 0.08

## 2017-04-09 MED ORDER — ONDANSETRON HCL 4 MG/2ML IJ SOLN: 4.0000 mg | Freq: Four times a day (QID) | INTRAMUSCULAR | Status: DC | PRN

## 2017-04-09 MED ORDER — PREMIER PROTEIN SHAKE
11.0000 [oz_av] | ORAL | Status: DC
  Filled 2017-04-09 (×3): qty 325.31

## 2017-04-09 MED ORDER — INSULIN ASPART 100 UNIT/ML ~~LOC~~ SOLN
0.0000 [IU] | Freq: Three times a day (TID) | SUBCUTANEOUS | Status: DC
  Administered 2017-04-09 (×2): 7 [IU] via SUBCUTANEOUS
  Administered 2017-04-10: 2 [IU] via SUBCUTANEOUS
  Administered 2017-04-10: 3 [IU] via SUBCUTANEOUS
  Administered 2017-04-10: 2 [IU] via SUBCUTANEOUS
  Administered 2017-04-11: 5 [IU] via SUBCUTANEOUS
  Administered 2017-04-11: 3 [IU] via SUBCUTANEOUS

## 2017-04-09 MED ORDER — ENOXAPARIN SODIUM 40 MG/0.4ML ~~LOC~~ SOLN
40.0000 mg | Freq: Every day | SUBCUTANEOUS | Status: DC
  Administered 2017-04-09: 40 mg via SUBCUTANEOUS
  Filled 2017-04-09: qty 0.4

## 2017-04-09 MED ORDER — IPRATROPIUM-ALBUTEROL 0.5-2.5 (3) MG/3ML IN SOLN: 3.0000 mL | RESPIRATORY_TRACT | Status: DC | PRN

## 2017-04-09 MED ORDER — NICOTINE 7 MG/24HR TD PT24
7.0000 mg | MEDICATED_PATCH | Freq: Every day | TRANSDERMAL | Status: DC
  Administered 2017-04-09 – 2017-04-11 (×3): 7 mg via TRANSDERMAL
  Filled 2017-04-09 (×3): qty 1

## 2017-04-09 MED ORDER — MORPHINE SULFATE (PF) 4 MG/ML IV SOLN
0.5000 mg | INTRAVENOUS | Status: DC | PRN
  Administered 2017-04-10: 1 mg via INTRAVENOUS
  Filled 2017-04-09: qty 1

## 2017-04-09 MED ORDER — SODIUM CHLORIDE 0.9 % IV SOLN
INTRAVENOUS | Status: DC
  Administered 2017-04-09 – 2017-04-10 (×5): via INTRAVENOUS

## 2017-04-09 MED ORDER — ONDANSETRON HCL 4 MG PO TABS: 4.0000 mg | ORAL_TABLET | Freq: Four times a day (QID) | ORAL | Status: DC | PRN

## 2017-04-09 MED ORDER — ENOXAPARIN SODIUM 30 MG/0.3ML ~~LOC~~ SOLN
30.0000 mg | Freq: Every day | SUBCUTANEOUS | Status: DC
  Administered 2017-04-10 – 2017-04-11 (×2): 30 mg via SUBCUTANEOUS
  Filled 2017-04-09 (×2): qty 0.3

## 2017-04-09 MED ORDER — ACETAMINOPHEN 325 MG PO TABS
650.0000 mg | ORAL_TABLET | Freq: Four times a day (QID) | ORAL | Status: DC | PRN
  Administered 2017-04-09 (×2): 650 mg via ORAL
  Filled 2017-04-09 (×2): qty 2

## 2017-04-09 NOTE — Care Management Obs Status (Signed)
[facility] NOTIFICATION   Patient Details  Name: Lynnel Zanetti MRN: [id number] Date of Birth: [birth year]   Medicare Observation Status Notification Given:  Yes    MahabirJuliann Pulse, RN 04/09/2017, 3:08 PM

## 2017-04-09 NOTE — Progress Notes (Signed)
Inpatient Diabetes Program Recommendations  AACE/ADA: New Consensus Statement on Inpatient Glycemic Control (2015)  Target Ranges:  Prepandial:   less than 140 mg/dL      Peak postprandial:   less than 180 mg/dL (1-2 hours)      Critically ill patients:  140 - 180 mg/dL   Lab Results  Component Value Date   GLUCAP 309 (H) 04/09/2017   HGBA1C 7.2 (H) 03/25/2017    Review of Glycemic Control  Diabetes history: New-onset DM - diagnosed 2 weeks ago Outpatient Diabetes medications:Amaryl, previously on metformin Current orders for Inpatient glycemic control: Lantus 8 units QD, Novolog 0-9 units tidwc and hs  Inpatient Diabetes Program Recommendations:     Add Novolog 3 units tidwc for meal coverage insulin if pt eats > 50% meal Will order Living Well With Diabetes book. Will f/u with discussion regarding monitoring and s/s of hypoglycemia in am. Will order OP Diabetes Education consult for DM  Will teach insulin administration with pen in am.  Continue to follow.  Thank you. Lorenda Peck, RD, LDN, CDE Inpatient Diabetes Coordinator [phone]

## 2017-04-09 NOTE — Plan of Care (Signed)
  Activity: Risk for activity intolerance will decrease 04/09/2017 1137 - Progressing by Dorene Sorrow, RN   Nutrition: Adequate nutrition will be maintained 04/09/2017 1137 - Progressing by Dorene Sorrow, RN   Elimination: Will not experience complications related to bowel motility 04/09/2017 1137 - Progressing by Dorene Sorrow, RN

## 2017-04-09 NOTE — Progress Notes (Signed)
Initial Nutrition Assessment  DOCUMENTATION CODES:   Non-severe (moderate) malnutrition in context of chronic illness, Underweight  INTERVENTION:  - Will trial Premier Protein once/day, this supplement provides 160 kcal, 5 grams of carbohydrate, and 30 grams of protein. - Continue to encourage PO intakes. - DM diet education provided.  - Recommend outpatient DM diet education referral.   NUTRITION DIAGNOSIS:   Moderate Malnutrition related to chronic illness, catabolic illness, cancer and cancer related treatments as evidenced by moderate fat depletion, moderate muscle depletion.   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 Earlie Server, type 2 diabetes mellitus, hypertension who came in from her PCPs office due to elevated CBGs.  She was diagnosed with diabetes mellitus couple weeks ago seems to have failed outpatient oral agents  Per chart review, pt consumed 15% of breakfast. Pt feels that she ate much more of breakfast and states that she had whole wheat toast with sugar-free jelly, sausage, and iced tea but did not consume cream of wheat because she did not like them. Lunch tray was delivered during RD visit.   Pt reports that PTA she had a decreased appetite and intakes d/t lethargy but she was unable to give further detail on this. She reports that she is feeling better but is still "groggy" at this time. She denies nausea now or any time PTA. She reports currently having a pressure-like pain to upper chest, R flank, and back.   Pt and family, who is at bedside, have been very appreciative of staff in helping pt with meal ordering and knowing what she is able to have at meals in order to not go over carb  limit. Family interested in DM diet education/handouts.   Pt was on the phone when RD returned to the room with handouts. Reviewed "Label Reading Tips for Diabetes" and "Carbohydrate Counting for People with Diabetes" handouts in detail with family member in the room. Discussed the importance of protein with all meals and snacks. He asked about protein shakes such as Ensure or Boost and this topic was discussed in detail. He denied further nutrition-related questions or concerns.   Physical assessment outlined below. Per chart review, pt weighed 100 lbs on 01/14/17. Based on current weight, this indicates 9 lb weight loss (9% wt loss) in the past 2 months which is significant for time frame.  Medications reviewed; sliding scale Novolog, 8 units Lantus/day.  Labs reviewed; CBGs: 329, 75, and 309 mg/dL today, K: 3.2 mmol/L, Ca: 8.6 mg/dL.  IVF: NS @ 125 mL/hr.       NUTRITION - FOCUSED PHYSICAL EXAM:    Most Recent Value  Orbital Region  No depletion  Upper Arm Region  Moderate depletion  Thoracic and Lumbar Region  Unable to assess  Buccal Region  Mild depletion  Temple Region  Mild depletion  Clavicle Bone Region  Moderate depletion  Clavicle and Acromion Bone Region  Moderate depletion  Scapular Bone Region  Mild depletion  Dorsal Hand  Mild depletion  Patellar Region  Unable to assess  Anterior Thigh Region  Unable to assess  Posterior Calf Region  Unable to assess  Edema (RD Assessment)  Unable to assess  Hair  Reviewed  Eyes  Reviewed  Mouth  Reviewed  Skin  Reviewed  Nails  Reviewed       Diet Order:  Aspiration precautions Diet Carb Modified Fluid consistency: Thin; Room service appropriate? Yes  EDUCATION NEEDS:   Education needs have been addressed  Skin:  Skin Assessment: Reviewed RN Assessment  Last BM:  2/10  Height:   Ht Readings from Last 1 Encounters:  04/09/17 5\' 3"  (1.6 m)    Weight:   Wt Readings from Last 1 Encounters:  04/09/17 91 lb 14.9  oz (41.7 kg)    Ideal Body Weight:  52.27 kg  BMI:  Body mass index is 16.29 kg/m.  Estimated Nutritional Needs:   Kcal:  1460-1670 (35-40 kcal/kg)  Protein:  62-75 grams (1.5-1.8 grams/kg)  Fluid:  >/= 1.7 L/day      Brittany Matin, MS, RD, LDN, [facility] Inpatient Clinical Dietitian Pager # [phone] After hours/weekend pager # [phone]

## 2017-04-09 NOTE — H&P (Addendum)
History and Physical    Brittany Mercado NLZ:[id number] DOB: Jan 18, 1951 DOA: 04/08/2017  Referring MD/NP/PA: Dr. Steward Ros PCP: [name], DO  Patient coming from: [facility] transfer  Chief Complaint: Elevated blood sugars  I have personally briefly reviewed patient's old medical records in [place]   HPI: Brittany Mercado is a 67 y.o. female with medical history significant of adenocarcinoma of the right lung s/p radiation on chemotherapy followed by Dr. Earlie Server, DM type II, HTN; who presented with complaints of elevated blood sugars.  She was initially diagnosed with diabetes 2 weeks ago.  She was given a glucometer and initially started on metformin.  She was later switched to Amaryl on about 1 week ago after seeing her PCP.  Despite taking medications as advised she reports persistent blood sugars in the 300s.  She was seen in the emergency department 3 days ago and at that time given 2 L of normal saline IV fluids and 10 units of insulin  Aspart prior to being discharged home.  However, after getting home patient reported elevation of blood sugars.  She reports having poor oral intake with early satiety and questions how her blood sugars are remaining elevated.  Associated symptoms include dysuria, mild shortness of breath, productive cough with green sputum over the last few weeks, and sharp pain radiating into the back.  Denies having any vomiting, diarrhea, palpitations, or loss of consciousness.   ED Course: Upon admission into the emergency department patient was noted to be afebrile, pulse 68 and 98, respiration 14-24, blood pressure 106/52-154/56, O2 saturation 95-100% on room air.  Labs revealed normal CBC, sodium 133, potassium 4.1, BUN 98, CO2 21, BUN 15, creatinine 0.98, glucose 352, ammonia 9, lipase 201.  Urinalysis was positive for glucose, ketones, trace leukocytes, and rare bacteria.  CT scan of the brain showed no acute abnormalities chest x-ray revealed signs of  possible right hilar and infrahilar infiltrate suggestive of possible pneumonia.  Patient was initially noted to be somnolent, but is more awake upon arrival to [facility].  Review of Systems  Constitutional: Positive for malaise/fatigue. Negative for fever.  HENT: Negative for ear discharge and nosebleeds.   Eyes: Negative for photophobia and pain.  Respiratory: Positive for cough, sputum production and shortness of breath.   Cardiovascular: Negative for chest pain and leg swelling.  Gastrointestinal: Negative for abdominal pain, nausea and vomiting.  Genitourinary: Positive for dysuria and frequency.  Musculoskeletal: Positive for back pain and myalgias.  Skin: Negative for itching and rash.  Neurological: Positive for weakness. Negative for focal weakness and seizures.  Psychiatric/Behavioral: Positive for substance abuse. Negative for memory loss.    Past Medical History:  Diagnosis Date  . Adenocarcinoma of right lung, stage 4 ([place]) 11/30/2015  . Complication of anesthesia    hard to wake up  . Diabetes mellitus type 2 in nonobese (HCC)   . Eczema   . Encounter for antineoplastic chemotherapy 11/30/2015  . Heart murmur    told at age 75 but never has had any problems  . History of radiation therapy 01/26/16-02/07/16   right pelvis 30 Gy in 10 fractions  . HTN (hypertension) 02/01/2016  . 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 Iron deficiency anemia due to chronic blood loss 12/10/2016    Past Surgical History:  Procedure Laterality Date  . ABDOMINAL HYSTERECTOMY    . OOPHORECTOMY  1996   tumor removed from right ovary  . TONSILLECTOMY    . VIDEO BRONCHOSCOPY WITH ENDOBRONCHIAL NAVIGATION N/A  11/18/2015   Procedure: VIDEO BRONCHOSCOPY WITH ENDOBRONCHIAL NAVIGATION;  Surgeon: Melrose Nakayama, MD;  Location: [place];  Service: Thoracic;  Laterality: N/A;  . VIDEO BRONCHOSCOPY WITH ENDOBRONCHIAL ULTRASOUND N/A 11/18/2015   Procedure: VIDEO BRONCHOSCOPY WITH ENDOBRONCHIAL  ULTRASOUND;  Surgeon: Melrose Nakayama, MD;  Location: [place];  Service: Thoracic;  Laterality: N/A;     reports that she quit smoking about 17 months ago. She has a 15.00 pack-year smoking history. she has never used smokeless tobacco. She reports that she does not drink alcohol or use drugs.  Allergies  Allergen Reactions  . Penicillins Swelling    SWELLING REACTION UNSPECIFIED  Has patient had a PCN reaction causing immediate rash, facial/tongue/throat swelling, SOB or lightheadedness with hypotension: Yes Has patient had a PCN reaction causing severe rash involving mucus membranes or skin necrosis: No Has patient had a PCN reaction that required hospitalization No Has patient had a PCN reaction occurring within the last 10 years: No If all of the above answers are "NO", then may proceed with Cephalosporin use.    Family History  Problem Relation Age of Onset  . Colon cancer Neg Hx   . Esophageal cancer Neg Hx   . Rectal cancer Neg Hx   . Stomach cancer Neg Hx     Prior to Admission medications   Medication Sig Start Date End Date Taking? Authorizing Provider  aspirin 325 MG tablet Take 325 mg by mouth daily.   Yes [provider]  cetirizine (ZYRTEC) 10 MG tablet TAKE 1 TABLET(10 MG) BY MOUTH DAILY 03/04/17  Yes Curt Bears, MD  dicyclomine (BENTYL) 10 MG capsule Take 1 capsule (10 mg total) by mouth 4 (four) times daily -  before meals and at bedtime. 03/21/17  Yes [name], DO  ferrous sulfate 325 (65 FE) MG tablet Take 325 mg by mouth daily.   Yes [provider]  levothyroxine (SYNTHROID, LEVOTHROID) 100 MCG tablet TAKE 1 TABLET(100 MCG) BY MOUTH DAILY BEFORE BREAKFAST 03/04/17  Yes Curt Bears, MD  meloxicam (MOBIC) 7.5 MG tablet TAKE 1 TABLET(7.5 MG) BY MOUTH DAILY 03/21/17  Yes [name], DO  Multiple Vitamin (MULTIVITAMIN) tablet Take 1 tablet by mouth daily.   Yes [provider]  traMADol (ULTRAM) 50 MG  tablet Take 1 tablet (50 mg total) by mouth every 12 (twelve) hours as needed (Pain). 04/01/17  Yes [name], DO  hydrocortisone cream 0.5 % Apply 1 application topically 2 (two) times daily. Patient not taking: Reported on 04/09/2017 03/26/16   Maryanna Shape, NP  Insulin Glargine (LANTUS) 100 UNIT/ML Solostar Pen Inject 8 Units into the skin daily at 10 pm. 04/08/17   Nani Ravens, Crosby Oyster, DO    Physical Exam:  Constitutional: Frail cachectic appearing female who appears older than stated age 26:   04/08/17 2200 04/08/17 2300 04/08/17 2350 04/09/17 0003  BP: 107/76 (!) 125/57 (!) 151/56   Pulse:   88   Resp: (!) 24 (!) 24 20   Temp:   98 F (36.7 C)   TempSrc:   Oral   SpO2:   100%   Weight:    41.7 kg (91 lb 14.9 oz)  Height:    5\' 3"  (1.6 m)   Eyes: PERRL, lids and conjunctivae normal ENMT: Mucous membranes are dry. Posterior pharynx clear of any exudate or lesions. .  Neck: normal, supple, no masses, no thyromegaly Respiratory: Normal respiratory effort with positive rhonchi noted on the right mid to  lower lung field. Cardiovascular: Regular rate and rhythm, no murmurs / rubs / gallops. No extremity edema. 2+ pedal pulses. No carotid bruits.  Abdomen: no tenderness, no masses palpated. No hepatosplenomegaly. Bowel sounds positive.  Musculoskeletal: no clubbing / cyanosis. No joint deformity upper and lower extremities. Good ROM, no contractures. Normal muscle tone.  Skin: no rashes, lesions, ulcers. No induration Neurologic: CN 2-12 grossly intact. Sensation intact, DTR normal. Strength 4+/5 in all extremities Psychiatric: Normal judgment and insight. Alert and oriented x 3. Normal mood.     Labs on Admission: I have personally reviewed following labs and imaging studies  CBC: Recent Labs  Lab 04/06/17 1516 04/08/17 1523  WBC 9.6 6.7  NEUTROABS  --  4.6  HGB 14.0 12.5  HCT 38.3 34.2*  MCV 78.0 78.3  PLT 351 166   Basic Metabolic Panel: Recent  Labs  Lab 04/06/17 1516 04/06/17 1902 04/08/17 1523  NA 128* 137 133*  K 4.6 3.6 4.1  CL 90* 108 98*  CO2 20* 20* 21*  GLUCOSE 502* 118* 352*  BUN 33* 25* 15  CREATININE 1.39* 0.95 0.98  CALCIUM 9.9 8.7* 9.3   GFR: Estimated Creatinine Clearance: 37.7 mL/min (by C-G formula based on SCr of 0.98 mg/dL). Liver Function Tests: Recent Labs  Lab 04/06/17 1516 04/08/17 1523  AST 17 18  ALT 12* 12*  ALKPHOS 224* 183*  BILITOT 0.8 0.8  PROT 9.3* 8.1  ALBUMIN 4.0 3.5   Recent Labs  Lab 04/06/17 1516 04/08/17 1523  LIPASE 84* 201*   Recent Labs  Lab 04/08/17 1523  AMMONIA <9*   Coagulation Profile: Recent Labs  Lab 04/08/17 1523  INR 0.93   Cardiac Enzymes: Recent Labs  Lab 04/06/17 1516 04/08/17 1523  TROPONINI <0.03 <0.03   BNP (last 3 results) No results for input(s): PROBNP in the last 8760 hours. HbA1C: No results for input(s): HGBA1C in the last 72 hours. CBG: Recent Labs  Lab 04/06/17 1406 04/06/17 1530 04/06/17 1811 04/08/17 1452 04/08/17 1646  GLUCAP 454* 505* 216* 290* 235*   Lipid Profile: No results for input(s): CHOL, HDL, LDLCALC, TRIG, CHOLHDL, LDLDIRECT in the last 72 hours. Thyroid Function Tests: Recent Labs    04/08/17 1523  TSH 3.024   Anemia Panel: No results for input(s): VITAMINB12, FOLATE, FERRITIN, TIBC, IRON, RETICCTPCT in the last 72 hours. Urine analysis:    Component Value Date/Time   COLORURINE YELLOW 04/08/2017 [place] 04/08/2017 1637   LABSPEC 1.025 04/08/2017 1637   PHURINE 6.0 04/08/2017 1637   GLUCOSEU >=500 (A) 04/08/2017 1637   HGBUR TRACE (A) 04/08/2017 1637   BILIRUBINUR SMALL (A) 04/08/2017 1637   KETONESUR >80 (A) 04/08/2017 1637   PROTEINUR NEGATIVE 04/08/2017 1637   NITRITE NEGATIVE 04/08/2017 1637   LEUKOCYTESUR TRACE (A) 04/08/2017 1637   Sepsis Labs: No results found for this or any previous visit (from the past 240 hour(s)).   Radiological Exams on Admission: Dg Chest  2 View  Result Date: 04/08/2017 CLINICAL DATA:  67 year old female with a history of hyperglycemia, cough, altered mental status. Known lung cancer undergoing therapy EXAM: CHEST  2 VIEW COMPARISON:  03/22/2017 FINDINGS: Cardiomediastinal silhouette unchanged in size and contour. Airspace opacity in the right hilar and infrahilar region. No pneumothorax or pleural effusion. Stigmata of emphysema, with increased retrosternal airspace, flattened hemidiaphragms, increased AP diameter, and hyperinflation on the AP view. Coarsened interstitial markings. No displaced fracture. IMPRESSION: Airspace opacity in the right hilar and infrahilar region in this  patient with known history of cancer undergoing therapy. Opacity may reflect treatment changes, or alternatively a new pneumonia. Emphysema Electronically Signed   By: Corrie Mckusick D.O.   On: 04/08/2017 15:32   Ct Head Wo Contrast  Result Date: 04/08/2017 CLINICAL DATA:  Altered mental status, hyperglycemia, and cough. History of stage IV lung cancer. EXAM: CT HEAD WITHOUT CONTRAST TECHNIQUE: Contiguous axial images were obtained from the base of the skull through the vertex without intravenous contrast. COMPARISON:  Brain MRI 10/27/2015 FINDINGS: Brain: There is no evidence of acute infarct, intracranial hemorrhage, mass, midline shift, or extra-axial fluid collection. The ventricles and sulci are normal. There is at most subtle low-density in the cerebral white matter corresponding to T2 hyperintense foci on prior MRI. Vascular: Calcified atherosclerosis at the skull base. No hyperdense vessel. Skull: No fracture or focal osseous lesion. Sinuses/Orbits: Visualized paranasal sinuses and mastoid air cells are clear. Visualized orbits are unremarkable. Other: None. IMPRESSION: No evidence of acute intracranial abnormality. Electronically Signed   By: Logan Bores M.D.   On: 04/08/2017 15:33    EKG: Independently reviewed.  Sinus rhythm with  LVH  Assessment/Plan Hyperglycemia in diabetes mellitus type 2: Acute.  Patient recently diagnosed with diabetes approximately 3 weeks ago and noted to have a hemoglobin A1c of 7.2 on 03/25/2017.  Family notes persistently elevated blood sugars into the 300s at home. - Admit to a telemetry bed - Carb modified diet as tolerated - Hypoglycemic protocols - Continue Lantus of 8 units and give first dose now - CBGs before meals and nightly with sensitive SSI  - Diabetic education in a.m.  Community acquired pneumonia: Acute.  Patient resents with complaints of productive cough.  Found to have right sided opacities concerning for pneumonia.  On the differential includes aspiration vs. scarring secondary to radiation. - Aspiration precautions - Follow-up blood cultures and sputum - Check pro-calcitonin  - Continue Levaquin for now, and de-escalate when medically appropriate  Pancreatitis with pancreatic mass:  Acute.  Patient complains of pain radiating to the back.  Noted to have elevated lipase of 201 on admission with note of pancreatic mass of approximately 6 cm on CT scan of the abdomen and pelvis on 1/25.  Lipase increased from 84 to 201 over 2 days. - Normal saline IV fluids at 125 mL/hr as tolerated - IV morphine as needed pain - Consider need of GI consult in a.m.  Abnormal EKG: Sinus rhythm with LVH.  Suspect pain to the back likely related with pancreatitis. - Continue to trend troponins - Follow-up telemetry overnight  Adenocarcinoma of the right lung stage IV: Patient reports being on chemotherapy with last treatment approximately 2 weeks ago followed by Dr. Earlie Server. - Dr. Earlie Server added to treatment team  Hyponatremia: Acute.  Patient sodium level noted to be within normal limits when accounting for hyperglycemia.   - Continue to monitor  Hypothyroidism: TSH noted to be within normal limits at 3.024. - Continue levothyroxine     Tobacco abuse: Patient reports only history of  tobacco abuse, but has been cutting back and only smoking approximately 3-4 cigarettes - Counseled on the need of cessation of tobacco use - Nicotine patch offered  Acute encephalopathy: Now resolved.  Patient more alert and less somnolent.  DVT prophylaxis: Lovenox Code Status: Full Family Communication: Discussed plan of care with patient and family present at bedside Disposition Plan: TBD  Consults called: none  Admission status:observation  Norval Morton MD [facility] Pager [phone]  If 7PM-7AM, please contact night-coverage [email or web address] Password [email or web address]  04/09/2017, 1:08 AM

## 2017-04-09 NOTE — Progress Notes (Signed)
Patient seen and examined this morning, admitted overnight with Dr. Tamala Julian, H&P reviewed and agree assessment and plan.  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 Earlie Server, type 2 diabetes mellitus, hypertension who came in from her PCPs office due to elevated CBGs.  She was diagnosed with diabetes mellitus couple weeks ago seems to have failed outpatient oral agents, was seen in the PCPs office yesterday was found to be lethargic as well as hyperglycemic and was directed to come to the hospital.  She was also complaining of cough/chest congestion as well as a productive cough   Type 2 diabetes mellitus with hyperglycemia -Started on Lantus 8 units plus sliding scale, fasting CBG this morning 75, well controlled, continue current regimen -Most recent A1c was 7.2  Community-acquired pneumonia -Symptomatic, with productive cough as well as right-sided opacities on chest x-ray concern for pneumonia -Continue Levaquin, suspect this may play a role in her persistent hypoglycemia  Mild pancreatitis -Unclear etiology, gallbladder looks well without any cholelithiasis, no CBD dilation, AST/ALT/total bilirubin normal. -CT scan of the abdomen and pelvis on 1/25 with 6 mm cystic lesion in the pancreatic tail, will need to be follow-up with imaging as an outpatient. -She has no epigastric pain, no nausea or vomiting.  She does complain of back pain in the middle over the last few weeks -Continue fluids, allow a diet  Stage IV lung adenocarcinoma -Outpatient management  Hypothyroidism -Continue Synthroid  Tobacco abuse -Patient tells me she quit a month ago   Brianny Soulliere M. Cruzita Lederer, MD [facility] [phone]  If 7PM-7AM, please contact night-coverage [email or web address] Password [email or web address]

## 2017-04-09 NOTE — Plan of Care (Signed)
  Clinical Measurements: Diagnostic test results will improve 04/09/2017 2152 - Progressing by Ashley Murrain, RN    Education: Knowledge of General Education information will improve 04/09/2017 2152 - Progressing by Ashley Murrain, RN

## 2017-04-09 NOTE — Plan of Care (Signed)
  Education: Knowledge of General Education information will improve 04/09/2017 0136 - Progressing by Ashley Murrain, RN

## 2017-04-10 ENCOUNTER — Other Ambulatory Visit: Payer: Self-pay | Admitting: Internal Medicine

## 2017-04-10 DIAGNOSIS — E43 Unspecified severe protein-calorie malnutrition: Secondary | ICD-10-CM

## 2017-04-10 LAB — GLUCOSE, CAPILLARY
GLUCOSE-CAPILLARY: 99 mg/dL (ref 65–99)
Glucose-Capillary: 156 mg/dL — ABNORMAL HIGH (ref 65–99)
Glucose-Capillary: 190 mg/dL — ABNORMAL HIGH (ref 65–99)
Glucose-Capillary: 203 mg/dL — ABNORMAL HIGH (ref 65–99)

## 2017-04-10 LAB — CBC
HCT: 29.4 % — ABNORMAL LOW (ref 36.0–46.0)
Hemoglobin: 10.4 g/dL — ABNORMAL LOW (ref 12.0–15.0)
MCH: 27.4 pg (ref 26.0–34.0)
MCHC: 35.4 g/dL (ref 30.0–36.0)
MCV: 77.4 fL — AB (ref 78.0–100.0)
Platelets: 262 10*3/uL (ref 150–400)
RBC: 3.8 MIL/uL — ABNORMAL LOW (ref 3.87–5.11)
RDW: 13.2 % (ref 11.5–15.5)
WBC: 6.3 10*3/uL (ref 4.0–10.5)

## 2017-04-10 LAB — MAGNESIUM: Magnesium: 1.3 mg/dL — ABNORMAL LOW (ref 1.7–2.4)

## 2017-04-10 LAB — LEGIONELLA PNEUMOPHILA SEROGP 1 UR AG: L. pneumophila Serogp 1 Ur Ag: NEGATIVE

## 2017-04-10 LAB — BASIC METABOLIC PANEL
Anion gap: 8 (ref 5–15)
BUN: 6 mg/dL (ref 6–20)
CO2: 19 mmol/L — ABNORMAL LOW (ref 22–32)
Calcium: 7.9 mg/dL — ABNORMAL LOW (ref 8.9–10.3)
Chloride: 110 mmol/L (ref 101–111)
Creatinine, Ser: 0.7 mg/dL (ref 0.44–1.00)
GFR calc Af Amer: >60 mL/min (ref >60)
GLUCOSE: 112 mg/dL — AB (ref 65–99)
POTASSIUM: 2.9 mmol/L — AB (ref 3.5–5.1)
Sodium: 137 mmol/L (ref 135–145)

## 2017-04-10 MED ORDER — INSULIN ASPART 100 UNIT/ML ~~LOC~~ SOLN
3.0000 [IU] | Freq: Three times a day (TID) | SUBCUTANEOUS | Status: DC
  Administered 2017-04-10 – 2017-04-11 (×4): 3 [IU] via SUBCUTANEOUS

## 2017-04-10 MED ORDER — POTASSIUM CHLORIDE CRYS ER 20 MEQ PO TBCR
40.0000 meq | EXTENDED_RELEASE_TABLET | Freq: Four times a day (QID) | ORAL | Status: AC
  Administered 2017-04-10 (×2): 40 meq via ORAL
  Filled 2017-04-10 (×2): qty 2

## 2017-04-10 MED ORDER — MAGNESIUM SULFATE 50 % IJ SOLN
6.0000 g | Freq: Once | INTRAVENOUS | Status: AC
  Administered 2017-04-10: 6 g via INTRAVENOUS
  Filled 2017-04-10: qty 10

## 2017-04-10 NOTE — Progress Notes (Signed)
PROGRESS NOTE    Brittany Mercado  JTT:[id number] DOB: Nov 19, 1950 DOA: 04/08/2017 PCP: [name], DO   Brief Narrative:  In brief, pleasant 67 year old female with history of adenocarcinoma of the right lung post radiation and chemotherapy followed by Dr. Earlie Server, type 2 diabetes mellitus, hypertension who came in from her PCPs office due to elevated CBGs.  She was diagnosed with diabetes mellitus couple weeks ago seems to have failed outpatient oral agents, was seen in the PCPs office yesterday was found to be lethargic as well as hyperglycemic and was directed to come to the hospital.  She was also complaining of cough/chest congestion as well as Regine Christian productive cough  Assessment & Plan:   Principal Problem:   Hyperglycemia Active Problems:   Tobacco abuse   Adenocarcinoma of right lung, stage 4 (HCC)   Hypothyroidism (acquired)   Diabetes mellitus type 2 in nonobese ([place])   Acute encephalopathy   Community acquired pneumonia   Pancreatitis   Malnutrition of moderate degree  Type 2 diabetes mellitus with hyperglycemia -Started on Lantus 8 units plus sliding scale, fasting CBG this morning 156 - added 3 units TID with meals - may d/c just with lantus for simplicity sake, but will closely follow bg's -Most recent A1c was 7.2  Community-acquired pneumonia -Symptomatic, with productive cough as well as right-sided opacities on chest x-ray concern for pneumonia -Continue Levaquin, suspect this may play Delberta Folts role in her persistent hypoglycemia  Acute Metabolic Encephalopathy: seems improved, Abram Sax&ox3.  Possibly 2/2 CAP above.  Mild pancreatitis -Unclear etiology, and unclear significance of elevated lipase without abdominal pain. - CT abdomen/pelvis done 03/22/17 and was without gallstones noted and normal AST, ALT, bili to suggests against obstructive etiology  - follow lipid panel and repeat lipase - no obvious meds - CT scan of the abdomen and pelvis on 1/25 with 6 mm cystic  lesion in the pancreatic tail, will need to be follow-up with imaging as an outpatient. -She has no epigastric pain, no nausea or vomiting.   -Continue fluids, allow Narcissa Melder diet  Abnormal EKG: Sinus rhythm with LVH.  Suspect pain to the back likely related with pancreatitis vs pneumonia. - Continue to trend troponins (negative)  Stage IV lung adenocarcinoma -Outpatient management  Hyponatremia: improved  Hypothyroidism -Continue Synthroid  Tobacco abuse -Patient tells me she quit Treysean Petruzzi month ago  Hypokalemia  Hypomagnesemia: replete and follow   DVT prophylaxis: lovenox Code Status: full  Family Communication: son at bedside Disposition Plan: pending   Consultants:   none  Procedures:   none  Antimicrobials: Anti-infectives (From admission, onward)   Start     Dose/Rate Route Frequency Ordered Stop   04/10/17 2200  levofloxacin (LEVAQUIN) IVPB 750 mg     750 mg 100 mL/hr over 90 Minutes Intravenous Every 48 hours 04/09/17 0123 04/16/17 2159   04/08/17 2000  levofloxacin (LEVAQUIN) IVPB 750 mg     750 mg 100 mL/hr over 90 Minutes Intravenous  Once 04/08/17 1953 04/08/17 2308         Subjective: Feels ok.  No pain. Denies SOB.  Presented due to hyperglycemia and she was confused, altered.  Feels better now.   Objective: Vitals:   04/09/17 2012 04/10/17 0300 04/10/17 0351 04/10/17 1417  BP: (!) 133/45  (!) 124/46 (!) 130/48  Pulse: 88  76 80  Resp: 18  19 18   Temp: 98.2 F (36.8 C)  98 F (36.7 C) 98.5 F (36.9 C)  TempSrc: Oral Oral Oral Oral  SpO2:  100%  100% 100%  Weight:      Height:        Intake/Output Summary (Last 24 hours) at 04/10/2017 1833 Last data filed at 04/10/2017 1310 Gross per 24 hour  Intake 2957 ml  Output -  Net 2957 ml   Filed Weights   04/08/17 1447 04/09/17 0003  Weight: 40.8 kg (90 lb) 41.7 kg (91 lb 14.9 oz)    Examination:  General: No acute distress. Cardiovascular: Heart sounds show Jalie Eiland regular rate, and rhythm. No  gallops or rubs. No murmurs. No JVD. Lungs: No increased wob, crackles to r lung base.   Abdomen: Soft, nontender, nondistended with normal active bowel sounds. No masses. No hepatosplenomegaly. Neurological: Alert and oriented 3. Moves all extremities 4 with equal strength. Cranial nerves II through XII grossly intact. Skin: Warm and dry. No rashes or lesions. Extremities: No clubbing or cyanosis. No edema. Pedal pulses 2+. Psychiatric: Mood and affect are normal. Insight and judgment are appropriate.  Data Reviewed: I have personally reviewed following labs and imaging studies  CBC: Recent Labs  Lab 04/06/17 1516 04/08/17 1523 04/09/17 0736 04/10/17 0448  WBC 9.6 6.7 6.2 6.3  NEUTROABS  --  4.6  --   --   HGB 14.0 12.5 11.4* 10.4*  HCT 38.3 34.2* 31.2* 29.4*  MCV 78.0 78.3 78.4 77.4*  PLT 351 328 289 833   Basic Metabolic Panel: Recent Labs  Lab 04/06/17 1516 04/06/17 1902 04/08/17 1523 04/09/17 0736 04/10/17 0448  NA 128* 137 133* 138 137  K 4.6 3.6 4.1 3.2* 2.9*  CL 90* 108 98* 107 110  CO2 20* 20* 21* 20* 19*  GLUCOSE 502* 118* 352* 79 112*  BUN 33* 25* 15 12 6   CREATININE 1.39* 0.95 0.98 0.82 0.70  CALCIUM 9.9 8.7* 9.3 8.6* 7.9*  MG  --   --   --   --  1.3*   GFR: Estimated Creatinine Clearance: 46.2 mL/min (by C-G formula based on SCr of 0.7 mg/dL). Liver Function Tests: Recent Labs  Lab 04/06/17 1516 04/08/17 1523  AST 17 18  ALT 12* 12*  ALKPHOS 224* 183*  BILITOT 0.8 0.8  PROT 9.3* 8.1  ALBUMIN 4.0 3.5   Recent Labs  Lab 04/06/17 1516 04/08/17 1523  LIPASE 84* 201*   Recent Labs  Lab 04/08/17 1523  AMMONIA <9*   Coagulation Profile: Recent Labs  Lab 04/08/17 1523  INR 0.93   Cardiac Enzymes: Recent Labs  Lab 04/06/17 1516 04/08/17 1523 04/09/17 0203 04/09/17 0736 04/09/17 1313  TROPONINI <0.03 <0.03 <0.03 <0.03 <0.03   BNP (last 3 results) No results for input(s): PROBNP in the last 8760 hours. HbA1C: No results for  input(s): HGBA1C in the last 72 hours. CBG: Recent Labs  Lab 04/09/17 1755 04/09/17 2023 04/10/17 0752 04/10/17 1239 04/10/17 1756  GLUCAP 336* 195* 156* 190* 203*   Lipid Profile: Recent Labs    04/09/17 0736  CHOL 177  HDL 50  LDLCALC 108*  TRIG 95  CHOLHDL 3.5   Thyroid Function Tests: Recent Labs    04/08/17 1523  TSH 3.024   Anemia Panel: No results for input(s): VITAMINB12, FOLATE, FERRITIN, TIBC, IRON, RETICCTPCT in the last 72 hours. Sepsis Labs: Recent Labs  Lab 04/08/17 2151 04/09/17 0203  PROCALCITON  --  <0.10  LATICACIDVEN 0.47*  --     Recent Results (from the past 240 hour(s))  Urine culture     Status: Abnormal   Collection Time: 04/08/17  4:37  PM  Result Value Ref Range Status   Specimen Description   Final    URINE, RANDOM Performed at [facility], [address] [place], [place] [address]    Special Requests   Final    NONE Performed at [facility], [address] [place], [place] [address]    Culture (Svea Pusch)  Final    <10,000 COLONIES/mL INSIGNIFICANT GROWTH Performed at [facility] [address] [place], [place] [address]    Report Status 04/09/2017 FINAL  Final  Blood culture (routine x 2)     Status: None (Preliminary result)   Collection Time: 04/08/17  8:50 PM  Result Value Ref Range Status   Specimen Description   Final    BLOOD BLOOD RIGHT FOREARM Performed at [facility], [address] [place], [place] [address]    Special Requests   Final    BOTTLES DRAWN AEROBIC AND ANAEROBIC Blood Culture adequate volume Performed at [facility], [address] [place], [place] [address]    Culture   Final    NO GROWTH 2 DAYS Performed at [facility], [place] [address], [place], [place] [address]    Report Status PENDING  Incomplete  Blood culture (routine x 2)     Status: None (Preliminary result)   Collection Time: 04/08/17  8:59 PM  Result Value Ref Range Status    Specimen Description   Final    BLOOD BLOOD LEFT FOREARM Performed at [facility], [address] [place], [place] [address]    Special Requests   Final    IN PEDIATRIC BOTTLE Blood Culture adequate volume Performed at [facility], [address] [place], [place] [address]    Culture   Final    NO GROWTH 2 DAYS Performed at [facility], [place] [address] [place], [place] [address]    Report Status PENDING  Incomplete  Culture, sputum-assessment     Status: None   Collection Time: 04/09/17 11:52 AM  Result Value Ref Range Status   Specimen Description SPUTUM  Final   Special Requests NONE  Final   Sputum evaluation   Final    THIS SPECIMEN IS ACCEPTABLE FOR SPUTUM CULTURE Performed at [facility], [place] [address], [place], [place] [address]    Report Status 04/09/2017 FINAL  Final  Culture, respiratory (NON-Expectorated)     Status: None (Preliminary result)   Collection Time: 04/09/17 11:52 AM  Result Value Ref Range Status   Specimen Description   Final    SPUTUM Performed at [facility] [address] [place], [place] [address]    Special Requests   Final    NONE Reflexed from [id number] Performed at [facility], [place] [address] [place], [place] [address]    Gram Stain   Final    FEW WBC PRESENT, PREDOMINANTLY PMN FEW GRAM POSITIVE COCCI IN PAIRS RARE GRAM NEGATIVE RODS RARE GRAM POSITIVE RODS    Culture   Final    CULTURE REINCUBATED FOR BETTER GROWTH Performed at [facility], [place] [address] [place], [place] [address]    Report Status PENDING  Incomplete         Radiology Studies: No results found.      Scheduled Meds: . enoxaparin (LOVENOX) injection  30 mg Subcutaneous Daily  . insulin aspart  0-5 Units Subcutaneous QHS  .  insulin aspart  0-9 Units Subcutaneous TID WC  . insulin aspart  3 Units Subcutaneous TID WC  . insulin glargine  8 Units Subcutaneous  Q2200  . nicotine  7 mg Transdermal Daily  . protein supplement shake  11 oz Oral Q24H   Continuous Infusions: . sodium chloride 125 mL/hr at 04/10/17 1200  . levofloxacin (LEVAQUIN) IV       LOS: 1 day    Time spent: over 30 min    Fayrene Helper, MD [facility] Pager [phone]  If 7PM-7AM, please contact night-coverage [email or web address] Password [facility] 04/10/2017, 6:33 PM

## 2017-04-10 NOTE — Discharge Summary (Signed)
Physician Discharge Summary  Brittany Mercado OBS:[id number] DOB: March 28, 1950 DOA: 04/08/2017  PCP: [name], DO  Admit date: 04/08/2017 Discharge date: 04/11/2017  Time spent: over 30 minutes  Recommendations for Outpatient Follow-up:  1. Follow up outpatient CBC/CMP 2. Follow up blood sugars with lantus/metformin 3. Pt with elevated lipase, but no clear sx of pancreatitis (doesn't sound like ever had abdominal pain, n/v).  With cystic lesion in pancreatic tail as well, consider follow up with GI as outpatient 4. Return to follow up with Dr. [name]  5. Follow cultures  6.  Discharge Diagnoses:  Principal Problem:   Hyperglycemia Active Problems:   Tobacco abuse   Adenocarcinoma of right lung, stage 4 (HCC)   Hypothyroidism (acquired)   Diabetes mellitus type 2 in nonobese [facility])   Acute encephalopathy   Community acquired pneumonia   Pancreatitis   Malnutrition of moderate degree   Discharge Condition: stable  Diet recommendation: heart healthy  Filed Weights   04/08/17 1447 04/09/17 0003  Weight: 40.8 kg (90 lb) 41.7 kg (91 lb 14.9 oz)    History of present illness:  Per PN In brief, pleasant 67 year old female with history of adenocarcinoma of the right lung post radiation and chemotherapy followed by Dr. Earlie Server, type 2 diabetes mellitus, hypertension who came in from her PCPs office due to elevated CBGs. She was diagnosed with diabetes mellitus couple weeks ago seems to have failed outpatient oral agents, was seen in the PCPs office yesterday was found to be lethargic as well as hyperglycemic and was directed to come to the hospital. She was also complaining of cough/chest congestion as well as Jaquelin Meaney productive cough  She was found to have CAP as well as elevated lipase concerning for pancreatitis.  She was started on insulin with improvement in BG's.  Her respiratory sx improved with levaquin.  She did not ever clearly have sx of pancreatitis, but lipase was  improving at discharge.  Hospital Course:  Type 2 diabetes mellitus with hyperglycemia -Discharge on Lantus 10 units plus metformin (fasting BG this morning ~200) -Most recent A1c was 7.2 - follow up with PCP  Community-acquired pneumonia -Symptomatic, with productive cough as well as right-sided opacities on chest x-ray concern for pneumonia - improved at discharge - Last dose of levaquin will be tomorrow  Acute Metabolic Encephalopathy: seems improved, [name]&ox3.  Possibly 2/2 CAP above.  Mild pancreatitis - Unclear etiology, and unclear significance of elevated lipase without abdominal pain (she notes the back pain she had was chronic). - CT abdomen/pelvis done 03/22/17 and was without gallstones noted and normal AST, ALT, bili to suggests against obstructive etiology  - follow lipid panel wnl and repeat lipase downtrending at discharge - no obvious medication cause - CT scan of the abdomen and pelvis on 1/25 with 6 mm cystic lesion in the pancreatic tail, will need to be follow-up with imaging as an outpatient. -She has no epigastric pain, no nausea or vomiting.  -Continue fluids, allow Cniyah Sproull diet  Abnormal UTM:LYYTK rhythm with LVH. Suspect pain to the back likely related with pancreatitis vs pneumonia (or chronic) -Continue to trend troponins (negative)  Stage IV lung adenocarcinoma -Outpatient management  Hyponatremia: improved  Hypothyroidism -Continue Synthroid  Tobacco abuse -Patient tells me she quit [name] month ago  Hypokalemia  Hypomagnesemia: improved at discharge  Procedures:  none   Consultations:  none  Discharge Exam: Vitals:   04/10/17 1951 04/11/17 0420  BP: 120/60 133/86  Pulse: 89 79  Resp: 19 18  Temp:  99 F (37.2 C) (!) 97.5 F (36.4 C)  SpO2: 100% 100%   Feeling better.  Ready to go.    General: No acute distress. Cardiovascular: Heart sounds show Ahonesty Woodfin regular rate, and rhythm. No gallops or rubs. No murmurs. No JVD. Lungs: Clear  to auscultation bilaterally with good air movement. No rales, rhonchi or wheezes.  Crackles on R base improved.  Abdomen: Soft, nontender, nondistended with normal active bowel sounds. No masses. No hepatosplenomegaly. Neurological: Alert and oriented 3. Moves all extremities 4  Cranial nerves II through XII grossly intact. Skin: Warm and dry. No rashes or lesions. Extremities: No clubbing or cyanosis. No edema.  Psychiatric: Mood and affect are normal. Insight and judgment are appropriate.   Discharge Instructions   Discharge Instructions    Ambulatory referral to Nutrition and Diabetic Education   Complete by:  As directed    Call MD for:  difficulty breathing, headache or visual disturbances   Complete by:  As directed    Call MD for:  persistant dizziness or light-headedness   Complete by:  As directed    Call MD for:  persistant nausea and vomiting   Complete by:  As directed    Call MD for:  severe uncontrolled pain   Complete by:  As directed    Call MD for:  temperature >100.4   Complete by:  As directed    Diet - low sodium heart healthy   Complete by:  As directed    Discharge instructions   Complete by:  As directed    You were seen for pneumonia, high blood sugars, and concern for pancreatitis.  Your symptoms seem to have improved on the day of discharge.  You have 1 more dose of antibiotics which will be due to take on 2/15.  We increased your lantus to 10 units daily.  I'll also start you on metformin, start this gradually (take 500 mg daily for 3 days, then 500 mg twice daily for 3 days, then increase to 1000 mg in the morning and 500 mg at night for 3 days, then increase to 1000 mg twice daily).  If you don't tolerate [name] certain dose because of side effects, take the previous tolerated dose.  You will need to follow up the lesion that was seen on your pancreas, your PCP may consider sending you to [name] gastroenterologist.  Return if you have new, recurrent, or worsening  symptoms.  Please ask your PCP to request records from this hospitalization so they know what was done and the next steps.   Increase activity slowly   Complete by:  As directed      Allergies as of 04/11/2017      Reactions   Penicillins Swelling   SWELLING REACTION UNSPECIFIED  Has patient had Jestin Burbach PCN reaction causing immediate rash, facial/tongue/throat swelling, SOB or lightheadedness with hypotension: Yes Has patient had Philana Younis PCN reaction causing severe rash involving mucus membranes or skin necrosis: No Has patient had Seriyah Collison PCN reaction that required hospitalization No Has patient had Hend Mccarrell PCN reaction occurring within the last 10 years: No If all of the above answers are "NO", then may proceed with Cephalosporin use.      Medication List    STOP taking these medications   hydrocortisone cream 0.5 %     TAKE these medications   aspirin 325 MG tablet Take 325 mg by mouth daily.   blood glucose meter kit and supplies Kit Dispense based on patient and insurance  preference. Use up to four times daily as directed. (FOR ICD-9 250.00, 250.01).   cetirizine 10 MG tablet Commonly known as:  ZYRTEC TAKE 1 TABLET(10 MG) BY MOUTH DAILY   dicyclomine 10 MG capsule Commonly known as:  BENTYL Take 1 capsule (10 mg total) by mouth 4 (four) times daily -  before meals and at bedtime.   ferrous sulfate 325 (65 FE) MG tablet Take 325 mg by mouth daily.   Insulin Glargine 100 UNIT/ML Solostar Pen Commonly known as:  LANTUS Inject 10 Units into the skin daily at 10 pm. What changed:  how much to take   levofloxacin 750 MG tablet Commonly known as:  LEVAQUIN Take 1 tablet (750 mg total) by mouth daily for 1 day. (take on 04/13/17) Start taking on:  04/12/2017   levothyroxine 100 MCG tablet Commonly known as:  SYNTHROID, LEVOTHROID TAKE 1 TABLET(100 MCG) BY MOUTH DAILY BEFORE BREAKFAST   meloxicam 7.5 MG tablet Commonly known as:  MOBIC TAKE 1 TABLET(7.5 MG) BY MOUTH DAILY   metFORMIN 500  MG tablet Commonly known as:  GLUCOPHAGE Take 2 tablets (1,000 mg total) by mouth 2 (two) times daily with Rasheena Talmadge meal. (take as written in dc summary)   multivitamin tablet Take 1 tablet by mouth daily.   traMADol 50 MG tablet Commonly known as:  ULTRAM Take 1 tablet (50 mg total) by mouth every 12 (twelve) hours as needed (Pain).      Allergies  Allergen Reactions  . Penicillins Swelling    SWELLING REACTION UNSPECIFIED  Has patient had Desmond Szabo PCN reaction causing immediate rash, facial/tongue/throat swelling, SOB or lightheadedness with hypotension: Yes Has patient had Elisabeth Strom PCN reaction causing severe rash involving mucus membranes or skin necrosis: No Has patient had Ronen Bromwell PCN reaction that required hospitalization No Has patient had Timira Bieda PCN reaction occurring within the last 10 years: No If all of the above answers are "NO", then may proceed with Cephalosporin use.      The results of significant diagnostics from this hospitalization (including imaging, microbiology, ancillary and laboratory) are listed below for reference.    Significant Diagnostic Studies: Dg Chest 2 View  Result Date: 04/08/2017 CLINICAL DATA:  67 year old female with Mikaylee Arseneau history of hyperglycemia, cough, altered mental status. Known lung cancer undergoing therapy EXAM: CHEST  2 VIEW COMPARISON:  03/22/2017 FINDINGS: Cardiomediastinal silhouette unchanged in size and contour. Airspace opacity in the right hilar and infrahilar region. No pneumothorax or pleural effusion. Stigmata of emphysema, with increased retrosternal airspace, flattened hemidiaphragms, increased AP diameter, and hyperinflation on the AP view. Coarsened interstitial markings. No displaced fracture. IMPRESSION: Airspace opacity in the right hilar and infrahilar region in this patient with known history of cancer undergoing therapy. Opacity may reflect treatment changes, or alternatively Katherene Dinino new pneumonia. Emphysema Electronically Signed   By: Corrie Mckusick D.O.   On:  04/08/2017 15:32   Ct Head Wo Contrast  Result Date: 04/08/2017 CLINICAL DATA:  Altered mental status, hyperglycemia, and cough. History of stage IV lung cancer. EXAM: CT HEAD WITHOUT CONTRAST TECHNIQUE: Contiguous axial images were obtained from the base of the skull through the vertex without intravenous contrast. COMPARISON:  Brain MRI 10/27/2015 FINDINGS: Brain: There is no evidence of acute infarct, intracranial hemorrhage, mass, midline shift, or extra-axial fluid collection. The ventricles and sulci are normal. There is at most subtle low-density in the cerebral white matter corresponding to T2 hyperintense foci on prior MRI. Vascular: Calcified atherosclerosis at the skull base. No hyperdense vessel. Skull: No  fracture or focal osseous lesion. Sinuses/Orbits: Visualized paranasal sinuses and mastoid air cells are clear. Visualized orbits are unremarkable. Other: None. IMPRESSION: No evidence of acute intracranial abnormality. Electronically Signed   By: Logan Bores M.D.   On: 04/08/2017 15:33   Ct Chest W Contrast  Result Date: 03/22/2017 CLINICAL DATA:  Lung cancer with ongoing chemo in recent completion of radiation therapy. EXAM: CT CHEST, ABDOMEN, AND PELVIS WITH CONTRAST TECHNIQUE: Multidetector CT imaging of the chest, abdomen and pelvis was performed following the standard protocol during bolus administration of intravenous contrast. CONTRAST:  2m ISOVUE-300 IOPAMIDOL (ISOVUE-300) INJECTION 61% COMPARISON:  09/25/2016 FINDINGS: CT CHEST FINDINGS Cardiovascular: The heart size is normal. No pericardial effusion. Coronary artery calcification is evident. Atherosclerotic calcification is noted in the wall of the thoracic aorta. Mediastinum/Nodes: Low attenuation inferior right hilar lymph node is similar to prior measuring 1.7 x 1.5 cm today compared to 1.9 x 1.5 cm previously. No left hilar lymphadenopathy. The esophagus has normal imaging features. There is no axillary lymphadenopathy.  Lungs/Pleura: Moderate centrilobular paraseptal emphysema noted as before. Posterior right lower lobe nodular density measures 1.0 x 1.9 cm today compared to 1.2 x 2.2 cm previously. While this lesion is not substantially changed, there is slight progression of soft tissue "fullness" along its cranial margin. No focal airspace consolidation. No pulmonary edema or pleural effusion. Musculoskeletal: Bone windows reveal no worrisome lytic or sclerotic osseous lesions. CT ABDOMEN PELVIS FINDINGS Hepatobiliary: No focal abnormality within the liver parenchyma. There is no evidence for gallstones, gallbladder wall thickening, or pericholecystic fluid. No intrahepatic or extrahepatic biliary dilation. Pancreas: 6 mm cyst in the tail of pancreas was not clearly visualized on the prior study. No dilatation of the main pancreatic duct. Spleen: No splenomegaly. No focal mass lesion. Adrenals/Urinary Tract: Left adrenal gland unremarkable. Interval development of Nikol Lemar 1.5 x 1.8 cm low-density lesion in the right adrenal gland (image 48 series 2). Kidneys are unremarkable. No evidence for hydroureter. The urinary bladder appears normal for the degree of distention. Stomach/Bowel: Stomach is nondistended. No gastric wall thickening. No evidence of outlet obstruction. Duodenum is normally positioned as is the ligament of Treitz. No small bowel wall thickening. No small bowel dilatation. The terminal ileum is normal. The appendix is not visualized, but there is no edema or inflammation in the region of the cecum. No gross colonic mass. No colonic wall thickening. No substantial diverticular change. Vascular/Lymphatic: There is abdominal aortic atherosclerosis without aneurysm. There is no gastrohepatic or hepatoduodenal ligament lymphadenopathy. No intraperitoneal or retroperitoneal lymphadenopathy. No pelvic sidewall lymphadenopathy. Reproductive: Uterus surgically absent.  There is no adnexal mass. Other: No intraperitoneal free  fluid. Musculoskeletal: Bone windows reveal no worrisome lytic or sclerotic osseous lesions. IMPRESSION: 1. Interval development of Chanse Kagel new necrotic 18 mm right adrenal nodule, highly concerning for metastatic involvement 2. Stable low-density/necrotic lymph node in the right inferior hilum. 3. Posterior right lower lobe subpleural nodule is similar to prior although there is some subtle increased fullness along the cranial margin of this finding. Close attention on follow-up recommended. 4. 6 mm cystic lesion in the pancreatic tail. Attention on follow-up imaging recommended. 5.  Aortic Atherosclerois (ICD10-170.0) 6.  Emphysema. (([id number]) Electronically Signed   By: EMisty StanleyM.D.   On: 03/22/2017 15:50   Ct Abdomen Pelvis W Contrast  Result Date: 03/22/2017 CLINICAL DATA:  Lung cancer with ongoing chemo in recent completion of radiation therapy. EXAM: CT CHEST, ABDOMEN, AND PELVIS WITH CONTRAST TECHNIQUE: Multidetector CT imaging of the  chest, abdomen and pelvis was performed following the standard protocol during bolus administration of intravenous contrast. CONTRAST:  33m ISOVUE-300 IOPAMIDOL (ISOVUE-300) INJECTION 61% COMPARISON:  09/25/2016 FINDINGS: CT CHEST FINDINGS Cardiovascular: The heart size is normal. No pericardial effusion. Coronary artery calcification is evident. Atherosclerotic calcification is noted in the wall of the thoracic aorta. Mediastinum/Nodes: Low attenuation inferior right hilar lymph node is similar to prior measuring 1.7 x 1.5 cm today compared to 1.9 x 1.5 cm previously. No left hilar lymphadenopathy. The esophagus has normal imaging features. There is no axillary lymphadenopathy. Lungs/Pleura: Moderate centrilobular paraseptal emphysema noted as before. Posterior right lower lobe nodular density measures 1.0 x 1.9 cm today compared to 1.2 x 2.2 cm previously. While this lesion is not substantially changed, there is slight progression of soft tissue "fullness" along its  cranial margin. No focal airspace consolidation. No pulmonary edema or pleural effusion. Musculoskeletal: Bone windows reveal no worrisome lytic or sclerotic osseous lesions. CT ABDOMEN PELVIS FINDINGS Hepatobiliary: No focal abnormality within the liver parenchyma. There is no evidence for gallstones, gallbladder wall thickening, or pericholecystic fluid. No intrahepatic or extrahepatic biliary dilation. Pancreas: 6 mm cyst in the tail of pancreas was not clearly visualized on the prior study. No dilatation of the main pancreatic duct. Spleen: No splenomegaly. No focal mass lesion. Adrenals/Urinary Tract: Left adrenal gland unremarkable. Interval development of Katlyn Muldrew 1.5 x 1.8 cm low-density lesion in the right adrenal gland (image 48 series 2). Kidneys are unremarkable. No evidence for hydroureter. The urinary bladder appears normal for the degree of distention. Stomach/Bowel: Stomach is nondistended. No gastric wall thickening. No evidence of outlet obstruction. Duodenum is normally positioned as is the ligament of Treitz. No small bowel wall thickening. No small bowel dilatation. The terminal ileum is normal. The appendix is not visualized, but there is no edema or inflammation in the region of the cecum. No gross colonic mass. No colonic wall thickening. No substantial diverticular change. Vascular/Lymphatic: There is abdominal aortic atherosclerosis without aneurysm. There is no gastrohepatic or hepatoduodenal ligament lymphadenopathy. No intraperitoneal or retroperitoneal lymphadenopathy. No pelvic sidewall lymphadenopathy. Reproductive: Uterus surgically absent.  There is no adnexal mass. Other: No intraperitoneal free fluid. Musculoskeletal: Bone windows reveal no worrisome lytic or sclerotic osseous lesions. IMPRESSION: 1. Interval development of Jadamarie Butson new necrotic 18 mm right adrenal nodule, highly concerning for metastatic involvement 2. Stable low-density/necrotic lymph node in the right inferior hilum. 3.  Posterior right lower lobe subpleural nodule is similar to prior although there is some subtle increased fullness along the cranial margin of this finding. Close attention on follow-up recommended. 4. 6 mm cystic lesion in the pancreatic tail. Attention on follow-up imaging recommended. 5.  Aortic Atherosclerois (ICD10-170.0) 6.  Emphysema. (([id number]) Electronically Signed   By: EMisty StanleyM.D.   On: 03/22/2017 15:50    Microbiology: Recent Results (from the past 240 hour(s))  Urine culture     Status: Abnormal   Collection Time: 04/08/17  4:37 PM  Result Value Ref Range Status   Specimen Description   Final    URINE, RANDOM [facility]formed at [facility] [address], [place] [id number]   Special Requests   Final    NONE Performed at [facility] [address], [id number]   Culture ([place])  Final    <10,000 COLONIES/mL INSIGNIFICANT GROWTH Performed at [facility] [address] [place] [address]   Report Status 04/09/2017  FINAL  Final  Blood culture (routine x 2)     Status: None (Preliminary result)   Collection Time: 04/08/17  8:50 PM  Result Value Ref Range Status   Specimen Description   Final    BLOOD BLOOD RIGHT FOREARM Performed at [facility], [address] [place], [place] [address]    Special Requests   Final    BOTTLES DRAWN AEROBIC AND ANAEROBIC Blood Culture adequate volume Performed at [facility], [address] [place], [place] [address]    Culture   Final    NO GROWTH 2 DAYS Performed at [facility], [place] [address], [place], [place] [address]    Report Status PENDING  Incomplete  Blood culture (routine x 2)     Status: None (Preliminary result)   Collection Time: 04/08/17  8:59 PM  Result Value Ref Range Status   Specimen Description   Final    BLOOD BLOOD LEFT FOREARM Performed at [facility] [address] [place], [place] [address]     Special Requests   Final    IN PEDIATRIC BOTTLE Blood Culture adequate volume Performed at [facility], [address] [place], [place] [address]    Culture   Final    NO GROWTH 2 DAYS Performed at [facility], [place] [address] [place], [place] [address]    Report Status PENDING  Incomplete  Culture, sputum-assessment     Status: None   Collection Time: 04/09/17 11:52 AM  Result Value Ref Range Status   Specimen Description SPUTUM  Final   Special Requests NONE  Final   Sputum evaluation   Final    THIS SPECIMEN IS ACCEPTABLE FOR SPUTUM CULTURE Performed at [facility], [place] [address], [place], [place] [address]    Report Status 04/09/2017 FINAL  Final  Culture, respiratory (NON-Expectorated)     Status: None (Preliminary result)   Collection Time: 04/09/17 11:52 AM  Result Value Ref Range Status   Specimen Description   Final    SPUTUM Performed at [place] [address], [place], [place] [address]    Special Requests   Final    NONE Reflexed from [id number] Performed at [facility], [place] [address], [place], [place] [address]    Gram Stain   Final    FEW WBC PRESENT, PREDOMINANTLY PMN FEW GRAM POSITIVE COCCI IN PAIRS RARE GRAM NEGATIVE RODS RARE GRAM POSITIVE RODS    Culture   Final    CULTURE REINCUBATED FOR BETTER GROWTH Performed at [facility], [place] [address] [place], [place] [address]    Report Status PENDING  Incomplete     Labs: Basic Metabolic Panel: Recent Labs  Lab 04/06/17 1902 04/08/17 1523 04/09/17 0736 04/10/17 0448 04/11/17 0552  NA 137 133* 138 137 136  K 3.6 4.1 3.2* 2.9* 4.2  CL 108 98* 107 110 108  CO2 20* 21* 20* 19* 20*  GLUCOSE 118* 352* 79 112* 213*  BUN 25* 15 12 6  <5*  CREATININE 0.95 0.98 0.82 0.70 0.72  CALCIUM 8.7* 9.3 8.6* 7.9* 7.8*  MG  --   --   --  1.3* 1.9   Liver Function Tests: Recent Labs  Lab 04/06/17 1516 04/08/17 1523  AST  17 18  ALT 12* 12*  ALKPHOS 224* 183*  BILITOT 0.8 0.8  PROT 9.3* 8.1  ALBUMIN 4.0 3.5  Recent Labs  Lab 04/06/17 1516 04/08/17 1523 04/11/17 0552  LIPASE 84* 201* 128*   Recent Labs  Lab 04/08/17 1523  AMMONIA <9*   CBC: Recent Labs  Lab 04/06/17 1516 04/08/17 1523 04/09/17 0736 04/10/17 0448 04/11/17 0552  WBC 9.6 6.7 6.2 6.3 6.2  NEUTROABS  --  4.6  --   --   --   HGB 14.0 12.5 11.4* 10.4* 10.0*  HCT 38.3 34.2* 31.2* 29.4* 27.0*  MCV 78.0 78.3 78.4 77.4* 77.8*  PLT 351 328 289 262 251   Cardiac Enzymes: Recent Labs  Lab 04/06/17 1516 04/08/17 1523 04/09/17 0203 04/09/17 0736 04/09/17 1313  TROPONINI <0.03 <0.03 <0.03 <0.03 <0.03   BNP: BNP (last 3 results) No results for input(s): BNP in the last 8760 hours.  ProBNP (last 3 results) No results for input(s): PROBNP in the last 8760 hours.  CBG: Recent Labs  Lab 04/10/17 1239 04/10/17 1756 04/10/17 2057 04/11/17 0735 04/11/17 1143  GLUCAP 190* 203* 99 201* 276*       Signed:  Fayrene Helper MD.  [facility] 04/11/2017, 12:04 PM

## 2017-04-10 NOTE — Progress Notes (Signed)
Inpatient Diabetes Program Recommendations  AACE/ADA: New Consensus Statement on Inpatient Glycemic Control (2015)  Target Ranges:  Prepandial:   less than 140 mg/dL      Peak postprandial:   less than 180 mg/dL (1-2 hours)      Critically ill patients:  140 - 180 mg/dL   Lab Results  Component Value Date   GLUCAP 190 (H) 04/10/2017   HGBA1C 7.2 (H) 03/25/2017    Review of Glycemic Control   HgbA1C - 7.2%  Educated patient on insulin pen use at home. Reviewed contents of insulin flexpen starter kit. Reviewed all steps if insulin pen including attachment of needle, 2-unit air shot, dialing up dose, giving injection, removing needle, disposal of sharps, storage of unused insulin, disposal of insulin etc. Patient able to provide successful return demonstration. Also reviewed troubleshooting with insulin pen. MD to give patient Rxs for insulin pens and insulin pen needles.  Discussed hypoglycemia s/s and treatment. Pt is to f/u with PCP for management of DM. Has meter at home, but needs a battery. Stressed importance of checking her blood sugars and writing results in logbook to take to MD appt.  Discussed above with RN.  Thank you. Lorenda Peck, RD, LDN, CDE Inpatient Diabetes Coordinator [phone]

## 2017-04-11 LAB — GLUCOSE, CAPILLARY
GLUCOSE-CAPILLARY: 201 mg/dL — AB (ref 65–99)
Glucose-Capillary: 276 mg/dL — ABNORMAL HIGH (ref 65–99)

## 2017-04-11 LAB — LIPID PANEL
Cholesterol: 145 mg/dL (ref 0–200)
HDL: 41 mg/dL (ref >40)
LDL CALC: 84 mg/dL (ref 0–99)
Total CHOL/HDL Ratio: 3.5 RATIO
Triglycerides: 98 mg/dL (ref <150)
VLDL: 20 mg/dL (ref 0–40)

## 2017-04-11 LAB — CBC
HEMATOCRIT: 27 % — AB (ref 36.0–46.0)
HEMOGLOBIN: 10 g/dL — AB (ref 12.0–15.0)
MCH: 28.8 pg (ref 26.0–34.0)
MCHC: 37 g/dL — ABNORMAL HIGH (ref 30.0–36.0)
MCV: 77.8 fL — ABNORMAL LOW (ref 78.0–100.0)
Platelets: 251 10*3/uL (ref 150–400)
RBC: 3.47 MIL/uL — ABNORMAL LOW (ref 3.87–5.11)
RDW: 13.6 % (ref 11.5–15.5)
WBC: 6.2 10*3/uL (ref 4.0–10.5)

## 2017-04-11 LAB — BASIC METABOLIC PANEL
ANION GAP: 8 (ref 5–15)
BUN: <5 mg/dL — ABNORMAL LOW (ref 6–20)
CO2: 20 mmol/L — AB (ref 22–32)
Calcium: 7.8 mg/dL — ABNORMAL LOW (ref 8.9–10.3)
Chloride: 108 mmol/L (ref 101–111)
Creatinine, Ser: 0.72 mg/dL (ref 0.44–1.00)
GFR calc non Af Amer: >60 mL/min (ref >60)
Glucose, Bld: 213 mg/dL — ABNORMAL HIGH (ref 65–99)
Potassium: 4.2 mmol/L (ref 3.5–5.1)
Sodium: 136 mmol/L (ref 135–145)

## 2017-04-11 LAB — LIPASE, BLOOD: LIPASE: 128 U/L — AB (ref 11–51)

## 2017-04-11 LAB — MAGNESIUM: Magnesium: 1.9 mg/dL (ref 1.7–2.4)

## 2017-04-11 MED ORDER — METFORMIN HCL 500 MG PO TABS: 1000.0000 mg | ORAL_TABLET | Freq: Two times a day (BID) | ORAL | 0 refills | Status: DC

## 2017-04-11 MED ORDER — LEVOFLOXACIN 750 MG PO TABS: 750.0000 mg | ORAL_TABLET | Freq: Every day | ORAL | 0 refills | Status: AC

## 2017-04-11 MED ORDER — INSULIN GLARGINE 100 UNIT/ML SOLOSTAR PEN: 10.0000 [IU] | PEN_INJECTOR | Freq: Every day | SUBCUTANEOUS | 1 refills | Status: DC

## 2017-04-11 MED ORDER — BLOOD GLUCOSE MONITOR KIT: PACK | 0 refills | Status: AC

## 2017-04-11 NOTE — Care Management Note (Signed)
Case Management Note  Patient Details  Name: Brittany Mercado MRN: [id number] Date of Birth: 20-Jul-1950  Subjective/Objective:                    Action/Plan:d/c home.   Expected Discharge Date:  04/11/17               Expected Discharge Plan:  Home/Self Care  In-House Referral:     Discharge planning Services  CM Consult  Post Acute Care Choice:    Choice offered to:     DME Arranged:    DME Agency:     HH Arranged:    HH Agency:     Status of Service:  Completed, signed off  If discussed at [employer] of Stay Meetings, dates discussed:    Additional Comments:  Dessa Phi, RN 04/11/2017, 12:40 PM

## 2017-04-12 ENCOUNTER — Telehealth: Payer: Self-pay | Admitting: Family Medicine

## 2017-04-12 ENCOUNTER — Telehealth: Payer: Self-pay | Admitting: *Deleted

## 2017-04-12 LAB — CULTURE, RESPIRATORY W GRAM STAIN

## 2017-04-12 LAB — CULTURE, RESPIRATORY: CULTURE: NORMAL

## 2017-04-12 MED ORDER — PEN NEEDLES 31G X 8 MM MISC: 1 refills | Status: DC

## 2017-04-12 NOTE — Telephone Encounter (Signed)
Copied from [place]. Topic: Quick Communication - Rx Refill/Question >> Apr 12, 2017 11:29 AM Margot Ables wrote: Medication: needing RX sent for pen needles for lantus pen, pharmacy told her they cannot provide without RX - pt does not have any  Has the patient contacted their pharmacy? Yes.   Preferred Pharmacy (with phone number or street name): [facility] [phone] - [place], [place] - [address] AT [place] [phone] (Phone) [phone] (Fax)

## 2017-04-12 NOTE — Telephone Encounter (Signed)
Sent in pen needles.

## 2017-04-12 NOTE — Telephone Encounter (Signed)
Brittany Mercado WEX:[id number] DOB: [birth year] DOA: 04/08/2017  PCP: [name], DO  Admit date: 04/08/2017 Discharge date: 04/11/2017  Time spent: over 30 minutes  Recommendations for Outpatient Follow-up:  1. Follow up outpatient CBC/CMP 2. Follow up blood sugars with lantus/metformin 3. Pt with elevated lipase, but no clear sx of pancreatitis (doesn't sound like ever had abdominal pain, n/v).  With cystic lesion in pancreatic tail as well, consider follow up with GI as outpatient 4. Return to follow up with Dr. [name]  5. Follow cultures  6.  Discharge Diagnoses:  Principal Problem:   Hyperglycemia Active Problems:   Tobacco abuse   Adenocarcinoma of right lung, stage 4 (HCC)   Hypothyroidism (acquired)   Diabetes mellitus type 2 in nonobese ([place])   Acute encephalopathy   Community acquired pneumonia   Pancreatitis   Malnutrition of moderate degree   Discharge Condition: stable  Diet recommendation: heart healthy  Transition Care Management Follow-up Telephone Call    How have you been since you were released from the hospital? "Doing okay. CBG =199 this morning"   Do you understand why you were in the hospital? yes   Do you understand the discharge instructions? yes   Where were you discharged to? Home    Items Reviewed:  Medications reviewed: yes  Allergies reviewed: yes  Dietary changes reviewed: yes  Referrals reviewed: yes   Functional Questionnaire:   Activities of Daily Living (ADLs):   She states they are independent in the following: ambulation, bathing and hygiene, feeding, continence, grooming, toileting and dressing States they require assistance with the following: na   Any transportation issues/concerns?: no   Any patient concerns? no   Confirmed importance and date/time of follow-up visits scheduled yes  Provider Appointment booked with Dr.Wendling 04/15/17 @230   Confirmed with patient if condition begins to  worsen call PCP or go to the ER.  Patient was given the office number and encouraged to call back with question or concerns.  : yes

## 2017-04-13 LAB — CULTURE, BLOOD (ROUTINE X 2)
CULTURE: NO GROWTH
CULTURE: NO GROWTH
Special Requests: ADEQUATE
Special Requests: ADEQUATE

## 2017-04-15 ENCOUNTER — Encounter: Payer: Self-pay | Admitting: Family Medicine

## 2017-04-15 ENCOUNTER — Inpatient Hospital Stay: Payer: Medicare Other

## 2017-04-15 ENCOUNTER — Ambulatory Visit (INDEPENDENT_AMBULATORY_CARE_PROVIDER_SITE_OTHER): Payer: Medicare Other | Admitting: Family Medicine

## 2017-04-15 ENCOUNTER — Encounter: Payer: Self-pay | Admitting: Internal Medicine

## 2017-04-15 ENCOUNTER — Inpatient Hospital Stay: Payer: Medicare Other | Attending: Oncology | Admitting: Internal Medicine

## 2017-04-15 VITALS — BP 108/60 | HR 90 | Temp 97.6°F | Ht 63.0 in | Wt 93.2 lb

## 2017-04-15 VITALS — BP 147/46 | HR 90 | Temp 97.6°F | Resp 16 | Ht 63.0 in | Wt 93.1 lb

## 2017-04-15 DIAGNOSIS — C7951 Secondary malignant neoplasm of bone: Secondary | ICD-10-CM | POA: Diagnosis not present

## 2017-04-15 DIAGNOSIS — C7801 Secondary malignant neoplasm of right lung: Secondary | ICD-10-CM | POA: Insufficient documentation

## 2017-04-15 DIAGNOSIS — R5382 Chronic fatigue, unspecified: Secondary | ICD-10-CM

## 2017-04-15 DIAGNOSIS — R0789 Other chest pain: Secondary | ICD-10-CM

## 2017-04-15 DIAGNOSIS — C3491 Malignant neoplasm of unspecified part of right bronchus or lung: Secondary | ICD-10-CM

## 2017-04-15 DIAGNOSIS — R1013 Epigastric pain: Secondary | ICD-10-CM | POA: Diagnosis not present

## 2017-04-15 DIAGNOSIS — E1165 Type 2 diabetes mellitus with hyperglycemia: Secondary | ICD-10-CM

## 2017-04-15 DIAGNOSIS — C3431 Malignant neoplasm of lower lobe, right bronchus or lung: Secondary | ICD-10-CM

## 2017-04-15 DIAGNOSIS — E039 Hypothyroidism, unspecified: Secondary | ICD-10-CM | POA: Insufficient documentation

## 2017-04-15 DIAGNOSIS — Z5112 Encounter for antineoplastic immunotherapy: Secondary | ICD-10-CM | POA: Insufficient documentation

## 2017-04-15 DIAGNOSIS — R739 Hyperglycemia, unspecified: Secondary | ICD-10-CM

## 2017-04-15 LAB — CBC WITH DIFFERENTIAL/PLATELET
BASOS PCT: 0 %
Basophils Absolute: 0 10*3/uL (ref 0.0–0.1)
EOS ABS: 0.1 10*3/uL (ref 0.0–0.5)
Eosinophils Relative: 1 %
HCT: 34.8 % (ref 34.8–46.6)
Hemoglobin: 11.8 g/dL (ref 11.6–15.9)
Lymphocytes Relative: 8 %
Lymphs Abs: 0.8 10*3/uL — ABNORMAL LOW (ref 0.9–3.3)
MCH: 28 pg (ref 25.1–34.0)
MCHC: 34 g/dL (ref 31.5–36.0)
MCV: 82.4 fL (ref 79.5–101.0)
MONO ABS: 0.8 10*3/uL (ref 0.1–0.9)
MONOS PCT: 8 %
Neutro Abs: 8.7 10*3/uL — ABNORMAL HIGH (ref 1.5–6.5)
Neutrophils Relative %: 83 %
Platelets: 342 10*3/uL (ref 145–400)
RBC: 4.22 MIL/uL (ref 3.70–5.45)
RDW: 14 % (ref 11.2–14.5)
WBC: 10.4 10*3/uL — ABNORMAL HIGH (ref 3.9–10.3)

## 2017-04-15 LAB — COMPREHENSIVE METABOLIC PANEL
ALBUMIN: 2.8 g/dL — AB (ref 3.5–5.0)
ALK PHOS: 166 U/L — AB (ref 40–150)
ALT: 6 U/L (ref 0–55)
AST: 16 U/L (ref 5–34)
Anion gap: 9 (ref 3–11)
BILIRUBIN TOTAL: 0.3 mg/dL (ref 0.2–1.2)
BUN: 7 mg/dL (ref 7–26)
CALCIUM: 9.2 mg/dL (ref 8.4–10.4)
CO2: 28 mmol/L (ref 22–29)
CREATININE: 0.99 mg/dL (ref 0.60–1.10)
Chloride: 97 mmol/L — ABNORMAL LOW (ref 98–109)
GFR calc non Af Amer: 59 mL/min — ABNORMAL LOW (ref >60)
GLUCOSE: 319 mg/dL — AB (ref 70–140)
Potassium: 3.6 mmol/L (ref 3.5–5.1)
SODIUM: 134 mmol/L — AB (ref 136–145)
TOTAL PROTEIN: 7 g/dL (ref 6.4–8.3)

## 2017-04-15 LAB — TSH: TSH: 12.664 u[IU]/mL — ABNORMAL HIGH (ref 0.308–3.960)

## 2017-04-15 MED ORDER — ALUM & MAG HYDROXIDE-SIMETH 200-200-20 MG/5ML PO SUSP
ORAL | Status: AC
  Filled 2017-04-15: qty 30

## 2017-04-15 MED ORDER — SODIUM CHLORIDE 0.9 % IV SOLN
Freq: Once | INTRAVENOUS | Status: AC
  Administered 2017-04-15: 12:00:00 via INTRAVENOUS

## 2017-04-15 MED ORDER — INSULIN ASPART 100 UNIT/ML ~~LOC~~ SOLN: 15.0000 [IU] | Freq: Once | SUBCUTANEOUS | Status: DC

## 2017-04-15 MED ORDER — HYDROCODONE-ACETAMINOPHEN 5-325 MG PO TABS: 1.0000 | ORAL_TABLET | Freq: Four times a day (QID) | ORAL | 0 refills | Status: DC | PRN

## 2017-04-15 MED ORDER — INSULIN REGULAR HUMAN 100 UNIT/ML IJ SOLN
15.0000 [IU] | Freq: Once | INTRAMUSCULAR | Status: AC
  Administered 2017-04-15: 15 [IU] via SUBCUTANEOUS
  Filled 2017-04-15: qty 0.15

## 2017-04-15 MED ORDER — ALUM & MAG HYDROXIDE-SIMETH 200-200-20 MG/5ML PO SUSP
30.0000 mL | Freq: Once | ORAL | Status: AC
  Administered 2017-04-15: 30 mL via ORAL

## 2017-04-15 MED ORDER — PEMBROLIZUMAB CHEMO INJECTION 100 MG/4ML
200.0000 mg | Freq: Once | INTRAVENOUS | Status: AC
  Administered 2017-04-15: 200 mg via INTRAVENOUS
  Filled 2017-04-15: qty 8

## 2017-04-15 MED ORDER — INSULIN LISPRO 100 UNIT/ML (KWIKPEN): PEN_INJECTOR | SUBCUTANEOUS | 11 refills | Status: DC

## 2017-04-15 NOTE — Patient Instructions (Signed)
[facility] Discharge Instructions for Patients Receiving Chemotherapy  Today you received the following chemotherapy agents :  Keytruda.  To help prevent nausea and vomiting after your treatment, we encourage you to take your nausea medication as prescribed.   If you develop nausea and vomiting that is not controlled by your nausea medication, call the clinic.   BELOW ARE SYMPTOMS THAT SHOULD BE REPORTED IMMEDIATELY:  *FEVER GREATER THAN 100.5 F  *CHILLS WITH OR WITHOUT FEVER  NAUSEA AND VOMITING THAT IS NOT CONTROLLED WITH YOUR NAUSEA MEDICATION  *UNUSUAL SHORTNESS OF BREATH  *UNUSUAL BRUISING OR BLEEDING  TENDERNESS IN MOUTH AND THROAT WITH OR WITHOUT PRESENCE OF ULCERS  *URINARY PROBLEMS  *BOWEL PROBLEMS  UNUSUAL RASH Items with * indicate a potential emergency and should be followed up as soon as possible.  Feel free to call the clinic should you have any questions or concerns. The clinic phone number is [phone].  Please show the CHEMO ALERT CARD at check-in to the Emergency Department and triage nurse.  

## 2017-04-15 NOTE — Progress Notes (Signed)
Chief Complaint  Patient presents with  . Hospitalization Follow-up    elevated BS    HPI Brittany Mercado is a 67 y.o. y.o. female who presents for a transition of care visit.  Pt was discharged from [facility] on 04/11/17.  Within 48 business hours of discharge our office contacted him via telephone to coordinate her care and needs. Here w son.  Admitted from 04/08/17-04/11/17 for hyperglycemia and CAP. Started on Levaquin and Lantus. Sugars improved, morning readings 51 and 35 most recently and she was having s/s's. She is not on mealtime insulin and notes that post prandial readings are in the 200-300's. Checks sugars 4x/d.   Still having R sided chest wall pain and neck pain. Mobic, Tylenol and Tramadol have not been helpful. She cannot tolerate oxycodone 2/2 AE's. She has been coughing 2/2 PNA and that makes it worse.   Social History   Socioeconomic History  . Marital status: Single  Occupational History  . Occupation: retired - childcare  Tobacco Use  . Smoking status: Former Smoker    Packs/day: 0.50    Years: 30.00    Pack years: 15.00    Last attempt to quit: 11/01/2015    Years since quitting: 1.4  . Smokeless tobacco: Never Used  Substance and Sexual Activity  . Alcohol use: No  . Drug use: No  . Sexual activity: Not Currently    Birth control/protection: Surgical   Past Medical History:  Diagnosis Date  . Adenocarcinoma of right lung, stage 4 ([place]) 11/30/2015  . Complication of anesthesia    hard to wake up  . Diabetes mellitus type 2 in nonobese (HCC)   . Eczema   . Encounter for antineoplastic chemotherapy 11/30/2015  . Heart murmur    told at age 26 but never has had any problems  . History of radiation therapy 01/26/16-02/07/16   right pelvis 30 Gy in 10 fractions  . HTN (hypertension) 02/01/2016  . 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 Iron deficiency anemia due to chronic blood loss 12/10/2016   Family History  Problem Relation Age of Onset  . Colon cancer Neg Hx    . Esophageal cancer Neg Hx   . Rectal cancer Neg Hx   . Stomach cancer Neg Hx    Allergies as of 04/15/2017      Reactions   Penicillins Swelling   SWELLING REACTION UNSPECIFIED  Has patient had a PCN reaction causing immediate rash, facial/tongue/throat swelling, SOB or lightheadedness with hypotension: Yes Has patient had a PCN reaction causing severe rash involving mucus membranes or skin necrosis: No Has patient had a PCN reaction that required hospitalization No Has patient had a PCN reaction occurring within the last 10 years: No If all of the above answers are "NO", then may proceed with Cephalosporin use.      Medication List        Accurate as of 04/15/17  3:12 PM. Always use your most recent med list.          aspirin 325 MG tablet Take 325 mg by mouth daily.   blood glucose meter kit and supplies Kit Dispense based on patient and insurance preference. Use up to four times daily as directed. (FOR ICD-9 250.00, 250.01).   cetirizine 10 MG tablet Commonly known as:  ZYRTEC TAKE 1 TABLET(10 MG) BY MOUTH DAILY   dicyclomine 10 MG capsule Commonly known as:  BENTYL Take 1 capsule (10 mg total) by mouth 4 (four) times daily -  before meals and  at bedtime.   ferrous sulfate 325 (65 FE) MG tablet Take 325 mg by mouth daily.   HYDROcodone-acetaminophen 5-325 MG tablet Commonly known as:  NORCO/VICODIN Take 1 tablet by mouth every 6 (six) hours as needed for moderate pain.   Insulin Glargine 100 UNIT/ML Solostar Pen Commonly known as:  LANTUS Inject 6 Units into the skin daily at 10 pm.   insulin lispro 100 UNIT/ML KiwkPen Commonly known as:  HUMALOG Inject 5 units before meals.   levothyroxine 100 MCG tablet Commonly known as:  SYNTHROID, LEVOTHROID TAKE 1 TABLET(100 MCG) BY MOUTH DAILY BEFORE BREAKFAST   meloxicam 7.5 MG tablet Commonly known as:  MOBIC TAKE 1 TABLET(7.5 MG) BY MOUTH DAILY   metFORMIN 500 MG tablet Commonly known as:  GLUCOPHAGE Take 2  tablets (1,000 mg total) by mouth 2 (two) times daily with a meal. (take as written in dc summary)   multivitamin tablet Take 1 tablet by mouth daily.   Pen Needles 31G X 8 MM Misc To use with insulin   traMADol 50 MG tablet Commonly known as:  ULTRAM Take 1 tablet (50 mg total) by mouth every 12 (twelve) hours as needed (Pain).       ROS:  Constitutional: No fevers or chills, no weight loss HEENT: No headaches, hearing loss, or runny nose, no sore throat Heart: + chest pain Lungs: No SOB, no cough Abd: No bowel changes, no pain, no N/V GU: No urinary complaints Neuro: No numbness, tingling or weakness Msk: No joint or muscle pain  Objective BP 108/60 (BP Location: Left Arm, Patient Position: Sitting, Cuff Size: Normal)   Pulse 90   Temp 97.6 F (36.4 C) (Oral)   Ht 5' 3"  (1.6 m)   Wt 93 lb 4 oz (42.3 kg)   SpO2 93%   BMI 16.52 kg/m  General Appearance:  awake, alert, oriented, in no acute distress and well developed, well nourished Skin:  there are no suspicious lesions or rashes of concern Head/face:  NCAT Eyes:  EOMI, PERRLA Ears:  canals and TMs NI Nose/Sinuses:  negative Mouth/Throat:  Mucosa moist, no lesions; pharynx without erythema, edema or exudate. Neck:  neck- supple, no mass, non-tender and no jvd Lungs: Clear to auscultation.  No rales, rhonchi, or wheezing. Normal effort, no accessory muscle use. Heart:  Heart sounds are normal.  Regular rate and rhythm without murmur, gallop or rub. No bruits. Abdomen:  BS+, soft, NT, ND, no masses or organomegaly Musculoskeletal: R chest wall TTP, no crepitus or deformity; No muscle group atrophy or asymmetry, gait normal Neurologic:  Alert and oriented x 3, gait normal., reflexes normal and symmetric, strength and  sensation grossly normal Psych exam: Nml mood and affect, age appropriate judgment and insight  Chest wall pain - Plan: Ambulatory referral to Home Health  Type 2 diabetes mellitus with hyperglycemia,  unspecified whether long term insulin use ([place]) - Plan: Insulin Glargine (LANTUS) 100 UNIT/ML Solostar Pen  Discharge summary and medication list have been reviewed/reconciled.  Labs pending at the time of discharge have been reviewed or are still pending at the time of this visit.  Follow-up labs and appointments have been ordered and/or coordinated appropriately. Educational materials regarding the patient's admitting diagnosis provided.  TRANSITIONAL CARE MANAGEMENT CERTIFICATION:  I certify the following are true:   1. Communication with the patient/care giver was made within 2 business days of discharge.  2. Complexity of Medical decision making is high.  3. Face to face visit occurred within 7  days of discharge.   She does look much better than when I saw her last.  Add mealtime insulin 5 u per meal. Low dose. Scale back basal insulin.  Add opiate for breakthrough pain. Radiation/cancer related pain? She relays oncologist was not concerned. Ice, home health/PT for this. F/u in 2 weeks to review sugars. Will ck labs at that visit.  The patient and her son voiced understanding and agreement to the plan.  [place], DO 04/15/17 3:12 PM

## 2017-04-15 NOTE — Patient Instructions (Addendum)
If you do not hear anything about your referral in the next week, call our office and ask for an update.  Go down to 6 units of Lantus nightly.  Heat (pad or rice pillow in microwave) over affected area, 10-15 minutes twice daily.   Let us know if you need anything.

## 2017-04-15 NOTE — Progress Notes (Signed)
[place] [phone]   [phone]  OFFICE PROGRESS NOTE  [name], DO [address] [address]  DIAGNOSIS: Stage IV (T1b, N2, M1 B) non-small cell lung cancer, adenocarcinoma with positive PDL 1 expression (99%), presented with right lower lobe lung nodule with metastatic right hilar and subcarinal lymphadenopathy as well as bone metastasis diagnosed in September 2017.  Genomic Alterations Identified? NF1 G335f*9 ARID1B [id number] PBRM1 [id number] TP53 [id number] Additional Findings? Microsatellite status MS-Stable Tumor Mutation Burden TMB-Intermediate; 7 Muts/Mb Additional Disease-relevant Genes with No Reportable Alterations Identified? EGFR KRAS ALK BRAF MET ERBB2 RET ROS1  PRIOR THERAPY: Palliative radiotherapy to the metastatic bone lesion under the care of Dr. KinaSondra ComeURRENT THERAPY: First-line treatment with immunotherapy with Ketruda (pembrolizumab) 200 mg IV every 3 weeks status post 22 cycles. First cycle was given 12/21/2015.  INTERVAL HISTORY: Brittany Mahuriny34. female returns to the clinic today for follow-up visit.  The patient is feeling fine today with no specific complaints except for upset stomach.  [facility].  She is feeling better today.  Her blood sugar still elevated and she has an appointment with her primary care physician later today for evaluation and discussion of treatment for her diabetes.  She denied having any nausea or vomiting.  She lost a few pounds since her last visit.  She denied having any fever or chills.  She is here today for evaluation before starting cycle #23 of her treatment.   MEDICAL HISTORY: Past Medical History:  Diagnosis Date  . Adenocarcinoma of right lung, stage 4 (HCC)[place]/05/2015  . Complication of anesthesia    hard to wake up  . Diabetes mellitus type 2 in nonobese (HCC)   .  Eczema   . Encounter for antineoplastic chemotherapy 11/30/2015  . Heart murmur    told at age 65 b74 never has had any problems  . History of radiation therapy 01/26/16-02/07/16   right pelvis 30 Gy in 10 fractions  . HTN (hypertension) 02/01/2016  . 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 Iron deficiency anemia due to chronic blood loss 12/10/2016    ALLERGIES:  is allergic to penicillins.  MEDICATIONS:  Current Outpatient Medications  Medication Sig Dispense Refill  . aspirin 325 MG tablet Take 325 mg by mouth daily.    . blood glucose meter kit and supplies KIT Dispense based on patient and insurance preference. Use up to four times daily as directed. (FOR ICD-9 250.00, 250.01). 1 each 0  . cetirizine (ZYRTEC) 10 MG tablet TAKE 1 TABLET(10 MG) BY MOUTH DAILY 30 tablet 0  . dicyclomine (BENTYL) 10 MG capsule Take 1 capsule (10 mg total) by mouth 4 (four) times daily -  before meals and at bedtime. 30 capsule 0  . ferrous sulfate 325 (65 FE) MG tablet Take 325 mg by mouth daily.    . Insulin Glargine (LANTUS) 100 UNIT/ML Solostar Pen Inject 10 Units into the skin daily at 10 pm. 15 mL 1  . Insulin Pen Needle (PEN NEEDLES) 31G X 8 MM MISC To use with insulin 50 each 1  . levothyroxine (SYNTHROID, LEVOTHROID) 100 MCG tablet TAKE 1 TABLET(100 MCG) BY MOUTH DAILY BEFORE BREAKFAST 30 tablet 0  . meloxicam (MOBIC) 7.5 MG tablet TAKE 1 TABLET(7.5 MG) BY MOUTH DAILY 90 tablet 1  . metFORMIN (GLUCOPHAGE) 500 MG tablet Take 2 tablets (1,000 mg total) by mouth 2 (  two) times daily with a meal. (take as written in dc summary) 120 tablet 0  . Multiple Vitamin (MULTIVITAMIN) tablet Take 1 tablet by mouth daily.    . traMADol (ULTRAM) 50 MG tablet Take 1 tablet (50 mg total) by mouth every 12 (twelve) hours as needed (Pain). 30 tablet 0   No current facility-administered medications for this visit.     SURGICAL HISTORY:  Past Surgical History:  Procedure Laterality Date  . ABDOMINAL HYSTERECTOMY     . OOPHORECTOMY  1996   tumor removed from right ovary  . TONSILLECTOMY    . VIDEO BRONCHOSCOPY WITH ENDOBRONCHIAL NAVIGATION N/A 11/18/2015   Procedure: VIDEO BRONCHOSCOPY WITH ENDOBRONCHIAL NAVIGATION;  Surgeon: Melrose Nakayama, MD;  Location: [place];  Service: Thoracic;  Laterality: N/A;  . VIDEO BRONCHOSCOPY WITH ENDOBRONCHIAL ULTRASOUND N/A 11/18/2015   Procedure: VIDEO BRONCHOSCOPY WITH ENDOBRONCHIAL ULTRASOUND;  Surgeon: Melrose Nakayama, MD;  Location: [facility] OR;  Service: Thoracic;  Laterality: N/A;    REVIEW OF SYSTEMS:  A comprehensive review of systems was negative except for: Constitutional: positive for fatigue Gastrointestinal: positive for dyspepsia and reflux symptoms   PHYSICAL EXAMINATION: General appearance: alert, cooperative, fatigued and no distress Head: Normocephalic, without obvious abnormality, atraumatic Neck: no adenopathy, no JVD, supple, symmetrical, trachea midline and thyroid not enlarged, symmetric, no tenderness/mass/nodules Lymph nodes: Cervical, supraclavicular, and axillary nodes normal. Resp: clear to auscultation bilaterally Back: symmetric, no curvature. ROM normal. No CVA tenderness. Cardio: normal apical impulse GI: soft, non-tender; bowel sounds normal; no masses,  no organomegaly Extremities: extremities normal, atraumatic, no cyanosis or edema  ECOG PERFORMANCE STATUS: 1 - Symptomatic but completely ambulatory  Blood pressure (!) 147/46, pulse 90, temperature 97.6 F (36.4 C), temperature source Oral, resp. rate 16, height _0  (1.6 m), weight 93 lb 1.6 oz (42.2 kg).  LABORATORY DATA: Lab Results  Component Value Date   WBC 10.4 (H) 04/15/2017   HGB 11.8 04/15/2017   HCT 34.8 04/15/2017   MCV 82.4 04/15/2017   PLT 342 04/15/2017      Chemistry      Component Value Date/Time   NA 134 (L) 04/15/2017 1008   NA 140 02/08/2017 1241   K 3.6 04/15/2017 1008   K 4.3 02/08/2017 1241   CL 97 (L) 04/15/2017 1008   CO2 28 04/15/2017  1008   CO2 24 02/08/2017 1241   BUN 7 04/15/2017 1008   BUN 13.3 02/08/2017 1241   CREATININE 0.99 04/15/2017 1008   CREATININE 1.0 02/08/2017 1241      Component Value Date/Time   CALCIUM 9.2 04/15/2017 1008   CALCIUM 9.5 02/08/2017 1241   ALKPHOS 166 (H) 04/15/2017 1008   ALKPHOS 395 (H) 02/08/2017 1241   AST 16 04/15/2017 1008   AST 61 (H) 02/08/2017 1241   ALT 6 04/15/2017 1008   ALT 53 02/08/2017 1241   BILITOT 0.3 04/15/2017 1008   BILITOT 0.26 02/08/2017 1241       RADIOGRAPHIC STUDIES: Dg Chest 2 View  Result Date: 04/08/2017 CLINICAL DATA:  67 year old female with a history of hyperglycemia, cough, altered mental status. Known lung cancer undergoing therapy EXAM: CHEST  2 VIEW COMPARISON:  03/22/2017 FINDINGS: Cardiomediastinal silhouette unchanged in size and contour. Airspace opacity in the right hilar and infrahilar region. No pneumothorax or pleural effusion. Stigmata of emphysema, with increased retrosternal airspace, flattened hemidiaphragms, increased AP diameter, and hyperinflation on the AP view. Coarsened interstitial markings. No displaced fracture. IMPRESSION: Airspace opacity in the right hilar and infrahilar  region in this patient with known history of cancer undergoing therapy. Opacity may reflect treatment changes, or alternatively a new pneumonia. Emphysema Electronically Signed   By: Corrie Mckusick D.O.   On: 04/08/2017 15:32   Ct Head Wo Contrast  Result Date: 04/08/2017 CLINICAL DATA:  Altered mental status, hyperglycemia, and cough. History of stage IV lung cancer. EXAM: CT HEAD WITHOUT CONTRAST TECHNIQUE: Contiguous axial images were obtained from the base of the skull through the vertex without intravenous contrast. COMPARISON:  Brain MRI 10/27/2015 FINDINGS: Brain: There is no evidence of acute infarct, intracranial hemorrhage, mass, midline shift, or extra-axial fluid collection. The ventricles and sulci are normal. There is at most subtle low-density in  the cerebral white matter corresponding to T2 hyperintense foci on prior MRI. Vascular: Calcified atherosclerosis at the skull base. No hyperdense vessel. Skull: No fracture or focal osseous lesion. Sinuses/Orbits: Visualized paranasal sinuses and mastoid air cells are clear. Visualized orbits are unremarkable. Other: None. IMPRESSION: No evidence of acute intracranial abnormality. Electronically Signed   By: Logan Bores M.D.   On: 04/08/2017 15:33   Ct Chest W Contrast  Result Date: 03/22/2017 CLINICAL DATA:  Lung cancer with ongoing chemo in recent completion of radiation therapy. EXAM: CT CHEST, ABDOMEN, AND PELVIS WITH CONTRAST TECHNIQUE: Multidetector CT imaging of the chest, abdomen and pelvis was performed following the standard protocol during bolus administration of intravenous contrast. CONTRAST:  47m ISOVUE-300 IOPAMIDOL (ISOVUE-300) INJECTION 61% COMPARISON:  09/25/2016 FINDINGS: CT CHEST FINDINGS Cardiovascular: The heart size is normal. No pericardial effusion. Coronary artery calcification is evident. Atherosclerotic calcification is noted in the wall of the thoracic aorta. Mediastinum/Nodes: Low attenuation inferior right hilar lymph node is similar to prior measuring 1.7 x 1.5 cm today compared to 1.9 x 1.5 cm previously. No left hilar lymphadenopathy. The esophagus has normal imaging features. There is no axillary lymphadenopathy. Lungs/Pleura: Moderate centrilobular paraseptal emphysema noted as before. Posterior right lower lobe nodular density measures 1.0 x 1.9 cm today compared to 1.2 x 2.2 cm previously. While this lesion is not substantially changed, there is slight progression of soft tissue "fullness" along its cranial margin. No focal airspace consolidation. No pulmonary edema or pleural effusion. Musculoskeletal: Bone windows reveal no worrisome lytic or sclerotic osseous lesions. CT ABDOMEN PELVIS FINDINGS Hepatobiliary: No focal abnormality within the liver parenchyma. There is  no evidence for gallstones, gallbladder wall thickening, or pericholecystic fluid. No intrahepatic or extrahepatic biliary dilation. Pancreas: 6 mm cyst in the tail of pancreas was not clearly visualized on the prior study. No dilatation of the main pancreatic duct. Spleen: No splenomegaly. No focal mass lesion. Adrenals/Urinary Tract: Left adrenal gland unremarkable. Interval development of a 1.5 x 1.8 cm low-density lesion in the right adrenal gland (image 48 series 2). Kidneys are unremarkable. No evidence for hydroureter. The urinary bladder appears normal for the degree of distention. Stomach/Bowel: Stomach is nondistended. No gastric wall thickening. No evidence of outlet obstruction. Duodenum is normally positioned as is the ligament of Treitz. No small bowel wall thickening. No small bowel dilatation. The terminal ileum is normal. The appendix is not visualized, but there is no edema or inflammation in the region of the cecum. No gross colonic mass. No colonic wall thickening. No substantial diverticular change. Vascular/Lymphatic: There is abdominal aortic atherosclerosis without aneurysm. There is no gastrohepatic or hepatoduodenal ligament lymphadenopathy. No intraperitoneal or retroperitoneal lymphadenopathy. No pelvic sidewall lymphadenopathy. Reproductive: Uterus surgically absent.  There is no adnexal mass. Other: No intraperitoneal free fluid. Musculoskeletal:  Bone windows reveal no worrisome lytic or sclerotic osseous lesions. IMPRESSION: 1. Interval development of a new necrotic 18 mm right adrenal nodule, highly concerning for metastatic involvement 2. Stable low-density/necrotic lymph node in the right inferior hilum. 3. Posterior right lower lobe subpleural nodule is similar to prior although there is some subtle increased fullness along the cranial margin of this finding. Close attention on follow-up recommended. 4. 6 mm cystic lesion in the pancreatic tail. Attention on follow-up imaging  recommended. 5.  Aortic Atherosclerois (ICD10-170.0) 6.  Emphysema. (GXQ11-H41.9) Electronically Signed   By: Misty Stanley M.D.   On: 03/22/2017 15:50   Ct Abdomen Pelvis W Contrast  Result Date: 03/22/2017 CLINICAL DATA:  Lung cancer with ongoing chemo in recent completion of radiation therapy. EXAM: CT CHEST, ABDOMEN, AND PELVIS WITH CONTRAST TECHNIQUE: Multidetector CT imaging of the chest, abdomen and pelvis was performed following the standard protocol during bolus administration of intravenous contrast. CONTRAST:  33m ISOVUE-300 IOPAMIDOL (ISOVUE-300) INJECTION 61% COMPARISON:  09/25/2016 FINDINGS: CT CHEST FINDINGS Cardiovascular: The heart size is normal. No pericardial effusion. Coronary artery calcification is evident. Atherosclerotic calcification is noted in the wall of the thoracic aorta. Mediastinum/Nodes: Low attenuation inferior right hilar lymph node is similar to prior measuring 1.7 x 1.5 cm today compared to 1.9 x 1.5 cm previously. No left hilar lymphadenopathy. The esophagus has normal imaging features. There is no axillary lymphadenopathy. Lungs/Pleura: Moderate centrilobular paraseptal emphysema noted as before. Posterior right lower lobe nodular density measures 1.0 x 1.9 cm today compared to 1.2 x 2.2 cm previously. While this lesion is not substantially changed, there is slight progression of soft tissue "fullness" along its cranial margin. No focal airspace consolidation. No pulmonary edema or pleural effusion. Musculoskeletal: Bone windows reveal no worrisome lytic or sclerotic osseous lesions. CT ABDOMEN PELVIS FINDINGS Hepatobiliary: No focal abnormality within the liver parenchyma. There is no evidence for gallstones, gallbladder wall thickening, or pericholecystic fluid. No intrahepatic or extrahepatic biliary dilation. Pancreas: 6 mm cyst in the tail of pancreas was not clearly visualized on the prior study. No dilatation of the main pancreatic duct. Spleen: No splenomegaly. No  focal mass lesion. Adrenals/Urinary Tract: Left adrenal gland unremarkable. Interval development of a 1.5 x 1.8 cm low-density lesion in the right adrenal gland (image 48 series 2). Kidneys are unremarkable. No evidence for hydroureter. The urinary bladder appears normal for the degree of distention. Stomach/Bowel: Stomach is nondistended. No gastric wall thickening. No evidence of outlet obstruction. Duodenum is normally positioned as is the ligament of Treitz. No small bowel wall thickening. No small bowel dilatation. The terminal ileum is normal. The appendix is not visualized, but there is no edema or inflammation in the region of the cecum. No gross colonic mass. No colonic wall thickening. No substantial diverticular change. Vascular/Lymphatic: There is abdominal aortic atherosclerosis without aneurysm. There is no gastrohepatic or hepatoduodenal ligament lymphadenopathy. No intraperitoneal or retroperitoneal lymphadenopathy. No pelvic sidewall lymphadenopathy. Reproductive: Uterus surgically absent.  There is no adnexal mass. Other: No intraperitoneal free fluid. Musculoskeletal: Bone windows reveal no worrisome lytic or sclerotic osseous lesions. IMPRESSION: 1. Interval development of a new necrotic 18 mm right adrenal nodule, highly concerning for metastatic involvement 2. Stable low-density/necrotic lymph node in the right inferior hilum. 3. Posterior right lower lobe subpleural nodule is similar to prior although there is some subtle increased fullness along the cranial margin of this finding. Close attention on follow-up recommended. 4. 6 mm cystic lesion in the pancreatic tail. Attention on follow-up  imaging recommended. 5.  Aortic Atherosclerois (ICD10-170.0) 6.  Emphysema. ([id number]) Electronically Signed   By: Misty Stanley M.D.   On: 03/22/2017 15:50    ASSESSMENT AND PLAN:  This is a very pleasant 67 years old African-American female with metastatic non-small cell lung cancer, adenocarcinoma  with positive PDL 1 expression of 99%. She is currently undergoing treatment with Ketruda 200 mg IV every 3 weeks, status post 22 cycles. The patient continues to tolerate this treatment fairly well with no concerning complaints. I recommended for her to proceed with cycle #23 today as a scheduled. For the history of diabetes mellitus and recent hyperglycemia, she is scheduled to see her primary care physician later today for evaluation and management of her medications. For the dyspepsia, I gave the patient Maalox in the clinic and she has prescription for Pepcid but she has not refilled the medication yet. She will come back for follow-up visit in 3 weeks for evaluation before the next cycle of her treatment. The patient was advised to call immediately if she has any concerning symptoms in the interval. The patient voices understanding of current disease status and treatment options and is in agreement with the current care plan. All questions were answered. The patient knows to call the clinic with any problems, questions or concerns. We can certainly see the patient much sooner if necessary.  Disclaimer: This note was dictated with voice recognition software. Similar sounding words can inadvertently be transcribed and may not be corrected upon review.

## 2017-04-15 NOTE — Progress Notes (Signed)
Pre visit review using our clinic review tool, if applicable. No additional management support is needed unless otherwise documented below in the visit note. 

## 2017-04-16 ENCOUNTER — Telehealth: Payer: Self-pay | Admitting: Internal Medicine

## 2017-04-16 NOTE — Telephone Encounter (Signed)
Scheduled appt per 2/18 los - Patient to get an updated schedule next visit.

## 2017-04-23 DIAGNOSIS — D500 Iron deficiency anemia secondary to blood loss (chronic): Secondary | ICD-10-CM | POA: Diagnosis not present

## 2017-04-23 DIAGNOSIS — Z794 Long term (current) use of insulin: Secondary | ICD-10-CM | POA: Diagnosis not present

## 2017-04-23 DIAGNOSIS — Z79891 Long term (current) use of opiate analgesic: Secondary | ICD-10-CM | POA: Diagnosis not present

## 2017-04-23 DIAGNOSIS — E119 Type 2 diabetes mellitus without complications: Secondary | ICD-10-CM | POA: Diagnosis not present

## 2017-04-23 DIAGNOSIS — E039 Hypothyroidism, unspecified: Secondary | ICD-10-CM | POA: Diagnosis not present

## 2017-04-23 DIAGNOSIS — Z87891 Personal history of nicotine dependence: Secondary | ICD-10-CM | POA: Diagnosis not present

## 2017-04-23 DIAGNOSIS — Z9181 History of falling: Secondary | ICD-10-CM | POA: Diagnosis not present

## 2017-04-23 DIAGNOSIS — C3491 Malignant neoplasm of unspecified part of right bronchus or lung: Secondary | ICD-10-CM | POA: Diagnosis not present

## 2017-04-23 DIAGNOSIS — Z7984 Long term (current) use of oral hypoglycemic drugs: Secondary | ICD-10-CM | POA: Diagnosis not present

## 2017-04-23 DIAGNOSIS — Z87440 Personal history of urinary (tract) infections: Secondary | ICD-10-CM | POA: Diagnosis not present

## 2017-04-23 DIAGNOSIS — Z7982 Long term (current) use of aspirin: Secondary | ICD-10-CM | POA: Diagnosis not present

## 2017-04-23 DIAGNOSIS — I10 Essential (primary) hypertension: Secondary | ICD-10-CM | POA: Diagnosis not present

## 2017-04-24 ENCOUNTER — Telehealth: Payer: Self-pay | Admitting: Family Medicine

## 2017-04-24 NOTE — Telephone Encounter (Signed)
Copied from [place]. Topic: Quick Communication - See Telephone Encounter >> Apr 24, 2017  3:11 PM Antonieta Iba C wrote: CRM for notification. See Telephone encounter for: Manuela Schwartz Nurse with [facility]- [phone]   Called in for verbal orders to work with pt.   Frequency: twice a week for 2 week and then 1 time a week for 4 weeks and 3 as needed     04/24/17.

## 2017-04-24 NOTE — Telephone Encounter (Signed)
Called [facility] left detailed message of verbal ok

## 2017-04-29 ENCOUNTER — Encounter: Payer: Self-pay | Admitting: Family Medicine

## 2017-04-29 ENCOUNTER — Ambulatory Visit (INDEPENDENT_AMBULATORY_CARE_PROVIDER_SITE_OTHER): Payer: Medicare Other | Admitting: Family Medicine

## 2017-04-29 VITALS — BP 108/68 | HR 88 | Temp 97.7°F | Ht 63.0 in | Wt 90.5 lb

## 2017-04-29 DIAGNOSIS — R0789 Other chest pain: Secondary | ICD-10-CM | POA: Diagnosis not present

## 2017-04-29 DIAGNOSIS — E119 Type 2 diabetes mellitus without complications: Secondary | ICD-10-CM

## 2017-04-29 DIAGNOSIS — R627 Adult failure to thrive: Secondary | ICD-10-CM | POA: Diagnosis not present

## 2017-04-29 MED ORDER — MIRTAZAPINE 30 MG PO TABS: 30.0000 mg | ORAL_TABLET | Freq: Every day | ORAL | 5 refills | Status: DC

## 2017-04-29 MED ORDER — DICLOFENAC SODIUM 2 % TD SOLN: TRANSDERMAL | 1 refills | Status: DC

## 2017-04-29 NOTE — Progress Notes (Signed)
Chief Complaint  Patient presents with  . Follow-up    Subjective: Patient is a 68 y.o. female here for insulin f/u. Here w son.   Pt recently dx'd w DM II. She was hospitalized recent for PNA and was started on Insulin. She saw me for f/u and was on too much, she was sent home on 6 u Lantus nightly. She was ordered to use 5 u Humalog with meals, but her appetite is poor so she has not consistently been doing this. AM sugars range from 90's-mid 200's. Daytime readings range from 200's. Denies hypoglycemia. Compliant with Metformin also.   Pt has stage IV lung cancer and follows with oncology team. She has had poor appetite and weight gain despite trying to increase proteinaceous intake and ingesting protein supplements.   She has continued chest wall pain. NSAIDs have been the most helpful, but not fully. She has been using ice. Tramadol and hydrocodone have not been helpful. No inj or change in activity. The oncology did not seem concerned regarding this ([facility]?).   ROS: MSK: +chest wall pain Lungs: Denies SOB   Family History  Problem Relation Age of Onset  . Colon cancer Neg Hx   . Esophageal cancer Neg Hx   . Rectal cancer Neg Hx   . Stomach cancer Neg Hx    Past Medical History:  Diagnosis Date  . Adenocarcinoma of right lung, stage 4 ([place]) 11/30/2015  . Complication of anesthesia    hard to wake up  . Diabetes mellitus type 2 in nonobese (HCC)   . Eczema   . Encounter for antineoplastic chemotherapy 11/30/2015  . Heart murmur    told at age 4 but never has had any problems  . History of radiation therapy 01/26/16-02/07/16   right pelvis 30 Gy in 10 fractions  . HTN (hypertension) 02/01/2016  . 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 Iron deficiency anemia due to chronic blood loss 12/10/2016   Allergies  Allergen Reactions  . Penicillins Swelling    SWELLING REACTION UNSPECIFIED  Has patient had a PCN reaction causing immediate rash, facial/tongue/throat swelling, SOB  or lightheadedness with hypotension: Yes Has patient had a PCN reaction causing severe rash involving mucus membranes or skin necrosis: No Has patient had a PCN reaction that required hospitalization No Has patient had a PCN reaction occurring within the last 10 years: No If all of the above answers are "NO", then may proceed with Cephalosporin use.    Current Outpatient Medications:  .  aspirin 325 MG tablet, Take 325 mg by mouth daily., Disp: , Rfl:  .  blood glucose meter kit and supplies KIT, Dispense based on patient and insurance preference. Use up to four times daily as directed. (FOR ICD-9 250.00, 250.01)., Disp: 1 each, Rfl: 0 .  cetirizine (ZYRTEC) 10 MG tablet, TAKE 1 TABLET(10 MG) BY MOUTH DAILY, Disp: 30 tablet, Rfl: 0 .  dicyclomine (BENTYL) 10 MG capsule, Take 1 capsule (10 mg total) by mouth 4 (four) times daily -  before meals and at bedtime., Disp: 30 capsule, Rfl: 0 .  ferrous sulfate 325 (65 FE) MG tablet, Take 325 mg by mouth daily., Disp: , Rfl:  .  Insulin Glargine (LANTUS) 100 UNIT/ML Solostar Pen, Inject 6 Units into the skin daily at 10 pm., Disp: 15 mL, Rfl: 1 .  insulin lispro (HUMALOG) 100 UNIT/ML KiwkPen, Inject 1 units before meals., Disp: 15 mL, Rfl: 11 .  Insulin Pen Needle (PEN NEEDLES) 31G X 8 MM MISC, To  use with insulin, Disp: 50 each, Rfl: 1 .  levothyroxine (SYNTHROID, LEVOTHROID) 100 MCG tablet, TAKE 1 TABLET(100 MCG) BY MOUTH DAILY BEFORE BREAKFAST, Disp: 30 tablet, Rfl: 0 .  metFORMIN (GLUCOPHAGE) 500 MG tablet, Take 2 tablets (1,000 mg total) by mouth 2 (two) times daily with a meal. (take as written in dc summary), Disp: 120 tablet, Rfl: 0 .  Multiple Vitamin (MULTIVITAMIN) tablet, Take 1 tablet by mouth daily., Disp: , Rfl:  .  Diclofenac Sodium 2 % SOLN, Apply thin layer over affected areas on ribs 4 times daily as needed for pain., Disp: 1 Bottle, Rfl: 1 .  mirtazapine (REMERON) 30 MG tablet, Take 1 tablet (30 mg total) by mouth at bedtime., Disp: 30  tablet, Rfl: 5  Objective: BP 108/68 (BP Location: Left Arm, Patient Position: Sitting, Cuff Size: Normal)   Pulse 88   Temp 97.7 F (36.5 C) (Oral)   Ht 5' 3"  (1.6 m)   Wt 90 lb 8 oz (41.1 kg)   SpO2 97%   BMI 16.03 kg/m  General: Awake, appears stated age HEENT: MMM, EOMi Heart: RRR, no bruits Lungs: CTAB, no rales, wheezes or rhonchi. No accessory muscle use Abd: BS+, soft, NT, ND, no masses or organomegaly Psych: Age appropriate judgment and insight, normal affect and mood  Assessment and Plan: Diabetes mellitus type 2 in nonobese ([place]) - Plan: insulin lispro (HUMALOG) 100 UNIT/ML KiwkPen  Failure to thrive in adult - Plan: mirtazapine (REMERON) 30 MG tablet  Chest wall pain - Plan: Ambulatory referral to Sports Medicine, Diclofenac Sodium 2 % SOLN  Sliding scale for carb counting given- give 1 unit for every 15 g of carbs or 6 units if having a meal with unknown carb content. Cont same dose of Lantus 6 u nightly. Remeron for appetite. This will give [place] a more solid foundation for mealtime/basal insulin if she is consistently eating.  Refer to sports med for chest wall pain, try topical NSAID, stop narcotics as it has not been helpful. PO NSAIDs until Pennsaid comes. F/u in 1 mo. The patient and her son voiced understanding and agreement to the plan.  [place], DO 04/29/17  4:20 PM

## 2017-04-29 NOTE — Patient Instructions (Addendum)
If you do not hear anything about your sports medicine referral in the next week, call our office and ask for an update.  Someone will call you in the next couple days regarding your topical pain medicine. They will then mail you the medicine.  If you eat a good meal, use 6 units of Humalog per meal. Otherwise, use 1 unit of Humalog for every 15 g of carbs. Keep the Lantus the same.   Stop ibuprofen or anything that says NSAID.  Ice/cold pack over area for 10-15 min twice daily.  OK to take Tylenol 1000 mg (2 extra strength tabs) or 975 mg (3 regular strength tabs) every 6 hours as needed.  Let us know if you need anything.

## 2017-04-29 NOTE — Progress Notes (Signed)
Pre visit review using our clinic review tool, if applicable. No additional management support is needed unless otherwise documented below in the visit note. 

## 2017-04-30 ENCOUNTER — Encounter: Payer: Self-pay | Admitting: Family Medicine

## 2017-04-30 ENCOUNTER — Ambulatory Visit (INDEPENDENT_AMBULATORY_CARE_PROVIDER_SITE_OTHER): Payer: Medicare Other | Admitting: Family Medicine

## 2017-04-30 DIAGNOSIS — R0789 Other chest pain: Secondary | ICD-10-CM | POA: Diagnosis not present

## 2017-04-30 NOTE — Patient Instructions (Signed)
You have costochondritis. Take ibuprofen 600mg  three times a day with food OR aleve 2 tabs twice a day with food for pain and inflammation. Try the topical diclofenac gel up to 4 times a day. Tylenol 500mg  1-2 tabs three times a day as needed for pain in addition to this. Topical capsaicin or biofreeze may be helpful up to 4 times a day also. Icing OR heat, whichever feels better, 15 minutes at a time 3-4 times a day to the area. Consider physical therapy for your neck and shoulders but unfortunately this will not help with your chest pain.

## 2017-04-30 NOTE — Assessment & Plan Note (Signed)
consistent with costochondritis.  CT chest was reassuring done 1.5 months ago.  Do not think physical therapy would help for this condition.  Ibuprofen or aleve, topical voltaren gel, tylenol, other topical medications discussed.  Ice or heat.  F/u prn.

## 2017-04-30 NOTE — Progress Notes (Signed)
PCP and consultation requested by: [name], DO  Subjective:   HPI: Patient is a 67 y.o. female here for chest pain.  Patient reports for about 1.5 years she has had anterior chest pain. She has a history of Stage 4 lung cancer as well as recent hospitalization for pneumonia. She had a chest CT 03/22/17 without evidence bony lesions. Her pain is dull and anterior in the sternum area. Pain level 4/10. Pain worse with coughing and can radiate to right side of chest. Not worse with motion. Tried ibuprofen with mild benefit. No rash, fevers, chills. No change with drinking and eating. No numbness, skin changes. Has had radiation to right side of chest.  Past Medical History:  Diagnosis Date  . Adenocarcinoma of right lung, stage 4 ([facility]) 11/30/2015  . Complication of anesthesia    hard to wake up  . Diabetes mellitus type 2 in nonobese (HCC)   . Eczema   . Encounter for antineoplastic chemotherapy 11/30/2015  . Heart murmur    told at age 24 but never has had any problems  . History of radiation therapy 01/26/16-02/07/16   right pelvis 30 Gy in 10 fractions  . HTN (hypertension) 02/01/2016  . 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 Iron deficiency anemia due to chronic blood loss 12/10/2016    Current Outpatient Medications on File Prior to Visit  Medication Sig Dispense Refill  . aspirin 325 MG tablet Take 325 mg by mouth daily.    . blood glucose meter kit and supplies KIT Dispense based on patient and insurance preference. Use up to four times daily as directed. (FOR ICD-9 250.00, 250.01). 1 each 0  . cetirizine (ZYRTEC) 10 MG tablet TAKE 1 TABLET(10 MG) BY MOUTH DAILY 30 tablet 0  . Diclofenac Sodium 2 % SOLN Apply thin layer over affected areas on ribs 4 times daily as needed for pain. 1 Bottle 1  . dicyclomine (BENTYL) 10 MG capsule Take 1 capsule (10 mg total) by mouth 4 (four) times daily -  before meals and at bedtime. 30 capsule 0  . ferrous sulfate 325 (65  FE) MG tablet Take 325 mg by mouth daily.    . Insulin Glargine (LANTUS) 100 UNIT/ML Solostar Pen Inject 6 Units into the skin daily at 10 pm. 15 mL 1  . insulin lispro (HUMALOG) 100 UNIT/ML KiwkPen Inject 1 units before meals. 15 mL 11  . Insulin Pen Needle (PEN NEEDLES) 31G X 8 MM MISC To use with insulin 50 each 1  . levothyroxine (SYNTHROID, LEVOTHROID) 100 MCG tablet TAKE 1 TABLET(100 MCG) BY MOUTH DAILY BEFORE BREAKFAST 30 tablet 0  . metFORMIN (GLUCOPHAGE) 500 MG tablet Take 2 tablets (1,000 mg total) by mouth 2 (two) times daily with a meal. (take as written in dc summary) 120 tablet 0  . mirtazapine (REMERON) 30 MG tablet Take 1 tablet (30 mg total) by mouth at bedtime. 30 tablet 5  . Multiple Vitamin (MULTIVITAMIN) tablet Take 1 tablet by mouth daily.     No current facility-administered medications on file prior to visit.     Past Surgical History:  Procedure Laterality Date  . ABDOMINAL HYSTERECTOMY    . OOPHORECTOMY  1996   tumor removed from right ovary  . TONSILLECTOMY    . VIDEO BRONCHOSCOPY WITH ENDOBRONCHIAL NAVIGATION N/A 11/18/2015   Procedure: VIDEO BRONCHOSCOPY WITH ENDOBRONCHIAL NAVIGATION;  Surgeon: Melrose Nakayama, MD;  Location: [facility];  Service: Thoracic;  Laterality: N/A;  . VIDEO BRONCHOSCOPY WITH ENDOBRONCHIAL ULTRASOUND  N/A 11/18/2015   Procedure: VIDEO BRONCHOSCOPY WITH ENDOBRONCHIAL ULTRASOUND;  Surgeon: Melrose Nakayama, MD;  Location: [facility];  Service: Thoracic;  Laterality: N/A;    Allergies  Allergen Reactions  . Penicillins Swelling    SWELLING REACTION UNSPECIFIED  Has patient had a PCN reaction causing immediate rash, facial/tongue/throat swelling, SOB or lightheadedness with hypotension: Yes Has patient had a PCN reaction causing severe rash involving mucus membranes or skin necrosis: No Has patient had a PCN reaction that required hospitalization No Has patient had a PCN reaction occurring within the last 10 years: No If all of the above  answers are "NO", then may proceed with Cephalosporin use.    Social History   Socioeconomic History  . Marital status: Single    Spouse name: Not on file  . Number of children: Not on file  . Years of education: Not on file  . Highest education level: Not on file  Social Needs  . Financial resource strain: Not on file  . Food insecurity - worry: Not on file  . Food insecurity - inability: Not on file  . Transportation needs - medical: Not on file  . Transportation needs - non-medical: Not on file  Occupational History  . Occupation: retired - childcare  Tobacco Use  . Smoking status: Former Smoker    Packs/day: 0.50    Years: 30.00    Pack years: 15.00    Last attempt to quit: 11/01/2015    Years since quitting: 1.4  . Smokeless tobacco: Never Used  Substance and Sexual Activity  . Alcohol use: No  . Drug use: No  . Sexual activity: Not Currently    Birth control/protection: Surgical  Other Topics Concern  . Not on file  Social History Narrative  . Not on file    Family History  Problem Relation Age of Onset  . Colon cancer Neg Hx   . Esophageal cancer Neg Hx   . Rectal cancer Neg Hx   . Stomach cancer Neg Hx     BP 140/68   Pulse (!) 102   Ht _0  (1.6 m)   Wt 90 lb (40.8 kg)   BMI 15.94 kg/m   Review of Systems: See HPI above.     Objective:  Physical Exam:  Gen: NAD, comfortable in exam room  CV: RRR no MRG. Lungs:  CTAB no wheezes, rales, rhonchi. Chest: tenderness to palpation of mid-inferior sternum and sternocostal border R > L.  No pectoralis, other tenderness.  Bilateral shoulders: No swelling, ecchymoses.  Atrophy pectoralis, trapezius, paraspinal muscles.  No gross deformity. TTP as noted above.  No shoulder tenderness. FROM with painful arc. Strength 5/5 with empty can and resisted internal/external rotation.  No pain with simulated bench press, shoulder flys. NV intact distally.   Assessment & Plan:  1. Chest pain - consistent with  costochondritis.  CT chest was reassuring done 1.5 months ago.  Do not think physical therapy would help for this condition.  Ibuprofen or aleve, topical voltaren gel, tylenol, other topical medications discussed.  Ice or heat.  F/u prn.

## 2017-05-01 ENCOUNTER — Ambulatory Visit: Payer: Medicare Other | Admitting: Family Medicine

## 2017-05-06 ENCOUNTER — Encounter: Payer: Self-pay | Admitting: Internal Medicine

## 2017-05-06 ENCOUNTER — Inpatient Hospital Stay: Payer: Medicare Other

## 2017-05-06 ENCOUNTER — Inpatient Hospital Stay: Payer: Medicare Other | Attending: Oncology | Admitting: Internal Medicine

## 2017-05-06 VITALS — BP 134/59 | HR 110 | Temp 97.6°F | Resp 20 | Ht 63.0 in | Wt 87.1 lb

## 2017-05-06 DIAGNOSIS — E1165 Type 2 diabetes mellitus with hyperglycemia: Secondary | ICD-10-CM | POA: Diagnosis not present

## 2017-05-06 DIAGNOSIS — C3491 Malignant neoplasm of unspecified part of right bronchus or lung: Secondary | ICD-10-CM

## 2017-05-06 DIAGNOSIS — C3490 Malignant neoplasm of unspecified part of unspecified bronchus or lung: Secondary | ICD-10-CM

## 2017-05-06 DIAGNOSIS — Z5112 Encounter for antineoplastic immunotherapy: Secondary | ICD-10-CM | POA: Diagnosis not present

## 2017-05-06 DIAGNOSIS — C7951 Secondary malignant neoplasm of bone: Secondary | ICD-10-CM | POA: Insufficient documentation

## 2017-05-06 DIAGNOSIS — E039 Hypothyroidism, unspecified: Secondary | ICD-10-CM | POA: Insufficient documentation

## 2017-05-06 DIAGNOSIS — I10 Essential (primary) hypertension: Secondary | ICD-10-CM | POA: Diagnosis not present

## 2017-05-06 DIAGNOSIS — R5382 Chronic fatigue, unspecified: Secondary | ICD-10-CM

## 2017-05-06 DIAGNOSIS — C3431 Malignant neoplasm of lower lobe, right bronchus or lung: Secondary | ICD-10-CM | POA: Diagnosis not present

## 2017-05-06 LAB — CBC WITH DIFFERENTIAL/PLATELET
Basophils Absolute: 0 10*3/uL (ref 0.0–0.1)
Basophils Relative: 0 %
EOS ABS: 0.1 10*3/uL (ref 0.0–0.5)
Eosinophils Relative: 1 %
HEMATOCRIT: 32.4 % — AB (ref 34.8–46.6)
HEMOGLOBIN: 11 g/dL — AB (ref 11.6–15.9)
Lymphocytes Relative: 10 %
Lymphs Abs: 1.2 10*3/uL (ref 0.9–3.3)
MCH: 27.6 pg (ref 25.1–34.0)
MCHC: 34 g/dL (ref 31.5–36.0)
MCV: 81.2 fL (ref 79.5–101.0)
Monocytes Absolute: 0.8 10*3/uL (ref 0.1–0.9)
Monocytes Relative: 7 %
NEUTROS ABS: 9.7 10*3/uL — AB (ref 1.5–6.5)
NEUTROS PCT: 82 %
Platelets: 390 10*3/uL (ref 145–400)
RBC: 3.99 MIL/uL (ref 3.70–5.45)
RDW: 14.2 % (ref 11.2–14.5)
WBC: 11.7 10*3/uL — AB (ref 3.9–10.3)

## 2017-05-06 LAB — COMPREHENSIVE METABOLIC PANEL
ALT: 7 U/L (ref 0–55)
ANION GAP: 15 — AB (ref 3–11)
AST: 12 U/L (ref 5–34)
Albumin: 2.7 g/dL — ABNORMAL LOW (ref 3.5–5.0)
Alkaline Phosphatase: 143 U/L (ref 40–150)
BUN: 7 mg/dL (ref 7–26)
CO2: 22 mmol/L (ref 22–29)
Calcium: 9.3 mg/dL (ref 8.4–10.4)
Chloride: 98 mmol/L (ref 98–109)
Creatinine, Ser: 0.95 mg/dL (ref 0.60–1.10)
Glucose, Bld: 248 mg/dL — ABNORMAL HIGH (ref 70–140)
POTASSIUM: 3.3 mmol/L — AB (ref 3.5–5.1)
SODIUM: 135 mmol/L — AB (ref 136–145)
Total Bilirubin: 0.5 mg/dL (ref 0.2–1.2)
Total Protein: 7.9 g/dL (ref 6.4–8.3)

## 2017-05-06 LAB — TSH: TSH: 1.156 u[IU]/mL (ref 0.308–3.960)

## 2017-05-06 MED ORDER — SODIUM CHLORIDE 0.9 % IV SOLN
Freq: Once | INTRAVENOUS | Status: AC
  Administered 2017-05-06: 12:00:00 via INTRAVENOUS

## 2017-05-06 MED ORDER — SODIUM CHLORIDE 0.9 % IV SOLN
200.0000 mg | Freq: Once | INTRAVENOUS | Status: AC
  Administered 2017-05-06: 200 mg via INTRAVENOUS
  Filled 2017-05-06: qty 8

## 2017-05-06 NOTE — Patient Instructions (Signed)
[facility] Discharge Instructions for Patients Receiving Chemotherapy  Today you received the following chemotherapy agents: Pembrolizumab Beryle Flock).  To help prevent nausea and vomiting after your treatment, we encourage you to take your nausea medication as prescribed.  If you develop nausea and vomiting that is not controlled by your nausea medication, call the clinic.   BELOW ARE SYMPTOMS THAT SHOULD BE REPORTED IMMEDIATELY:  *FEVER GREATER THAN 100.5 F  *CHILLS WITH OR WITHOUT FEVER  NAUSEA AND VOMITING THAT IS NOT CONTROLLED WITH YOUR NAUSEA MEDICATION  *UNUSUAL SHORTNESS OF BREATH  *UNUSUAL BRUISING OR BLEEDING  TENDERNESS IN MOUTH AND THROAT WITH OR WITHOUT PRESENCE OF ULCERS  *URINARY PROBLEMS  *BOWEL PROBLEMS  UNUSUAL RASH Items with * indicate a potential emergency and should be followed up as soon as possible.  Feel free to call the clinic should you have any questions or concerns. The clinic phone number is [phone].  Please show the [facility] at check-in to the Emergency Department and triage nurse.

## 2017-05-06 NOTE — Progress Notes (Signed)
[facility] [phone]   [phone]  OFFICE PROGRESS NOTE  Brittany Pal, DO [address] [address]  DIAGNOSIS: Stage IV (T1b, N2, M1 B) non-small cell lung cancer, adenocarcinoma with positive PDL 1 expression (99%), presented with right lower lobe lung nodule with metastatic right hilar and subcarinal lymphadenopathy as well as bone metastasis diagnosed in September 2017.  Genomic Alterations Identified? NF1 G363f*9 ARID1B [id number] PBRM1 [id number] TP53 [id number] Additional Findings? Microsatellite status MS-Stable Tumor Mutation Burden TMB-Intermediate; 7 Muts/Mb Additional Disease-relevant Genes with No Reportable Alterations Identified? EGFR KRAS ALK BRAF MET ERBB2 RET ROS1  PRIOR THERAPY: Palliative radiotherapy to the metastatic bone lesion under the care of Dr. KinaSondra ComeURRENT THERAPY: First-line treatment with immunotherapy with Ketruda (pembrolizumab) 200 mg IV every 3 weeks status post 23 cycles. First cycle was given 67.  INTERVAL HISTORY: Brittany Mikruty67. female returns to the clinic today for follow-up visit accompanied by her sister.  The patient is complaining of increasing fatigue as well as lack of appetite and weight loss.  She also has intermittent pain in the axilla bilaterally as well as right chest wall.  She denied having any shortness of breath, cough or hemoptysis.  She denied having any nausea, vomiting, diarrhea or constipation.  She has no headache or visual changes.  She continues to tolerate her treatment with immunotherapy fairly well.  She is here today for evaluation before starting cycle #24.  MEDICAL HISTORY: Past Medical History:  Diagnosis Date  . Adenocarcinoma of right lung, stage 4 ([facility])[place]/05/2015  . Complication of anesthesia    hard to wake up  . Diabetes mellitus type 2 in nonobese ([facility]CC)   . Eczema   . Encounter for antineoplastic  chemotherapy 67  . Heart murmur    told at age 67 b52 never has had any problems  . History of radiation therapy 66-66   right pelvis 30 Gy in 10 fractions  . HTN (hypertension) 67  . Hypothyroidism (acquired) 67  . Iron deficiency anemia due to chronic blood loss 67    ALLERGIES:  is allergic to penicillins.  MEDICATIONS:  Current Outpatient Medications  Medication Sig Dispense Refill  . aspirin 325 MG tablet Take 325 mg by mouth daily.    . blood glucose meter kit and supplies KIT Dispense based on patient and insurance preference. Use up to four times daily as directed. (FOR ICD-9 250.00, 250.01). 1 each 0  . cetirizine (ZYRTEC) 10 MG tablet TAKE 1 TABLET(10 MG) BY MOUTH DAILY 30 tablet 0  . Diclofenac Sodium 2 % SOLN Apply thin layer over affected areas on ribs 4 times daily as needed for pain. 1 Bottle 1  . dicyclomine (BENTYL) 10 MG capsule Take 1 capsule (10 mg total) by mouth 4 (four) times daily -  before meals and at bedtime. 30 capsule 0  . ferrous sulfate 325 (65 FE) MG tablet Take 325 mg by mouth daily.    . Insulin Glargine (LANTUS) 100 UNIT/ML Solostar Pen Inject 6 Units into the skin daily at 10 pm. 15 mL 1  . insulin lispro (HUMALOG) 100 UNIT/ML KiwkPen Inject 1 units before meals. 15 mL 11  . Insulin Pen Needle (PEN NEEDLES) 31G X 8 MM MISC To use with insulin 50 each 1  . levothyroxine (SYNTHROID, LEVOTHROID) 100 MCG tablet TAKE 1 TABLET(100 MCG) BY MOUTH DAILY BEFORE BREAKFAST 30 tablet 0  . metFORMIN (GLUCOPHAGE)  500 MG tablet Take 2 tablets (1,000 mg total) by mouth 2 (two) times daily with a meal. (take as written in dc summary) 120 tablet 0  . mirtazapine (REMERON) 30 MG tablet Take 1 tablet (30 mg total) by mouth at bedtime. 30 tablet 5  . Multiple Vitamin (MULTIVITAMIN) tablet Take 1 tablet by mouth daily.     No current facility-administered medications for this visit.     SURGICAL HISTORY:  Past Surgical History:    Procedure Laterality Date  . ABDOMINAL HYSTERECTOMY    . OOPHORECTOMY  1996   tumor removed from right ovary  . TONSILLECTOMY    . VIDEO BRONCHOSCOPY WITH ENDOBRONCHIAL NAVIGATION N/A 67   Procedure: VIDEO BRONCHOSCOPY WITH ENDOBRONCHIAL NAVIGATION;  Surgeon: Melrose Nakayama, MD;  Location: [facility];  Service: Thoracic;  Laterality: N/A;  . VIDEO BRONCHOSCOPY WITH ENDOBRONCHIAL ULTRASOUND N/A 67   Procedure: VIDEO BRONCHOSCOPY WITH ENDOBRONCHIAL ULTRASOUND;  Surgeon: Melrose Nakayama, MD;  Location: [facility];  Service: Thoracic;  Laterality: N/A;    REVIEW OF SYSTEMS:  A comprehensive review of systems was negative except for: Constitutional: positive for anorexia, fatigue and weight loss Musculoskeletal: positive for bone pain   PHYSICAL EXAMINATION: General appearance: alert, cooperative, fatigued and no distress Head: Normocephalic, without obvious abnormality, atraumatic Neck: no adenopathy, no JVD, supple, symmetrical, trachea midline and thyroid not enlarged, symmetric, no tenderness/mass/nodules Lymph nodes: Cervical, supraclavicular, and axillary nodes normal. Resp: clear to auscultation bilaterally Back: symmetric, no curvature. ROM normal. No CVA tenderness. Cardio: normal apical impulse GI: soft, non-tender; bowel sounds normal; no masses,  no organomegaly Extremities: extremities normal, atraumatic, no cyanosis or edema  ECOG PERFORMANCE STATUS: 1 - Symptomatic but completely ambulatory  Blood pressure (!) 134/59, pulse (!) 110, temperature 97.6 F (36.4 C), temperature source Oral, resp. rate 20, height 5' 3"  (1.6 m), weight 87 lb 1.6 oz (39.5 kg), SpO2 98 %.  LABORATORY DATA: Lab Results  Component Value Date   WBC 11.7 (H) 066   HGB 11.0 (L) 066   HCT 32.4 (L) 066   MCV 81.2 066   PLT 390 066      Chemistry      Component Value Date/Time   NA 134 (L) 04/15/2017 1008   NA 140 66 1241   K 3.6  04/15/2017 1008   K 4.3 66 1241   CL 97 (L) 04/15/2017 1008   CO2 28 04/15/2017 1008   CO2 24 66 1241   BUN 7 04/15/2017 1008   BUN 13.3 66 1241   CREATININE 0.99 04/15/2017 1008   CREATININE 1.0 66 1241      Component Value Date/Time   CALCIUM 9.2 04/15/2017 1008   CALCIUM 9.5 66 1241   ALKPHOS 166 (H) 04/15/2017 1008   ALKPHOS 395 (H) 66 1241   AST 16 04/15/2017 1008   AST 61 (H) 66 1241   ALT 6 04/15/2017 1008   ALT 53 66 1241   BILITOT 0.3 04/15/2017 1008   BILITOT 0.26 66 1241       RADIOGRAPHIC STUDIES: Dg Chest 2 View  Result Date: 04/08/2017 CLINICAL DATA:  67 year old female with a history of hyperglycemia, cough, altered mental status. Known lung cancer undergoing therapy EXAM: CHEST  2 VIEW COMPARISON:  03/22/2017 FINDINGS: Cardiomediastinal silhouette unchanged in size and contour. Airspace opacity in the right hilar and infrahilar region. No pneumothorax or pleural effusion. Stigmata of emphysema, with increased retrosternal airspace, flattened hemidiaphragms, increased AP diameter, and hyperinflation on the AP  view. Coarsened interstitial markings. No displaced fracture. IMPRESSION: Airspace opacity in the right hilar and infrahilar region in this patient with known history of cancer undergoing therapy. Opacity may reflect treatment changes, or alternatively a new pneumonia. Emphysema Electronically Signed   By: Corrie Mckusick D.O.   On: 04/08/2017 15:32   Ct Head Wo Contrast  Result Date: 04/08/2017 CLINICAL DATA:  Altered mental status, hyperglycemia, and cough. History of stage IV lung cancer. EXAM: CT HEAD WITHOUT CONTRAST TECHNIQUE: Contiguous axial images were obtained from the base of the skull through the vertex without intravenous contrast. COMPARISON:  Brain MRI 10/27/2015 FINDINGS: Brain: There is no evidence of acute infarct, intracranial hemorrhage, mass, midline shift, or extra-axial  fluid collection. The ventricles and sulci are normal. There is at most subtle low-density in the cerebral white matter corresponding to T2 hyperintense foci on prior MRI. Vascular: Calcified atherosclerosis at the skull base. No hyperdense vessel. Skull: No fracture or focal osseous lesion. Sinuses/Orbits: Visualized paranasal sinuses and mastoid air cells are clear. Visualized orbits are unremarkable. Other: None. IMPRESSION: No evidence of acute intracranial abnormality. Electronically Signed   By: Logan Bores M.D.   On: 04/08/2017 15:33    ASSESSMENT AND PLAN:  This is a very pleasant 67 years old African-American female with metastatic non-small cell lung cancer, adenocarcinoma with positive PDL 1 expression of 99%. She is currently undergoing treatment with Ketruda 200 mg IV every 3 weeks, status post 23 cycles. The patient continues to tolerate this treatment fairly well with no concerning complaints. I recommended for the patient to continue her treatment with [facility] as a schedule and she will proceed with cycle #24 today. For the lack of appetite and weight loss, she is currently on Remeron and I referred the patient to the dietitian at the cancer center for evaluation of her condition. For the history of diabetes mellitus and recent hyperglycemia, she is scheduled to see her primary care physician later today for evaluation and management of her medications. I will see her back for follow-up visit in 3 weeks for evaluation after repeating CT scan of the chest, abdomen and pelvis for restaging of her disease. She was advised to call immediately if she has any concerning symptoms in the interval. The patient voices understanding of current disease status and treatment options and is in agreement with the current care plan. All questions were answered. The patient knows to call the clinic with any problems, questions or concerns. We can certainly see the patient much sooner if  necessary.  Disclaimer: This note was dictated with voice recognition software. Similar sounding words can inadvertently be transcribed and may not be corrected upon review.

## 2017-05-07 ENCOUNTER — Other Ambulatory Visit: Payer: Self-pay | Admitting: Internal Medicine

## 2017-05-07 ENCOUNTER — Telehealth: Payer: Self-pay | Admitting: Internal Medicine

## 2017-05-07 ENCOUNTER — Other Ambulatory Visit: Payer: Self-pay | Admitting: Family Medicine

## 2017-05-07 NOTE — Telephone Encounter (Signed)
Next 3 cycles already scheduled per 3/11 los. No additional appts added but Nutrition referral

## 2017-05-08 ENCOUNTER — Emergency Department (HOSPITAL_COMMUNITY): Payer: Medicare Other

## 2017-05-08 ENCOUNTER — Inpatient Hospital Stay (HOSPITAL_COMMUNITY)
Admission: EM | Admit: 2017-05-08 | Discharge: 2017-05-10 | DRG: 637 | Disposition: A | Discharge status: Home / Self Care | Payer: Medicare Other | Attending: Internal Medicine | Admitting: Internal Medicine

## 2017-05-08 ENCOUNTER — Encounter (HOSPITAL_COMMUNITY): Payer: Self-pay

## 2017-05-08 ENCOUNTER — Inpatient Hospital Stay (HOSPITAL_COMMUNITY): Payer: Medicare Other

## 2017-05-08 ENCOUNTER — Other Ambulatory Visit: Payer: Self-pay

## 2017-05-08 DIAGNOSIS — Z79899 Other long term (current) drug therapy: Secondary | ICD-10-CM

## 2017-05-08 DIAGNOSIS — E43 Unspecified severe protein-calorie malnutrition: Secondary | ICD-10-CM | POA: Diagnosis present

## 2017-05-08 DIAGNOSIS — Z90710 Acquired absence of both cervix and uterus: Secondary | ICD-10-CM

## 2017-05-08 DIAGNOSIS — R918 Other nonspecific abnormal finding of lung field: Secondary | ICD-10-CM | POA: Diagnosis present

## 2017-05-08 DIAGNOSIS — K858 Other acute pancreatitis without necrosis or infection: Secondary | ICD-10-CM | POA: Diagnosis not present

## 2017-05-08 DIAGNOSIS — Z7984 Long term (current) use of oral hypoglycemic drugs: Secondary | ICD-10-CM | POA: Diagnosis not present

## 2017-05-08 DIAGNOSIS — E872 Acidosis, unspecified: Secondary | ICD-10-CM

## 2017-05-08 DIAGNOSIS — R000 Tachycardia, unspecified: Secondary | ICD-10-CM

## 2017-05-08 DIAGNOSIS — R5382 Chronic fatigue, unspecified: Secondary | ICD-10-CM | POA: Diagnosis present

## 2017-05-08 DIAGNOSIS — N179 Acute kidney failure, unspecified: Secondary | ICD-10-CM | POA: Diagnosis present

## 2017-05-08 DIAGNOSIS — C3490 Malignant neoplasm of unspecified part of unspecified bronchus or lung: Secondary | ICD-10-CM | POA: Diagnosis not present

## 2017-05-08 DIAGNOSIS — Z923 Personal history of irradiation: Secondary | ICD-10-CM | POA: Diagnosis not present

## 2017-05-08 DIAGNOSIS — E119 Type 2 diabetes mellitus without complications: Secondary | ICD-10-CM

## 2017-05-08 DIAGNOSIS — Z880 Allergy status to penicillin: Secondary | ICD-10-CM

## 2017-05-08 DIAGNOSIS — F17210 Nicotine dependence, cigarettes, uncomplicated: Secondary | ICD-10-CM | POA: Diagnosis present

## 2017-05-08 DIAGNOSIS — I10 Essential (primary) hypertension: Secondary | ICD-10-CM | POA: Diagnosis not present

## 2017-05-08 DIAGNOSIS — E876 Hypokalemia: Secondary | ICD-10-CM | POA: Diagnosis not present

## 2017-05-08 DIAGNOSIS — C3491 Malignant neoplasm of unspecified part of right bronchus or lung: Secondary | ICD-10-CM | POA: Diagnosis not present

## 2017-05-08 DIAGNOSIS — Z8701 Personal history of pneumonia (recurrent): Secondary | ICD-10-CM

## 2017-05-08 DIAGNOSIS — R627 Adult failure to thrive: Secondary | ICD-10-CM | POA: Diagnosis present

## 2017-05-08 DIAGNOSIS — Z9221 Personal history of antineoplastic chemotherapy: Secondary | ICD-10-CM | POA: Diagnosis not present

## 2017-05-08 DIAGNOSIS — Z90721 Acquired absence of ovaries, unilateral: Secondary | ICD-10-CM

## 2017-05-08 DIAGNOSIS — E1110 Type 2 diabetes mellitus with ketoacidosis without coma: Principal | ICD-10-CM | POA: Diagnosis present

## 2017-05-08 DIAGNOSIS — E039 Hypothyroidism, unspecified: Secondary | ICD-10-CM | POA: Diagnosis present

## 2017-05-08 DIAGNOSIS — Z79890 Hormone replacement therapy (postmenopausal): Secondary | ICD-10-CM

## 2017-05-08 DIAGNOSIS — Z681 Body mass index (BMI) 19 or less, adult: Secondary | ICD-10-CM

## 2017-05-08 DIAGNOSIS — T383X6A Underdosing of insulin and oral hypoglycemic [antidiabetic] drugs, initial encounter: Secondary | ICD-10-CM | POA: Diagnosis present

## 2017-05-08 DIAGNOSIS — Z720 Tobacco use: Secondary | ICD-10-CM | POA: Diagnosis present

## 2017-05-08 DIAGNOSIS — R6510 Systemic inflammatory response syndrome (SIRS) of non-infectious origin without acute organ dysfunction: Secondary | ICD-10-CM | POA: Diagnosis present

## 2017-05-08 DIAGNOSIS — R410 Disorientation, unspecified: Secondary | ICD-10-CM

## 2017-05-08 DIAGNOSIS — Z91128 Patient's intentional underdosing of medication regimen for other reason: Secondary | ICD-10-CM | POA: Diagnosis not present

## 2017-05-08 DIAGNOSIS — R404 Transient alteration of awareness: Secondary | ICD-10-CM | POA: Diagnosis not present

## 2017-05-08 DIAGNOSIS — R112 Nausea with vomiting, unspecified: Secondary | ICD-10-CM | POA: Diagnosis not present

## 2017-05-08 DIAGNOSIS — R64 Cachexia: Secondary | ICD-10-CM | POA: Diagnosis not present

## 2017-05-08 DIAGNOSIS — R930 Abnormal findings on diagnostic imaging of skull and head, not elsewhere classified: Secondary | ICD-10-CM | POA: Diagnosis not present

## 2017-05-08 DIAGNOSIS — R739 Hyperglycemia, unspecified: Secondary | ICD-10-CM | POA: Diagnosis not present

## 2017-05-08 DIAGNOSIS — G9340 Encephalopathy, unspecified: Secondary | ICD-10-CM | POA: Diagnosis not present

## 2017-05-08 DIAGNOSIS — G9341 Metabolic encephalopathy: Secondary | ICD-10-CM | POA: Diagnosis present

## 2017-05-08 DIAGNOSIS — Z7982 Long term (current) use of aspirin: Secondary | ICD-10-CM

## 2017-05-08 DIAGNOSIS — E11649 Type 2 diabetes mellitus with hypoglycemia without coma: Secondary | ICD-10-CM | POA: Diagnosis not present

## 2017-05-08 DIAGNOSIS — E1169 Type 2 diabetes mellitus with other specified complication: Secondary | ICD-10-CM | POA: Diagnosis not present

## 2017-05-08 DIAGNOSIS — E440 Moderate protein-calorie malnutrition: Secondary | ICD-10-CM

## 2017-05-08 LAB — CBC WITH DIFFERENTIAL/PLATELET
BASOS PCT: 0 %
Basophils Absolute: 0 10*3/uL (ref 0.0–0.1)
EOS ABS: 0 10*3/uL (ref 0.0–0.7)
Eosinophils Relative: 0 %
HEMATOCRIT: 36.2 % (ref 36.0–46.0)
HEMOGLOBIN: 12.1 g/dL (ref 12.0–15.0)
LYMPHS PCT: 5 %
Lymphs Abs: 1 10*3/uL (ref 0.7–4.0)
MCH: 28 pg (ref 26.0–34.0)
MCHC: 33.4 g/dL (ref 30.0–36.0)
MCV: 83.8 fL (ref 78.0–100.0)
MONOS PCT: 4 %
Monocytes Absolute: 0.8 10*3/uL (ref 0.1–1.0)
NEUTROS ABS: 17.7 10*3/uL — AB (ref 1.7–7.7)
Neutrophils Relative %: 91 %
Platelets: 607 10*3/uL — ABNORMAL HIGH (ref 150–400)
RBC: 4.32 MIL/uL (ref 3.87–5.11)
RDW: 14.6 % (ref 11.5–15.5)
WBC: 19.5 10*3/uL — ABNORMAL HIGH (ref 4.0–10.5)

## 2017-05-08 LAB — COMPREHENSIVE METABOLIC PANEL
ALT: 11 U/L — AB (ref 14–54)
AST: 17 U/L (ref 15–41)
Albumin: 3.3 g/dL — ABNORMAL LOW (ref 3.5–5.0)
Alkaline Phosphatase: 156 U/L — ABNORMAL HIGH (ref 38–126)
BUN: 21 mg/dL — ABNORMAL HIGH (ref 6–20)
CHLORIDE: 103 mmol/L (ref 101–111)
CO2: <7 mmol/L — ABNORMAL LOW (ref 22–32)
CREATININE: 1.72 mg/dL — AB (ref 0.44–1.00)
Calcium: 9.9 mg/dL (ref 8.9–10.3)
GFR, EST AFRICAN AMERICAN: 35 mL/min — AB (ref >60)
GFR, EST NON AFRICAN AMERICAN: 30 mL/min — AB (ref >60)
Glucose, Bld: 688 mg/dL (ref 65–99)
Potassium: 5.3 mmol/L — ABNORMAL HIGH (ref 3.5–5.1)
Sodium: 138 mmol/L (ref 135–145)
Total Bilirubin: 1.6 mg/dL — ABNORMAL HIGH (ref 0.3–1.2)
Total Protein: 8.8 g/dL — ABNORMAL HIGH (ref 6.5–8.1)

## 2017-05-08 LAB — LACTIC ACID, PLASMA: Lactic Acid, Venous: 3.1 mmol/L (ref 0.5–1.9)

## 2017-05-08 LAB — BLOOD GAS, VENOUS
ACID-BASE EXCESS: 28 mmol/L — AB (ref 0.0–2.0)
Acid-base deficit: 25 mmol/L — ABNORMAL HIGH (ref 0.0–2.0)
BICARBONATE: 4.4 mmol/L — AB (ref 20.0–28.0)
Drawn by: 270211
O2 Saturation: 88.7 %
PATIENT TEMPERATURE: 98.6
pCO2, Ven: 20.3 mmHg — ABNORMAL LOW (ref 44.0–60.0)
pH, Ven: 6.969 — CL (ref 7.250–7.430)
pO2, Ven: 85.7 mmHg — ABNORMAL HIGH (ref 32.0–45.0)

## 2017-05-08 LAB — URINALYSIS, ROUTINE W REFLEX MICROSCOPIC
Bilirubin Urine: NEGATIVE
Glucose, UA: >=500 mg/dL — AB
KETONES UR: 80 mg/dL — AB
Nitrite: NEGATIVE
Protein, ur: 30 mg/dL — AB
Specific Gravity, Urine: 1.014 (ref 1.005–1.030)
pH: 5 (ref 5.0–8.0)

## 2017-05-08 LAB — BASIC METABOLIC PANEL
Anion gap: 11 (ref 5–15)
BUN: 16 mg/dL (ref 6–20)
CHLORIDE: 122 mmol/L — AB (ref 101–111)
CO2: 15 mmol/L — ABNORMAL LOW (ref 22–32)
CREATININE: 0.97 mg/dL (ref 0.44–1.00)
Calcium: 8.3 mg/dL — ABNORMAL LOW (ref 8.9–10.3)
GFR calc Af Amer: >60 mL/min (ref >60)
GFR calc non Af Amer: 60 mL/min — ABNORMAL LOW (ref >60)
GLUCOSE: 129 mg/dL — AB (ref 65–99)
POTASSIUM: 4.1 mmol/L (ref 3.5–5.1)
Sodium: 148 mmol/L — ABNORMAL HIGH (ref 135–145)

## 2017-05-08 LAB — LIPASE, BLOOD: LIPASE: 56 U/L — AB (ref 11–51)

## 2017-05-08 LAB — I-STAT CHEM 8, ED
BUN: 19 mg/dL (ref 6–20)
CREATININE: 1.2 mg/dL — AB (ref 0.44–1.00)
Calcium, Ion: 1.29 mmol/L (ref 1.15–1.40)
Chloride: 109 mmol/L (ref 101–111)
GLUCOSE: 657 mg/dL — AB (ref 65–99)
HEMATOCRIT: 40 % (ref 36.0–46.0)
HEMOGLOBIN: 13.6 g/dL (ref 12.0–15.0)
Potassium: 5.1 mmol/L (ref 3.5–5.1)
Sodium: 137 mmol/L (ref 135–145)
TCO2: 8 mmol/L — AB (ref 22–32)

## 2017-05-08 LAB — CBG MONITORING, ED
GLUCOSE-CAPILLARY: 114 mg/dL — AB (ref 65–99)
GLUCOSE-CAPILLARY: 140 mg/dL — AB (ref 65–99)
GLUCOSE-CAPILLARY: 424 mg/dL — AB (ref 65–99)
GLUCOSE-CAPILLARY: 530 mg/dL — AB (ref 65–99)
GLUCOSE-CAPILLARY: 536 mg/dL — AB (ref 65–99)
Glucose-Capillary: 492 mg/dL — ABNORMAL HIGH (ref 65–99)
Glucose-Capillary: 382 mg/dL — ABNORMAL HIGH (ref 65–99)
Glucose-Capillary: 282 mg/dL — ABNORMAL HIGH (ref 65–99)
Glucose-Capillary: 204 mg/dL — ABNORMAL HIGH (ref 65–99)
Glucose-Capillary: 90 mg/dL (ref 65–99)

## 2017-05-08 LAB — HEMOGLOBIN A1C
Hgb A1c MFr Bld: 8.8 % — ABNORMAL HIGH (ref 4.8–5.6)
MEAN PLASMA GLUCOSE: 205.86 mg/dL

## 2017-05-08 LAB — I-STAT CG4 LACTIC ACID, ED
LACTIC ACID, VENOUS: 2.67 mmol/L — AB (ref 0.5–1.9)
Lactic Acid, Venous: 4.83 mmol/L (ref 0.5–1.9)

## 2017-05-08 MED ORDER — SODIUM CHLORIDE 0.9 % IV SOLN
INTRAVENOUS | Status: DC
  Administered 2017-05-08: 125 mL/h via INTRAVENOUS

## 2017-05-08 MED ORDER — ONDANSETRON HCL 4 MG/2ML IJ SOLN
4.0000 mg | Freq: Once | INTRAMUSCULAR | Status: AC
  Administered 2017-05-08: 4 mg via INTRAVENOUS
  Filled 2017-05-08: qty 2

## 2017-05-08 MED ORDER — SODIUM CHLORIDE 0.9 % IV SOLN: INTRAVENOUS | Status: DC

## 2017-05-08 MED ORDER — VANCOMYCIN HCL IN DEXTROSE 750-5 MG/150ML-% IV SOLN: 750.0000 mg | INTRAVENOUS | Status: DC

## 2017-05-08 MED ORDER — LEVOFLOXACIN IN D5W 750 MG/150ML IV SOLN: 750.0000 mg | INTRAVENOUS | Status: DC

## 2017-05-08 MED ORDER — SODIUM CHLORIDE 0.9 % IV BOLUS (SEPSIS)
1000.0000 mL | Freq: Once | INTRAVENOUS | Status: AC
  Administered 2017-05-08: 1000 mL via INTRAVENOUS

## 2017-05-08 MED ORDER — SODIUM CHLORIDE 0.45 % IV SOLN
INTRAVENOUS | Status: DC
  Administered 2017-05-08: 20:00:00 via INTRAVENOUS
  Filled 2017-05-08 (×2): qty 100

## 2017-05-08 MED ORDER — SODIUM CHLORIDE 0.9 % IV SOLN
INTRAVENOUS | Status: DC
  Administered 2017-05-08: 4.8 [IU]/h via INTRAVENOUS
  Filled 2017-05-08: qty 1

## 2017-05-08 MED ORDER — NICOTINE 14 MG/24HR TD PT24
14.0000 mg | MEDICATED_PATCH | Freq: Every day | TRANSDERMAL | Status: DC
  Administered 2017-05-09 – 2017-05-10 (×2): 14 mg via TRANSDERMAL
  Filled 2017-05-08 (×2): qty 1

## 2017-05-08 MED ORDER — DEXTROSE-NACL 5-0.45 % IV SOLN
INTRAVENOUS | Status: DC
  Administered 2017-05-08: 21:00:00 via INTRAVENOUS

## 2017-05-08 MED ORDER — LEVOFLOXACIN IN D5W 750 MG/150ML IV SOLN
750.0000 mg | Freq: Once | INTRAVENOUS | Status: AC
  Administered 2017-05-08: 750 mg via INTRAVENOUS
  Filled 2017-05-08: qty 150

## 2017-05-08 MED ORDER — VANCOMYCIN HCL IN DEXTROSE 1-5 GM/200ML-% IV SOLN
1000.0000 mg | Freq: Once | INTRAVENOUS | Status: AC
  Administered 2017-05-08: 1000 mg via INTRAVENOUS
  Filled 2017-05-08: qty 200

## 2017-05-08 NOTE — H&P (Signed)
History and Physical    Brittany Mercado AJO:[id number] DOB: 03/12/1950 DOA: 05/08/2017  PCP: [name], DO  Patient coming from: Home.   I have personally briefly reviewed patient's old medical records in [place]  Chief Complaint: elevated blood sugars.   HPI: Brittany Mercado is a 67 y.o. 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 11.19, recently diag type 2 DM. Hypertension, hypothyroidism, was brought in by family for hyperglycemia and confusion. Pt lives with her son, but her daughter is at bedside and gave [place] most of the history. Pt is confused, delirious and couldn't provide any history. As per the daughter, since she was diag with Dm, her sugars have been fluctuating and she couldn't control them with long acting insulin. Her appetite has diminished, has poor po intake and she continues to smoke cigarettes at home. Daughter reports nausea and vomiting since last night, with constipation. No fever or chills. No sob,. Chronic chest pain from the lung cancer. Per daughter, she has generalized weakness and couldn't ambulate more than 10 steps without assistance. On arrival to ED, she was found to be delirious, tachycardic, hypotensive, elevated lactic acid. Further labs reveal creatinine of 1.2, lactic acid of 4.8, in DKA, AND cbg of 657. ABG reveals ph of 6.969. She was referred to medical service for admission for DKA AND failure to thrive.    Review of Systems: pt delirious and ROS DETAILED couldn't be obtained. .    Past Medical History:  Diagnosis Date  . Adenocarcinoma of right lung, stage 4 ([place]) 11/30/2015  . Complication of anesthesia    hard to wake up  . Diabetes mellitus type 2 in nonobese (HCC)   . Eczema   . Encounter for antineoplastic chemotherapy 11/30/2015  . Heart murmur    told at age 45 but never has had any problems  . History of radiation  therapy 01/26/16-02/07/16   right pelvis 30 Gy in 10 fractions  . HTN (hypertension) 02/01/2016  . 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 Iron deficiency anemia due to chronic blood loss 12/10/2016    Past Surgical History:  Procedure Laterality Date  . ABDOMINAL HYSTERECTOMY    . OOPHORECTOMY  1996   tumor removed from right ovary  . TONSILLECTOMY    . VIDEO BRONCHOSCOPY WITH ENDOBRONCHIAL NAVIGATION N/A 11/18/2015   Procedure: VIDEO BRONCHOSCOPY WITH ENDOBRONCHIAL NAVIGATION;  Surgeon: Melrose Nakayama, MD;  Location: [facility];  Service: Thoracic;  Laterality: N/A;  . VIDEO BRONCHOSCOPY WITH ENDOBRONCHIAL ULTRASOUND N/A 11/18/2015   Procedure: VIDEO BRONCHOSCOPY WITH ENDOBRONCHIAL ULTRASOUND;  Surgeon: Melrose Nakayama, MD;  Location: [facility];  Service: Thoracic;  Laterality: N/A;     reports that she quit smoking about 18 months ago. She has a 15.00 pack-year smoking history. she has never used smokeless tobacco. She reports that she does not drink alcohol or use drugs.  Allergies  Allergen Reactions  . Penicillins Swelling    SWELLING REACTION UNSPECIFIED  Has patient had a PCN reaction causing immediate rash, facial/tongue/throat swelling, SOB or lightheadedness with hypotension: Yes Has patient had a PCN reaction causing severe rash involving mucus membranes or skin necrosis: No Has patient had a PCN reaction that required hospitalization No Has patient had a PCN reaction occurring within the last 10 years: No If all of the above answers are "NO", then may proceed with Cephalosporin use.    Family History  Problem Relation Age of Onset  . Colon cancer Neg Hx   . Esophageal cancer Neg Hx   . Rectal cancer Neg Hx   . Stomach cancer Neg Hx    Family history reviewed.   Prior to Admission medications   Medication Sig Start Date End Date Taking? Authorizing Provider  aspirin 325 MG tablet Take 325 mg by mouth daily.    [provider]  blood glucose meter kit  and supplies KIT Dispense based on patient and insurance preference. Use up to four times daily as directed. (FOR ICD-9 250.00, 250.01). 04/11/17   Elodia Florence., MD  cetirizine (ZYRTEC) 10 MG tablet TAKE 1 TABLET(10 MG) BY MOUTH DAILY 03/04/17   Curt Bears, MD  Diclofenac Sodium 2 % SOLN Apply thin layer over affected areas on ribs 4 times daily as needed for pain. 04/29/17   [name], DO  dicyclomine (BENTYL) 10 MG capsule Take 1 capsule (10 mg total) by mouth 4 (four) times daily -  before meals and at bedtime. 03/21/17   [name], DO  ferrous sulfate 325 (65 FE) MG tablet Take 325 mg by mouth daily.    [provider]  Insulin Glargine (LANTUS) 100 UNIT/ML Solostar Pen Inject 6 Units into the skin daily at 10 pm. 04/15/17   Wendling, Crosby Oyster, DO  insulin lispro (HUMALOG) 100 UNIT/ML KiwkPen Inject 1 units before meals. 04/29/17   [name], DO  Insulin Pen Needle (B-D ULTRAFINE III SHORT PEN) 31G X 8 MM MISC USE AS DIRECTED WITH INSULIN 05/08/17   Wendling, Crosby Oyster, DO  levothyroxine (SYNTHROID, LEVOTHROID) 100 MCG tablet TAKE 1 TABLET(100 MCG) BY MOUTH DAILY BEFORE BREAKFAST 05/07/17   Curt Bears, MD  metFORMIN (GLUCOPHAGE) 500 MG tablet Take 2 tablets (1,000 mg total) by mouth 2 (two) times daily with a meal. (take as written in dc summary) 04/11/17 05/11/17  Elodia Florence., MD  mirtazapine (REMERON) 30 MG tablet Take 1 tablet (30 mg total) by mouth at bedtime. 04/29/17   [name], DO  Multiple Vitamin (MULTIVITAMIN) tablet Take 1 tablet by mouth daily.    [provider]    Physical Exam: Vitals:   05/08/17 1300 05/08/17 1330 05/08/17 1357 05/08/17 1400  BP: (!) 113/98 (!) 94/58 (!) 94/58 113/78  Pulse:   (!) 129   Resp: (!) 39 (!) 33 (!) 36 (!) 27  Weight:      Height:        Constitutional: delirious, though not in any distress, cachetic looking lady.  Vitals:   05/08/17 1300  05/08/17 1330 05/08/17 1357 05/08/17 1400  BP: (!) 113/98 (!) 94/58 (!) 94/58 113/78  Pulse:   (!) 129   Resp: (!) 39 (!) 33 (!) 36 (!) 27  Weight:      Height:       Eyes: PERRL, lids and conjunctivae normal ENMT: Mucous membranes are dry.  Neck: normal, supple, no masses, no thyromegaly Respiratory: clear to auscultation bilaterally, Cardiovascular: Regular rate and rhythm, no murmur No extremity edema. 2+ pedal pulses. No carotid bruits.  Abdomen: no tenderness, no masses palpated. No hepatosplenomegaly. Bowel sounds positive.  Musculoskeletal: no clubbing / cyanosis. No joint deformity upper and lower extremities.  Skin: no rashes, lesions, ulcers. No induration Neurologic: pt delirous and lethargic, confused, able to move all extremities, detailed neuro exam couldn't be done  Psychiatric: delirious.    Labs on Admission: I have personally reviewed following labs and  imaging studies  CBC: Recent Labs  Lab 05/06/17 1020 05/08/17 1108 05/08/17 1121  WBC 11.7* 19.5*  --   NEUTROABS 9.7* 17.7*  --   HGB 11.0* 12.1 13.6  HCT 32.4* 36.2 40.0  MCV 81.2 83.8  --   PLT 390 607*  --    Basic Metabolic Panel: Recent Labs  Lab 05/06/17 1020 05/08/17 1108 05/08/17 1121  NA 135* 138 137  K 3.3* 5.3* 5.1  CL 98 103 109  CO2 22 <7*  --   GLUCOSE 248* 688* 657*  BUN 7 21* 19  CREATININE 0.95 1.72* 1.20*  CALCIUM 9.3 9.9  --    GFR: Estimated Creatinine Clearance: 29.1 mL/min (A) (by C-G formula based on SCr of 1.2 mg/dL (H)). Liver Function Tests: Recent Labs  Lab 05/06/17 1020 05/08/17 1108  AST 12 17  ALT 7 11*  ALKPHOS 143 156*  BILITOT 0.5 1.6*  PROT 7.9 8.8*  ALBUMIN 2.7* 3.3*   Recent Labs  Lab 05/08/17 1108  LIPASE 56*   No results for input(s): AMMONIA in the last 168 hours. Coagulation Profile: No results for input(s): INR, PROTIME in the last 168 hours. Cardiac Enzymes: No results for input(s): CKTOTAL, CKMB, CKMBINDEX, TROPONINI in the last 168  hours. BNP (last 3 results) No results for input(s): PROBNP in the last 8760 hours. HbA1C: No results for input(s): HGBA1C in the last 72 hours. CBG: Recent Labs  Lab 05/08/17 1044 05/08/17 1312 05/08/17 1427  GLUCAP 530* 536* 492*   Lipid Profile: No results for input(s): CHOL, HDL, LDLCALC, TRIG, CHOLHDL, LDLDIRECT in the last 72 hours. Thyroid Function Tests: Recent Labs    05/06/17 1019  TSH 1.156   Anemia Panel: No results for input(s): VITAMINB12, FOLATE, FERRITIN, TIBC, IRON, RETICCTPCT in the last 72 hours. Urine analysis:    Component Value Date/Time   COLORURINE YELLOW 04/08/2017 1637   APPEARANCEUR CLEAR 04/08/2017 1637   LABSPEC 1.025 04/08/2017 1637   PHURINE 6.0 04/08/2017 1637   GLUCOSEU >=500 (A) 04/08/2017 1637   HGBUR TRACE (A) 04/08/2017 1637   BILIRUBINUR SMALL (A) 04/08/2017 1637   KETONESUR >80 (A) 04/08/2017 1637   PROTEINUR NEGATIVE 04/08/2017 1637   NITRITE NEGATIVE 04/08/2017 1637   LEUKOCYTESUR TRACE (A) 04/08/2017 1637    Radiological Exams on Admission: Dg Chest 2 View  Result Date: 05/08/2017 CLINICAL DATA:  Tachycardia.  Lung cancer. EXAM: CHEST - 2 VIEW COMPARISON:  04/08/2017 FINDINGS: COPD with emphysema. Asymmetric density overlying the right hilum compatible with tumor is noted on prior CT. Negative for pneumonia or effusion. Negative for heart failure. No acute skeletal abnormality. IMPRESSION: COPD Right lower lobe mass lesion and right hilar adenopathy, stable Electronically Signed   By: Franchot Gallo M.D.   On: 05/08/2017 11:48    EKG: Independently reviewed.   Assessment/Plan Active Problems:   Lung mass   Tobacco abuse   Adenocarcinoma of right lung, stage 4 (HCC)   HTN (hypertension)   Hypothyroidism (acquired)   Chronic fatigue   Diabetes mellitus type 2 in nonobese (HCC)   Acute encephalopathy   Pancreatitis   Malnutrition of moderate degree   Failure to thrive in adult   DKA (diabetic ketoacidoses) ([place])       Diabetic ketoacidosis:  Admit to step down for IV insulin and monitor cbg's/  Check BMP every 4 hours to evaluate the anion gap. Patient's last bicarb is less than 7. Will start her on bicarb drip. Potassium is 5.1.  hgba1c ordered and  pending.    Acute encephalopathy:  Suspect from the DKA. CT head without contrast will be ordered for further evaluation.   Sepsis, unknown source: On arrival, pt is tachycardic, tachypneic, hypotensive, elevated lactic acid and leukocytosis.  CXR does not show any pneumonia.  UA shows leukocytosis with rare bacteria.  abd is soft and non tender and no diarrhea reported. No ulcers found.  Trend lactic acid levels.  Blood cultures ordered and pending. Follow.  Start her on broad spectrum antibiotics with IV vancomycin and IV zosyn.    Moderate malnutrition:  Dietary consulted.    Hypothyroidism: Get TSH levels.    Hypertension:  Hypotensive on admission, bp parameters improved with IV fluid resuscitation.    Stage 4 metastatic non small cell lung ca:  Further management as per Dr [name].  Last chemo was on Monday.    Tobacco abuse;  Will add nicotine patch. Pt confused currently, when she is alert and awake, will need counseling.    Failure to thrive:  Dietary consult and will need [place] meeting with oncology.   Mild elevated lipase levels/ mild pancreatitis:  She does not report any abd pain.  Monitor levels.   DVT prophylaxis: lovenox.  Code Status: full code.  Family Communication: discussed the plan with daughter.  Disposition Plan: pending  Resolution of DKA Consults called: none.  Admission status: inpatient SDU.    Hosie Poisson MD [facility] Pager [phone]   If 7PM-7AM, please contact night-coverage [email or web address] Password [facility]  05/08/2017, 2:34 PM

## 2017-05-08 NOTE — ED Notes (Signed)
Unable to draw lactic acid. A call placed to Hospital Lab phlebotomist to draw blood.

## 2017-05-08 NOTE — ED Notes (Signed)
Patient transported to CT 

## 2017-05-08 NOTE — Progress Notes (Signed)
A consult was received from an ED physician for vancomycin and levaquin per pharmacy dosing.  The patient's profile has been reviewed for ht/wt/allergies/indication/available labs.   A one time order has been placed for vancomycin 1 gm and levaquin 750 mg .    Further antibiotics/pharmacy consults should be ordered by admitting physician if indicated.                       Thank you, Eudelia Bunch, Pharm.D. [phone] 05/08/2017 11:49 AM

## 2017-05-08 NOTE — ED Notes (Signed)
Bed: RESA Expected date:  Expected time:  Means of arrival:  Comments: EMS/cancer/weak

## 2017-05-08 NOTE — ED Notes (Signed)
Attempting to get a O2 reading on patient and it is not reading at all, I have switched fingers and hands, currently has it on patients toe and its is still not reading.

## 2017-05-08 NOTE — ED Notes (Signed)
Date and time results received: 05/08/17 1914 (use smartphrase ".now" to insert current time)  Test: Lactic Acid Critical Value: 3.1  Name of Provider Notified: Freda Munro, RN  Orders Received? Or Actions Taken?:

## 2017-05-08 NOTE — ED Notes (Signed)
Dr Leonette Monarch notified of patient' lab results.

## 2017-05-08 NOTE — ED Notes (Signed)
Main Lab was not able to get enough blood for BMP

## 2017-05-08 NOTE — ED Notes (Signed)
Date and time results received: 05/08/17 1212 (use smartphrase ".now" to insert current time)  Test: Glucose Critical Value: 688  Name of Provider Notified: Dr. Leonette Monarch  Orders Received? Or Actions Taken?:

## 2017-05-08 NOTE — ED Provider Notes (Signed)
[facility] DEPT Provider Note  CSN: [id number] Arrival date & time: 05/08/17 1029  Chief Complaint(s) Hyperglycemia and Failure To Thrive  HPI Brittany Mercado is a 67 y.o. 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  Patient reports that she has had associated nausea and vomiting with Keytruda in the past.  States that she is unable to tolerate any oral intake.  Denies any known suspicious food intake or sick contacts.  Denies any associated fevers or chills.  She is endorsing increased thirst.  She denies any chest pain or shortness of breath.  She denies any abdominal pain.  No urinary symptoms.  Patient called EMS who noted that patient's CBG was greater than 500.  HPI  Past Medical History Past Medical History:  Diagnosis Date  . Adenocarcinoma of right lung, stage 4 ([place]) 11/30/2015  . Complication of anesthesia    hard to wake up  . Diabetes mellitus type 2 in nonobese ([facility]CC)   . Eczema   . Encounter for antineoplastic chemotherapy 11/30/2015  . Heart murmur    told at age 30 but never has had any problems  . History of radiation therapy 01/26/16-02/07/16   right pelvis 30 Gy in 10 fractions  . HTN (hypertension) 02/01/2016  . 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 Iron deficiency anemia due to chronic blood loss 12/10/2016   Patient Active Problem List   Diagnosis Date Noted  . Failure to thrive in adult 04/29/2017  . Malnutrition of moderate degree 04/10/2017  . Acute encephalopathy 04/09/2017  . Community acquired pneumonia 04/09/2017  . Pancreatitis 04/09/2017  . Encephalopathy  acute 04/08/2017  . Diabetes mellitus type 2 in nonobese ([place]) 04/01/2017  . Hyperglycemia 03/25/2017  . Chest wall pain 03/21/2017  . 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 Chronic fatigue 12/24/2016  . Iron deficiency anemia due to chronic blood loss 12/10/2016  . 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 HTN (hypertension) 02/01/2016  . Encounter for antineoplastic immunotherapy 12/21/2015  . Adenocarcinoma of right lung, stage 4 ([place]) 11/30/2015  . Encounter for antineoplastic chemotherapy 11/30/2015  . Tobacco abuse 11/10/2015  . Lung mass 10/11/2015   Home Medication(s) Prior to Admission medications   Medication Sig Start Date End Date Taking? Authorizing Provider  aspirin 325 MG tablet Take 325 mg by mouth daily.    [provider]  blood glucose meter kit and supplies KIT Dispense based on patient and insurance preference. Use up to four times daily as directed. (FOR ICD-9 250.00, 250.01). 04/11/17   Elodia Florence., MD  cetirizine (ZYRTEC) 10 MG tablet TAKE 1 TABLET(10 MG) BY MOUTH DAILY 03/04/17   Curt Bears, MD  Diclofenac Sodium 2 % SOLN Apply thin layer over affected areas on ribs 4 times daily as needed for pain. 04/29/17   [name], DO  dicyclomine (BENTYL) 10 MG capsule Take 1 capsule (10 mg total) by mouth 4 (four) times daily -  before meals and at bedtime. 03/21/17   [name], DO  ferrous sulfate 325 (65 FE) MG tablet Take 325 mg by mouth daily.    [provider]  Insulin Glargine (LANTUS) 100 UNIT/ML Solostar Pen Inject 6 Units into the  skin daily at 10 pm. 04/15/17   Wendling, Crosby Oyster, DO  insulin lispro (HUMALOG) 100 UNIT/ML KiwkPen Inject 1 units before meals. 04/29/17   [name], DO  Insulin Pen Needle (B-D ULTRAFINE III SHORT PEN) 31G X 8 MM MISC USE AS DIRECTED WITH INSULIN 05/08/17   Wendling, Crosby Oyster, DO  levothyroxine (SYNTHROID, LEVOTHROID) 100 MCG tablet TAKE 1 TABLET(100 MCG) BY MOUTH DAILY  BEFORE BREAKFAST 05/07/17   Curt Bears, MD  metFORMIN (GLUCOPHAGE) 500 MG tablet Take 2 tablets (1,000 mg total) by mouth 2 (two) times daily with a meal. (take as written in dc summary) 04/11/17 05/11/17  Elodia Florence., MD  mirtazapine (REMERON) 30 MG tablet Take 1 tablet (30 mg total) by mouth at bedtime. 04/29/17   [name], DO  Multiple Vitamin (MULTIVITAMIN) tablet Take 1 tablet by mouth daily.    [provider]                                                                                                                                    Past Surgical History Past Surgical History:  Procedure Laterality Date  . ABDOMINAL HYSTERECTOMY    . OOPHORECTOMY  1996   tumor removed from right ovary  . TONSILLECTOMY    . VIDEO BRONCHOSCOPY WITH ENDOBRONCHIAL NAVIGATION N/A 11/18/2015   Procedure: VIDEO BRONCHOSCOPY WITH ENDOBRONCHIAL NAVIGATION;  Surgeon: Melrose Nakayama, MD;  Location: [facility];  Service: Thoracic;  Laterality: N/A;  . VIDEO BRONCHOSCOPY WITH ENDOBRONCHIAL ULTRASOUND N/A 11/18/2015   Procedure: VIDEO BRONCHOSCOPY WITH ENDOBRONCHIAL ULTRASOUND;  Surgeon: Melrose Nakayama, MD;  Location: [facility] OR;  Service: Thoracic;  Laterality: N/A;   Family History Family History  Problem Relation Age of Onset  . Colon cancer Neg Hx   . Esophageal cancer Neg Hx   . Rectal cancer Neg Hx   . Stomach cancer Neg Hx     Social History Social History   Tobacco Use  . Smoking status: Former Smoker    Packs/day: 0.50    Years: 30.00    Pack years: 15.00    Last attempt to quit: 11/01/2015    Years since quitting: 1.5  . Smokeless tobacco: Never Used  Substance Use Topics  . Alcohol use: No  . Drug use: No   Allergies Penicillins  Review of Systems Review of Systems All other systems are reviewed and are negative for acute change except as noted in the HPI  Physical Exam Vital Signs  I have reviewed the triage vital signs BP 113/78   Pulse  (!) 129   Resp (!) 27   Ht _0  (1.6 m)   Wt 39.5 kg (87 lb)   BMI 15.41 kg/m   Physical Exam  Constitutional: She is oriented to person, place, and time. She appears well-developed. She appears cachectic. She has a sickly appearance. No distress.  HENT:  Head: Normocephalic and atraumatic.  Nose: Nose normal.  Eyes: Conjunctivae and EOM are normal. Pupils are equal, round, and reactive to light. Right eye exhibits no discharge. Left eye exhibits no discharge. No scleral icterus.  Neck: Normal range of motion. Neck supple.  Cardiovascular: Normal rate and regular rhythm. Exam reveals no gallop and no friction rub.  No murmur heard. Pulmonary/Chest: Effort normal and breath sounds normal. No stridor. No respiratory distress. She has no rales.  Abdominal: Soft. She exhibits no distension. There is no tenderness.  Musculoskeletal: She exhibits no edema or tenderness.  Neurological: She is alert and oriented to person, place, and time.  Skin: Skin is warm and dry. No rash noted. She is not diaphoretic. No erythema.  Psychiatric: She has a normal mood and affect.  Vitals reviewed.   ED Results and Treatments Labs (all labs ordered are listed, but only abnormal results are displayed) Labs Reviewed - No data to display                                                                                                                       EKG  EKG Interpretation  Date/Time:  Wednesday May 08 2017 10:59:50 EDT Ventricular Rate:  147 PR Interval:    QRS Duration: 103 QT Interval:  282 QTC Calculation: 441 R Axis:   61 Text Interpretation:  Sinus tachycardia Posterior infarct, old Repolarization abnormality, prob rate related Baseline wander in lead(s) III aVF Otherwise no significant change Confirmed by Addison Lank [phone]) on 05/08/2017 3:44:41 PM      Radiology Dg Chest 2 View  Result Date: 05/08/2017 CLINICAL DATA:  Tachycardia.  Lung cancer. EXAM: CHEST - 2 VIEW COMPARISON:   04/08/2017 FINDINGS: COPD with emphysema. Asymmetric density overlying the right hilum compatible with tumor is noted on prior CT. Negative for pneumonia or effusion. Negative for heart failure. No acute skeletal abnormality. IMPRESSION: COPD Right lower lobe mass lesion and right hilar adenopathy, stable Electronically Signed   By: Franchot Gallo M.D.   On: 05/08/2017 11:48   Pertinent labs & imaging results that were available during my care of the patient were reviewed by me and considered in my medical decision making (see chart for details).  Medications Ordered in ED Medications  sodium chloride 0.9 % bolus 1,000 mL (0 mLs Intravenous Stopped 05/08/17 1302)    And  sodium chloride 0.9 % bolus 1,000 mL (0 mLs Intravenous Stopped 05/08/17 1302)    And  0.9 %  sodium chloride infusion (125 mL/hr Intravenous New Bag/Given 05/08/17 1308)  insulin regular (NOVOLIN R,HUMULIN R) 100 Units in sodium chloride 0.9 % 100 mL (1 Units/mL) infusion (4.8 Units/hr Intravenous New Bag/Given 05/08/17 1309)  dextrose 5 %-0.45 % sodium chloride infusion (not administered)  ondansetron (ZOFRAN) injection 4 mg (4 mg Intravenous Given 05/08/17 1151)  levofloxacin (LEVAQUIN) IVPB 750 mg (0 mg Intravenous Stopped 05/08/17 1421)  vancomycin (VANCOCIN) IVPB 1000 mg/200 mL premix (1,000 mg Intravenous New Bag/Given 05/08/17 1418)  Procedures Procedures CRITICAL CARE Performed by: Grayce Sessions Brylie Sneath Total critical care time: 45 minutes Critical care time was exclusive of separately billable procedures and treating other patients. Critical care was necessary to treat or prevent imminent or life-threatening deterioration. Critical care was time spent personally by me on the following activities: development of treatment plan with patient and/or surrogate as well as nursing, discussions with  consultants, evaluation of patient's response to treatment, examination of patient, obtaining history from patient or surrogate, ordering and performing treatments and interventions, ordering and review of laboratory studies, ordering and review of radiographic studies, pulse oximetry and re-evaluation of patient's condition.   (including critical care time)  Medical Decision Making / ED Course I have reviewed the nursing notes for this encounter and the patient's prior records (if available in EHR or on provided paperwork).  Clinical Course as of May 08 1341  Wed May 08, 2017  1045 On review of systems patient was admitted 1 month ago for similar presentation and found to have elevated lipase concerning for mild pancreatitis, also noted to have community-acquired pneumonia that was treated with antibiotics.  Patient also noted to have elevated blood sugars without evidence of diabetic ketoacidosis.  Patient was discharged on 10 units of Lantus plus metformin.   At this time patient is afebrile, tachycardic with stable blood pressures.  She is sickly appearing with evidence of dehydration and croup small breathing.  CBG greater than 500.  DKA workup obtained.  We will also obtain infectious workup including chest x-ray given recent history of pneumonia.  Will provide patient with 2 L of IV fluids at this time while labs are pending.   [PC]  5374 I-STAT Chem-8 consistent with diabetic ketoacidosis.  Patient started on insulin drip.  Also notable for elevated lactic acid greater than 4.  Possibly due to dehydration. However, she is now delirious (picking at her clothing). Given her immunosuppression and recent infection, will initiate a code sepsis and give patient empiric antibiotics until infection can be ruled out.  [PC]  8270 Formal chemistry panel and blood gas confirming diabetic ketoacidosis.  CBC with significant leukocytosis.  Patient also with acute kidney injury.  [PC]  1248 Will discuss  with medicine for admission to SDU  [PC]    Clinical Course User Index [PC] Ara Grandmaison, Grayce Sessions, MD         Final Clinical Impression(s) / ED Diagnoses Final diagnoses:  Tachycardia  Diabetic ketoacidosis without coma associated with type 2 diabetes mellitus [facility])  Disorientation      This chart was dictated using voice recognition software.  Despite best efforts to proofread,  errors can occur which can change the documentation meaning.   Fatima Blank, MD 05/08/17 1544

## 2017-05-08 NOTE — ED Triage Notes (Signed)
Per EMS- Patient has Stage IV lung Cancer and family reports that the patient has not been eating or drinking. CBG at home was 520 this AM. EMS did not obtain a CBG.

## 2017-05-08 NOTE — Progress Notes (Addendum)
Pharmacy Antibiotic Note  Brittany Mercado is a 67 y.o. female admitted on 05/08/2017 with sepsis, DKA and failure to thrive.  PMH significant for metastatic lung cancer, DM, HTN.  In the ED patient received Vancomycin 1gm IV and Levaquin 750mg  IV x 1 dose.  Upon admission, pharmacy has been consulted for Vancomycin and Levaquin dosing.  Plan: Levaquin 750mg  IV q48h (adjusted for CrCl 20-49 ml/min) Vancomycin 750mg  IV q48h (Goal AUC 400-500) Follow renal function and adjust dosing as needed Follow culture results & sensitivities  Height: 5\' 3"  (160 cm) Weight: 87 lb (39.5 kg) IBW/kg (Calculated) : 52.4  No data recorded.  Recent Labs  Lab 05/06/17 1020 05/08/17 1108 05/08/17 1121 05/08/17 1539 05/08/17 1839  WBC 11.7* 19.5*  --   --   --   CREATININE 0.95 1.72* 1.20*  --   --   LATICACIDVEN  --   --  4.83* 2.67* 3.1*    Estimated Creatinine Clearance: 29.1 mL/min (A) (by C-G formula based on SCr of 1.2 mg/dL (H)).    Allergies  Allergen Reactions  . Penicillins Swelling    SWELLING REACTION UNSPECIFIED  Has patient had a PCN reaction causing immediate rash, facial/tongue/throat swelling, SOB or lightheadedness with hypotension: Yes Has patient had a PCN reaction causing severe rash involving mucus membranes or skin necrosis: No Has patient had a PCN reaction that required hospitalization No Has patient had a PCN reaction occurring within the last 10 years: No If all of the above answers are "NO", then may proceed with Cephalosporin use.    Antimicrobials this admission: 3/13 Vanc >>   3/13 Levaquin >>    Dose adjustments this admission:    Microbiology results: 3/13 BCx: sent  Thank you for allowing pharmacy to be a part of this patient's care.  Everette Rank, PharmD 05/08/2017 7:18 PM

## 2017-05-09 DIAGNOSIS — E876 Hypokalemia: Secondary | ICD-10-CM

## 2017-05-09 DIAGNOSIS — E872 Acidosis, unspecified: Secondary | ICD-10-CM

## 2017-05-09 DIAGNOSIS — R571 Hypovolemic shock: Secondary | ICD-10-CM | POA: Insufficient documentation

## 2017-05-09 DIAGNOSIS — L8990 Pressure ulcer of unspecified site, unspecified stage: Secondary | ICD-10-CM | POA: Insufficient documentation

## 2017-05-09 DIAGNOSIS — E119 Type 2 diabetes mellitus without complications: Secondary | ICD-10-CM

## 2017-05-09 DIAGNOSIS — R6510 Systemic inflammatory response syndrome (SIRS) of non-infectious origin without acute organ dysfunction: Secondary | ICD-10-CM

## 2017-05-09 LAB — CBC WITH DIFFERENTIAL/PLATELET
BASOS ABS: 0 10*3/uL (ref 0.0–0.1)
BASOS PCT: 0 %
EOS ABS: 0 10*3/uL (ref 0.0–0.7)
EOS PCT: 0 %
HCT: 29 % — ABNORMAL LOW (ref 36.0–46.0)
Hemoglobin: 10.4 g/dL — ABNORMAL LOW (ref 12.0–15.0)
Lymphocytes Relative: 9 %
Lymphs Abs: 1.1 10*3/uL (ref 0.7–4.0)
MCH: 28.5 pg (ref 26.0–34.0)
MCHC: 35.9 g/dL (ref 30.0–36.0)
MCV: 79.5 fL (ref 78.0–100.0)
MONO ABS: 1.1 10*3/uL — AB (ref 0.1–1.0)
Monocytes Relative: 9 %
Neutro Abs: 10.8 10*3/uL — ABNORMAL HIGH (ref 1.7–7.7)
Neutrophils Relative %: 82 %
PLATELETS: 378 10*3/uL (ref 150–400)
RBC: 3.65 MIL/uL — ABNORMAL LOW (ref 3.87–5.11)
RDW: 14.2 % (ref 11.5–15.5)
WBC: 13.1 10*3/uL — AB (ref 4.0–10.5)

## 2017-05-09 LAB — CBG MONITORING, ED
GLUCOSE-CAPILLARY: 215 mg/dL — AB (ref 65–99)
GLUCOSE-CAPILLARY: 163 mg/dL — AB (ref 65–99)
GLUCOSE-CAPILLARY: 86 mg/dL (ref 65–99)
Glucose-Capillary: 118 mg/dL — ABNORMAL HIGH (ref 65–99)
Glucose-Capillary: 211 mg/dL — ABNORMAL HIGH (ref 65–99)

## 2017-05-09 LAB — GLUCOSE, CAPILLARY
GLUCOSE-CAPILLARY: 113 mg/dL — AB (ref 65–99)
GLUCOSE-CAPILLARY: 136 mg/dL — AB (ref 65–99)
GLUCOSE-CAPILLARY: 199 mg/dL — AB (ref 65–99)
GLUCOSE-CAPILLARY: 202 mg/dL — AB (ref 65–99)
Glucose-Capillary: 135 mg/dL — ABNORMAL HIGH (ref 65–99)

## 2017-05-09 LAB — CBC
HCT: 25 % — ABNORMAL LOW (ref 36.0–46.0)
Hemoglobin: 8.8 g/dL — ABNORMAL LOW (ref 12.0–15.0)
MCH: 28 pg (ref 26.0–34.0)
MCHC: 35.2 g/dL (ref 30.0–36.0)
MCV: 79.6 fL (ref 78.0–100.0)
PLATELETS: 356 10*3/uL (ref 150–400)
RBC: 3.14 MIL/uL — AB (ref 3.87–5.11)
RDW: 14.1 % (ref 11.5–15.5)
WBC: 13.4 10*3/uL — ABNORMAL HIGH (ref 4.0–10.5)

## 2017-05-09 LAB — BASIC METABOLIC PANEL
Anion gap: 7 (ref 5–15)
Anion gap: 12 (ref 5–15)
Anion gap: 12 (ref 5–15)
BUN: 16 mg/dL (ref 6–20)
BUN: 14 mg/dL (ref 6–20)
BUN: 12 mg/dL (ref 6–20)
CALCIUM: 8.5 mg/dL — AB (ref 8.9–10.3)
CALCIUM: 8.5 mg/dL — AB (ref 8.9–10.3)
CHLORIDE: 119 mmol/L — AB (ref 101–111)
CO2: 20 mmol/L — ABNORMAL LOW (ref 22–32)
CO2: 19 mmol/L — ABNORMAL LOW (ref 22–32)
CO2: 21 mmol/L — ABNORMAL LOW (ref 22–32)
CREATININE: 0.82 mg/dL (ref 0.44–1.00)
Calcium: 8.5 mg/dL — ABNORMAL LOW (ref 8.9–10.3)
Chloride: 111 mmol/L (ref 101–111)
Chloride: 110 mmol/L (ref 101–111)
Creatinine, Ser: 0.87 mg/dL (ref 0.44–1.00)
Creatinine, Ser: 0.93 mg/dL (ref 0.44–1.00)
Glucose, Bld: 142 mg/dL — ABNORMAL HIGH (ref 65–99)
Glucose, Bld: 147 mg/dL — ABNORMAL HIGH (ref 65–99)
Glucose, Bld: 221 mg/dL — ABNORMAL HIGH (ref 65–99)
Potassium: 3.2 mmol/L — ABNORMAL LOW (ref 3.5–5.1)
Potassium: 3.2 mmol/L — ABNORMAL LOW (ref 3.5–5.1)
Potassium: 3.2 mmol/L — ABNORMAL LOW (ref 3.5–5.1)
SODIUM: 146 mmol/L — AB (ref 135–145)
SODIUM: 142 mmol/L (ref 135–145)
SODIUM: 143 mmol/L (ref 135–145)

## 2017-05-09 LAB — HEMOGLOBIN A1C
HEMOGLOBIN A1C: 8.8 % — AB (ref 4.8–5.6)
MEAN PLASMA GLUCOSE: 205.86 mg/dL

## 2017-05-09 LAB — HIV ANTIBODY (ROUTINE TESTING W REFLEX): HIV Screen 4th Generation wRfx: NONREACTIVE

## 2017-05-09 LAB — MRSA PCR SCREENING: MRSA by PCR: NEGATIVE

## 2017-05-09 LAB — LACTIC ACID, PLASMA: Lactic Acid, Venous: 1.3 mmol/L (ref 0.5–1.9)

## 2017-05-09 MED ORDER — DEXTROSE-NACL 5-0.45 % IV SOLN
INTRAVENOUS | Status: DC
  Administered 2017-05-09: 04:00:00 via INTRAVENOUS

## 2017-05-09 MED ORDER — ACETAMINOPHEN 325 MG PO TABS
650.0000 mg | ORAL_TABLET | Freq: Four times a day (QID) | ORAL | Status: DC | PRN
  Filled 2017-05-09: qty 2

## 2017-05-09 MED ORDER — SODIUM CHLORIDE 0.9 % IV SOLN: INTRAVENOUS | Status: AC

## 2017-05-09 MED ORDER — ACETAMINOPHEN 650 MG RE SUPP: 650.0000 mg | Freq: Four times a day (QID) | RECTAL | Status: DC | PRN

## 2017-05-09 MED ORDER — ONDANSETRON HCL 4 MG PO TABS: 4.0000 mg | ORAL_TABLET | Freq: Four times a day (QID) | ORAL | Status: DC | PRN

## 2017-05-09 MED ORDER — ONDANSETRON HCL 4 MG/2ML IJ SOLN
4.0000 mg | Freq: Four times a day (QID) | INTRAMUSCULAR | Status: DC | PRN
  Administered 2017-05-09: 4 mg via INTRAVENOUS
  Filled 2017-05-09 (×2): qty 2

## 2017-05-09 MED ORDER — PREMIER PROTEIN SHAKE
11.0000 [oz_av] | ORAL | Status: DC
  Administered 2017-05-09: 11 [oz_av] via ORAL
  Filled 2017-05-09 (×2): qty 325.31

## 2017-05-09 MED ORDER — SODIUM CHLORIDE 0.9 % IV SOLN: INTRAVENOUS | Status: DC

## 2017-05-09 MED ORDER — ENOXAPARIN SODIUM 30 MG/0.3ML ~~LOC~~ SOLN
30.0000 mg | SUBCUTANEOUS | Status: DC
  Administered 2017-05-09 – 2017-05-10 (×2): 30 mg via SUBCUTANEOUS
  Filled 2017-05-09 (×3): qty 0.3

## 2017-05-09 MED ORDER — INSULIN ASPART 100 UNIT/ML ~~LOC~~ SOLN
0.0000 [IU] | Freq: Every day | SUBCUTANEOUS | Status: DC
  Administered 2017-05-09: 0 [IU] via SUBCUTANEOUS

## 2017-05-09 MED ORDER — INSULIN ASPART 100 UNIT/ML ~~LOC~~ SOLN
3.0000 [IU] | Freq: Three times a day (TID) | SUBCUTANEOUS | Status: DC
  Administered 2017-05-10: 3 [IU] via SUBCUTANEOUS

## 2017-05-09 MED ORDER — LEVOTHYROXINE SODIUM 100 MCG PO TABS
100.0000 ug | ORAL_TABLET | Freq: Every day | ORAL | Status: DC
  Administered 2017-05-10: 100 ug via ORAL
  Filled 2017-05-09: qty 1

## 2017-05-09 MED ORDER — ENSURE ENLIVE PO LIQD
237.0000 mL | Freq: Two times a day (BID) | ORAL | Status: DC
  Administered 2017-05-09: 237 mL via ORAL

## 2017-05-09 MED ORDER — INSULIN ASPART 100 UNIT/ML ~~LOC~~ SOLN
0.0000 [IU] | Freq: Three times a day (TID) | SUBCUTANEOUS | Status: DC
  Administered 2017-05-09: 3 [IU] via SUBCUTANEOUS
  Administered 2017-05-09: 2 [IU] via SUBCUTANEOUS
  Administered 2017-05-09: 3 [IU] via SUBCUTANEOUS
  Administered 2017-05-10: 2 [IU] via SUBCUTANEOUS

## 2017-05-09 MED ORDER — INSULIN GLARGINE 100 UNIT/ML ~~LOC~~ SOLN
5.0000 [IU] | Freq: Once | SUBCUTANEOUS | Status: AC
  Administered 2017-05-09: 5 [IU] via SUBCUTANEOUS
  Filled 2017-05-09: qty 0.05

## 2017-05-09 MED ORDER — ONDANSETRON HCL 4 MG PO TABS: 4.0000 mg | ORAL_TABLET | Freq: Three times a day (TID) | ORAL | Status: DC | PRN

## 2017-05-09 MED ORDER — TRAMADOL HCL 50 MG PO TABS
50.0000 mg | ORAL_TABLET | Freq: Two times a day (BID) | ORAL | Status: DC | PRN
  Administered 2017-05-09: 50 mg via ORAL
  Filled 2017-05-09: qty 1

## 2017-05-09 MED ORDER — INSULIN GLARGINE 100 UNIT/ML ~~LOC~~ SOLN
5.0000 [IU] | Freq: Two times a day (BID) | SUBCUTANEOUS | Status: DC
  Administered 2017-05-09 – 2017-05-10 (×2): 5 [IU] via SUBCUTANEOUS
  Filled 2017-05-09 (×4): qty 0.05

## 2017-05-09 MED ORDER — ADULT MULTIVITAMIN W/MINERALS CH
1.0000 | ORAL_TABLET | Freq: Every day | ORAL | Status: DC
  Administered 2017-05-09 – 2017-05-10 (×2): 1 via ORAL
  Filled 2017-05-09 (×2): qty 1

## 2017-05-09 MED ORDER — FAMOTIDINE 10 MG PO TABS
10.0000 mg | ORAL_TABLET | Freq: Every day | ORAL | Status: DC | PRN
  Filled 2017-05-09: qty 1

## 2017-05-09 NOTE — ED Notes (Signed)
ED TO INPATIENT HANDOFF REPORT  Name/Age/Gender Brittany Mercado 67 y.o. female  Code Status    Code Status Orders  (From admission, onward)        Start     Ordered   05/09/17 0219  Full code  Continuous     05/09/17 0218    Code Status History    Date Active Date Inactive Code Status Order ID Comments User Context   05/09/2017 02:18 05/09/2017 02:18 Full Code [id number]  Hosie Poisson, MD ED   04/09/2017 01:24 04/11/2017 18:55 Full Code [id number]  Norval Morton, MD Inpatient      Home/SNF/Other Home  Chief Complaint DKA  Level of Care/Admitting Diagnosis ED Disposition    ED Disposition Condition [facility]: [facility] [[id number]]  Level of Care: Stepdown [14]  Admit to SDU based on following criteria: Hemodynamic compromise or significant risk of instability:  Patient requiring short term acute titration and management of vasoactive drips, and invasive monitoring (i.e., CVP and Arterial line).  Diagnosis: DKA (diabetic ketoacidoses) [facility]) [[id number]]  Admitting Physician: Hosie Poisson [[id number]]  Attending Physician: Hosie Poisson [[id number]]  Estimated length of stay: past midnight tomorrow  Certification:: I certify this patient will need inpatient services for at least 2 midnights  PT Class (Do Not Modify): Inpatient [101]  PT Acc Code (Do Not Modify): Private [1]       Medical History Past Medical History:  Diagnosis Date  . Adenocarcinoma of right lung, stage 4 ([place]) 11/30/2015  . Complication of anesthesia    hard to wake up  . Diabetes mellitus type 2 in nonobese (HCC)   . Eczema   . Encounter for antineoplastic chemotherapy 11/30/2015  . Heart murmur    told at age 53 but never has had any problems  . History of radiation therapy 01/26/16-02/07/16   right pelvis 30 Gy in 10 fractions  . HTN (hypertension) 02/01/2016  . 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 Iron deficiency anemia due to chronic blood loss 12/10/2016     Allergies Allergies  Allergen Reactions  . Penicillins Swelling    SWELLING REACTION UNSPECIFIED  Has patient had a PCN reaction causing immediate rash, facial/tongue/throat swelling, SOB or lightheadedness with hypotension: Yes Has patient had a PCN reaction causing severe rash involving mucus membranes or skin necrosis: No Has patient had a PCN reaction that required hospitalization No Has patient had a PCN reaction occurring within the last 10 years: No If all of the above answers are "NO", then may proceed with Cephalosporin use.    IV Location/Drains/Wounds Patient Lines/Drains/Airways Status   Active Line/Drains/Airways    Name:   Placement date:   Placement time:   Site:   Days:   Peripheral IV 05/08/17 Left Antecubital   05/08/17    0116    Antecubital   1   Peripheral IV 05/08/17 Right Antecubital   05/08/17    1224    Antecubital   1          Labs/Imaging Results for orders placed or performed during the hospital encounter of 05/08/17 (from the past 48 hour(s))  CBG monitoring, ED     Status: Abnormal   Collection Time: 05/08/17 10:44 AM  Result Value Ref Range   Glucose-Capillary 530 (HH) 65 - 99 mg/dL  CBC with Differential (PNL)     Status: Abnormal   Collection Time: 05/08/17 11:08 AM  Result Value Ref Range   WBC 19.5 (H) 4.0 - 10.5  K/uL   RBC 4.32 3.87 - 5.11 MIL/uL   Hemoglobin 12.1 12.0 - 15.0 g/dL   HCT 36.2 36.0 - 46.0 %   MCV 83.8 78.0 - 100.0 fL   MCH 28.0 26.0 - 34.0 pg   MCHC 33.4 30.0 - 36.0 g/dL   RDW 14.6 11.5 - 15.5 %   Platelets 607 (H) 150 - 400 K/uL   Neutrophils Relative % 91 %   Lymphocytes Relative 5 %   Monocytes Relative 4 %   Eosinophils Relative 0 %   Basophils Relative 0 %   Neutro Abs 17.7 (H) 1.7 - 7.7 K/uL   Lymphs Abs 1.0 0.7 - 4.0 K/uL   Monocytes Absolute 0.8 0.1 - 1.0 K/uL   Eosinophils Absolute 0.0 0.0 - 0.7 K/uL   Basophils Absolute 0.0 0.0 - 0.1 K/uL   WBC Morphology WHITE COUNT CONFIRMED ON SMEAR     Comment:  Performed at [facility], [place] [address], [place], [place] [address]  Comprehensive metabolic panel     Status: Abnormal   Collection Time: 05/08/17 11:08 AM  Result Value Ref Range   Sodium 138 135 - 145 mmol/L   Potassium 5.3 (H) 3.5 - 5.1 mmol/L   Chloride 103 101 - 111 mmol/L   CO2 <7 (L) 22 - 32 mmol/L   Glucose, Bld 688 (HH) 65 - 99 mg/dL    Comment: CRITICAL RESULT CALLED TO, READ BACK BY AND VERIFIED WITH: T.SMITH RN [id number] A.QUIZON    BUN 21 (H) 6 - 20 mg/dL   Creatinine, Ser 1.72 (H) 0.44 - 1.00 mg/dL   Calcium 9.9 8.9 - 10.3 mg/dL   Total Protein 8.8 (H) 6.5 - 8.1 g/dL   Albumin 3.3 (L) 3.5 - 5.0 g/dL   AST 17 15 - 41 U/L   ALT 11 (L) 14 - 54 U/L   Alkaline Phosphatase 156 (H) 38 - 126 U/L   Total Bilirubin 1.6 (H) 0.3 - 1.2 mg/dL   GFR calc non Af Amer 30 (L) >60 mL/min   GFR calc Af Amer 35 (L) >60 mL/min    Comment: (NOTE) The eGFR has been calculated using the CKD EPI equation. This calculation has not been validated in all clinical situations. eGFR's persistently <60 mL/min signify possible Chronic Kidney Disease.    Anion gap NOT CALCULATED 5 - 15    Comment: Performed at [facility], [place] [address] [place], [place] [address]  Lipase, blood     Status: Abnormal   Collection Time: 05/08/17 11:08 AM  Result Value Ref Range   Lipase 56 (H) 11 - 51 U/L    Comment: Performed at [facility], [place] [address] [place], [place] [address]  Hemoglobin A1c     Status: Abnormal   Collection Time: 05/08/17 11:08 AM  Result Value Ref Range   Hgb A1c MFr Bld 8.8 (H) 4.8 - 5.6 %    Comment: (NOTE) Pre diabetes:          5.7%-6.4% Diabetes:              >6.4% Glycemic control for   <7.0% adults with diabetes    Mean Plasma Glucose 205.86 mg/dL    Comment: Performed at [place] [address] [place], [place] [address]  I-Stat Chem 8, ED     Status: Abnormal   Collection Time: 05/08/17 11:21 AM   Result Value Ref Range   Sodium 137 135 - 145 mmol/L  Potassium 5.1 3.5 - 5.1 mmol/L   Chloride 109 101 - 111 mmol/L   BUN 19 6 - 20 mg/dL   Creatinine, Ser 1.20 (H) 0.44 - 1.00 mg/dL   Glucose, Bld 657 (HH) 65 - 99 mg/dL   Calcium, Ion 1.29 1.15 - 1.40 mmol/L   TCO2 8 (L) 22 - 32 mmol/L   Hemoglobin 13.6 12.0 - 15.0 g/dL   HCT 40.0 36.0 - 46.0 %  I-Stat CG4 Lactic Acid, ED     Status: Abnormal   Collection Time: 05/08/17 11:21 AM  Result Value Ref Range   Lactic Acid, Venous 4.83 (HH) 0.5 - 1.9 mmol/L   Comment NOTIFIED PHYSICIAN   Blood gas, venous     Status: Abnormal   Collection Time: 05/08/17 11:26 AM  Result Value Ref Range   pH, Ven 6.969 (LL) 7.250 - 7.430    Comment: CRITICAL RESULT CALLED TO, READ BACK BY AND VERIFIED WITH: dr Shellee Milo cardema by Eben Burow rrt rcp on 05/08/17 at 1129    pCO2, Ven 20.3 (L) 44.0 - 60.0 mmHg   pO2, Ven 85.7 (H) 32.0 - 45.0 mmHg   Bicarbonate 4.4 (L) 20.0 - 28.0 mmol/L   Acid-Base Excess 28.0 (H) 0.0 - 2.0 mmol/L   Acid-base deficit 25.0 (H) 0.0 - 2.0 mmol/L   O2 Saturation 88.7 %   Patient temperature 98.6    Collection site VEIN    Drawn by [id number]    Sample type VENOUS     Comment: Performed at [facility], [place] [address] [place], [place] [address]  CBG monitoring, ED     Status: Abnormal   Collection Time: 05/08/17  1:12 PM  Result Value Ref Range   Glucose-Capillary 536 (HH) 65 - 99 mg/dL  CBG monitoring, ED     Status: Abnormal   Collection Time: 05/08/17  2:27 PM  Result Value Ref Range   Glucose-Capillary 492 (H) 65 - 99 mg/dL  Urinalysis, Routine w reflex microscopic     Status: Abnormal   Collection Time: 05/08/17  3:10 PM  Result Value Ref Range   Color, Urine STRAW (A) YELLOW   APPearance CLEAR CLEAR   Specific Gravity, Urine 1.014 1.005 - 1.030   pH 5.0 5.0 - 8.0   Glucose, UA >=500 (A) NEGATIVE mg/dL   Hgb urine dipstick MODERATE (A) NEGATIVE   Bilirubin Urine NEGATIVE NEGATIVE   Ketones,  ur 80 (A) NEGATIVE mg/dL   Protein, ur 30 (A) NEGATIVE mg/dL   Nitrite NEGATIVE NEGATIVE   Leukocytes, UA LARGE (A) NEGATIVE   RBC / HPF 0-5 0 - 5 RBC/hpf   WBC, UA 6-30 0 - 5 WBC/hpf   Bacteria, UA RARE (A) NONE SEEN   Squamous Epithelial / LPF 0-5 (A) NONE SEEN   Mucus PRESENT    Hyaline Casts, UA PRESENT     Comment: Performed at [facility], [place] [address] [place] [address], [place],  [address]  I-Stat CG4 Lactic Acid, ED     Status: Abnormal   Collection Time: 05/08/17  3:39 PM  Result Value Ref Range   Lactic Acid, Venous 2.67 (HH) 0.5 - 1.9 mmol/L   Comment NOTIFIED PHYSICIAN   CBG monitoring, ED     Status: Abnormal   Collection Time: 05/08/17  4:31 PM  Result Value Ref Range   Glucose-Capillary 424 (H) 65 - 99 mg/dL  CBG monitoring, ED     Status: Abnormal   Collection Time: 05/08/17  5:47 PM  Result Value  Ref Range   Glucose-Capillary 382 (H) 65 - 99 mg/dL  Lactic acid, plasma     Status: Abnormal   Collection Time: 05/08/17  6:39 PM  Result Value Ref Range   Lactic Acid, Venous 3.1 (HH) 0.5 - 1.9 mmol/L    Comment: CRITICAL RESULT CALLED TO, READ BACK BY AND VERIFIED WITH: TIM SMITH,RN [id number] @ 1911 BY J SCOTTON Performed at [place] [address], [place], [place] [address]   CBG monitoring, ED     Status: Abnormal   Collection Time: 05/08/17  6:53 PM  Result Value Ref Range   Glucose-Capillary 282 (H) 65 - 99 mg/dL  CBG monitoring, ED     Status: Abnormal   Collection Time: 05/08/17  8:03 PM  Result Value Ref Range   Glucose-Capillary 204 (H) 65 - 99 mg/dL  CBG monitoring, ED     Status: None   Collection Time: 05/08/17  9:09 PM  Result Value Ref Range   Glucose-Capillary 90 65 - 99 mg/dL  CBG monitoring, ED     Status: Abnormal   Collection Time: 05/08/17 10:33 PM  Result Value Ref Range   Glucose-Capillary 114 (H) 65 - 99 mg/dL  Basic metabolic panel     Status: Abnormal   Collection Time: 05/08/17 11:16 PM  Result  Value Ref Range   Sodium 148 (H) 135 - 145 mmol/L    Comment: DELTA CHECK NOTED REPEATED TO VERIFY    Potassium 4.1 3.5 - 5.1 mmol/L    Comment: DELTA CHECK NOTED REPEATED TO VERIFY    Chloride 122 (H) 101 - 111 mmol/L   CO2 15 (L) 22 - 32 mmol/L   Glucose, Bld 129 (H) 65 - 99 mg/dL   BUN 16 6 - 20 mg/dL   Creatinine, Ser 0.97 0.44 - 1.00 mg/dL   Calcium 8.3 (L) 8.9 - 10.3 mg/dL   GFR calc non Af Amer 60 (L) >60 mL/min   GFR calc Af Amer >60 >60 mL/min    Comment: (NOTE) The eGFR has been calculated using the CKD EPI equation. This calculation has not been validated in all clinical situations. eGFR's persistently <60 mL/min signify possible Chronic Kidney Disease.    Anion gap 11 5 - 15    Comment: Performed at [facility], [place] [address], [place], [place] [address]  CBG monitoring, ED     Status: Abnormal   Collection Time: 05/08/17 11:49 PM  Result Value Ref Range   Glucose-Capillary 140 (H) 65 - 99 mg/dL  CBG monitoring, ED     Status: Abnormal   Collection Time: 05/09/17 12:58 AM  Result Value Ref Range   Glucose-Capillary 211 (H) 65 - 99 mg/dL  CBG monitoring, ED     Status: Abnormal   Collection Time: 05/09/17  2:14 AM  Result Value Ref Range   Glucose-Capillary 215 (H) 65 - 99 mg/dL   Comment 1 Notify RN   CBG monitoring, ED     Status: Abnormal   Collection Time: 05/09/17  3:25 AM  Result Value Ref Range   Glucose-Capillary 163 (H) 65 - 99 mg/dL   Comment 1 Notify RN   Basic metabolic panel     Status: Abnormal   Collection Time: 05/09/17  4:09 AM  Result Value Ref Range   Sodium 146 (H) 135 - 145 mmol/L   Potassium 3.2 (L) 3.5 - 5.1 mmol/L    Comment: DELTA CHECK NOTED   Chloride 119 (H) 101 - 111 mmol/L  CO2 20 (L) 22 - 32 mmol/L   Glucose, Bld 142 (H) 65 - 99 mg/dL   BUN 16 6 - 20 mg/dL   Creatinine, Ser 0.87 0.44 - 1.00 mg/dL   Calcium 8.5 (L) 8.9 - 10.3 mg/dL   GFR calc non Af Amer >60 >60 mL/min   GFR calc Af Amer >60 >60  mL/min    Comment: (NOTE) The eGFR has been calculated using the CKD EPI equation. This calculation has not been validated in all clinical situations. eGFR's persistently <60 mL/min signify possible Chronic Kidney Disease.    Anion gap 7 5 - 15    Comment: Performed at [facility], [place] [address], [place], [place] [address]  CBG monitoring, ED     Status: Abnormal   Collection Time: 05/09/17  4:44 AM  Result Value Ref Range   Glucose-Capillary 118 (H) 65 - 99 mg/dL   Comment 1 Notify RN    Dg Chest 2 View  Result Date: 05/08/2017 CLINICAL DATA:  Tachycardia.  Lung cancer. EXAM: CHEST - 2 VIEW COMPARISON:  04/08/2017 FINDINGS: COPD with emphysema. Asymmetric density overlying the right hilum compatible with tumor is noted on prior CT. Negative for pneumonia or effusion. Negative for heart failure. No acute skeletal abnormality. IMPRESSION: COPD Right lower lobe mass lesion and right hilar adenopathy, stable Electronically Signed   By: Franchot Gallo M.D.   On: 05/08/2017 11:48   Ct Head Wo Contrast  Result Date: 05/08/2017 CLINICAL DATA:  Adenocarcinoma of the right lung post radiation chemotherapy. Two days of nausea, vomiting and diarrhea status post [place] infusion 3 days ago. EXAM: CT HEAD WITHOUT CONTRAST TECHNIQUE: Contiguous axial images were obtained from the base of the skull through the vertex without intravenous contrast. COMPARISON:  04/08/2017 FINDINGS: Brain: No evidence of acute large vascular territory infarct, intracranial hemorrhage, intra-axial mass or midline shift. No extra-axial fluid collections. Stable ventricles and sulci. 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 Midline fourth ventricle and basal cisterns without effacement. No hydrocephalus. Vascular: No hyperdense vessel sign. Skull: No suspicious osseous lesions. Sinuses/Orbits: No acute finding. Other: None IMPRESSION: Stable appearance of the brain without acute  intracranial abnormality. Chronic appearing small vessel ischemic disease. Electronically Signed   By: [place] Royalty M.D.   On: 05/08/2017 19:47    Pending Labs Unresulted Labs (From admission, onward)   Start     Ordered   05/15/17 0500  Creatinine, serum  (enoxaparin (LOVENOX)    CrCl >/= 30 ml/min)  Weekly,   R    Comments:  while on enoxaparin therapy    05/09/17 0218   05/09/17 0516  Lactic acid, plasma  STAT,   R     05/09/17 0515   05/09/17 0500  CBC  Tomorrow morning,   R     05/08/17 1346   05/09/17 0340  Basic metabolic panel  Now then every 4 hours,   R     05/09/17 0030   05/09/17 0219  HIV antibody (Routine Testing)  Once,   R     05/09/17 0218   05/09/17 3524  Basic metabolic panel  STAT Now then every 4 hours ,   STAT     05/09/17 0218   05/08/17 1347  Lactic acid, plasma  STAT Now then every 3 hours,   STAT     05/08/17 1346   05/08/17 1130  Blood Culture (routine x 2)  BLOOD CULTURE X 2,   STAT  05/08/17 1130      Vitals/Pain Today's Vitals   05/09/17 0330 05/09/17 0345 05/09/17 0400 05/09/17 0415  BP: (!) 136/52  (!) 137/53   Pulse: 96 93 95 95  Resp: (!) 31 (!) 27 (!) 26 (!) 31  SpO2: 97% 97% 95% 97%  Weight:      Height:        Isolation Precautions No active isolations  Medications Medications  sodium chloride 0.9 % bolus 1,000 mL (0 mLs Intravenous Stopped 05/08/17 1302)    And  sodium chloride 0.9 % bolus 1,000 mL (0 mLs Intravenous Stopped 05/08/17 1302)    And  0.9 %  sodium chloride infusion ( Intravenous Stopped 05/08/17 2115)  insulin regular (NOVOLIN R,HUMULIN R) 100 Units in sodium chloride 0.9 % 100 mL (1 Units/mL) infusion (3.1 Units/hr Intravenous Rate/Dose Change 05/09/17 0339)  dextrose 5 %-0.45 % sodium chloride infusion ( Intravenous Rate/Dose Change 05/08/17 2304)  enoxaparin (LOVENOX) injection 30 mg (not administered)  acetaminophen (TYLENOL) tablet 650 mg (not administered)    Or  acetaminophen (TYLENOL) suppository 650 mg (not  administered)  ondansetron (ZOFRAN) tablet 4 mg (not administered)    Or  ondansetron (ZOFRAN) injection 4 mg (not administered)  0.9 %  sodium chloride infusion ( Intravenous Not Given 05/09/17 0527)  dextrose 5 %-0.45 % sodium chloride infusion ( Intravenous New Bag/Given 05/09/17 0400)  0.9 %  sodium chloride infusion (not administered)  nicotine (NICODERM CQ - dosed in mg/24 hours) patch 14 mg (not administered)  sodium bicarbonate 100 mEq in sodium chloride 0.45 % 1,000 mL infusion ( Intravenous New Bag/Given 05/08/17 2020)  levofloxacin (LEVAQUIN) IVPB 750 mg (not administered)  vancomycin (VANCOCIN) IVPB 750 mg/150 ml premix (not administered)  insulin glargine (LANTUS) injection 5 Units (not administered)  ondansetron (ZOFRAN) injection 4 mg (4 mg Intravenous Given 05/08/17 1151)  levofloxacin (LEVAQUIN) IVPB 750 mg (0 mg Intravenous Stopped 05/08/17 1421)  vancomycin (VANCOCIN) IVPB 1000 mg/200 mL premix (0 mg Intravenous Stopped 05/08/17 1548)    Mobility walks

## 2017-05-09 NOTE — Progress Notes (Signed)
Initial Nutrition Assessment  DOCUMENTATION CODES:   Underweight(Will assess for malnutrition at follow-up)  INTERVENTION:  - Continue Ensure Enlive BID, each supplement provides 350 kcal and 20 grams of protein. - Will order Premier Protein once/day, this supplement provides 160 kcal and 30 grams of protein.  - Will order daily multivitamin with minerals.  - Continue to encourage PO intakes.   NUTRITION DIAGNOSIS:   Increased nutrient needs related to catabolic illness, cancer and cancer related treatments as evidenced by estimated needs.  GOAL:   Patient will meet greater than or equal to 90% of their needs  MONITOR:   PO intake, Supplement acceptance, Weight trends, Labs  REASON FOR ASSESSMENT:   Malnutrition Screening Tool, Consult Assessment of nutrition requirement/status  ASSESSMENT:   67 y.o. 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 status post chemotherapy last chemotherapy on 05/06/2017, recently diagnosed with diabetes mellitus type 2 steroid-induced was brought in for DKA and metabolic encephalopathy.  No intakes documented since admission. No family/visitors present at this time. Pt was last seen by an RD (this RD) on 04/09/17. At that time pt had poor appetite and newly dx DM. Education was provided to pt and son, who was at bedside, and handouts were provided.  Pt drowsy during visit today. She apologized for keeping her eyes shut throughout discussion. She reports since the last time RD saw her she had had poor appetite and was mainly consuming fruit, oatmeal, and large quantities of water. She was unable to think of any protein sources she was consuming in the past ~1 month. This RD had also discussed protein shake options with pt and son last visit. Pt reports "we did not have a chance to get any."   She requested NFPE not be done at this time and was interested in continuing to rest. She denied nausea, reported some gas pains  but no other abdominal pain. She reports that she has been having bland/lack of taste sensation and confirms last chemo was on 05/06/17.  Medications reviewed; sliding scale Novolog, 3 units Novolog TID, 5 units Lantus BID, 100 mcg oral Synthroid/day. Labs reviewed; CBGs: 86-215 mg/dL since 1:00 AM, K: 3.2 mmol/L, Ca: 8.5 mg/dL.     NUTRITION - FOCUSED PHYSICAL EXAM:  Unable to completed at this time per pt request; will attempt at follow-up.   Diet Order:  Diet Carb Modified Fluid consistency: Thin; Room service appropriate? Yes  EDUCATION NEEDS:   Not appropriate for education at this time  Skin:  Skin Assessment: Skin Integrity Issues: Skin Integrity Issues:: Stage II, Unstageable Stage II: coccyx Unstageable: full thickness R toe  Last BM:  3/13  Height:   Ht Readings from Last 1 Encounters:  05/08/17 5\' 3"  (1.6 m)    Weight:   Wt Readings from Last 1 Encounters:  05/08/17 87 lb (39.5 kg)    Ideal Body Weight:  52.27 kg  BMI:  Body mass index is 15.41 kg/m.  Estimated Nutritional Needs:   Kcal:  1580-1775 (40-45 kcal/kg)  Protein:  70-87 grams (1.8-2.2 grams/kg)  Fluid:  >/= 2 L/day      Jarome Matin, MS, RD, LDN, [facility] Inpatient Clinical Dietitian Pager # [phone] After hours/weekend pager # [phone]

## 2017-05-09 NOTE — Progress Notes (Signed)
Lovenox per Pharmacy for DVT Prophylaxis    Pharmacy has been consulted from dosing enoxaparin (lovenox) in this patient for DVT prophylaxis.  The pharmacist has reviewed pertinent labs (Hgb _12.1__; PLT_607__), patient weight (__39.5_kg) and renal function (CrCl_36__mL/min) and decided that enoxaparin _30_mg SQ Q24Hrs is appropriate for this patient.  The pharmacy department will sign off at this time.  Please reconsult pharmacy if status changes or for further issues.  Thank you  Cyndia Diver PharmD, BCPS  05/09/2017, 2:23 AM

## 2017-05-09 NOTE — ED Notes (Signed)
CBG is 215. RN,Emily made aware.

## 2017-05-09 NOTE — Progress Notes (Signed)
[facility] PROGRESS NOTE    Progress Note  Brittany Mercado  NIO:[id number] DOB: 07-Feb-1951 DOA: 05/08/2017 PCP: [name], DO     Brief Narrative:   Brittany Mercado is an 67 y.o. 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 Discontinue bicarbonate drip. Elevated lipase is not due to pancreatitis is likely due to DKA. Follow-up on blood cultures.  Twelve-lead EKG showed no signs of ischemia. Patient relates she has been taking her insulin religiously.  I have stressed the importance of this.  Acute kidney injury: Likely prerenal in etiology started on normal saline, now switched to D5 half-normal saline.  Sirs/lactic acidosis: Lactic acid elevated likely due to DKA. DC empiric antibiotics, check a CBC will follow up on blood cultures. Lactic acid has resolved with fluid hydration. Chest x-ray shows no infiltrates or chronic known lung mass, CT scan of the head showed no acute abnormalities, UA showed no signs of infection. Follow-up on blood cultures.  Adenocarcinoma of right lung, stage 4 ([place]): Follow-up with oncologist as an outpatient.  Tobacco abuse Counseling  Essential HTN (hypertension) To need to hold antihypertensive medication in the setting of service due to DKA.  Hypothyroidism (acquired) Continue Synthroid  Malnutrition of moderate degree Ensure 3 times daily.  Pressure injury of skin: Turn patient every 2 hours.   DVT prophylaxis:  lovenox Family Communication: None Disposition Plan/Barrier to D/C: Transfer to MedSurg once off insulin drip Code Status:     Code Status Orders  (From admission, onward)        Start     Ordered   05/09/17 0219  Full code  Continuous     05/09/17 0218    Code Status History    Date Active Date Inactive Code Status Order ID Comments User Context   05/09/2017 02:18 05/09/2017 02:18 Full Code [id number]  Hosie Poisson, MD ED   04/09/2017 01:24 04/11/2017 18:55 Full Code [id number]  Norval Morton, MD Inpatient        IV Access:    Peripheral IV   Procedures and diagnostic studies:   Dg Chest 2 View  Result Date: 05/08/2017 CLINICAL DATA:  Tachycardia.  Lung cancer. EXAM: CHEST - 2 VIEW COMPARISON:  04/08/2017 FINDINGS: COPD with emphysema. Asymmetric density overlying the right hilum compatible with tumor is noted on prior CT. Negative for pneumonia or effusion. Negative for heart failure. No acute skeletal abnormality. IMPRESSION: COPD Right lower lobe mass lesion and right hilar adenopathy, stable Electronically Signed   By: Franchot Gallo M.D.   On: 05/08/2017 11:48   Ct Head Wo Contrast  Result Date: 05/08/2017 CLINICAL DATA:  Adenocarcinoma of the right lung post radiation chemotherapy. Two days of nausea, vomiting and diarrhea status post [place] infusion 3 days ago. EXAM: CT HEAD WITHOUT CONTRAST TECHNIQUE: Contiguous axial images were obtained from the base of the skull through the vertex without intravenous contrast. COMPARISON:  04/08/2017 FINDINGS: Brain: No evidence of acute large vascular territory infarct, intracranial hemorrhage, intra-axial mass or midline shift. No extra-axial fluid collections. Stable ventricles and sulci. 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 Midline fourth ventricle and basal cisterns without effacement. No hydrocephalus. Vascular: No hyperdense vessel sign. Skull: No suspicious osseous lesions. Sinuses/Orbits: No  acute finding. Other: None IMPRESSION: Stable appearance of the brain without acute intracranial abnormality. Chronic appearing small vessel ischemic disease. Electronically Signed   By: Ashley Royalty M.D.   On: 05/08/2017 19:47     Medical Consultants:    None.  Anti-Infectives:   Single dose of IV Vanco and Levaquin  Subjective:    Brittany Mercado she relates she feels much better than yesterday, she is complaining of thirst and right side with cage pain  Objective:    Vitals:   05/09/17 0600 05/09/17 0615 05/09/17 0643 05/09/17 0700  BP: (!) 139/51  (!) 151/48 (!) 135/38  Pulse: 95 92  91  Resp: (!) 31 (!) 28  (!) 29  SpO2: 94% 98%  99%  Weight:      Height:        Intake/Output Summary (Last 24 hours) at 05/09/2017 0728 Last data filed at 05/09/2017 0542 Gross per 24 hour  Intake 2290 ml  Output 350 ml  Net 1940 ml   Filed Weights   05/08/17 1048  Weight: 39.5 kg (87 lb)    Exam: General exam: In no acute distress cachectic, poor oral hygiene Respiratory system: Good air movement and clear to auscultation. Cardiovascular system: S1 & S2 heard, RRR.  Strong heartbeat no appreciated JVD Gastrointestinal system: Abdomen is nondistended, soft and nontender.  Central nervous system: Alert and oriented. No focal neurological deficits. Extremities: No pedal edema. Skin: No rashes or ulceration. Psychiatry: Judgement and insight appear normal. Mood & affect appropriate.    Data Reviewed:    Labs: Basic Metabolic Panel: Recent Labs  Lab 05/06/17 1020 05/08/17 1108 05/08/17 1121 05/08/17 2316 05/09/17 0409  NA 135* 138 137 148* 146*  K 3.3* 5.3* 5.1 4.1 3.2*  CL 98 103 109 122* 119*  CO2 22 <7*  --  15* 20*  GLUCOSE 248* 688* 657* 129* 142*  BUN 7 21* 19 16 16   CREATININE 0.95 1.72* 1.20* 0.97 0.87  CALCIUM 9.3 9.9  --  8.3* 8.5*   GFR Estimated Creatinine Clearance: 40.2 mL/min (by C-G formula based on SCr of 0.87 mg/dL). Liver Function Tests: Recent  Labs  Lab 05/06/17 1020 05/08/17 1108  AST 12 17  ALT 7 11*  ALKPHOS 143 156*  BILITOT 0.5 1.6*  PROT 7.9 8.8*  ALBUMIN 2.7* 3.3*   Recent Labs  Lab 05/08/17 1108  LIPASE 56*   No results for input(s): AMMONIA in the last 168 hours. Coagulation profile No results for input(s): INR, PROTIME in the last 168 hours.  CBC: Recent Labs  Lab 05/06/17 1020 05/08/17 1108 05/08/17 1121  WBC 11.7* 19.5*  --   NEUTROABS 9.7* 17.7*  --   HGB 11.0* 12.1 13.6  HCT 32.4* 36.2 40.0  MCV 81.2 83.8  --   PLT 390 607*  --    Cardiac Enzymes: No results for input(s): CKTOTAL, CKMB, CKMBINDEX, TROPONINI in the last 168 hours. BNP (last 3 results) No results for input(s): PROBNP in the last 8760 hours. CBG: Recent Labs  Lab 05/09/17 0214 05/09/17 0325 05/09/17 0444 05/09/17 0552 05/09/17 0656  GLUCAP 215* 163* 118* 86 113*   D-Dimer: No results for input(s): DDIMER in the last 72 hours. Hgb A1c: Recent Labs    05/08/17 1108  HGBA1C 8.8*   Lipid Profile: No results for input(s):  CHOL, HDL, LDLCALC, TRIG, CHOLHDL, LDLDIRECT in the last 72 hours. Thyroid function studies: Recent Labs    05/06/17 1019  TSH 1.156   Anemia work up: No results for input(s): VITAMINB12, FOLATE, FERRITIN, TIBC, IRON, RETICCTPCT in the last 72 hours. Sepsis Labs: Recent Labs  Lab 05/06/17 1020 05/08/17 1108 05/08/17 1121 05/08/17 1539 05/08/17 1839 05/09/17 0538  WBC 11.7* 19.5*  --   --   --   --   LATICACIDVEN  --   --  4.83* 2.67* 3.1* 1.3   Microbiology No results found for this or any previous visit (from the past 240 hour(s)).   Medications:   . enoxaparin (LOVENOX) injection  30 mg Subcutaneous Q24H  . nicotine  14 mg Transdermal Daily   Continuous Infusions: . sodium chloride Stopped (05/08/17 2115)  . sodium chloride    . dextrose 5 % and 0.45% NaCl 125 mL/hr at 05/08/17 2304  . dextrose 5 % and 0.45% NaCl 100 mL/hr at 05/09/17 0400  . insulin (NOVOLIN-R) infusion  0.1 Units/hr (05/09/17 0700)  . [START ON 05/10/2017] levofloxacin (LEVAQUIN) IV    .  sodium bicarbonate  infusion 1000 mL 75 mL/hr at 05/08/17 2020  . [START ON 05/10/2017] vancomycin        LOS: 1 day   Charlynne Cousins  [facility] Pager [phone]  *Please refer to [email or web address], password [email or web address] to get updated schedule on who will round on this patient, as hospitalists switch teams weekly. If 7PM-7AM, please contact night-coverage at [email or web address], password [email or web address] for any overnight needs.  05/09/2017, 7:28 AM

## 2017-05-10 DIAGNOSIS — E43 Unspecified severe protein-calorie malnutrition: Secondary | ICD-10-CM

## 2017-05-10 LAB — BASIC METABOLIC PANEL
Anion gap: 8 (ref 5–15)
BUN: 6 mg/dL (ref 6–20)
CALCIUM: 8.1 mg/dL — AB (ref 8.9–10.3)
CO2: 26 mmol/L (ref 22–32)
Chloride: 108 mmol/L (ref 101–111)
Creatinine, Ser: 0.76 mg/dL (ref 0.44–1.00)
GFR calc Af Amer: >60 mL/min (ref >60)
Glucose, Bld: 105 mg/dL — ABNORMAL HIGH (ref 65–99)
POTASSIUM: 2.6 mmol/L — AB (ref 3.5–5.1)
Sodium: 142 mmol/L (ref 135–145)

## 2017-05-10 LAB — CBC WITH DIFFERENTIAL/PLATELET
Basophils Absolute: 0 10*3/uL (ref 0.0–0.1)
Basophils Relative: 0 %
EOS PCT: 1 %
Eosinophils Absolute: 0.1 10*3/uL (ref 0.0–0.7)
HCT: 26.7 % — ABNORMAL LOW (ref 36.0–46.0)
Hemoglobin: 9.4 g/dL — ABNORMAL LOW (ref 12.0–15.0)
LYMPHS PCT: 18 %
Lymphs Abs: 1.6 10*3/uL (ref 0.7–4.0)
MCH: 27.8 pg (ref 26.0–34.0)
MCHC: 35.2 g/dL (ref 30.0–36.0)
MCV: 79 fL (ref 78.0–100.0)
MONO ABS: 0.8 10*3/uL (ref 0.1–1.0)
MONOS PCT: 9 %
Neutro Abs: 6.4 10*3/uL (ref 1.7–7.7)
Neutrophils Relative %: 72 %
PLATELETS: 320 10*3/uL (ref 150–400)
RBC: 3.38 MIL/uL — ABNORMAL LOW (ref 3.87–5.11)
RDW: 14.3 % (ref 11.5–15.5)
WBC: 8.9 10*3/uL (ref 4.0–10.5)

## 2017-05-10 LAB — GLUCOSE, CAPILLARY
GLUCOSE-CAPILLARY: 82 mg/dL (ref 65–99)
Glucose-Capillary: 127 mg/dL — ABNORMAL HIGH (ref 65–99)

## 2017-05-10 MED ORDER — FERROUS SULFATE 325 (65 FE) MG PO TABS
325.0000 mg | ORAL_TABLET | Freq: Every day | ORAL | Status: DC
  Administered 2017-05-10: 325 mg via ORAL
  Filled 2017-05-10 (×2): qty 1

## 2017-05-10 MED ORDER — MAGNESIUM SULFATE 2 GM/50ML IV SOLN
2.0000 g | Freq: Once | INTRAVENOUS | Status: AC
  Administered 2017-05-10: 2 g via INTRAVENOUS
  Filled 2017-05-10: qty 50

## 2017-05-10 MED ORDER — METFORMIN HCL 500 MG PO TABS: 1000.0000 mg | ORAL_TABLET | Freq: Two times a day (BID) | ORAL | Status: DC

## 2017-05-10 MED ORDER — ASPIRIN 325 MG PO TABS
325.0000 mg | ORAL_TABLET | Freq: Every day | ORAL | Status: DC
  Administered 2017-05-10: 325 mg via ORAL
  Filled 2017-05-10: qty 1

## 2017-05-10 MED ORDER — POTASSIUM CHLORIDE CRYS ER 20 MEQ PO TBCR
40.0000 meq | EXTENDED_RELEASE_TABLET | Freq: Once | ORAL | Status: AC
  Administered 2017-05-10: 40 meq via ORAL
  Filled 2017-05-10: qty 2

## 2017-05-10 MED ORDER — MIRTAZAPINE 30 MG PO TABS: 30.0000 mg | ORAL_TABLET | Freq: Every day | ORAL | Status: DC

## 2017-05-10 MED ORDER — PREMIER PROTEIN SHAKE: 11.0000 [oz_av] | ORAL | 3 refills | Status: DC

## 2017-05-10 MED ORDER — POTASSIUM CHLORIDE CRYS ER 20 MEQ PO TBCR
40.0000 meq | EXTENDED_RELEASE_TABLET | Freq: Two times a day (BID) | ORAL | Status: AC
  Administered 2017-05-10 (×2): 40 meq via ORAL
  Filled 2017-05-10: qty 2

## 2017-05-10 NOTE — Discharge Summary (Signed)
Physician Discharge Summary  Brittany Mercado SAY:[id number] DOB: [birth year] DOA: 05/08/2017  PCP: [name], DO  Admit date: 05/08/2017 Discharge date: 05/10/2017  Admitted From: home Disposition:  Home  Recommendations for Outpatient Follow-up:  1. Follow up with PCP in 1-2 weeks 2. Please obtain BMP/CBC in one week  Home Health:No Equipment/Devices:Home  Discharge Condition:Stable CODE STATUS:Full Diet recommendation: Heart Healthy   Brief/Interim Summary: 67 y.o. 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 status post chemotherapy last chemotherapy on 05/06/2017, recently diagnosed with diabetes mellitus type 2 steroid-induced was brought in for DKA and metabolic encephalopathy    Discharge Diagnoses:  Active Problems:   Lung mass   Tobacco abuse   Adenocarcinoma of right lung, stage 4 (HCC)   HTN (hypertension)   Chronic fatigue   Diabetes mellitus type 2 in nonobese (HCC)   Acute encephalopathy   Severe protein-calorie malnutrition (HCC)   Failure to thrive in adult   DKA (diabetic ketoacidoses) (HCC)   AKI (acute kidney injury) ([place])   Pressure injury of skin   Hypokalemia   SIRS (systemic inflammatory response syndrome) (HCC)   Lactic acidosis  Acute encephalopathy due to DKA/Sirs: She was started on IV insulin drip and was fluid resuscitated once her DKA resolved her fluids were discontinued. She was switched back to her home dose insulin plus short acting with meals and her blood glucose remained stable. Her A1c is 8.8, I am unsure of the etiology but my suspicion is that she could be noncompliant. No changes were made to her insulin dose resume metformin as an outpatient.  Acute kidney injury: Prerenal in etiology resolved with IV fluid hydration.  Metabolic lactic acidosis: This is not due to sepsis is likely due to DKA was fluid hydration. Chest x-ray showed no infiltrate CT scan of the head showed no acute  abnormalities  Adenocarcinoma of the right lung stage IV: Follow-up with oncology as an outpatient.  Ongoing tobacco abuse: Counseling.  Essential hypertension: No changes were made to his medication continue current regimen at home.  Hypothyroidism: Continue Synthroid.  Severe protein caloric malnutrition: Continue supplements at home.   Discharge Instructions  Discharge Instructions    Diet - low sodium heart healthy   Complete by:  As directed    Increase activity slowly   Complete by:  As directed      Allergies as of 05/10/2017      Reactions   Penicillins Swelling   SWELLING REACTION UNSPECIFIED  Has patient had a PCN reaction causing immediate rash, facial/tongue/throat swelling, SOB or lightheadedness with hypotension: Yes Has patient had a PCN reaction causing severe rash involving mucus membranes or skin necrosis: No Has patient had a PCN reaction that required hospitalization No Has patient had a PCN reaction occurring within the last 10 years: No If all of the above answers are "NO", then may proceed with Cephalosporin use.      Medication List    TAKE these medications   aspirin 325 MG tablet Take 325 mg by mouth daily.   blood glucose meter kit and supplies Kit Dispense based on patient and insurance preference. Use up to four times daily as directed. (FOR ICD-9 250.00, 250.01).   cetirizine 10 MG tablet Commonly known as:  ZYRTEC TAKE 1 TABLET(10 MG) BY MOUTH DAILY   Diclofenac Sodium 2 % Soln Apply thin layer over affected areas on ribs 4 times daily as needed for pain.   dicyclomine 10 MG capsule Commonly known  as:  BENTYL Take 1 capsule (10 mg total) by mouth 4 (four) times daily -  before meals and at bedtime.   famotidine 10 MG chewable tablet Commonly known as:  PEPCID AC Chew 10 mg by mouth daily as needed for heartburn.   ferrous sulfate 325 (65 FE) MG tablet Take 325 mg by mouth daily.   glimepiride 2 MG tablet Commonly known  as:  AMARYL Take 2 mg by mouth daily with breakfast.   Insulin Glargine 100 UNIT/ML Solostar Pen Commonly known as:  LANTUS Inject 6 Units into the skin daily at 10 pm.   insulin lispro 100 UNIT/ML KiwkPen Commonly known as:  HUMALOG Inject 1 units before meals.   Insulin Pen Needle 31G X 8 MM Misc Commonly known as:  B-D ULTRAFINE III SHORT PEN USE AS DIRECTED WITH INSULIN   levothyroxine 100 MCG tablet Commonly known as:  SYNTHROID, LEVOTHROID TAKE 1 TABLET(100 MCG) BY MOUTH DAILY BEFORE BREAKFAST   metFORMIN 500 MG tablet Commonly known as:  GLUCOPHAGE Take 2 tablets (1,000 mg total) by mouth 2 (two) times daily with a meal. (take as written in dc summary)   mirtazapine 30 MG tablet Commonly known as:  REMERON Take 1 tablet (30 mg total) by mouth at bedtime.   multivitamin tablet Take 1 tablet by mouth daily.   ondansetron 4 MG tablet Commonly known as:  ZOFRAN Take 4 mg by mouth every 8 (eight) hours as needed for nausea or vomiting.   protein supplement shake Liqd Commonly known as:  PREMIER PROTEIN Take 325 mLs (11 oz total) by mouth daily.   traMADol 50 MG tablet Commonly known as:  ULTRAM Take by mouth every 12 (twelve) hours as needed for moderate pain.       Allergies  Allergen Reactions  . Penicillins Swelling    SWELLING REACTION UNSPECIFIED  Has patient had a PCN reaction causing immediate rash, facial/tongue/throat swelling, SOB or lightheadedness with hypotension: Yes Has patient had a PCN reaction causing severe rash involving mucus membranes or skin necrosis: No Has patient had a PCN reaction that required hospitalization No Has patient had a PCN reaction occurring within the last 10 years: No If all of the above answers are "NO", then may proceed with Cephalosporin use.    Consultations:  None   Procedures/Studies: Dg Chest 2 View  Result Date: 05/08/2017 CLINICAL DATA:  Tachycardia.  Lung cancer. EXAM: CHEST - 2 VIEW COMPARISON:   04/08/2017 FINDINGS: COPD with emphysema. Asymmetric density overlying the right hilum compatible with tumor is noted on prior CT. Negative for pneumonia or effusion. Negative for heart failure. No acute skeletal abnormality. IMPRESSION: COPD Right lower lobe mass lesion and right hilar adenopathy, stable Electronically Signed   By: Franchot Gallo M.D.   On: 05/08/2017 11:48   Ct Head Wo Contrast  Result Date: 05/08/2017 CLINICAL DATA:  Adenocarcinoma of the right lung post radiation chemotherapy. Two days of nausea, vomiting and diarrhea status post [place] infusion 3 days ago. EXAM: CT HEAD WITHOUT CONTRAST TECHNIQUE: Contiguous axial images were obtained from the base of the skull through the vertex without intravenous contrast. COMPARISON:  04/08/2017 FINDINGS: Brain: No evidence of acute large vascular territory infarct, intracranial hemorrhage, intra-axial mass or midline shift. No extra-axial fluid collections. Stable ventricles and sulci. 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 Midline fourth ventricle and basal cisterns without effacement. No hydrocephalus. Vascular: No hyperdense vessel sign. Skull: No suspicious osseous lesions. Sinuses/Orbits: No acute finding. Other: None  IMPRESSION: Stable appearance of the brain without acute intracranial abnormality. Chronic appearing small vessel ischemic disease. Electronically Signed   By: Ashley Royalty M.D.   On: 05/08/2017 19:47      Subjective: Relates she feels back to baseline, she has a persistent cough, which she relates she has had for several months.  But otherwise feels fine.  Discharge Exam: Vitals:   05/10/17 0212 05/10/17 0540  BP: (!) 119/57 (!) 119/55  Pulse: 93 87  Resp: 20 16  Temp: 97.8 F (36.6 C) 98 F (36.7 C)  SpO2: 99% 100%   Vitals:   05/09/17 1757 05/09/17 2219 05/10/17 0212 05/10/17 0540  BP: (!) 127/56 (!) 151/63 (!) 119/57 (!) 119/55  Pulse: 93 99 93 87  Resp: (!) _0 Temp: 98.3 F (36.8 C) 98.2 F (36.8 C) 97.8 F (36.6 C) 98 F (36.7 C)  TempSrc: Oral Oral Oral Oral  SpO2: 100% 100% 99% 100%  Weight:      Height:        General: Pt is alert, awake, not in acute distress, cachectic appearing Cardiovascular: RRR, S1/S2 . Respiratory: Good air movement and clear to auscultation, no appreciated wheezing or rhonchi's. Abdominal: Soft, NT, ND, bowel sounds + Extremities: no edema, no cyanosis    The results of significant diagnostics from this hospitalization (including imaging, microbiology, ancillary and laboratory) are listed below for reference.     Microbiology: Recent Results (from the past 240 hour(s))  Blood Culture (routine x 2)     Status: None (Preliminary result)   Collection Time: 05/08/17 11:53 AM  Result Value Ref Range Status   Specimen Description   Final    BLOOD RIGHT FOREARM Performed at [facility] [address], [place], [place] [address]    Special Requests   Final    BOTTLES DRAWN AEROBIC AND ANAEROBIC Blood Culture adequate volume Performed at [facility] [address] [place], [place] [address]    Culture   Final    NO GROWTH 2 DAYS Performed at [facility] [address] [place], [place] [address]    Report Status PENDING  Incomplete  Blood Culture (routine x 2)     Status: None (Preliminary result)   Collection Time: 05/08/17 12:04 PM  Result Value Ref Range Status   Specimen Description   Final    BLOOD LEFT ANTECUBITAL Performed at [facility] [address] [place], [place] [address]    Special Requests   Final    IN PEDIATRIC BOTTLE Blood Culture adequate volume Performed at [facility] [address], [place], [place] [address]    Culture   Final    NO GROWTH 2 DAYS Performed at [facility] [address] [place], [place] [address]    Report Status PENDING  Incomplete  MRSA PCR Screening      Status: None   Collection Time: 05/09/17  6:46 AM  Result Value Ref Range Status   MRSA by PCR NEGATIVE NEGATIVE Final    Comment:        The GeneXpert MRSA Assay (FDA approved for NASAL specimens only), is one component of a comprehensive MRSA colonization surveillance program. It is not intended to diagnose MRSA infection nor to guide or monitor treatment for MRSA infections. Performed at [facility], [place] [address], [place], [place] [address]      Labs: BNP (last 3 results) No  results for input(s): BNP in the last 8760 hours. Basic Metabolic Panel: Recent Labs  Lab 05/08/17 2316 05/09/17 0409 05/09/17 0800 05/09/17 1217 05/10/17 0354  NA 148* 146* 142 143 142  K 4.1 3.2* 3.2* 3.2* 2.6*  CL 122* 119* 111 110 108  CO2 15* 20* 19* 21* 26  GLUCOSE 129* 142* 147* 221* 105*  BUN _0 CREATININE 0.97 0.87 0.93 0.82 0.76  CALCIUM 8.3* 8.5* 8.5* 8.5* 8.1*   Liver Function Tests: Recent Labs  Lab 05/06/17 1020 05/08/17 1108  AST 12 17  ALT 7 11*  ALKPHOS 143 156*  BILITOT 0.5 1.6*  PROT 7.9 8.8*  ALBUMIN 2.7* 3.3*   Recent Labs  Lab 05/08/17 1108  LIPASE 56*   No results for input(s): AMMONIA in the last 168 hours. CBC: Recent Labs  Lab 05/06/17 1020 05/08/17 1108 05/08/17 1121 05/09/17 0409 05/09/17 0806 05/10/17 0354  WBC 11.7* 19.5*  --  13.4* 13.1* 8.9  NEUTROABS 9.7* 17.7*  --   --  10.8* 6.4  HGB 11.0* 12.1 13.6 8.8* 10.4* 9.4*  HCT 32.4* 36.2 40.0 25.0* 29.0* 26.7*  MCV 81.2 83.8  --  79.6 79.5 79.0  PLT 390 607*  --  356 378 320   Cardiac Enzymes: No results for input(s): CKTOTAL, CKMB, CKMBINDEX, TROPONINI in the last 168 hours. BNP: Invalid input(s): POCBNP CBG: Recent Labs  Lab 05/09/17 1203 05/09/17 1637 05/09/17 2213 05/10/17 0803 05/10/17 1135  GLUCAP 199* 202* 135* 127* 82   D-Dimer No results for input(s): DDIMER in the last 72 hours. Hgb A1c Recent Labs    05/08/17 1108 05/09/17 0806   HGBA1C 8.8* 8.8*   Lipid Profile No results for input(s): CHOL, HDL, LDLCALC, TRIG, CHOLHDL, LDLDIRECT in the last 72 hours. Thyroid function studies No results for input(s): TSH, T4TOTAL, T3FREE, THYROIDAB in the last 72 hours.  Invalid input(s): FREET3 Anemia work up No results for input(s): VITAMINB12, FOLATE, FERRITIN, TIBC, IRON, RETICCTPCT in the last 72 hours. Urinalysis    Component Value Date/Time   COLORURINE STRAW (A) 05/08/2017 1510   APPEARANCEUR CLEAR 05/08/2017 1510   LABSPEC 1.014 05/08/2017 1510   PHURINE 5.0 05/08/2017 1510   GLUCOSEU >=500 (A) 05/08/2017 1510   HGBUR MODERATE (A) 05/08/2017 1510   BILIRUBINUR NEGATIVE 05/08/2017 1510   KETONESUR 80 (A) 05/08/2017 1510   PROTEINUR 30 (A) 05/08/2017 1510   NITRITE NEGATIVE 05/08/2017 1510   LEUKOCYTESUR LARGE (A) 05/08/2017 1510   Sepsis Labs Invalid input(s): PROCALCITONIN,  WBC,  LACTICIDVEN Microbiology Recent Results (from the past 240 hour(s))  Blood Culture (routine x 2)     Status: None (Preliminary result)   Collection Time: 05/08/17 11:53 AM  Result Value Ref Range Status   Specimen Description   Final    BLOOD RIGHT FOREARM Performed at [facility], [place] [address] [place], [place] [address]    Special Requests   Final    BOTTLES DRAWN AEROBIC AND ANAEROBIC Blood Culture adequate volume Performed at [facility] [address], [place], [place] [address]    Culture   Final    NO GROWTH 2 DAYS Performed at [facility] [address] [place] [address] [place],  [address]    Report Status PENDING  Incomplete  Blood Culture (routine x 2)     Status: None (Preliminary result)   Collection Time: 05/08/17 12:04 PM  Result Value Ref Range Status   Specimen Description   Final  BLOOD LEFT ANTECUBITAL Performed at [facility] [address], [place], [place] [address]    Special Requests   Final    IN PEDIATRIC BOTTLE Blood Culture  adequate volume Performed at [facility] [address], [place], [place] [address]    Culture   Final    NO GROWTH 2 DAYS Performed at [facility] [address] [place], [place] [address]    Report Status PENDING  Incomplete  MRSA PCR Screening     Status: None   Collection Time: 05/09/17  6:46 AM  Result Value Ref Range Status   MRSA by PCR NEGATIVE NEGATIVE Final    Comment:        The GeneXpert MRSA Assay (FDA approved for NASAL specimens only), is one component of a comprehensive MRSA colonization surveillance program. It is not intended to diagnose MRSA infection nor to guide or monitor treatment for MRSA infections. Performed at [facility], [place] [address], [place], [place] [address]      Time coordinating discharge: Over 30 minutes  SIGNED:   Charlynne Cousins, MD  [facility] 05/10/2017, 12:05 PM Pager   If 7PM-7AM, please contact night-coverage [email or web address] Password [email or web address]

## 2017-05-10 NOTE — Evaluation (Signed)
Physical Therapy Evaluation Patient Details Name: Brittany Mercado MRN: [id number] DOB: 02/25/1951 Today's Date: 05/10/2017   History of Present Illness   67 y.o. 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 status post chemotherapy last chemotherapy on 05/06/2017, recently diagnosed with diabetes mellitus type 2 steroid-induced was brought in for DKA and metabolic encephalopathy  Clinical Impression  Pt admitted with above diagnosis. Pt currently with functional limitations due to the deficits listed below (see PT Problem List). Pt is independent with bed mobility and transfers. Supervision to min/guard assist for ambulation due to mild unsteadiness. Pt had loss of balance x 3 while ambulating 110', she was able to self correct, she reports this is baseline and that she's not had any falls. She declined an assistive device.  Pt will benefit from skilled PT to increase their independence and safety with mobility during acute stay to allow discharge to the venue listed below.       Follow Up Recommendations Home health PT    Equipment Recommendations  Other (comment)(cane recommended, however pt declined)    Recommendations for Other Services       Precautions / Restrictions Precautions Precautions: Fall Precaution Comments: pt reports h/o unsteady gait but denies falls  Restrictions Weight Bearing Restrictions: No      Mobility  Bed Mobility Overal bed mobility: Independent                Transfers Overall transfer level: Independent                  Ambulation/Gait Ambulation/Gait assistance: Supervision;Min guard Ambulation Distance (Feet): 110 Feet Assistive device: None Gait Pattern/deviations: Step-through pattern;Scissoring;Staggering left;Staggering right   Gait velocity interpretation: at or above normal speed for age/gender General Gait Details: mild LOB x 3 due to scissoring/staggering, pt able to self correct, pt reports  this is baseline, that she's not had any falls, and she is not interested in an assistive device  Stairs            Wheelchair Mobility    Modified Rankin (Stroke Patients Only)       Balance Overall balance assessment: Needs assistance   Sitting balance-Leahy Scale: Normal       Standing balance-Leahy Scale: Good                               Pertinent Vitals/Pain Pain Assessment: No/denies pain    Home Living Family/patient expects to be discharged to:: Private residence Living Arrangements: Children Available Help at Discharge: Family;Available PRN/intermittently         Home Layout: Two level Home Equipment: None      Prior Function Level of Independence: Independent               Hand Dominance        Extremity/Trunk Assessment   Upper Extremity Assessment Upper Extremity Assessment: Overall WFL for tasks assessed    Lower Extremity Assessment Lower Extremity Assessment: Overall WFL for tasks assessed    Cervical / Trunk Assessment Cervical / Trunk Assessment: Normal  Communication   Communication: No difficulties  Cognition Arousal/Alertness: Awake/alert Behavior During Therapy: WFL for tasks assessed/performed Overall Cognitive Status: Within Functional Limits for tasks assessed  General Comments      Exercises     Assessment/Plan    PT Assessment Patient needs continued PT services  PT Problem List Decreased activity tolerance;Decreased balance       PT Treatment Interventions Gait training;Balance training;Patient/family education    PT Goals (Current goals can be found in the Care Plan section)  Acute Rehab PT Goals Patient Stated Goal: return home PT Goal Formulation: With patient Time For Goal Achievement: 05/24/17 Potential to Achieve Goals: Fair    Frequency Min 3X/week   Barriers to discharge        Co-evaluation                AM-PAC PT "6 Clicks" Daily Activity  Outcome Measure Difficulty turning over in bed (including adjusting bedclothes, sheets and blankets)?: None Difficulty moving from lying on back to sitting on the side of the bed? : None Difficulty sitting down on and standing up from a chair with arms (e.g., wheelchair, bedside commode, etc,.)?: None Help needed moving to and from a bed to chair (including a wheelchair)?: None Help needed walking in hospital room?: A Little Help needed climbing 3-5 steps with a railing? : A Little 6 Click Score: 22    End of Session Equipment Utilized During Treatment: Gait belt Activity Tolerance: Patient tolerated treatment well Patient left: in chair;with call bell/phone within reach Nurse Communication: Mobility status PT Visit Diagnosis: Unsteadiness on feet (R26.81);Difficulty in walking, not elsewhere classified (R26.2)    Time: 1031-5945 PT Time Calculation (min) (ACUTE ONLY): 15 min   Charges:   PT Evaluation $PT Eval Low Complexity: 1 Low     PT G Codes:          Philomena Doheny 05/10/2017, 12:10 PM [phone]

## 2017-05-10 NOTE — Progress Notes (Signed)
PT Cancellation Note  Patient Details Name: Brittany Mercado MRN: [id number] DOB: 11/25/1950   Cancelled Treatment:    Reason Eval/Treat Not Completed: Medical issues which prohibited therapy(potassium 2.6, will hold until pt receives potassium. )   Philomena Doheny 05/10/2017, 10:21 AM [phone]

## 2017-05-13 LAB — CULTURE, BLOOD (ROUTINE X 2)
CULTURE: NO GROWTH
CULTURE: NO GROWTH
SPECIAL REQUESTS: ADEQUATE
SPECIAL REQUESTS: ADEQUATE

## 2017-05-15 ENCOUNTER — Telehealth: Payer: Self-pay | Admitting: Family Medicine

## 2017-05-15 NOTE — Telephone Encounter (Signed)
It appears I have openings. OK to schedule. TY.

## 2017-05-15 NOTE — Telephone Encounter (Signed)
Copied from [place] [phone]. Topic: Appointment Scheduling - Scheduling Inquiry for Clinic >> May 15, 2017 11:54 AM Scherrie Gerlach wrote: Reason for CRM:  pt was in the hospital last week.  Pt states she is not feeling well. Not eating, no taste buds. Emotionally not feeling well.  Pt has been scheduled for appt  mon 3/25 which was the first available. Pt would like to discuss her meds with Dr Nani Ravens.  Pt states she is trying to stay positive, but is it hard. If Dr can see her prior to Monday, she would appreciate

## 2017-05-16 ENCOUNTER — Encounter: Payer: Self-pay | Admitting: Family Medicine

## 2017-05-16 ENCOUNTER — Other Ambulatory Visit: Payer: Self-pay | Admitting: Family Medicine

## 2017-05-16 ENCOUNTER — Ambulatory Visit (INDEPENDENT_AMBULATORY_CARE_PROVIDER_SITE_OTHER): Payer: Medicare Other | Admitting: Family Medicine

## 2017-05-16 ENCOUNTER — Telehealth: Payer: Self-pay | Admitting: Family Medicine

## 2017-05-16 VITALS — BP 120/51 | HR 102 | Temp 97.6°F | Ht 63.0 in | Wt 84.0 lb

## 2017-05-16 DIAGNOSIS — R0789 Other chest pain: Secondary | ICD-10-CM | POA: Diagnosis not present

## 2017-05-16 DIAGNOSIS — R1310 Dysphagia, unspecified: Secondary | ICD-10-CM

## 2017-05-16 DIAGNOSIS — E119 Type 2 diabetes mellitus without complications: Secondary | ICD-10-CM | POA: Diagnosis not present

## 2017-05-16 DIAGNOSIS — R1319 Other dysphagia: Secondary | ICD-10-CM

## 2017-05-16 DIAGNOSIS — E1110 Type 2 diabetes mellitus with ketoacidosis without coma: Secondary | ICD-10-CM | POA: Diagnosis not present

## 2017-05-16 DIAGNOSIS — R627 Adult failure to thrive: Secondary | ICD-10-CM | POA: Diagnosis not present

## 2017-05-16 LAB — CBC
HEMATOCRIT: 33.8 % — AB (ref 36.0–46.0)
HEMOGLOBIN: 11.6 g/dL — AB (ref 12.0–15.0)
MCHC: 34.5 g/dL (ref 30.0–36.0)
MCV: 82.9 fl (ref 78.0–100.0)
PLATELETS: 491 10*3/uL — AB (ref 150.0–400.0)
RBC: 4.07 Mil/uL (ref 3.87–5.11)
RDW: 15.9 % — AB (ref 11.5–15.5)
WBC: 11 10*3/uL — ABNORMAL HIGH (ref 4.0–10.5)

## 2017-05-16 LAB — TSH: TSH: 7.04 u[IU]/mL — ABNORMAL HIGH (ref 0.35–4.50)

## 2017-05-16 LAB — T4, FREE: FREE T4: 0.8 ng/dL (ref 0.60–1.60)

## 2017-05-16 MED ORDER — MEGESTROL ACETATE 625 MG/5ML PO SUSP: 625.0000 mg | Freq: Every day | ORAL | 2 refills | Status: AC

## 2017-05-16 MED ORDER — INSULIN PEN NEEDLE 31G X 8 MM MISC: 2 refills | Status: AC

## 2017-05-16 MED ORDER — DICLOFENAC SODIUM 2 % TD SOLN: TRANSDERMAL | 1 refills | Status: DC

## 2017-05-16 MED ORDER — MELOXICAM 15 MG PO TABS: 15.0000 mg | ORAL_TABLET | Freq: Every day | ORAL | 0 refills | Status: DC

## 2017-05-16 NOTE — Patient Instructions (Signed)
If you do not hear anything about your referrals in the next 1-2 weeks, call our office and ask for an update.  Ice/cold pack over area for 10-15 min twice daily.  OK to take Tylenol 1000 mg (2 extra strength tabs) or 975 mg (3 regular strength tabs) every 6 hours as needed.  Use the Mobic again.  Try topical Icy Hot vs lidocaine patches or both.  Let us know if you need anything.

## 2017-05-16 NOTE — Progress Notes (Signed)
Pre visit review using our clinic review tool, if applicable. No additional management support is needed unless otherwise documented below in the visit note. 

## 2017-05-16 NOTE — Telephone Encounter (Signed)
Called and rescheduled appt for today at 10:15

## 2017-05-16 NOTE — Progress Notes (Signed)
Chief Complaint  Patient presents with  . Hospitalization Follow-up    HPI Brittany Mercado is a 67 y.o. y.o. female who presents for a transition of care visit.  Pt was discharged from [facility] on [facility]/10/17.  Within 48 business hours of discharge our office contacted him via telephone to coordinate his care and needs.   3/13-3/15 at [facility] for DKA. 5 u/meal Humalog added. Pt still having sugars in 300's, notes very little improvement since starting this.  She is currently on 6 units of Lantus nightly.  She continues to take metformin and Amaryl as well.  The patient continues to lose weight.  She has lost 2 pounds since discharge from the hospital.  She has no appetite.  The patient was placed on Remeron and noted that it has not helped.  Her children are trying to get her to drink protein shakes and water.  Sometimes she will start to throw up without preceding nausea.  The patient feels that sometimes when she does eat, it gets stuck in the chest area.  She has continued right-sided pain.  She cannot tolerate codeine or oxycodone.  She tried Norco but did not like the way it made her feel and the pain was not helped.  Mobic did the best for her.  A topical anti-inflammatory was called in, however she did not get a call from the company.  Tylenol is not sufficient.  She is not icing the area.  No other topicals.  Social History   Socioeconomic History  . Marital status: Single   Past Medical History:  Diagnosis Date  . Adenocarcinoma of right lung, stage 4 ([place]) 11/30/2015  . Complication of anesthesia    hard to wake up  . Diabetes mellitus type 2 in nonobese ([facility])   . Eczema   . Encounter for antineoplastic chemotherapy 11/30/2015  . Heart murmur    told at age 36 but never has had any problems  . History of radiation therapy 01/26/16-02/07/16   right pelvis 30 Gy in 10 fractions  . HTN (hypertension) 02/01/2016  . 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 Iron deficiency anemia due to chronic blood  loss 12/10/2016   Family History  Problem Relation Age of Onset  . Colon cancer Neg Hx   . Esophageal cancer Neg Hx   . Rectal cancer Neg Hx   . Stomach cancer Neg Hx    Allergies as of 05/16/2017      Reactions   Penicillins Swelling   SWELLING REACTION UNSPECIFIED  Has patient had a PCN reaction causing immediate rash, facial/tongue/throat swelling, SOB or lightheadedness with hypotension: Yes Has patient had a PCN reaction causing severe rash involving mucus membranes or skin necrosis: No Has patient had a PCN reaction that required hospitalization No Has patient had a PCN reaction occurring within the last 10 years: No If all of the above answers are "NO", then may proceed with Cephalosporin use.      Medication List        Accurate as of 05/16/17 12:34 PM. Always use your most recent med list.          aspirin 325 MG tablet Take 325 mg by mouth daily.   blood glucose meter kit and supplies Kit Dispense based on patient and insurance preference. Use up to four times daily as directed. (FOR ICD-9 250.00, 250.01).   cetirizine 10 MG tablet Commonly known as:  ZYRTEC TAKE 1 TABLET(10 MG) BY MOUTH DAILY   Diclofenac Sodium 2 % Soln  Apply thin layer over affected areas on ribs 4 times daily as needed for pain.   famotidine 10 MG chewable tablet Commonly known as:  PEPCID AC Chew 10 mg by mouth daily as needed for heartburn.   ferrous sulfate 325 (65 FE) MG tablet Take 325 mg by mouth daily.   glimepiride 2 MG tablet Commonly known as:  AMARYL Take 2 mg by mouth daily with breakfast.   Insulin Glargine 100 UNIT/ML Solostar Pen Commonly known as:  LANTUS Inject 6 Units into the skin daily at 10 pm.   insulin lispro 100 UNIT/ML KiwkPen Commonly known as:  HUMALOG Inject 6 units before meals.   Insulin Pen Needle 31G X 8 MM Misc Commonly known as:  B-D ULTRAFINE III SHORT PEN USE AS DIRECTED WITH INSULIN   levothyroxine 100 MCG tablet Commonly known as:   SYNTHROID, LEVOTHROID TAKE 1 TABLET(100 MCG) BY MOUTH DAILY BEFORE BREAKFAST   megestrol 625 MG/5ML suspension Commonly known as:  MEGACE ES Take 5 mLs (625 mg total) by mouth daily.   meloxicam 15 MG tablet Commonly known as:  MOBIC Take 1 tablet (15 mg total) by mouth daily.   metFORMIN 500 MG tablet Commonly known as:  GLUCOPHAGE Take 2 tablets (1,000 mg total) by mouth 2 (two) times daily with a meal. (take as written in dc summary)   multivitamin tablet Take 1 tablet by mouth daily.   ondansetron 4 MG tablet Commonly known as:  ZOFRAN Take 4 mg by mouth every 8 (eight) hours as needed for nausea or vomiting.   protein supplement shake Liqd Commonly known as:  PREMIER PROTEIN Take 325 mLs (11 oz total) by mouth daily.       ROS:  Constitutional: No fevers or chills, +weight loss HEENT: No headaches, hearing loss, or runny nose, no sore throat Heart: No current chest pain  Lungs: No SOB, no cough Abd: As noted in HPI GU: No urinary complaints Neuro: No numbness, tingling Msk: No joint pain, +Chest wall pain  Objective BP (!) 120/51 (BP Location: Left Arm, Patient Position: Sitting, Cuff Size: Normal)   Pulse (!) 102   Temp 97.6 F (36.4 C) (Oral)   Ht 5' 3"  (1.6 m)   Wt 84 lb (38.1 kg)   SpO2 97%   BMI 14.88 kg/m  General Appearance:  awake, alert, oriented, cachectic Skin:  there are no suspicious lesions or rashes of concern Head/face:  NCAT Eyes:  EOMI, PERRLA Ears:  canals and TMs NI Nose/Sinuses:  negative Mouth/Throat:  Mucosa moist, no lesions; pharynx without erythema, edema or exudate. Neck:  neck- supple, no mass, non-tender and no jvd Lungs: Clear to auscultation.  No rales, rhonchi, or wheezing. Normal effort, no accessory muscle use. Heart:  Heart sounds are normal.  Regular rate and rhythm without murmur, gallop or rub. No bruits. Abdomen:  BS+, soft, NT, ND, no masses or organomegaly Musculoskeletal: No TTP over chest wall Neurologic:   Alert and oriented x 3, DTRs equal and symmetric Psych exam: Nml mood and affect, age appropriate judgment and insight  Adult failure to thrive - Plan: Amb Referral to Palliative Care, TSH, T4, free  Diabetes mellitus type 2 in nonobese ([place]) - Plan: insulin lispro (HUMALOG) 100 UNIT/ML KiwkPen, Ambulatory referral to Endocrinology  Chest wall pain - Plan: Diclofenac Sodium 2 % SOLN, Amb Referral to Palliative Care  Esophageal dysphagia - Plan: Ambulatory referral to Gastroenterology  Diabetic ketoacidosis without coma associated with type 2 diabetes mellitus ([place]) - Plan:  CBC, Basic metabolic panel  Discharge summary and medication list have been reviewed/reconciled.  Labs pending at the time of discharge have been reviewed or are still pending at the time of this visit.  Follow-up labs and appointments have been ordered and/or coordinated appropriately. Educational materials regarding the patient's admitting diagnosis provided.  TRANSITIONAL CARE MANAGEMENT CERTIFICATION:  I certify the following are true:   1. Communication with the patient/care giver was made within 2 business days of discharge.  2. Complexity of Medical decision making is moderate.  3. Face to face visit occurred within 14 days of discharge.   Start Megace, refer to palliative care for both pain management of costochondritis and failure to thrive.  Stop Remeron. Refer to gastroenterology for dysphasia and vomiting.  Hopefully they will do an EGD. Recheck labs. Follow-up pending the above. The patient voiced understanding and agreement to the plan.  Home, DO 05/16/17 12:34 PM

## 2017-05-16 NOTE — Telephone Encounter (Signed)
Clarified with the pharmacy the direction had to state one pump 4 times per day. Gave verbal.

## 2017-05-16 NOTE — Telephone Encounter (Signed)
Copied from [place] [id number]. Topic: Quick Communication - Rx Refill/Question >> May 16, 2017  1:39 PM Scherrie Gerlach wrote: Medication: Diclofenac Sodium 2 % SOL [facility] pharmacy calling to advise they cannot accept this rx with the instructions of   "Apply thin layer over affected areas on ribs 4 times daily as needed for pain"   They need specific dosage instructions   Call for verbal [phone] or resend Rx if preferred

## 2017-05-17 LAB — BASIC METABOLIC PANEL
BUN: 11 mg/dL (ref 6–23)
CALCIUM: 9.7 mg/dL (ref 8.4–10.5)
CHLORIDE: 98 meq/L (ref 96–112)
CO2: 25 mEq/L (ref 19–32)
CREATININE: 0.85 mg/dL (ref 0.40–1.20)
GFR: 86.09 mL/min (ref >60.00)
Glucose, Bld: 143 mg/dL — ABNORMAL HIGH (ref 70–99)
Potassium: 4 mEq/L (ref 3.5–5.1)
Sodium: 139 mEq/L (ref 135–145)

## 2017-05-20 ENCOUNTER — Inpatient Hospital Stay: Payer: Medicare Other | Admitting: Family Medicine

## 2017-05-21 ENCOUNTER — Ambulatory Visit (HOSPITAL_COMMUNITY): Payer: Medicare Other

## 2017-05-21 ENCOUNTER — Ambulatory Visit (HOSPITAL_COMMUNITY)
Admission: RE | Admit: 2017-05-21 | Discharge: 2017-05-21 | Disposition: A | Discharge status: Home / Self Care | Payer: Medicare Other | Source: Ambulatory Visit | Attending: Internal Medicine | Admitting: Internal Medicine

## 2017-05-21 DIAGNOSIS — E279 Disorder of adrenal gland, unspecified: Secondary | ICD-10-CM

## 2017-05-21 DIAGNOSIS — E1152 Type 2 diabetes mellitus with diabetic peripheral angiopathy with gangrene: Secondary | ICD-10-CM | POA: Diagnosis not present

## 2017-05-21 DIAGNOSIS — K862 Cyst of pancreas: Secondary | ICD-10-CM | POA: Insufficient documentation

## 2017-05-21 DIAGNOSIS — R590 Localized enlarged lymph nodes: Secondary | ICD-10-CM

## 2017-05-21 DIAGNOSIS — R918 Other nonspecific abnormal finding of lung field: Secondary | ICD-10-CM | POA: Insufficient documentation

## 2017-05-21 DIAGNOSIS — C3490 Malignant neoplasm of unspecified part of unspecified bronchus or lung: Secondary | ICD-10-CM

## 2017-05-21 DIAGNOSIS — J439 Emphysema, unspecified: Secondary | ICD-10-CM

## 2017-05-21 MED ORDER — IOPAMIDOL (ISOVUE-300) INJECTION 61%
75.0000 mL | Freq: Once | INTRAVENOUS | Status: AC | PRN
  Administered 2017-05-21: 80 mL via INTRAVENOUS

## 2017-05-21 MED ORDER — IOPAMIDOL (ISOVUE-300) INJECTION 61%
INTRAVENOUS | Status: AC
  Filled 2017-05-21: qty 100

## 2017-05-23 ENCOUNTER — Inpatient Hospital Stay (HOSPITAL_BASED_OUTPATIENT_CLINIC_OR_DEPARTMENT_OTHER)
Admission: EM | Admit: 2017-05-23 | Discharge: 2017-05-31 | DRG: 270 | Disposition: A | Discharge status: Home Health | Payer: Medicare Other | Attending: Family Medicine | Admitting: Family Medicine

## 2017-05-23 ENCOUNTER — Other Ambulatory Visit: Payer: Self-pay

## 2017-05-23 ENCOUNTER — Emergency Department (HOSPITAL_BASED_OUTPATIENT_CLINIC_OR_DEPARTMENT_OTHER): Payer: Medicare Other

## 2017-05-23 ENCOUNTER — Encounter (HOSPITAL_BASED_OUTPATIENT_CLINIC_OR_DEPARTMENT_OTHER): Payer: Self-pay

## 2017-05-23 DIAGNOSIS — E43 Unspecified severe protein-calorie malnutrition: Secondary | ICD-10-CM | POA: Diagnosis not present

## 2017-05-23 DIAGNOSIS — R627 Adult failure to thrive: Secondary | ICD-10-CM | POA: Diagnosis present

## 2017-05-23 DIAGNOSIS — Z794 Long term (current) use of insulin: Secondary | ICD-10-CM | POA: Diagnosis not present

## 2017-05-23 DIAGNOSIS — Z79899 Other long term (current) drug therapy: Secondary | ICD-10-CM

## 2017-05-23 DIAGNOSIS — K219 Gastro-esophageal reflux disease without esophagitis: Secondary | ICD-10-CM | POA: Diagnosis not present

## 2017-05-23 DIAGNOSIS — I959 Hypotension, unspecified: Secondary | ICD-10-CM | POA: Diagnosis not present

## 2017-05-23 DIAGNOSIS — E119 Type 2 diabetes mellitus without complications: Secondary | ICD-10-CM | POA: Diagnosis not present

## 2017-05-23 DIAGNOSIS — E1310 Other specified diabetes mellitus with ketoacidosis without coma: Secondary | ICD-10-CM

## 2017-05-23 DIAGNOSIS — E1122 Type 2 diabetes mellitus with diabetic chronic kidney disease: Secondary | ICD-10-CM | POA: Diagnosis present

## 2017-05-23 DIAGNOSIS — R202 Paresthesia of skin: Secondary | ICD-10-CM | POA: Diagnosis present

## 2017-05-23 DIAGNOSIS — Z923 Personal history of irradiation: Secondary | ICD-10-CM

## 2017-05-23 DIAGNOSIS — E7800 Pure hypercholesterolemia, unspecified: Secondary | ICD-10-CM | POA: Diagnosis present

## 2017-05-23 DIAGNOSIS — L97519 Non-pressure chronic ulcer of other part of right foot with unspecified severity: Secondary | ICD-10-CM | POA: Diagnosis not present

## 2017-05-23 DIAGNOSIS — R6510 Systemic inflammatory response syndrome (SIRS) of non-infectious origin without acute organ dysfunction: Secondary | ICD-10-CM | POA: Diagnosis present

## 2017-05-23 DIAGNOSIS — E1152 Type 2 diabetes mellitus with diabetic peripheral angiopathy with gangrene: Secondary | ICD-10-CM | POA: Diagnosis not present

## 2017-05-23 DIAGNOSIS — D500 Iron deficiency anemia secondary to blood loss (chronic): Secondary | ICD-10-CM | POA: Diagnosis not present

## 2017-05-23 DIAGNOSIS — I10 Essential (primary) hypertension: Secondary | ICD-10-CM | POA: Diagnosis not present

## 2017-05-23 DIAGNOSIS — Z681 Body mass index (BMI) 19 or less, adult: Secondary | ICD-10-CM | POA: Diagnosis not present

## 2017-05-23 DIAGNOSIS — C3491 Malignant neoplasm of unspecified part of right bronchus or lung: Secondary | ICD-10-CM | POA: Diagnosis not present

## 2017-05-23 DIAGNOSIS — N390 Urinary tract infection, site not specified: Secondary | ICD-10-CM | POA: Diagnosis not present

## 2017-05-23 DIAGNOSIS — Z7984 Long term (current) use of oral hypoglycemic drugs: Secondary | ICD-10-CM

## 2017-05-23 DIAGNOSIS — E1110 Type 2 diabetes mellitus with ketoacidosis without coma: Secondary | ICD-10-CM | POA: Diagnosis not present

## 2017-05-23 DIAGNOSIS — B957 Other staphylococcus as the cause of diseases classified elsewhere: Secondary | ICD-10-CM | POA: Diagnosis present

## 2017-05-23 DIAGNOSIS — I70201 Unspecified atherosclerosis of native arteries of extremities, right leg: Secondary | ICD-10-CM | POA: Diagnosis present

## 2017-05-23 DIAGNOSIS — E039 Hypothyroidism, unspecified: Secondary | ICD-10-CM | POA: Diagnosis present

## 2017-05-23 DIAGNOSIS — R079 Chest pain, unspecified: Secondary | ICD-10-CM | POA: Diagnosis not present

## 2017-05-23 DIAGNOSIS — Z7982 Long term (current) use of aspirin: Secondary | ICD-10-CM

## 2017-05-23 DIAGNOSIS — I129 Hypertensive chronic kidney disease with stage 1 through stage 4 chronic kidney disease, or unspecified chronic kidney disease: Secondary | ICD-10-CM | POA: Diagnosis present

## 2017-05-23 DIAGNOSIS — I96 Gangrene, not elsewhere classified: Secondary | ICD-10-CM | POA: Diagnosis present

## 2017-05-23 DIAGNOSIS — Z880 Allergy status to penicillin: Secondary | ICD-10-CM

## 2017-05-23 DIAGNOSIS — I70261 Atherosclerosis of native arteries of extremities with gangrene, right leg: Secondary | ICD-10-CM | POA: Diagnosis not present

## 2017-05-23 DIAGNOSIS — L0390 Cellulitis, unspecified: Secondary | ICD-10-CM | POA: Diagnosis not present

## 2017-05-23 DIAGNOSIS — N183 Chronic kidney disease, stage 3 (moderate): Secondary | ICD-10-CM | POA: Diagnosis present

## 2017-05-23 DIAGNOSIS — L97509 Non-pressure chronic ulcer of other part of unspecified foot with unspecified severity: Secondary | ICD-10-CM

## 2017-05-23 DIAGNOSIS — N179 Acute kidney failure, unspecified: Secondary | ICD-10-CM | POA: Diagnosis present

## 2017-05-23 DIAGNOSIS — E871 Hypo-osmolality and hyponatremia: Secondary | ICD-10-CM | POA: Diagnosis not present

## 2017-05-23 DIAGNOSIS — R0602 Shortness of breath: Secondary | ICD-10-CM | POA: Diagnosis not present

## 2017-05-23 DIAGNOSIS — E1165 Type 2 diabetes mellitus with hyperglycemia: Secondary | ICD-10-CM

## 2017-05-23 DIAGNOSIS — Z87891 Personal history of nicotine dependence: Secondary | ICD-10-CM | POA: Diagnosis not present

## 2017-05-23 DIAGNOSIS — N898 Other specified noninflammatory disorders of vagina: Secondary | ICD-10-CM | POA: Diagnosis present

## 2017-05-23 DIAGNOSIS — D473 Essential (hemorrhagic) thrombocythemia: Secondary | ICD-10-CM | POA: Diagnosis not present

## 2017-05-23 DIAGNOSIS — Z9221 Personal history of antineoplastic chemotherapy: Secondary | ICD-10-CM

## 2017-05-23 HISTORY — DX: Adult failure to thrive: R62.7

## 2017-05-23 LAB — LIPASE, BLOOD: LIPASE: 214 U/L — AB (ref 11–51)

## 2017-05-23 LAB — COMPREHENSIVE METABOLIC PANEL
ALT: 12 U/L — AB (ref 14–54)
AST: 14 U/L — AB (ref 15–41)
Albumin: 3.4 g/dL — ABNORMAL LOW (ref 3.5–5.0)
Alkaline Phosphatase: 124 U/L (ref 38–126)
Anion gap: 23 — ABNORMAL HIGH (ref 5–15)
BUN: 15 mg/dL (ref 6–20)
CHLORIDE: 101 mmol/L (ref 101–111)
CO2: 9 mmol/L — AB (ref 22–32)
Calcium: 8.9 mg/dL (ref 8.9–10.3)
Creatinine, Ser: 1.35 mg/dL — ABNORMAL HIGH (ref 0.44–1.00)
GFR calc Af Amer: 47 mL/min — ABNORMAL LOW (ref >60)
GFR, EST NON AFRICAN AMERICAN: 40 mL/min — AB (ref >60)
Glucose, Bld: 430 mg/dL — ABNORMAL HIGH (ref 65–99)
POTASSIUM: 4.7 mmol/L (ref 3.5–5.1)
Sodium: 133 mmol/L — ABNORMAL LOW (ref 135–145)
Total Bilirubin: 1 mg/dL (ref 0.3–1.2)
Total Protein: 8.2 g/dL — ABNORMAL HIGH (ref 6.5–8.1)

## 2017-05-23 LAB — CBC
HEMATOCRIT: 37.3 % (ref 36.0–46.0)
Hemoglobin: 13.5 g/dL (ref 12.0–15.0)
MCH: 28.2 pg (ref 26.0–34.0)
MCHC: 36.2 g/dL — AB (ref 30.0–36.0)
MCV: 77.9 fL — AB (ref 78.0–100.0)
PLATELETS: 510 10*3/uL — AB (ref 150–400)
RBC: 4.79 MIL/uL (ref 3.87–5.11)
RDW: 15.8 % — AB (ref 11.5–15.5)
WBC: 15.5 10*3/uL — ABNORMAL HIGH (ref 4.0–10.5)

## 2017-05-23 LAB — CBG MONITORING, ED
GLUCOSE-CAPILLARY: 165 mg/dL — AB (ref 65–99)
Glucose-Capillary: 220 mg/dL — ABNORMAL HIGH (ref 65–99)
Glucose-Capillary: 200 mg/dL — ABNORMAL HIGH (ref 65–99)

## 2017-05-23 LAB — TROPONIN I

## 2017-05-23 LAB — I-STAT CG4 LACTIC ACID, ED: LACTIC ACID, VENOUS: 1.58 mmol/L (ref 0.5–1.9)

## 2017-05-23 MED ORDER — DEXTROSE-NACL 5-0.45 % IV SOLN
INTRAVENOUS | Status: DC
  Administered 2017-05-23: 22:00:00 via INTRAVENOUS

## 2017-05-23 MED ORDER — SODIUM CHLORIDE 0.9 % IV SOLN
INTRAVENOUS | Status: DC
  Administered 2017-05-23: 22:00:00 via INTRAVENOUS

## 2017-05-23 MED ORDER — SODIUM CHLORIDE 0.9 % IV BOLUS: 1000.0000 mL | Freq: Once | INTRAVENOUS | Status: DC

## 2017-05-23 MED ORDER — POTASSIUM CHLORIDE 10 MEQ/100ML IV SOLN
10.0000 meq | INTRAVENOUS | Status: AC
  Administered 2017-05-23 (×2): 10 meq via INTRAVENOUS
  Filled 2017-05-23 (×2): qty 100

## 2017-05-23 MED ORDER — INSULIN REGULAR HUMAN 100 UNIT/ML IJ SOLN
INTRAMUSCULAR | Status: DC
  Administered 2017-05-23: 1.1 [IU]/h via INTRAVENOUS
  Filled 2017-05-23 (×2): qty 1

## 2017-05-23 MED ORDER — SODIUM CHLORIDE 0.9 % IV BOLUS
1000.0000 mL | Freq: Once | INTRAVENOUS | Status: AC
  Administered 2017-05-23: 1000 mL via INTRAVENOUS

## 2017-05-23 MED ORDER — MORPHINE SULFATE (PF) 2 MG/ML IV SOLN
2.0000 mg | Freq: Once | INTRAVENOUS | Status: AC
  Administered 2017-05-23: 2 mg via INTRAVENOUS
  Filled 2017-05-23: qty 1

## 2017-05-23 MED ORDER — ONDANSETRON HCL 4 MG/2ML IJ SOLN
4.0000 mg | Freq: Once | INTRAMUSCULAR | Status: AC
  Administered 2017-05-23: 4 mg via INTRAVENOUS
  Filled 2017-05-23: qty 2

## 2017-05-23 NOTE — Care Management (Signed)
This is a no charge note  Transfer from [facility] per Dr. Laverta Baltimore  67 year old lady with past medical history of hypertension, diabetes mellitus, GERD, hypothyroidism, iron deficiency anemia, NSCLC-stage IV, who presents with generalized weakness, decreased oral intake, weight loss, some abdominal pain and chest pain. Found to have DKA with bicarbonate 9, blood sugar 430, anion gap 23.  Patient was found to have WBC 15.5, lactic acid 1.58, negative troponin, lipase 214, acute renal injury with creatinine 1.35, temperature normal. Pending urinalysis. Patient is admitted to stepdown bed as inpatient. Insulin drip was started.   Please call manager of [facility] at [phone] when pt arrives to floor   Ivor Costa, MD  [facility] Pager [phone]  If 7PM-7AM, please contact night-coverage [email or web address] Password [facility] 05/23/2017, 10:09 PM

## 2017-05-23 NOTE — ED Provider Notes (Signed)
Emergency Department Provider Note   I have reviewed the triage vital signs and the nursing notes.   HISTORY  Chief Complaint Anorexia   HPI Brittany Mercado is a 66 y.o. female with PMH of stage 4 lung adenocarcinoma, HTN, DM with recent admission for DKA, and anemia presents to the emergency department for evaluation of worsening generalized weakness, abdominal pain, elevated blood sugars, and decreased appetite.  She continues to lose weight and has been increasingly inactive.  She is currently taking Keytruda for her lung cancer.  Denies fevers or chills.  No vomiting or diarrhea.  Despite not eating well her blood sugars have been elevated.  She is also having some central chest discomfort but denies dyspnea.   Past Medical History:  Diagnosis Date  . Adenocarcinoma of right lung, stage 4 ([place] [place] [facility]) 11/30/2015  . Adult failure to thrive   . Complication of anesthesia    hard to wake up  . Diabetes mellitus type 2 in nonobese (HCC)   . Eczema   . Encounter for antineoplastic chemotherapy 11/30/2015  . Heart murmur    told at age 30 but never has had any problems  . History of radiation therapy 01/26/16-02/07/16   right pelvis 30 Gy in 10 fractions  . HTN (hypertension) 02/01/2016  . 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 Iron deficiency anemia due to chronic blood loss 12/10/2016    Patient Active Problem List   Diagnosis Date Noted  . GERD (gastroesophageal reflux disease) 05/23/2017  . Hypothyroidism 05/23/2017  . Pressure injury of skin 05/09/2017  . Hypokalemia 05/09/2017  . SIRS (systemic inflammatory response syndrome) ([place]) 05/09/2017  . Lactic acidosis 05/09/2017  . DKA (diabetic ketoacidoses) ([place]) 05/08/2017  . AKI (acute kidney injury) ([place]) 05/08/2017  . Adult failure to thrive 04/29/2017  . Severe protein-calorie malnutrition ([place]) 04/10/2017  . Acute encephalopathy 04/09/2017  . Community acquired pneumonia 04/09/2017  . Encephalopathy acute 04/08/2017  .  Diabetes mellitus type 2 in nonobese ([place]) 04/01/2017  . Hyperglycemia 03/25/2017  . Chest wall pain 03/21/2017  . 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 Chronic fatigue 12/24/2016  . Iron deficiency anemia due to chronic blood loss 12/10/2016  . HTN (hypertension) 02/01/2016  . Encounter for antineoplastic immunotherapy 12/21/2015  . Adenocarcinoma of right lung, stage 4 ([place]) 11/30/2015  . Encounter for antineoplastic chemotherapy 11/30/2015  . Tobacco abuse 11/10/2015  . Lung mass 10/11/2015    Past Surgical History:  Procedure Laterality Date  . ABDOMINAL HYSTERECTOMY    . OOPHORECTOMY  1996   tumor removed from right ovary  . TONSILLECTOMY    . VIDEO BRONCHOSCOPY WITH ENDOBRONCHIAL NAVIGATION N/A 11/18/2015   Procedure: VIDEO BRONCHOSCOPY WITH ENDOBRONCHIAL NAVIGATION;  Surgeon: Melrose Nakayama, MD;  Location: [place];  Service: Thoracic;  Laterality: N/A;  . VIDEO BRONCHOSCOPY WITH ENDOBRONCHIAL ULTRASOUND N/A 11/18/2015   Procedure: VIDEO BRONCHOSCOPY WITH ENDOBRONCHIAL ULTRASOUND;  Surgeon: Melrose Nakayama, MD;  Location: [place];  Service: Thoracic;  Laterality: N/A;    Current Outpatient Rx  . Order #: [id number] Class: Historical Med  . Order #: [id number] Class: Print  . Order #: [id number] Class: Normal  . Order #: [id number] Class: Normal  . Order #: [id number] Class: Historical Med  . Order #: [id number] Class: Historical Med  . Order #: [id number] Class: Historical Med  . Order #: [id number] Class: Historical Med  . Order #: [id number] Class: Historical Med  . Order #: [id number] Class: Normal  . Order #: [id number] Class: Normal  . Order #: [id number] Class: Normal  . Order #: [id number] Class: Normal  .  Order #: [id number] Class: Normal  . Order #: [id number] Class: Historical Med  . Order #: [id number] Class: Historical Med  . Order #: [id number] Class: Print    Allergies Penicillins  Family History  Problem Relation Age of Onset  . Colon cancer Neg Hx   . Esophageal cancer Neg Hx   .  Rectal cancer Neg Hx   . Stomach cancer Neg Hx     Social History Social History   Tobacco Use  . Smoking status: Former Smoker    Packs/day: 0.50    Years: 30.00    Pack years: 15.00    Last attempt to quit: 11/01/2015    Years since quitting: 1.5  . Smokeless tobacco: Never Used  Substance Use Topics  . Alcohol use: No  . Drug use: No    Review of Systems  Constitutional: No fever/chills. Positive generalized weakness.  Eyes: No visual changes. ENT: No sore throat. Cardiovascular: Positive chest pain. Respiratory: Denies shortness of breath. Gastrointestinal: Positive diffuse abdominal pain. Positive nausea, no vomiting.  No diarrhea.  No constipation. Genitourinary: Negative for dysuria. Musculoskeletal: Negative for back pain. Skin: Negative for rash. Neurological: Negative for headaches, focal weakness or numbness.  10-point ROS otherwise negative.  ____________________________________________   PHYSICAL EXAM:  VITAL SIGNS: ED Triage Vitals  Enc Vitals Group     BP 05/23/17 1942 (!) 161/82     Pulse --      Resp 05/23/17 1942 (!) 32     Weight 05/23/17 1941 76 lb 4.5 oz (34.6 kg)     Height 05/23/17 1941 5\' 3"  (1.6 m)     Pain Score 05/23/17 1939 6   Constitutional: Alert and oriented. Very thin and chronically ill-appearing.  Eyes: Conjunctivae are normal.  Head: Atraumatic. Nose: No congestion/rhinnorhea. Mouth/Throat: Mucous membranes are very dry.  Neck: No stridor. Cardiovascular: Tachycardia. Good peripheral circulation. Grossly normal heart sounds.   Respiratory: Increased respiratory effort.  No retractions. Lungs CTAB. Gastrointestinal: Soft and nontender. No distention.  Musculoskeletal: No lower extremity tenderness nor edema. No gross deformities of extremities. Neurologic:  Normal speech and language. No gross focal neurologic deficits are appreciated.  Skin:  Skin is warm, dry and intact. No rash  noted.  ____________________________________________   LABS (all labs ordered are listed, but only abnormal results are displayed)  Labs Reviewed  CBC - Abnormal; Notable for the following components:      Result Value   WBC 15.5 (*)    MCV 77.9 (*)    MCHC 36.2 (*)    RDW 15.8 (*)    Platelets 510 (*)    All other components within normal limits  COMPREHENSIVE METABOLIC PANEL - Abnormal; Notable for the following components:   Sodium 133 (*)    CO2 9 (*)    Glucose, Bld 430 (*)    Creatinine, Ser 1.35 (*)    Total Protein 8.2 (*)    Albumin 3.4 (*)    AST 14 (*)    ALT 12 (*)    GFR calc non Af Amer 40 (*)    GFR calc Af Amer 47 (*)    Anion gap 23 (*)    All other components within normal limits  LIPASE, BLOOD - Abnormal; Notable for the following components:   Lipase 214 (*)    All other components within normal limits  CBG MONITORING, ED - Abnormal; Notable for the following components:   Glucose-Capillary 220 (*)    All other components within normal limits  CBG MONITORING, ED -  Abnormal; Notable for the following components:   Glucose-Capillary 165 (*)    All other components within normal limits  CBG MONITORING, ED - Abnormal; Notable for the following components:   Glucose-Capillary 200 (*)    All other components within normal limits  TROPONIN I  URINALYSIS, ROUTINE W REFLEX MICROSCOPIC  I-STAT CG4 LACTIC ACID, ED  I-STAT CG4 LACTIC ACID, ED   ____________________________________________  EKG   EKG Interpretation  Date/Time:  Thursday May 23 2017 20:06:08 EDT Ventricular Rate:  98 PR Interval:    QRS Duration: 94 QT Interval:  346 QTC Calculation: 442 R Axis:   75 Text Interpretation:  Sinus rhythm Right atrial enlargement Baseline wander in lead(s) V2 No STEMI.  Confirmed by Nanda Quinton [phone]) on 05/23/2017 8:28:16 PM       ____________________________________________  RADIOLOGY  Dg Chest 2 View  Result Date: 05/23/2017 CLINICAL DATA:   Shortness of breath. Dyspnea. Chest pain. Right lung carcinoma. EXAM: CHEST - 2 VIEW COMPARISON:  Chest CT on 05/21/2017 and chest radiograph on 05/08/2017 FINDINGS: Heart size remains normal.  Aortic atherosclerosis. Pulmonary hyperinflation is again seen, consistent with COPD. Asymmetric soft tissue prominence in the right hilum is unchanged consistent with lymphadenopathy. Mass seen in superior segment of right lower lobe on recent chest CT is not well visualized on this exam. No evidence of pleural effusion or pneumothorax. IMPRESSION: Stable COPD and right hilar lymphadenopathy. No acute findings. See recent chest CT report. Electronically Signed   By: Earle Gell M.D.   On: 05/23/2017 20:57    ____________________________________________   PROCEDURES  Procedure(s) performed:   .Critical Care Performed by: Margette Fast, MD Authorized by: Margette Fast, MD   Critical care provider statement:    Critical care time (minutes):  45   Critical care time was exclusive of:  Separately billable procedures and treating other patients and teaching time   Critical care was necessary to treat or prevent imminent or life-threatening deterioration of the following conditions:  Endocrine crisis   Critical care was time spent personally by me on the following activities:  Blood draw for specimens, development of treatment plan with patient or surrogate, evaluation of patient's response to treatment, examination of patient, obtaining history from patient or surrogate, ordering and performing treatments and interventions, ordering and review of laboratory studies, ordering and review of radiographic studies, review of old charts, re-evaluation of patient's condition and pulse oximetry   I assumed direction of critical care for this patient from another provider in my specialty: no     ___________________________________________   INITIAL IMPRESSION / ASSESSMENT AND PLAN / ED COURSE  Pertinent labs &  imaging results that were available during my care of the patient were reviewed by me and considered in my medical decision making (see chart for details).  Patient presents to the emergency department for evaluation of generalized weakness, decreased appetite, weight loss, hyperglycemia, with associated chest pain and nausea.  The patient is cachectic with very dry mucous membranes.  She is soft spoken with no neurological deficits.  She does have increased work of breathing.  I suspicion for DKA is elevated. Plan for IVF, pain medication, and reassess. No evidence of infection. Doubt. ACS.   Reviewed patient's labs and CXR. Patient with DKA. No PNA on CXR. Starting insulin drip, IVF, potassium, and will admit to hospitalist.   Discussed patient's case with Hospitalist, Dr. Blaine Hamper to request admission. Patient and family (if present) updated with plan. Care transferred to [facility]  service.  I reviewed all nursing notes, vitals, pertinent old records, EKGs, labs, imaging (as available).  ____________________________________________  FINAL CLINICAL IMPRESSION(S) / ED DIAGNOSES  Final diagnoses:  Diabetic ketoacidosis without coma associated with other specified diabetes mellitus ([place])     MEDICATIONS GIVEN DURING THIS VISIT:  Medications  insulin regular (NOVOLIN R,HUMULIN R) 100 Units in sodium chloride 0.9 % 100 mL (1 Units/mL) infusion (2.8 Units/hr Intravenous Rate/Dose Change 05/23/17 2304)  sodium chloride 0.9 % bolus 1,000 mL (1,000 mLs Intravenous Not Given 05/23/17 2202)    And  0.9 %  sodium chloride infusion ( Intravenous Stopped 05/23/17 2215)  dextrose 5 %-0.45 % sodium chloride infusion ( Intravenous New Bag/Given 05/23/17 2215)  potassium chloride 10 mEq in 100 mL IVPB (10 mEq Intravenous New Bag/Given 05/23/17 2259)  sodium chloride 0.9 % bolus 1,000 mL (0 mLs Intravenous Stopped 05/23/17 2215)  morphine 2 MG/ML injection 2 mg (2 mg Intravenous Given 05/23/17 2106)  ondansetron  (ZOFRAN) injection 4 mg (4 mg Intravenous Given 05/23/17 2107)    Note:  This document was prepared using Dragon voice recognition software and may include unintentional dictation errors.  Nanda Quinton, MD Emergency Medicine    Jermarcus Mcfadyen, [facility], MD 05/23/17 [phone]

## 2017-05-23 NOTE — ED Notes (Signed)
Unable to obtain complete VS-pt taken to tx area to complete

## 2017-05-23 NOTE — ED Triage Notes (Addendum)
Per son pt with decreased po intake, weight loss, inactivity-pt with recent admn for same-presents to triage in w/c-labored resp-c/o pain to abd and chest-son also states pt having elevated BS

## 2017-05-24 ENCOUNTER — Telehealth: Payer: Self-pay | Admitting: *Deleted

## 2017-05-24 ENCOUNTER — Telehealth: Payer: Self-pay | Admitting: Family Medicine

## 2017-05-24 ENCOUNTER — Encounter (HOSPITAL_COMMUNITY): Payer: Self-pay

## 2017-05-24 DIAGNOSIS — I10 Essential (primary) hypertension: Secondary | ICD-10-CM

## 2017-05-24 DIAGNOSIS — N179 Acute kidney failure, unspecified: Secondary | ICD-10-CM

## 2017-05-24 DIAGNOSIS — E039 Hypothyroidism, unspecified: Secondary | ICD-10-CM

## 2017-05-24 DIAGNOSIS — E1110 Type 2 diabetes mellitus with ketoacidosis without coma: Secondary | ICD-10-CM | POA: Diagnosis present

## 2017-05-24 DIAGNOSIS — L97509 Non-pressure chronic ulcer of other part of unspecified foot with unspecified severity: Secondary | ICD-10-CM

## 2017-05-24 DIAGNOSIS — K219 Gastro-esophageal reflux disease without esophagitis: Secondary | ICD-10-CM

## 2017-05-24 DIAGNOSIS — C3491 Malignant neoplasm of unspecified part of right bronchus or lung: Secondary | ICD-10-CM

## 2017-05-24 DIAGNOSIS — D500 Iron deficiency anemia secondary to blood loss (chronic): Secondary | ICD-10-CM

## 2017-05-24 DIAGNOSIS — Z794 Long term (current) use of insulin: Secondary | ICD-10-CM

## 2017-05-24 LAB — URINALYSIS, ROUTINE W REFLEX MICROSCOPIC
Bacteria, UA: NONE SEEN
Bilirubin Urine: NEGATIVE
Glucose, UA: >=500 mg/dL — AB
Ketones, ur: 80 mg/dL — AB
Nitrite: NEGATIVE
PROTEIN: 100 mg/dL — AB
SPECIFIC GRAVITY, URINE: 1.015 (ref 1.005–1.030)
pH: 5 (ref 5.0–8.0)

## 2017-05-24 LAB — TROPONIN I

## 2017-05-24 LAB — CBC
HEMATOCRIT: 27.9 % — AB (ref 36.0–46.0)
Hemoglobin: 9.7 g/dL — ABNORMAL LOW (ref 12.0–15.0)
MCH: 27.4 pg (ref 26.0–34.0)
MCHC: 34.8 g/dL (ref 30.0–36.0)
MCV: 78.8 fL (ref 78.0–100.0)
PLATELETS: 370 10*3/uL (ref 150–400)
RBC: 3.54 MIL/uL — AB (ref 3.87–5.11)
RDW: 16 % — ABNORMAL HIGH (ref 11.5–15.5)
WBC: 11.7 10*3/uL — ABNORMAL HIGH (ref 4.0–10.5)

## 2017-05-24 LAB — BASIC METABOLIC PANEL
Anion gap: 17 — ABNORMAL HIGH (ref 5–15)
Anion gap: 8 (ref 5–15)
Anion gap: 9 (ref 5–15)
BUN: 12 mg/dL (ref 6–20)
BUN: 11 mg/dL (ref 6–20)
BUN: 10 mg/dL (ref 6–20)
CALCIUM: 8.1 mg/dL — AB (ref 8.9–10.3)
CHLORIDE: 110 mmol/L (ref 101–111)
CHLORIDE: 112 mmol/L — AB (ref 101–111)
CO2: 11 mmol/L — AB (ref 22–32)
CO2: 18 mmol/L — AB (ref 22–32)
CO2: 17 mmol/L — AB (ref 22–32)
CREATININE: 0.91 mg/dL (ref 0.44–1.00)
CREATININE: 0.99 mg/dL (ref 0.44–1.00)
Calcium: 8.7 mg/dL — ABNORMAL LOW (ref 8.9–10.3)
Calcium: 8.2 mg/dL — ABNORMAL LOW (ref 8.9–10.3)
Chloride: 108 mmol/L (ref 101–111)
Creatinine, Ser: 1.18 mg/dL — ABNORMAL HIGH (ref 0.44–1.00)
GFR calc Af Amer: 55 mL/min — ABNORMAL LOW (ref >60)
GFR calc Af Amer: >60 mL/min (ref >60)
GFR calc Af Amer: >60 mL/min (ref >60)
GFR calc non Af Amer: 47 mL/min — ABNORMAL LOW (ref >60)
GFR calc non Af Amer: >60 mL/min (ref >60)
GFR, EST NON AFRICAN AMERICAN: 59 mL/min — AB (ref >60)
GLUCOSE: 167 mg/dL — AB (ref 65–99)
Glucose, Bld: 294 mg/dL — ABNORMAL HIGH (ref 65–99)
Glucose, Bld: 107 mg/dL — ABNORMAL HIGH (ref 65–99)
POTASSIUM: 5.2 mmol/L — AB (ref 3.5–5.1)
POTASSIUM: 3.8 mmol/L (ref 3.5–5.1)
Potassium: 3.9 mmol/L (ref 3.5–5.1)
Sodium: 138 mmol/L (ref 135–145)
Sodium: 138 mmol/L (ref 135–145)
Sodium: 134 mmol/L — ABNORMAL LOW (ref 135–145)

## 2017-05-24 LAB — GLUCOSE, CAPILLARY
GLUCOSE-CAPILLARY: 125 mg/dL — AB (ref 65–99)
GLUCOSE-CAPILLARY: 146 mg/dL — AB (ref 65–99)
GLUCOSE-CAPILLARY: 167 mg/dL — AB (ref 65–99)
GLUCOSE-CAPILLARY: 176 mg/dL — AB (ref 65–99)
GLUCOSE-CAPILLARY: 192 mg/dL — AB (ref 65–99)
GLUCOSE-CAPILLARY: 245 mg/dL — AB (ref 65–99)
GLUCOSE-CAPILLARY: 71 mg/dL (ref 65–99)
Glucose-Capillary: 251 mg/dL — ABNORMAL HIGH (ref 65–99)
Glucose-Capillary: 242 mg/dL — ABNORMAL HIGH (ref 65–99)
Glucose-Capillary: 138 mg/dL — ABNORMAL HIGH (ref 65–99)
Glucose-Capillary: 61 mg/dL — ABNORMAL LOW (ref 65–99)
Glucose-Capillary: 148 mg/dL — ABNORMAL HIGH (ref 65–99)
Glucose-Capillary: 150 mg/dL — ABNORMAL HIGH (ref 65–99)
Glucose-Capillary: 115 mg/dL — ABNORMAL HIGH (ref 65–99)
Glucose-Capillary: 151 mg/dL — ABNORMAL HIGH (ref 65–99)
Glucose-Capillary: 153 mg/dL — ABNORMAL HIGH (ref 65–99)
Glucose-Capillary: 191 mg/dL — ABNORMAL HIGH (ref 65–99)

## 2017-05-24 LAB — CK: CK TOTAL: 41 U/L (ref 38–234)

## 2017-05-24 LAB — PROCALCITONIN: Procalcitonin: 0.22 ng/mL

## 2017-05-24 LAB — MRSA PCR SCREENING: MRSA BY PCR: NEGATIVE

## 2017-05-24 MED ORDER — SODIUM CHLORIDE 0.9 % IV SOLN: INTRAVENOUS | Status: DC

## 2017-05-24 MED ORDER — INSULIN GLARGINE 100 UNIT/ML ~~LOC~~ SOLN
6.0000 [IU] | Freq: Every day | SUBCUTANEOUS | Status: DC
  Administered 2017-05-24 – 2017-05-27 (×4): 6 [IU] via SUBCUTANEOUS
  Filled 2017-05-24 (×4): qty 0.06

## 2017-05-24 MED ORDER — DEXTROSE 50 % IV SOLN
25.0000 mL | INTRAVENOUS | Status: DC | PRN
  Administered 2017-05-24 (×2): 25 mL via INTRAVENOUS
  Administered 2017-05-24: 16 mL via INTRAVENOUS
  Filled 2017-05-24: qty 50

## 2017-05-24 MED ORDER — LEVOFLOXACIN IN D5W 750 MG/150ML IV SOLN
750.0000 mg | Freq: Once | INTRAVENOUS | Status: AC
  Administered 2017-05-24: 750 mg via INTRAVENOUS
  Filled 2017-05-24: qty 150

## 2017-05-24 MED ORDER — SODIUM CHLORIDE 0.9 % IV SOLN
1.0000 g | Freq: Three times a day (TID) | INTRAVENOUS | Status: DC
  Filled 2017-05-24: qty 1

## 2017-05-24 MED ORDER — LIP MEDEX EX OINT
TOPICAL_OINTMENT | CUTANEOUS | Status: AC
  Administered 2017-05-24: 1
  Filled 2017-05-24: qty 7

## 2017-05-24 MED ORDER — FLUCONAZOLE 200 MG PO TABS
200.0000 mg | ORAL_TABLET | Freq: Once | ORAL | Status: AC
  Administered 2017-05-24: 200 mg via ORAL
  Filled 2017-05-24: qty 1

## 2017-05-24 MED ORDER — MEGESTROL ACETATE 400 MG/10ML PO SUSP
400.0000 mg | Freq: Two times a day (BID) | ORAL | Status: DC
  Administered 2017-05-24 – 2017-05-31 (×14): 400 mg via ORAL
  Filled 2017-05-24 (×18): qty 10

## 2017-05-24 MED ORDER — ENOXAPARIN SODIUM 30 MG/0.3ML ~~LOC~~ SOLN
30.0000 mg | Freq: Every day | SUBCUTANEOUS | Status: DC
  Administered 2017-05-24 – 2017-05-30 (×7): 30 mg via SUBCUTANEOUS
  Filled 2017-05-24 (×9): qty 0.3

## 2017-05-24 MED ORDER — FERROUS SULFATE 325 (65 FE) MG PO TABS
325.0000 mg | ORAL_TABLET | Freq: Every day | ORAL | Status: DC
  Administered 2017-05-24 – 2017-05-31 (×8): 325 mg via ORAL
  Filled 2017-05-24 (×8): qty 1

## 2017-05-24 MED ORDER — INSULIN REGULAR BOLUS VIA INFUSION
0.0000 [IU] | Freq: Three times a day (TID) | INTRAVENOUS | Status: DC
  Filled 2017-05-24: qty 10

## 2017-05-24 MED ORDER — ONDANSETRON HCL 4 MG PO TABS: 4.0000 mg | ORAL_TABLET | Freq: Four times a day (QID) | ORAL | Status: DC | PRN

## 2017-05-24 MED ORDER — LEVOTHYROXINE SODIUM 100 MCG PO TABS
100.0000 ug | ORAL_TABLET | Freq: Every day | ORAL | Status: DC
Start: 2017-05-24 — End: 2017-05-31
  Administered 2017-05-24 – 2017-05-31 (×7): 100 ug via ORAL
  Filled 2017-05-24 (×7): qty 1

## 2017-05-24 MED ORDER — ENSURE ENLIVE PO LIQD
237.0000 mL | Freq: Two times a day (BID) | ORAL | Status: DC
  Administered 2017-05-25 – 2017-05-31 (×10): 237 mL via ORAL
  Filled 2017-05-24 (×6): qty 237

## 2017-05-24 MED ORDER — FENTANYL CITRATE (PF) 100 MCG/2ML IJ SOLN
12.5000 ug | INTRAMUSCULAR | Status: DC | PRN
  Administered 2017-05-28: 12.5 ug via INTRAVENOUS
  Filled 2017-05-24: qty 2

## 2017-05-24 MED ORDER — INSULIN REGULAR HUMAN 100 UNIT/ML IJ SOLN
INTRAMUSCULAR | Status: DC
  Filled 2017-05-24: qty 1

## 2017-05-24 MED ORDER — FAMOTIDINE 20 MG PO TABS
10.0000 mg | ORAL_TABLET | Freq: Every day | ORAL | Status: DC | PRN
  Administered 2017-05-24: 10 mg via ORAL
  Filled 2017-05-24: qty 1

## 2017-05-24 MED ORDER — ONDANSETRON HCL 4 MG/2ML IJ SOLN
4.0000 mg | Freq: Four times a day (QID) | INTRAMUSCULAR | Status: DC | PRN
  Administered 2017-05-25: 4 mg via INTRAVENOUS
  Filled 2017-05-24: qty 2

## 2017-05-24 MED ORDER — ASPIRIN 325 MG PO TABS
325.0000 mg | ORAL_TABLET | Freq: Every day | ORAL | Status: DC
  Administered 2017-05-24 – 2017-05-28 (×5): 325 mg via ORAL
  Filled 2017-05-24 (×5): qty 1

## 2017-05-24 MED ORDER — SODIUM CHLORIDE 0.9 % IV SOLN
2.0000 g | Freq: Once | INTRAVENOUS | Status: AC
  Administered 2017-05-24: 2 g via INTRAVENOUS
  Filled 2017-05-24: qty 2

## 2017-05-24 MED ORDER — MEGESTROL ACETATE 400 MG/10ML PO SUSP: 800.0000 mg | Freq: Every day | ORAL | Status: DC

## 2017-05-24 MED ORDER — SODIUM CHLORIDE 0.45 % IV SOLN
INTRAVENOUS | Status: DC
  Administered 2017-05-24 – 2017-05-25 (×4): via INTRAVENOUS

## 2017-05-24 MED ORDER — ORAL CARE MOUTH RINSE
15.0000 mL | Freq: Two times a day (BID) | OROMUCOSAL | Status: DC
  Administered 2017-05-24 – 2017-05-31 (×11): 15 mL via OROMUCOSAL

## 2017-05-24 MED ORDER — DEXTROSE 50 % IV SOLN
INTRAVENOUS | Status: AC
  Filled 2017-05-24: qty 50

## 2017-05-24 MED ORDER — ACETAMINOPHEN 650 MG RE SUPP: 650.0000 mg | Freq: Four times a day (QID) | RECTAL | Status: DC | PRN

## 2017-05-24 MED ORDER — INSULIN ASPART 100 UNIT/ML ~~LOC~~ SOLN
0.0000 [IU] | Freq: Three times a day (TID) | SUBCUTANEOUS | Status: DC
  Administered 2017-05-24: 2 [IU] via SUBCUTANEOUS
  Administered 2017-05-25: 5 [IU] via SUBCUTANEOUS
  Administered 2017-05-25: 3 [IU] via SUBCUTANEOUS
  Administered 2017-05-26: 1 [IU] via SUBCUTANEOUS
  Administered 2017-05-26: 2 [IU] via SUBCUTANEOUS
  Administered 2017-05-27: 1 [IU] via SUBCUTANEOUS
  Administered 2017-05-27 – 2017-05-28 (×3): 3 [IU] via SUBCUTANEOUS
  Administered 2017-05-28: 2 [IU] via SUBCUTANEOUS
  Administered 2017-05-28 – 2017-05-29 (×2): 7 [IU] via SUBCUTANEOUS
  Administered 2017-05-29: 2 [IU] via SUBCUTANEOUS
  Administered 2017-05-30: 13:00:00 9 [IU] via SUBCUTANEOUS
  Administered 2017-05-30: 5 [IU] via SUBCUTANEOUS
  Administered 2017-05-31: 13:00:00 2 [IU] via SUBCUTANEOUS
  Administered 2017-05-31: 5 [IU] via SUBCUTANEOUS

## 2017-05-24 MED ORDER — INSULIN ASPART 100 UNIT/ML ~~LOC~~ SOLN
0.0000 [IU] | Freq: Every day | SUBCUTANEOUS | Status: DC
  Administered 2017-05-26: 3 [IU] via SUBCUTANEOUS
  Administered 2017-05-29: 5 [IU] via SUBCUTANEOUS

## 2017-05-24 MED ORDER — METOCLOPRAMIDE HCL 5 MG/ML IJ SOLN
5.0000 mg | Freq: Three times a day (TID) | INTRAMUSCULAR | Status: DC
  Administered 2017-05-24 – 2017-05-31 (×16): 5 mg via INTRAVENOUS
  Filled 2017-05-24 (×18): qty 2

## 2017-05-24 MED ORDER — LEVOFLOXACIN IN D5W 500 MG/100ML IV SOLN
500.0000 mg | INTRAVENOUS | Status: DC
  Filled 2017-05-24: qty 100

## 2017-05-24 MED ORDER — ADULT MULTIVITAMIN W/MINERALS CH
1.0000 | ORAL_TABLET | Freq: Every day | ORAL | Status: DC
  Administered 2017-05-24 – 2017-05-31 (×8): 1 via ORAL
  Filled 2017-05-24 (×8): qty 1

## 2017-05-24 MED ORDER — ACETAMINOPHEN 325 MG PO TABS: 650.0000 mg | ORAL_TABLET | Freq: Four times a day (QID) | ORAL | Status: DC | PRN

## 2017-05-24 MED ORDER — DEXTROSE-NACL 5-0.45 % IV SOLN
INTRAVENOUS | Status: DC
  Administered 2017-05-24: 06:00:00 via INTRAVENOUS

## 2017-05-24 NOTE — Progress Notes (Signed)
CRITICAL VALUE ALERT  Critical Value:  cbg 61  Date & Time Notied:  05-24-17 620  Provider Notified: Dr. Harvest Forest  Orders Received/Actions taken: Gave 27ml D50 per protocol & held insulin drip & recheck cbg in 15 minutes.

## 2017-05-24 NOTE — Progress Notes (Signed)
Blood sugar 58 with 2200 accucheck. Pt was given orange juice and a [place] sandwich. Upon recheck blood sugar was 19. Pt is alert and oriented in bed, states that she feels fine. D50 administered. MD notified. Upon rechecked, blood sugar was 56. Currently at bedside with patient, will recheck blood sugar in 5 minutes.

## 2017-05-24 NOTE — Telephone Encounter (Signed)
Pt's son called back, call to advise

## 2017-05-24 NOTE — Progress Notes (Signed)
Pt complained of pain from right little toe, completed assessment and noticed a eschar pressure injury. Doctor was notified and a wound consult was requested.

## 2017-05-24 NOTE — Care Management Note (Signed)
Case Management Note  Patient Details  Name: Brittany Mercado MRN: [id number] Date of Birth: 09/15/1950  Subjective/Objective: 67 y/o f admitted w/DKA. Readmit-DM. From home. Would recc HHRN-instruction on DM disease mgmnt.                   Action/Plan:d/c home w/HHC.   Expected Discharge Date:                  Expected Discharge Plan:  [place]  In-House Referral:     Discharge planning Services  CM Consult  Post Acute Care Choice:    Choice offered to:     DME Arranged:    DME Agency:     HH Arranged:    [place] Agency:     Status of Service:  In process, will continue to follow  If discussed at Long Length of Stay Meetings, dates discussed:    Additional Comments:  Dessa Phi, RN 05/24/2017, 3:44 PM

## 2017-05-24 NOTE — Telephone Encounter (Signed)
Copied from [place] [id number]. Topic: Inquiry >> May 24, 2017  3:57 PM Oliver Pila B wrote: Reason for CRM: pt's son called and asked to speak w/ pt's pcp about a matter which was not disclosed and pt's son wants to know the best course of action to take, call to advise   Donia Guiles the patients son, left a message to call back.

## 2017-05-24 NOTE — Progress Notes (Signed)
Pharmacy Antibiotic Note  Brittany Mercado is a 67 y.o. female admitted on 05/23/2017 with UTI.  Pharmacy has been consulted for Levofloxacin, aztreonam dosing.  Plan: Levofloxacin 750mg  iv x1, then 500mg  iv q48hr Aztreonam 2gm iv x1, then 1gm iv q8hr   Height: 5\' 3"  (160 cm) Weight: 77 lb 6.1 oz (35.1 kg) IBW/kg (Calculated) : 52.4  Temp (24hrs), Avg:97.7 F (36.5 C), Min:97.3 F (36.3 C), Max:98.1 F (36.7 C)  Recent Labs  Lab 05/23/17 2008 05/23/17 2045 05/23/17 2058 05/24/17 0102 05/24/17 0438  WBC 15.5*  --   --   --  11.7*  CREATININE  --  1.35*  --  1.18*  --   LATICACIDVEN  --   --  1.58  --   --     Estimated Creatinine Clearance: 26.3 mL/min (A) (by C-G formula based on SCr of 1.18 mg/dL (H)).    Allergies  Allergen Reactions  . Penicillins Swelling    SWELLING REACTION UNSPECIFIED  Has patient had a PCN reaction causing immediate rash, facial/tongue/throat swelling, SOB or lightheadedness with hypotension: Yes Has patient had a PCN reaction causing severe rash involving mucus membranes or skin necrosis: No Has patient had a PCN reaction that required hospitalization No Has patient had a PCN reaction occurring within the last 10 years: No If all of the above answers are "NO", then may proceed with Cephalosporin use.    Antimicrobials this admission: Levofloxacin 05/24/2017 >> Aztreonam 05/24/2017 >>  Dose adjustments this admission: -  Microbiology results: -  Thank you for allowing pharmacy to be a part of this patient's care.  Brittany Mercado 05/24/2017 5:20 AM

## 2017-05-24 NOTE — Care Management Note (Signed)
Case Management Note  Patient Details  Name: Brittany Mercado MRN: [id number] Date of Birth: 1950/12/24  Subjective/Objective:  67 y/o f admitted wDKA. From home. Readmit-DM.                  Action/Plan:d/c plan home.   Expected Discharge Date:                  Expected Discharge Plan:  Home/Self Care  In-House Referral:     Discharge planning Services  CM Consult  Post Acute Care Choice:    Choice offered to:     DME Arranged:    DME Agency:     HH Arranged:    HH Agency:     Status of Service:  In process, will continue to follow  If discussed at Long Length of Stay Meetings, dates discussed:    Additional Comments:  Dessa Phi, RN 05/24/2017, 11:20 AM

## 2017-05-24 NOTE — Progress Notes (Signed)
Initial Nutrition Assessment  DOCUMENTATION CODES:   Severe malnutrition in context of chronic illness, Underweight  INTERVENTION:  - Continue Ensure Enlive po BID, each supplement provides 350 kcal and 20 grams of protein - Will order daily multivitamin with minerals.  - Continue to encourage PO intakes of meals and both Ensure supplements each day.  - RD will monitor for additional nutrition-related needs.   NUTRITION DIAGNOSIS:   Severe Malnutrition related to chronic illness, catabolic illness, cancer and cancer related treatments as evidenced by estimated needs.  GOAL:   Patient will meet greater than or equal to 90% of their needs  MONITOR:   PO intake, Supplement acceptance, Weight trends, Labs  REASON FOR ASSESSMENT:   Malnutrition Screening Tool  ASSESSMENT:   67 y.o. female with medical history significant of metastatic NSCLC on chemotherapy of Keytruda w/ last tx 3/11, HTN, DM type II, hypothyroidism, tobacco abuse; who presented to the emergency department with complaints of worsening generalized weakness, anorexia, and elevated blood sugars  Pt well known to this RD from previous admissions. No intakes documented during admission. Pt reports that she has not been feeling hungry and did not eat breakfast this AM d/t this. She reports intermittent nausea with associated vomiting x1 week with last episodes of vomiting on Wednesday (3/27).  This RD had talked with pt and family about purchasing protein supplements for home during previous admissions but pt had not purchased any as of admission 2 weeks ago. Today she reports that she was able to purchase Premier Protein and was drinking a chocolate-flavored one once/day. She was also drinking water but not consuming any other form of nutrition over the past at least one week.  Talked with pt about ways to increase nutrition intake during hospitalization. She is not interested in trying any foods but is willing to try  drinking 2 protein shakes per day.   Physical assessment outlined below. Per chart review, pt has lost 10 lbs (11.5% body weight) in the past 18 days. This is significant for time frame, although noted pt currently on high rate IVF. Will monitor weight trends closely.  Medications reviewed; 325 mg ferrous sulfate/day, sliding scale Novolog, 6 units/day, 100 mcg oral Synthroid/day, 400 mg Megace BID, 5 mg IV Reglan TID, multivitamin with minerals, 10 mEq IV KCl x2 runs yesterday.  Labs reviewed; CBGs: 61-215 mg/dL since midnight, Na: 134 mmol/L, Ca: 8.1 mg/dL.  IVF: 1/2 NS @ 100 mL/hr.      NUTRITION - FOCUSED PHYSICAL EXAM:    Most Recent Value  Orbital Region  Moderate depletion  Upper Arm Region  Severe depletion  Thoracic and Lumbar Region  Severe depletion  Buccal Region  Moderate depletion  Temple Region  Moderate depletion  Clavicle Bone Region  Severe depletion  Clavicle and Acromion Bone Region  Severe depletion  Scapular Bone Region  Severe depletion  Dorsal Hand  Severe depletion  Patellar Region  Unable to assess  Anterior Thigh Region  Unable to assess  Posterior Calf Region  Unable to assess  Edema (RD Assessment)  Unable to assess  Hair  Unable to assess [wearing a wig]  Eyes  Reviewed  Mouth  Reviewed  Skin  Reviewed  Nails  Unable to assess [nails are painted]       Diet Order:  Diet Carb Modified Fluid consistency: Thin; Room service appropriate? Yes  EDUCATION NEEDS:   No education needs have been identified at this time  Skin:  Skin Assessment: Reviewed RN Assessment  Last BM:  PTA/unknown  Height:   Ht Readings from Last 1 Encounters:  05/24/17 5\' 3"  (1.6 m)    Weight:   Wt Readings from Last 1 Encounters:  05/24/17 77 lb 6.1 oz (35.1 kg)    Ideal Body Weight:  52.27 kg  BMI:  Body mass index is 13.71 kg/m.  Estimated Nutritional Needs:   Kcal:  1405-1650 (40-47 kcal/kg)  Protein:  63-77 grams (1.8-2.2 grams/kg)  Fluid:  >/= 1.6  L/day      Jarome Matin, MS, RD, LDN, [facility] Inpatient Clinical Dietitian Pager # [phone] After hours/weekend pager # [phone]

## 2017-05-24 NOTE — H&P (Addendum)
History and Physical    Brittany Mercado XBD:[id number] DOB: 1951/02/26 DOA: 05/23/2017  Referring MD/NP/PA: Dr. Blaine Hamper PCP: [name], DO  Patient coming from: [facility] transfer  Chief Complaint: Weakness  I have personally briefly reviewed patient's old medical records in [place]   HPI: Brittany Mercado is a 67 y.o. female with medical history significant of metastatic NSCLC on chemotherapy of Keytruda w/ last tx 3/11, HTN, DM type II, hypothyroidism, tobacco abuse; who presented to the emergency department with complaints of worsening generalized weakness, anorexia, and elevated blood sugars.  Patient was just hospitalized from 3/13-3/15, after being found to be in DKA with acute metabolic encephalopathy and meeting SIRS criteria without any clear source of infection found.  Patient was placed on glucose stabilizer protocol and switch back to home regimen with blood sugars noted to be stable.  Since being home patient reports having poor appetite, but reports taking all medications including diabetic medication as prescribed.  Despite poor appetite and reportedly taking insulin at home her blood sugars have remained elevated.  Associated symptoms include mild shortness of breath, nausea, vomiting, achy right-sided chest pain that is reproducible with palpation(chronic), suprapubic abdominal pain, and reports of brownish vaginal discharge.  Patient states that she was given a pill previously to try and treat symptoms without relief.  ED Course: Upon admission to the emergency department patient was noted to be afebrile, pulse 102-106, respirations 14 to 32, blood pressures maintained, and O2 saturations maintained on room air.  Patient was found to show signs of DKA with bicarbonate 9, blood sugar 430,  and anion gap 23.  Other labs included WBC 15.5, lactic acid 1.58, negative troponin, lipase 214, acute renal injury with creatinine 1.35, temperature normal. Pending urinalysis. Patient is  admitted to stepdown bed as inpatient. Insulin drip was started.  Review of Systems  Constitutional: Positive for diaphoresis and malaise/fatigue. Negative for chills and fever.  HENT: Negative for hearing loss and nosebleeds.   Eyes: Negative for double vision and pain.  Respiratory: Positive for cough and shortness of breath.   Cardiovascular: Positive for chest pain (Right-sided). Negative for leg swelling.  Gastrointestinal: Positive for abdominal pain, nausea and vomiting.  Genitourinary: Negative for dysuria and frequency.       Positive for vaginal discharge  Musculoskeletal: Positive for myalgias. Negative for falls.  Neurological: Negative for speech change and focal weakness.  Psychiatric/Behavioral: Negative for memory loss and substance abuse.    Past Medical History:  Diagnosis Date  . Adenocarcinoma of right lung, stage 4 ([place]) 11/30/2015  . Adult failure to thrive   . Complication of anesthesia    hard to wake up  . Diabetes mellitus type 2 in nonobese (HCC)   . Eczema   . Encounter for antineoplastic chemotherapy 11/30/2015  . Heart murmur    told at age 85 but never has had any problems  . History of radiation therapy 01/26/16-02/07/16   right pelvis 30 Gy in 10 fractions  . HTN (hypertension) 02/01/2016  . 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 Iron deficiency anemia due to chronic blood loss 12/10/2016    Past Surgical History:  Procedure Laterality Date  . ABDOMINAL HYSTERECTOMY    . OOPHORECTOMY  1996   tumor removed from right ovary  . TONSILLECTOMY    . VIDEO BRONCHOSCOPY WITH ENDOBRONCHIAL NAVIGATION N/A 11/18/2015   Procedure: VIDEO BRONCHOSCOPY WITH ENDOBRONCHIAL NAVIGATION;  Surgeon: Melrose Nakayama, MD;  Location: [place];  Service: Thoracic;  Laterality: N/A;  .  VIDEO BRONCHOSCOPY WITH ENDOBRONCHIAL ULTRASOUND N/A 11/18/2015   Procedure: VIDEO BRONCHOSCOPY WITH ENDOBRONCHIAL ULTRASOUND;  Surgeon: Melrose Nakayama, MD;  Location: [facility];   Service: Thoracic;  Laterality: N/A;     reports that she quit smoking about 18 months ago. She has a 15.00 pack-year smoking history. She has never used smokeless tobacco. She reports that she does not drink alcohol or use drugs.  Allergies  Allergen Reactions  . Penicillins Swelling    SWELLING REACTION UNSPECIFIED  Has patient had a PCN reaction causing immediate rash, facial/tongue/throat swelling, SOB or lightheadedness with hypotension: Yes Has patient had a PCN reaction causing severe rash involving mucus membranes or skin necrosis: No Has patient had a PCN reaction that required hospitalization No Has patient had a PCN reaction occurring within the last 10 years: No If all of the above answers are "NO", then may proceed with Cephalosporin use.    Family History  Problem Relation Age of Onset  . Colon cancer Neg Hx   . Esophageal cancer Neg Hx   . Rectal cancer Neg Hx   . Stomach cancer Neg Hx     Prior to Admission medications   Medication Sig Start Date End Date Taking? Authorizing Provider  aspirin 325 MG tablet Take 325 mg by mouth daily.    [provider]  blood glucose meter kit and supplies KIT Dispense based on patient and insurance preference. Use up to four times daily as directed. (FOR ICD-9 250.00, 250.01). 04/11/17   Elodia Florence., MD  cetirizine (ZYRTEC) 10 MG tablet TAKE 1 TABLET(10 MG) BY MOUTH DAILY 03/04/17   Curt Bears, MD  Diclofenac Sodium 2 % SOLN Apply thin layer over affected areas on ribs 4 times daily as needed for pain. 05/16/17   [name], DO  famotidine (PEPCID AC) 10 MG chewable tablet Chew 10 mg by mouth daily as needed for heartburn.    [provider]  ferrous sulfate 325 (65 FE) MG tablet Take 325 mg by mouth daily.    [provider]  glimepiride (AMARYL) 2 MG tablet Take 2 mg by mouth daily with breakfast.    [provider]  Insulin Glargine (LANTUS) 100 UNIT/ML Solostar Pen  Inject 6 Units into the skin daily at 10 pm. 04/15/17   Wendling, Crosby Oyster, DO  insulin lispro (HUMALOG) 100 UNIT/ML KiwkPen Inject 6 units before meals. 05/16/17   [name], DO  Insulin Pen Needle (B-D ULTRAFINE III SHORT PEN) 31G X 8 MM MISC USE AS DIRECTED WITH INSULIN 05/16/17   Wendling, Crosby Oyster, DO  levothyroxine (SYNTHROID, LEVOTHROID) 100 MCG tablet TAKE 1 TABLET(100 MCG) BY MOUTH DAILY BEFORE BREAKFAST 05/07/17   Curt Bears, MD  megestrol (MEGACE ES) 625 MG/5ML suspension Take 5 mLs (625 mg total) by mouth daily. 05/16/17   [name], DO  meloxicam (MOBIC) 15 MG tablet TAKE 1 TABLET(15 MG) BY MOUTH DAILY 05/16/17   Wendling, Crosby Oyster, DO  metFORMIN (GLUCOPHAGE) 500 MG tablet Take 2 tablets (1,000 mg total) by mouth 2 (two) times daily with a meal. (take as written in dc summary) 04/11/17 05/11/17  Elodia Florence., MD  Multiple Vitamin (MULTIVITAMIN) tablet Take 1 tablet by mouth daily.    [provider]  ondansetron (ZOFRAN) 4 MG tablet Take 4 mg by mouth every 8 (eight) hours as needed for nausea or vomiting.    [provider]  protein supplement shake (PREMIER PROTEIN) LIQD Take 325 mLs (  11 oz total) by mouth daily. 05/10/17   Charlynne Cousins, MD    Physical Exam:  Constitutional: Cachectic chronically ill-appearing female no acute distress at this time Vitals:   05/23/17 2200 05/23/17 2259 05/23/17 2313 05/24/17 0014  BP: (!) 138/59 116/69  (!) 128/46  Pulse: (!) 102 (!) 106    Resp: (!) _0 Temp:  (!) 97.4 F (36.3 C)    TempSrc:  Oral    SpO2: 99% 100% 99%   Weight:      Height:       Eyes: PERRL, lids and conjunctivae normal ENMT: Mucous membranes are dry.  Posterior pharynx clear of any exudate or lesions. Neck: normal, supple, no masses, no thyromegaly Respiratory: clear to auscultation bilaterally, no wheezing, no crackles. Normal respiratory effort. No accessory muscle use.    Cardiovascular: Regular rate and rhythm, no murmurs / rubs / gallops. No extremity edema. 2+ pedal pulses. No carotid bruits.  Abdomen: no tenderness, no masses palpated. No hepatosplenomegaly. Bowel sounds positive.  Musculoskeletal: no clubbing / cyanosis. No joint deformity upper and lower extremities. Good ROM, no contractures. Normal muscle tone.  Skin: no rashes, lesions, ulcers. No induration Neurologic: CN 2-12 grossly intact. Sensation intact, DTR normal. Strength 5/5 in all 4.  Psychiatric: Normal judgment and insight. Alert and oriented x 3. Normal mood.     Labs on Admission: I have personally reviewed following labs and imaging studies  CBC: Recent Labs  Lab 05/23/17 2008  WBC 15.5*  HGB 13.5  HCT 37.3  MCV 77.9*  PLT 034*   Basic Metabolic Panel: Recent Labs  Lab 05/23/17 2045  NA 133*  K 4.7  CL 101  CO2 9*  GLUCOSE 430*  BUN 15  CREATININE 1.35*  CALCIUM 8.9   GFR: Estimated Creatinine Clearance: 22.7 mL/min (A) (by C-G formula based on SCr of 1.35 mg/dL (H)). Liver Function Tests: Recent Labs  Lab 05/23/17 2045  AST 14*  ALT 12*  ALKPHOS 124  BILITOT 1.0  PROT 8.2*  ALBUMIN 3.4*   Recent Labs  Lab 05/23/17 2045  LIPASE 214*   No results for input(s): AMMONIA in the last 168 hours. Coagulation Profile: No results for input(s): INR, PROTIME in the last 168 hours. Cardiac Enzymes: Recent Labs  Lab 05/23/17 2009  TROPONINI <0.03   BNP (last 3 results) No results for input(s): PROBNP in the last 8760 hours. HbA1C: No results for input(s): HGBA1C in the last 72 hours. CBG: Recent Labs  Lab 05/23/17 1954 05/23/17 2208 05/23/17 2303 05/24/17 0016  GLUCAP 220* 165* 200* 245*   Lipid Profile: No results for input(s): CHOL, HDL, LDLCALC, TRIG, CHOLHDL, LDLDIRECT in the last 72 hours. Thyroid Function Tests: No results for input(s): TSH, T4TOTAL, FREET4, T3FREE, THYROIDAB in the last 72 hours. Anemia Panel: No results for input(s):  VITAMINB12, FOLATE, FERRITIN, TIBC, IRON, RETICCTPCT in the last 72 hours. Urine analysis:    Component Value Date/Time   COLORURINE STRAW (A) 05/08/2017 1510   APPEARANCEUR CLEAR 05/08/2017 1510   LABSPEC 1.014 05/08/2017 1510   PHURINE 5.0 05/08/2017 1510   GLUCOSEU >=500 (A) 05/08/2017 1510   HGBUR MODERATE (A) 05/08/2017 1510   BILIRUBINUR NEGATIVE 05/08/2017 1510   KETONESUR 80 (A) 05/08/2017 1510   PROTEINUR 30 (A) 05/08/2017 1510   NITRITE NEGATIVE 05/08/2017 1510   LEUKOCYTESUR LARGE (A) 05/08/2017 1510   Sepsis Labs: No results found for this or any previous visit (from the past 240 hour(s)).  Radiological Exams on Admission: Dg Chest 2 View  Result Date: 05/23/2017 CLINICAL DATA:  Shortness of breath. Dyspnea. Chest pain. Right lung carcinoma. EXAM: CHEST - 2 VIEW COMPARISON:  Chest CT on 05/21/2017 and chest radiograph on 05/08/2017 FINDINGS: Heart size remains normal.  Aortic atherosclerosis. Pulmonary hyperinflation is again seen, consistent with COPD. Asymmetric soft tissue prominence in the right hilum is unchanged consistent with lymphadenopathy. Mass seen in superior segment of right lower lobe on recent chest CT is not well visualized on this exam. No evidence of pleural effusion or pneumothorax. IMPRESSION: Stable COPD and right hilar lymphadenopathy. No acute findings. See recent chest CT report. Electronically Signed   By: Earle Gell M.D.   On: 05/23/2017 20:57    EKG: Independently reviewed.  Sinus rhythm at 98 bpm  Assessment/Plan Diabetic ketoacidosis, in type II: Acute.  Patient's initial blood sugar elevated at 430, CO2 9, and anion gap 23 patient just discharged from the hospital on 3/15 for the same.  Question compliance with home medications.  Last hemoglobin A1c noted to be 8.8. - Admit to stepdown unit  - Hold home diabetic meds medication regimen - Continue glucose stabilizer protocol - Check urinalysis - Antiemetics prn N/V - Serial BMPs,  hemoglobin A1c in a.m.  - Correct electrolytes as needed - Monitoring for AG closure and will transition to subcutaneous insulin once able - Social work and care management consult for need of PCP and barriers of - Diabetes education for possible need of   SIRS/leukocytosis, suspected urinary tract infection: Acute.  Patient was found to be tachycardic with WBC elevated at 15.5, but lactic acid reassuring at 1.58.  Chest x-ray did not show any clear signs of infection.  Urinalysis had not been obtained initially, but patient noted during previous hospitalization to have large leukocytes with rare bacteria and 6-30 WBCs on urinalysis.   - Check blood cultures - Follow-up urinalysis and culture - Empiric antibiotics of Levaquin and aztreonam, de-escalate when medically  Acute kidney injury: Patient's baseline creatinine noted to be within normal limits,   But she presents with a creatinine of 1.35 with BUN 15. - IV fluids as seen above - Recheck CBC  Vaginal discharge: Acute.  Patient reports having brown vaginal discharge, but denies at risk behavior. - Patient may need formal vaginal exam evaluation in a.m  Adenocarcinoma of the right lung stage IV, failure to thrive: Patient with poor overall appetite.  Recently started on Megace - Dr. [name] added to treatment team - Continue Megace  Elevated lipase: Patient with recent CT scan of the abdomen 3 days ago, showing no signs of pancreatitis and previous 6 mm pancreatic cystic lesion no longer seen. - Continue to monitor  Iron deficiency - Continue ferrous sulfate  Hypothyroidism: Last TSH noted to be 7.04 on 3/21. - Continue levothyroxine   Thrombocytosis: Acute.  Platelet count noted to be elevated at 510 suspect likely reactive.  GERD - Continue Pepcid  DVT prophylaxis: Lovenox Code Status: Ful Family Communication: No family present at bedside Disposition Plan: Likely discharge home once medically stable Consults called:  None Admission status: Inpatient  Norval Morton MD [facility] Pager [phone]   If 7PM-7AM, please contact night-coverage [email or web address] Password [facility]  05/24/2017, 12:34 AM

## 2017-05-24 NOTE — Progress Notes (Addendum)
Hoda Hon is a 67 y.o. female with medical history significant of metastatic NSCLC on chemotherapy of Keytruda w/ last tx 3/11, HTN, DM type II, hypothyroidism, tobacco abuse; who presented to the emergency department with complaints of worsening generalized weakness, anorexia, and elevated blood sugars.  Patient was just hospitalized from 3/13-3/15, after being found to be in DKA with acute metabolic encephalopathy and meeting SIRS criteria without any clear source of infection found.  Patient was placed on glucose stabilizer protocol and switch back to home regimen with blood sugars noted to be stable.  Since being home patient reports having poor appetite, but reports taking all medications including diabetic medication as prescribed.  Despite poor appetite and reportedly taking insulin at home her blood sugars have remained elevated. This admission she was found to be in DKA, with unclear etiology.    Plan: Transition to home dose lantus and monitor. As her AG is closed and  Resume SSI.  Physical therapy and nutrition consult.  Pain control.   Hosie Poisson [phone]

## 2017-05-24 NOTE — Progress Notes (Signed)
Lovenox per Pharmacy for DVT Prophylaxis    Pharmacy has been consulted from dosing enoxaparin (lovenox) in this patient for DVT prophylaxis.  The pharmacist has reviewed pertinent labs (Hgb _13.5__; PLT__510_), patient weight (_35.1__kg) and renal function (CrCl_23__mL/min) and decided that enoxaparin _30_mg SQ Q24Hrs is appropriate for this patient.  The pharmacy department will sign off at this time.  Please reconsult pharmacy if status changes or for further issues.  Thank you  Cyndia Diver PharmD, BCPS  05/24/2017, 12:51 AM

## 2017-05-24 NOTE — Progress Notes (Signed)
Blood sugar rechecked, reading 56. Rechecked 5 minutes later reading 22. Second amp of d50 administered, pt given more orange juice, rapid response notified, currently at bedside. MD has been notified. Pt is awake, alert to person, place, time, and situation. Will continue to monitor closely at this time.

## 2017-05-24 NOTE — Telephone Encounter (Signed)
Patient called [place] [facility] to inform us that she is admitted to [facility]. She wanted to cancel her appointments for next week because she wasn't sure if she would be discharged by then. I instructed her that I would not cancel the appointments because we can see if she has been discharged. I instructed her that if she was discharged, to come and see Dr. [name] on the scheduled appointment date. She verbalized understanding.

## 2017-05-24 NOTE — Progress Notes (Signed)
Pt arrived to floor. Pt denies pain at this time with morning assessment unchanged from previous documentation. Will continue to monitor

## 2017-05-25 ENCOUNTER — Encounter (HOSPITAL_COMMUNITY): Payer: Self-pay

## 2017-05-25 DIAGNOSIS — E43 Unspecified severe protein-calorie malnutrition: Secondary | ICD-10-CM

## 2017-05-25 LAB — BASIC METABOLIC PANEL
Anion gap: 7 (ref 5–15)
BUN: 7 mg/dL (ref 6–20)
CO2: 19 mmol/L — ABNORMAL LOW (ref 22–32)
Calcium: 8 mg/dL — ABNORMAL LOW (ref 8.9–10.3)
Chloride: 102 mmol/L (ref 101–111)
Creatinine, Ser: 0.93 mg/dL (ref 0.44–1.00)
GFR calc Af Amer: >60 mL/min (ref >60)
GFR calc non Af Amer: >60 mL/min (ref >60)
Glucose, Bld: 491 mg/dL — ABNORMAL HIGH (ref 65–99)
Potassium: 3.7 mmol/L (ref 3.5–5.1)
Sodium: 128 mmol/L — ABNORMAL LOW (ref 135–145)

## 2017-05-25 LAB — GLUCOSE, CAPILLARY
GLUCOSE-CAPILLARY: 22 mg/dL — AB (ref 65–99)
GLUCOSE-CAPILLARY: 56 mg/dL — AB (ref 65–99)
GLUCOSE-CAPILLARY: 259 mg/dL — AB (ref 65–99)
GLUCOSE-CAPILLARY: 112 mg/dL — AB (ref 65–99)
GLUCOSE-CAPILLARY: 227 mg/dL — AB (ref 65–99)
Glucose-Capillary: 58 mg/dL — ABNORMAL LOW (ref 65–99)
Glucose-Capillary: 112 mg/dL — ABNORMAL HIGH (ref 65–99)
Glucose-Capillary: 19 mg/dL — CL (ref 65–99)
Glucose-Capillary: 376 mg/dL — ABNORMAL HIGH (ref 65–99)
Glucose-Capillary: 82 mg/dL (ref 65–99)

## 2017-05-25 MED ORDER — DOXYCYCLINE HYCLATE 100 MG PO TABS
100.0000 mg | ORAL_TABLET | Freq: Two times a day (BID) | ORAL | Status: DC
  Administered 2017-05-25 – 2017-05-28 (×7): 100 mg via ORAL
  Filled 2017-05-25 (×7): qty 1

## 2017-05-25 MED ORDER — INSULIN ASPART 100 UNIT/ML ~~LOC~~ SOLN
5.0000 [IU] | Freq: Once | SUBCUTANEOUS | Status: AC
  Administered 2017-05-25: 5 [IU] via SUBCUTANEOUS

## 2017-05-25 NOTE — Progress Notes (Addendum)
PROGRESS NOTE    Brittany Mercado  CWC:[id number] DOB: Oct 26, 1950 DOA: 05/23/2017 PCP: [name], DO    Brief Narrative:  67 y.o.femalewith medical history significant of metastatic NSCLC on chemotherapy of Keytruda w/ last tx 3/11, HTN, DM type II, hypothyroidism, tobacco abuse; who presented to the emergency department with complaints of worsening generalized weakness, anorexia, and elevated blood sugars. Patient was just hospitalized from 3/13-3/15, after being found to be in DKA with acute metabolic encephalopathy and meeting SIRS criteria without any clear source of infection found. Patient was placed on glucose stabilizer protocol and switch back to home regimen with blood sugars noted to be stable. Since being home patient reports having poor appetite, but reports taking all medications including diabetic medication as prescribed. Despite poor appetite and reportedly taking insulin at home her blood sugars have remained elevated. This admission she was found to be in DKA, with unclear etiology.     Assessment & Plan:   Principal Problem:   DKA (diabetic ketoacidoses) ([place]) Active Problems:   Adenocarcinoma of right lung, stage 4 (HCC)   HTN (hypertension)   Iron deficiency anemia due to chronic blood loss   Diabetes mellitus type 2 in nonobese (HCC)   Severe protein-calorie malnutrition (HCC)   AKI (acute kidney injury) ([place])   SIRS (systemic inflammatory response syndrome) (HCC)   GERD (gastroesophageal reflux disease)   Hypothyroidism   DKA, type 2 (HCC)   Toe ulcer ([place])   DKA of unclear etiology, ? Staph UTI:  Resolved now.  Over night she became hypoglycemic, with CBG'S as low as 20's. She reports she didn't eat dinner. She was given d 50 twice and another 5 units of novolog.  Her cbgs are better this am.  Encouraged her to eat meals .  CBG (last 3)  Recent Labs    05/25/17 0750 05/25/17 1128 05/25/17 1636  GLUCAP 259* 112* 227*      Staph  UTI: levaquin changed to doxycycline.    Hyponatremia: possibly from hyperglycemia.    Hypothyroidism:  Resume synthroid.   Severe protein calorie malnutrition:  nutritoin consulted.  PT evaluation.    GERD STABLE.    AKI: Resolved with hydration.       DVT prophylaxis: (Lovenox/) Code Status: (full code.  Family Communication: none at bedside.  Disposition Plan: possible home tomorrow after PT evaluation.    Consultants:   None.    Procedures: none.    Antimicrobials: levaquin changed to oral doxycycline.    Subjective: Feeling better today. No chest pain or sob. No nausea, vomiting or dysuria.   Objective: Vitals:   05/25/17 0557 05/25/17 0900 05/25/17 1400 05/25/17 1410  BP: (!) 106/51 (!) 114/45 (!) 98/50 (!) 95/50  Pulse: 87 93 86 86  Resp: 20 20 20 20   Temp: 98.1 F (36.7 C) 97.8 F (36.6 C) 97.7 F (36.5 C) 97.7 F (36.5 C)  TempSrc: Oral Axillary Oral Oral  SpO2: 100% 100% 100% 100%  Weight:      Height:        Intake/Output Summary (Last 24 hours) at 05/25/2017 1926 Last data filed at 05/25/2017 1800 Gross per 24 hour  Intake 2640 ml  Output 200 ml  Net 2440 ml   Filed Weights   05/23/17 1941 05/24/17 0014  Weight: 34.6 kg (76 lb 4.5 oz) 35.1 kg (77 lb 6.1 oz)    Examination:  General exam: Appears calm and comfortable , cachetic looking lady.  Respiratory system: Clear to auscultation. Respiratory effort  normal. Cardiovascular system: S1 & S2 heard, RRR. No JVD, murmurs, rubs, gallops or clicks. No pedal edema. Gastrointestinal system: Abdomen is nondistended, soft and nontender. No organomegaly or masses felt. Normal bowel sounds heard. Central nervous system: Alert and oriented. No focal neurological deficits. Extremities: Symmetric 5 x 5 power. Skin: left toe black eschar.  Psychiatry:  Mood & affect appropriate.     Data Reviewed: I have personally reviewed following labs and imaging studies  CBC: Recent Labs  Lab  05/23/17 2008 05/24/17 0438  WBC 15.5* 11.7*  HGB 13.5 9.7*  HCT 37.3 27.9*  MCV 77.9* 78.8  PLT 510* 811   Basic Metabolic Panel: Recent Labs  Lab 05/23/17 2045 05/24/17 0102 05/24/17 0438 05/24/17 1004 05/24/17 2322  NA 133* 138 138 134* 128*  K 4.7 5.2* 3.8 3.9 3.7  CL 101 110 112* 108 102  CO2 9* 11* 18* 17* 19*  GLUCOSE 430* 294* 107* 167* 491*  BUN 15 12 11 10 7   CREATININE 1.35* 1.18* 0.91 0.99 0.93  CALCIUM 8.9 8.7* 8.2* 8.1* 8.0*   GFR: Estimated Creatinine Clearance: 33.4 mL/min (by C-G formula based on SCr of 0.93 mg/dL). Liver Function Tests: Recent Labs  Lab 05/23/17 2045  AST 14*  ALT 12*  ALKPHOS 124  BILITOT 1.0  PROT 8.2*  ALBUMIN 3.4*   Recent Labs  Lab 05/23/17 2045  LIPASE 214*   No results for input(s): AMMONIA in the last 168 hours. Coagulation Profile: No results for input(s): INR, PROTIME in the last 168 hours. Cardiac Enzymes: Recent Labs  Lab 05/23/17 2009 05/24/17 0135  CKTOTAL  --  41  TROPONINI <0.03 <0.03   BNP (last 3 results) No results for input(s): PROBNP in the last 8760 hours. HbA1C: No results for input(s): HGBA1C in the last 72 hours. CBG: Recent Labs  Lab 05/24/17 2259 05/25/17 0158 05/25/17 0750 05/25/17 1128 05/25/17 1636  GLUCAP 112* 376* 259* 112* 227*   Lipid Profile: No results for input(s): CHOL, HDL, LDLCALC, TRIG, CHOLHDL, LDLDIRECT in the last 72 hours. Thyroid Function Tests: No results for input(s): TSH, T4TOTAL, FREET4, T3FREE, THYROIDAB in the last 72 hours. Anemia Panel: No results for input(s): VITAMINB12, FOLATE, FERRITIN, TIBC, IRON, RETICCTPCT in the last 72 hours. Sepsis Labs: Recent Labs  Lab 05/23/17 2058 05/24/17 0135  PROCALCITON  --  0.22  LATICACIDVEN 1.58  --     Recent Results (from the past 240 hour(s))  MRSA PCR Screening     Status: None   Collection Time: 05/24/17 12:42 AM  Result Value Ref Range Status   MRSA by PCR NEGATIVE NEGATIVE Final    Comment:          The GeneXpert MRSA Assay (FDA approved for NASAL specimens only), is one component of a comprehensive MRSA colonization surveillance program. It is not intended to diagnose MRSA infection nor to guide or monitor treatment for MRSA infections. Performed at [facility], [place] [address], [place], [place] [address]   Culture, blood (x 2)     Status: None (Preliminary result)   Collection Time: 05/24/17  1:35 AM  Result Value Ref Range Status   Specimen Description   Final    BLOOD RIGHT HAND Performed at [facility] [address] [place], [place] [address]    Special Requests   Final    BOTTLES DRAWN AEROBIC ONLY BCAV Performed at [facility] [address] [place], [place] [address]    Culture  Final    NO GROWTH 1 DAY Performed at [facility], [place] [address], [place], [place] [address]    Report Status PENDING  Incomplete  Culture, blood (x 2)     Status: None (Preliminary result)   Collection Time: 05/24/17  1:35 AM  Result Value Ref Range Status   Specimen Description   Final    BLOOD LEFT HAND Performed at [facility] [address], [place], [place] [address]    Special Requests   Final    BOTTLES DRAWN AEROBIC AND ANAEROBIC Blood Culture adequate volume Performed at [facility] [address], [place], [place] [address]    Culture   Final    NO GROWTH 1 DAY Performed at [facility], [place] [address], [place], [place] [address]    Report Status PENDING  Incomplete  Urine culture     Status: Abnormal (Preliminary result)   Collection Time: 05/24/17  2:57 AM  Result Value Ref Range Status   Specimen Description   Final    URINE, RANDOM Performed at [facility] [address], [place], [place] [address]    Special Requests   Final    NONE Performed at [facility], [place] [address] [place], [place]  [address]    Culture (A)  Final    >=100,000 COLONIES/mL STAPHYLOCOCCUS SPECIES (COAGULASE NEGATIVE) SUSCEPTIBILITIES TO FOLLOW Performed at [facility], [place] [address], [place],  [address]    Report Status PENDING  Incomplete         Radiology Studies: Dg Chest 2 View  Result Date: 05/23/2017 CLINICAL DATA:  Shortness of breath. Dyspnea. Chest pain. Right lung carcinoma. EXAM: CHEST - 2 VIEW COMPARISON:  Chest CT on 05/21/2017 and chest radiograph on 05/08/2017 FINDINGS: Heart size remains normal.  Aortic atherosclerosis. Pulmonary hyperinflation is again seen, consistent with COPD. Asymmetric soft tissue prominence in the right hilum is unchanged consistent with lymphadenopathy. Mass seen in superior segment of right lower lobe on recent chest CT is not well visualized on this exam. No evidence of pleural effusion or pneumothorax. IMPRESSION: Stable COPD and right hilar lymphadenopathy. No acute findings. See recent chest CT report. Electronically Signed   By: Earle Gell M.D.   On: 05/23/2017 20:57        Scheduled Meds: . aspirin  325 mg Oral Daily  . enoxaparin (LOVENOX) injection  30 mg Subcutaneous QHS  . feeding supplement (ENSURE ENLIVE)  237 mL Oral BID BM  . ferrous sulfate  325 mg Oral Daily  . insulin aspart  0-5 Units Subcutaneous QHS  . insulin aspart  0-9 Units Subcutaneous TID WC  . insulin glargine  6 Units Subcutaneous Daily  . levothyroxine  100 mcg Oral QAC breakfast  . mouth rinse  15 mL Mouth Rinse BID  . megestrol  400 mg Oral BID  . metoCLOPramide (REGLAN) injection  5 mg Intravenous Q8H  . multivitamin with minerals  1 tablet Oral Daily   Continuous Infusions:   LOS: 2 days    Time spent: 35 minutes.     Hosie Poisson, MD [facility] Pager [phone]  If 7PM-7AM, please contact night-coverage [email or web address] Password [facility] 05/25/2017, 7:26 PM

## 2017-05-25 NOTE — Evaluation (Signed)
Physical Therapy Evaluation Patient Details Name: Brittany Mercado MRN: [id number] DOB: 1950/12/16 Today's Date: 05/25/2017   History of Present Illness  Brittany Mercado is a 67 y.o. 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 11.19, recently diag type 2 DM. Hypertension, hypothyroidism, was brought in by family for hyperglycemia and confusion.  Clinical Impression  Pt admitted with above diagnosis. Pt currently with functional limitations due to the deficits listed below (see PT Problem List).  Pt will benefit from skilled PT to increase their independence and safety with mobility to allow discharge to the venue listed below.  Pt ambulated with RW today due to pain in R pinky toe.  She did verbalize that it helped her feel steadier. Recommend RW for home.     Follow Up Recommendations Supervision - Intermittent    Equipment Recommendations  Rolling walker with 5" wheels    Recommendations for Other Services       Precautions / Restrictions Precautions Precautions: Fall Precaution Comments: pt reports h/o unsteady gait but denies falls  Restrictions Weight Bearing Restrictions: No      Mobility  Bed Mobility Overal bed mobility: Independent                Transfers Overall transfer level: Modified independent                  Ambulation/Gait   Ambulation Distance (Feet): 50 Feet Assistive device: Rolling walker (2 wheeled)       General Gait Details: Pt ambulated in room to the bathroom with reaching out for external support.  She was agreeable to RW this time due to toe hurting her.  Stairs            Wheelchair Mobility    Modified Rankin (Stroke Patients Only)       Balance Overall balance assessment: Needs assistance   Sitting balance-Leahy Scale: Good       Standing balance-Leahy Scale: Good Standing balance comment: Fair + dynamic  and good static                             Pertinent Vitals/Pain Pain Assessment: Faces Faces Pain Scale: Hurts little more Pain Location: R pinky toe Pain Descriptors / Indicators: Numbness Pain Intervention(s): Limited activity within patient's tolerance;Monitored during session    Home Living Family/patient expects to be discharged to:: Private residence Living Arrangements: Children Available Help at Discharge: Family;Available PRN/intermittently         Home Layout: Two level Home Equipment: None      Prior Function Level of Independence: Independent               Hand Dominance        Extremity/Trunk Assessment   Upper Extremity Assessment Upper Extremity Assessment: Defer to OT evaluation    Lower Extremity Assessment Lower Extremity Assessment: Defer to PT evaluation    Cervical / Trunk Assessment Cervical / Trunk Assessment: Normal  Communication   Communication: No difficulties  Cognition Arousal/Alertness: Awake/alert Behavior During Therapy: WFL for tasks assessed/performed Overall Cognitive Status: Within Functional Limits for tasks assessed                                        General Comments  Exercises     Assessment/Plan    PT Assessment Patient needs continued PT services  PT Problem List Decreased activity tolerance;Decreased balance;Decreased mobility       PT Treatment Interventions DME instruction;Gait training;Stair training;Functional mobility training;Therapeutic activities;Therapeutic exercise;Balance training;Neuromuscular re-education    PT Goals (Current goals can be found in the Care Plan section)  Acute Rehab PT Goals Patient Stated Goal: return home PT Goal Formulation: With patient Time For Goal Achievement: 06/08/17 Potential to Achieve Goals: Good    Frequency Min 3X/week   Barriers to discharge        Co-evaluation               AM-PAC PT "6 Clicks" Daily  Activity  Outcome Measure Difficulty turning over in bed (including adjusting bedclothes, sheets and blankets)?: None Difficulty moving from lying on back to sitting on the side of the bed? : None Difficulty sitting down on and standing up from a chair with arms (e.g., wheelchair, bedside commode, etc,.)?: None Help needed moving to and from a bed to chair (including a wheelchair)?: None Help needed walking in hospital room?: A Little Help needed climbing 3-5 steps with a railing? : A Little 6 Click Score: 22    End of Session Equipment Utilized During Treatment: Gait belt Activity Tolerance: Patient tolerated treatment well;Patient limited by fatigue Patient left: in bed;with call bell/phone within reach;with family/visitor present Nurse Communication: Mobility status PT Visit Diagnosis: Unsteadiness on feet (R26.81);Difficulty in walking, not elsewhere classified (R26.2)    Time: 6060-0459 PT Time Calculation (min) (ACUTE ONLY): 21 min   Charges:   PT Evaluation $PT Eval Low Complexity: 1 Low     PT G Codes:        Brylin Stanislawski L. Tamala Julian, [place] Pager [phone] 05/25/2017   Galen Manila 05/25/2017, 1:50 PM

## 2017-05-26 LAB — BASIC METABOLIC PANEL
Anion gap: 7 (ref 5–15)
BUN: 7 mg/dL (ref 6–20)
CHLORIDE: 108 mmol/L (ref 101–111)
CO2: 21 mmol/L — ABNORMAL LOW (ref 22–32)
Calcium: 7.7 mg/dL — ABNORMAL LOW (ref 8.9–10.3)
Creatinine, Ser: 0.82 mg/dL (ref 0.44–1.00)
GFR calc non Af Amer: >60 mL/min (ref >60)
Glucose, Bld: 192 mg/dL — ABNORMAL HIGH (ref 65–99)
Potassium: 3.3 mmol/L — ABNORMAL LOW (ref 3.5–5.1)
SODIUM: 136 mmol/L (ref 135–145)

## 2017-05-26 LAB — GLUCOSE, CAPILLARY
GLUCOSE-CAPILLARY: 121 mg/dL — AB (ref 65–99)
Glucose-Capillary: 198 mg/dL — ABNORMAL HIGH (ref 65–99)
Glucose-Capillary: 105 mg/dL — ABNORMAL HIGH (ref 65–99)
Glucose-Capillary: 298 mg/dL — ABNORMAL HIGH (ref 65–99)

## 2017-05-26 LAB — CBC
HEMATOCRIT: 26.1 % — AB (ref 36.0–46.0)
Hemoglobin: 9.4 g/dL — ABNORMAL LOW (ref 12.0–15.0)
MCH: 28.4 pg (ref 26.0–34.0)
MCHC: 36 g/dL (ref 30.0–36.0)
MCV: 78.9 fL (ref 78.0–100.0)
PLATELETS: 309 10*3/uL (ref 150–400)
RBC: 3.31 MIL/uL — AB (ref 3.87–5.11)
RDW: 16.3 % — ABNORMAL HIGH (ref 11.5–15.5)
WBC: 7.5 10*3/uL (ref 4.0–10.5)

## 2017-05-26 LAB — URINE CULTURE: Culture: >=100000 — AB

## 2017-05-26 MED ORDER — BISACODYL 10 MG RE SUPP: 10.0000 mg | Freq: Every day | RECTAL | Status: DC | PRN

## 2017-05-26 MED ORDER — SENNOSIDES-DOCUSATE SODIUM 8.6-50 MG PO TABS
1.0000 | ORAL_TABLET | Freq: Two times a day (BID) | ORAL | Status: DC
  Administered 2017-05-26 – 2017-05-31 (×4): 1 via ORAL
  Filled 2017-05-26 (×8): qty 1

## 2017-05-26 MED ORDER — POTASSIUM CHLORIDE CRYS ER 10 MEQ PO TBCR
40.0000 meq | EXTENDED_RELEASE_TABLET | Freq: Two times a day (BID) | ORAL | Status: AC
  Administered 2017-05-26: 40 meq via ORAL
  Filled 2017-05-26 (×2): qty 4

## 2017-05-26 MED ORDER — POLYETHYLENE GLYCOL 3350 17 G PO PACK
17.0000 g | PACK | Freq: Every day | ORAL | Status: DC
  Administered 2017-05-26: 17 g via ORAL
  Filled 2017-05-26 (×3): qty 1

## 2017-05-26 NOTE — Progress Notes (Signed)
Nutrition Brief Note  RD consulted on 3/29 for nutritional assessment. Initial assessment was completed 3/29 and pt was diagnosed with severe malnutrition at that time. Ensure supplements were ordered.   If other nutrition issues arise, please consult RD.   Brittany Bibles, MS, RD, [place] Dietitian Pager: [phone] After Hours Pager: [phone]

## 2017-05-26 NOTE — Progress Notes (Signed)
PROGRESS NOTE    Brittany Mercado  GDJ:[id number] DOB: 1950-03-17 DOA: 05/23/2017 PCP: [name], DO    Brief Narrative:  67 y.o.femalewith medical history significant of metastatic NSCLC on chemotherapy of Keytruda w/ last tx 3/11, HTN, DM type II, hypothyroidism, tobacco abuse; who presented to the emergency department with complaints of worsening generalized weakness, anorexia, and elevated blood sugars. Patient was just hospitalized from 3/13-3/15, after being found to be in DKA with acute metabolic encephalopathy and meeting SIRS criteria without any clear source of infection found. Patient was placed on glucose stabilizer protocol and switch back to home regimen with blood sugars noted to be stable. Since being home patient reports having poor appetite, but reports taking all medications including diabetic medication as prescribed. Despite poor appetite and reportedly taking insulin at home her blood sugars have remained elevated. This admission she was found to be in DKA, with unclear etiology.     Assessment & Plan:   Principal Problem:   DKA (diabetic ketoacidoses) ([place]) Active Problems:   Adenocarcinoma of right lung, stage 4 (HCC)   HTN (hypertension)   Iron deficiency anemia due to chronic blood loss   Diabetes mellitus type 2 in nonobese (HCC)   Severe protein-calorie malnutrition (HCC)   AKI (acute kidney injury) ([place])   SIRS (systemic inflammatory response syndrome) (HCC)   GERD (gastroesophageal reflux disease)   Hypothyroidism   DKA, type 2 (HCC)   Toe ulcer ([place])   DKA of unclear etiology, ? Staph UTI:  Resolved now.  Her cbgs' are much better today.    CBG (last 3)  Recent Labs    05/26/17 0743 05/26/17 1131 05/26/17 1634  GLUCAP 198* 121* 105*   Wound not change the insulin dose at this time. Encourage the patient to eat. Diabetes education.    Staph UTI: levaquin changed to doxycycline.    Hyponatremia: possibly from  hyperglycemia.    Hypothyroidism:  Resume synthroid.   Severe protein calorie malnutrition:  nutritoin consulted.  PT evaluation.    GERD STABLE.    AKI: Resolved with hydration.    Left toe ulcer: afebrile, no leukocytosis,  Tender to touch.   Black eschar. Wound care consulted . Pedal pulses felt.  ABI ordered.    Anemia:  Baseline hemoglobin is around 9. On admission pt hemoglobin at 13, probably hemo concentrated.  Stable.    DVT prophylaxis: (Lovenox/) Code Status: (full code.  Family Communication: none at bedside.  Disposition Plan: possible home in am after ABI.    Consultants:   None.    Procedures: none.    Antimicrobials: levaquin changed to oral doxycycline.    Subjective: Feeling better today. No chest pain or sob. No nausea, vomiting or dysuria.  Reports pain in the left toe.   Objective: Vitals:   05/25/17 1410 05/25/17 2120 05/26/17 0541 05/26/17 1400  BP: (!) 95/50 (!) 106/52 (!) 108/55 (!) 90/41  Pulse: 86 92 (!) 59 80  Resp: 20 18 18 16   Temp: 97.7 F (36.5 C) 97.9 F (36.6 C) 98 F (36.7 C) (!) 97.4 F (36.3 C)  TempSrc: Oral Oral Oral Oral  SpO2: 100% 98% 99% 99%  Weight:      Height:        Intake/Output Summary (Last 24 hours) at 05/26/2017 1707 Last data filed at 05/25/2017 1800 Gross per 24 hour  Intake 400 ml  Output 200 ml  Net 200 ml   Filed Weights   05/23/17 1941 05/24/17 0014  Weight:  34.6 kg (76 lb 4.5 oz) 35.1 kg (77 lb 6.1 oz)    Examination:  General exam: Appears calm and comfortable , cachetic looking lady. Not in distress.  Respiratory system: Clear , no wheezing or rhonchi.  Cardiovascular system: S1 & S2 heard, RRR. No JVD, murmurs,No pedal edema. Gastrointestinal system: Abdomen is soft non tender non distended bowel sounds hears.  Central nervous system: Alert and oriented. No focal neurological deficits. Extremities: no pedal edema.  Skin: left toe black eschar.  Psychiatry:  Mood & affect  appropriate.     Data Reviewed: I have personally reviewed following labs and imaging studies  CBC: Recent Labs  Lab 05/23/17 2008 05/24/17 0438 05/26/17 0534  WBC 15.5* 11.7* 7.5  HGB 13.5 9.7* 9.4*  HCT 37.3 27.9* 26.1*  MCV 77.9* 78.8 78.9  PLT 510* 370 147   Basic Metabolic Panel: Recent Labs  Lab 05/24/17 0102 05/24/17 0438 05/24/17 1004 05/24/17 2322 05/26/17 0534  NA 138 138 134* 128* 136  K 5.2* 3.8 3.9 3.7 3.3*  CL 110 112* 108 102 108  CO2 11* 18* 17* 19* 21*  GLUCOSE 294* 107* 167* 491* 192*  BUN 12 11 10 7 7   CREATININE 1.18* 0.91 0.99 0.93 0.82  CALCIUM 8.7* 8.2* 8.1* 8.0* 7.7*   GFR: Estimated Creatinine Clearance: 37.9 mL/min (by C-G formula based on SCr of 0.82 mg/dL). Liver Function Tests: Recent Labs  Lab 05/23/17 2045  AST 14*  ALT 12*  ALKPHOS 124  BILITOT 1.0  PROT 8.2*  ALBUMIN 3.4*   Recent Labs  Lab 05/23/17 2045  LIPASE 214*   No results for input(s): AMMONIA in the last 168 hours. Coagulation Profile: No results for input(s): INR, PROTIME in the last 168 hours. Cardiac Enzymes: Recent Labs  Lab 05/23/17 2009 05/24/17 0135  CKTOTAL  --  41  TROPONINI <0.03 <0.03   BNP (last 3 results) No results for input(s): PROBNP in the last 8760 hours. HbA1C: No results for input(s): HGBA1C in the last 72 hours. CBG: Recent Labs  Lab 05/25/17 1636 05/25/17 2125 05/26/17 0743 05/26/17 1131 05/26/17 1634  GLUCAP 227* 82 198* 121* 105*   Lipid Profile: No results for input(s): CHOL, HDL, LDLCALC, TRIG, CHOLHDL, LDLDIRECT in the last 72 hours. Thyroid Function Tests: No results for input(s): TSH, T4TOTAL, FREET4, T3FREE, THYROIDAB in the last 72 hours. Anemia Panel: No results for input(s): VITAMINB12, FOLATE, FERRITIN, TIBC, IRON, RETICCTPCT in the last 72 hours. Sepsis Labs: Recent Labs  Lab 05/23/17 2058 05/24/17 0135  PROCALCITON  --  0.22  LATICACIDVEN 1.58  --     Recent Results (from the past 240 hour(s))    MRSA PCR Screening     Status: None   Collection Time: 05/24/17 12:42 AM  Result Value Ref Range Status   MRSA by PCR NEGATIVE NEGATIVE Final    Comment:        The GeneXpert MRSA Assay (FDA approved for NASAL specimens only), is one component of a comprehensive MRSA colonization surveillance program. It is not intended to diagnose MRSA infection nor to guide or monitor treatment for MRSA infections. Performed at [facility], [place] [address], [place], [place] [address]   Culture, blood (x 2)     Status: None (Preliminary result)   Collection Time: 05/24/17  1:35 AM  Result Value Ref Range Status   Specimen Description   Final    BLOOD RIGHT HAND Performed at [facility] [address] [place],  [place] [address]    Special Requests   Final    BOTTLES DRAWN AEROBIC ONLY BCAV Performed at [facility], [place] [address] [place], [place] [address]    Culture   Final    NO GROWTH 2 DAYS Performed at [facility] [address], [place], [place] [address]    Report Status PENDING  Incomplete  Culture, blood (x 2)     Status: None (Preliminary result)   Collection Time: 05/24/17  1:35 AM  Result Value Ref Range Status   Specimen Description   Final    BLOOD LEFT HAND Performed at [facility] [address] [place], [place] [address]    Special Requests   Final    BOTTLES DRAWN AEROBIC AND ANAEROBIC Blood Culture adequate volume Performed at [facility] [address], [place], [place] [address]    Culture   Final    NO GROWTH 2 DAYS Performed at [facility] [address] [place], [place] [address]    Report Status PENDING  Incomplete  Urine culture     Status: Abnormal   Collection Time: 05/24/17  2:57 AM  Result Value Ref Range Status   Specimen Description   Final    URINE, RANDOM Performed at [facility] [address]  [place], [place] [address]    Special Requests   Final    NONE Performed at [facility], [place] [address], [place], [place] [address]    Culture >=100,000 COLONIES/mL STAPHYLOCOCCUS EPIDERMIDIS (A)  Final   Report Status 05/26/2017 FINAL  Final   Organism ID, Bacteria STAPHYLOCOCCUS EPIDERMIDIS (A)  Final      Susceptibility   Staphylococcus epidermidis - MIC*    CIPROFLOXACIN 4 RESISTANT Resistant     GENTAMICIN >=16 RESISTANT Resistant     NITROFURANTOIN <=16 SENSITIVE Sensitive     OXACILLIN >=4 RESISTANT Resistant     TETRACYCLINE <=1 SENSITIVE Sensitive     VANCOMYCIN 1 SENSITIVE Sensitive     TRIMETH/SULFA <=10 SENSITIVE Sensitive     CLINDAMYCIN <=0.25 SENSITIVE Sensitive     RIFAMPIN <=0.5 SENSITIVE Sensitive     Inducible Clindamycin NEGATIVE Sensitive     * >=100,000 COLONIES/mL STAPHYLOCOCCUS EPIDERMIDIS         Radiology Studies: No results found.      Scheduled Meds: . aspirin  325 mg Oral Daily  . doxycycline  100 mg Oral Q12H  . enoxaparin (LOVENOX) injection  30 mg Subcutaneous QHS  . feeding supplement (ENSURE ENLIVE)  237 mL Oral BID BM  . ferrous sulfate  325 mg Oral Daily  . insulin aspart  0-5 Units Subcutaneous QHS  . insulin aspart  0-9 Units Subcutaneous TID WC  . insulin glargine  6 Units Subcutaneous Daily  . levothyroxine  100 mcg Oral QAC breakfast  . mouth rinse  15 mL Mouth Rinse BID  . megestrol  400 mg Oral BID  . metoCLOPramide (REGLAN) injection  5 mg Intravenous Q8H  . multivitamin with minerals  1 tablet Oral Daily  . polyethylene glycol  17 g Oral Daily  . potassium chloride  40 mEq Oral BID  . senna-docusate  1 tablet Oral BID   Continuous Infusions:   LOS: 3 days    Time spent: 35 minutes.     Hosie Poisson, MD [facility] Pager [phone]  If 7PM-7AM, please contact night-coverage [email or web address] Password [facility] 05/26/2017, 5:07 PM

## 2017-05-27 ENCOUNTER — Encounter: Payer: Medicare Other | Admitting: Nutrition

## 2017-05-27 ENCOUNTER — Inpatient Hospital Stay (HOSPITAL_COMMUNITY): Payer: Medicare Other

## 2017-05-27 ENCOUNTER — Ambulatory Visit: Payer: Medicare Other

## 2017-05-27 ENCOUNTER — Ambulatory Visit: Payer: Medicare Other | Admitting: Internal Medicine

## 2017-05-27 ENCOUNTER — Other Ambulatory Visit: Payer: Medicare Other

## 2017-05-27 DIAGNOSIS — L0390 Cellulitis, unspecified: Secondary | ICD-10-CM

## 2017-05-27 LAB — GLUCOSE, CAPILLARY
GLUCOSE-CAPILLARY: 236 mg/dL — AB (ref 65–99)
GLUCOSE-CAPILLARY: 241 mg/dL — AB (ref 65–99)
GLUCOSE-CAPILLARY: 172 mg/dL — AB (ref 65–99)
Glucose-Capillary: 129 mg/dL — ABNORMAL HIGH (ref 65–99)

## 2017-05-27 LAB — BASIC METABOLIC PANEL
Anion gap: 7 (ref 5–15)
BUN: 11 mg/dL (ref 6–20)
CHLORIDE: 107 mmol/L (ref 101–111)
CO2: 21 mmol/L — AB (ref 22–32)
CREATININE: 0.9 mg/dL (ref 0.44–1.00)
Calcium: 7.8 mg/dL — ABNORMAL LOW (ref 8.9–10.3)
GFR calc non Af Amer: >60 mL/min (ref >60)
Glucose, Bld: 269 mg/dL — ABNORMAL HIGH (ref 65–99)
POTASSIUM: 3.7 mmol/L (ref 3.5–5.1)
SODIUM: 135 mmol/L (ref 135–145)

## 2017-05-27 MED ORDER — INSULIN GLARGINE 100 UNIT/ML ~~LOC~~ SOLN
8.0000 [IU] | Freq: Every day | SUBCUTANEOUS | Status: DC
  Administered 2017-05-28 – 2017-05-31 (×3): 8 [IU] via SUBCUTANEOUS
  Filled 2017-05-27 (×4): qty 0.08

## 2017-05-27 MED ORDER — SODIUM CHLORIDE 0.9 % IV BOLUS
1000.0000 mL | Freq: Once | INTRAVENOUS | Status: AC
  Administered 2017-05-27: 1000 mL via INTRAVENOUS

## 2017-05-27 NOTE — Care Management Important Message (Signed)
Important Message  Patient Details  Name: Aiya Keach MRN: [id number] Date of Birth: 23-May-1950   Medicare Important Message Given:  Yes    Kerin Salen 05/27/2017, 11:11 AMImportant Message  Patient Details  Name: Denitra Donaghey MRN: [id number] Date of Birth: [birth year]   Medicare Important Message Given:  Yes    Kerin Salen 05/27/2017, 11:11 AM

## 2017-05-27 NOTE — Progress Notes (Signed)
Physical Therapy Treatment Patient Details Name: Brittany Mercado MRN: [id number] DOB: 09-17-1950 Today's Date: 05/27/2017    History of Present Illness Brittany Mercado is a 67 y.o. 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 11.19, recently diag type 2 DM. Hypertension, hypothyroidism, was brought in by family for hyperglycemia and confusion.    PT Comments    Pt progressing well, pt reports she does not  Want to go to rehab but is agreeable to RW and HHPT  Follow Up Recommendations  Home health PT(pt reports she was receiving HHPT prior to adm(?))     Equipment Recommendations  Rolling walker with 5" wheels(pt is agreeable)    Recommendations for Other Services       Precautions / Restrictions Precautions Precautions: Fall Precaution Comments: pt reports h/o unsteady gait but denies falls  Restrictions Weight Bearing Restrictions: No    Mobility  Bed Mobility Overal bed mobility: Independent                Transfers Overall transfer level: Modified independent                  Ambulation/Gait Ambulation/Gait assistance: Supervision Ambulation Distance (Feet): 80 Feet Assistive device: Rolling walker (2 wheeled) Gait Pattern/deviations: Step-through pattern     General Gait Details: pt steady with RW, no LOB today   Stairs            Wheelchair Mobility    Modified Rankin (Stroke Patients Only)       Balance     Sitting balance-Leahy Scale: Good       Standing balance-Leahy Scale: Fair Standing balance comment: pt is able to maintain static without support, able to reach 4-6" outside BOS with min/guard, requires UE support for dynamic activity, unable to tolerate challenges                            Cognition Arousal/Alertness: Awake/alert Behavior During Therapy: WFL for tasks assessed/performed Overall Cognitive  Status: Within Functional Limits for tasks assessed                                        Exercises      General Comments        Pertinent Vitals/Pain Pain Assessment: No/denies pain    Home Living                      Prior Function            PT Goals (current goals can now be found in the care plan section) Acute Rehab PT Goals Patient Stated Goal: return home PT Goal Formulation: With patient Time For Goal Achievement: 06/08/17 Potential to Achieve Goals: Good Progress towards PT goals: Progressing toward goals    Frequency    Min 3X/week      PT Plan Current plan remains appropriate    Co-evaluation              AM-PAC PT "6 Clicks" Daily Activity  Outcome Measure  Difficulty turning over in bed (including adjusting bedclothes, sheets and blankets)?: None Difficulty moving from lying on back to sitting on the side of the bed? : None Difficulty sitting down on and standing up from a chair with  arms (e.g., wheelchair, bedside commode, etc,.)?: None Help needed moving to and from a bed to chair (including a wheelchair)?: None Help needed walking in hospital room?: None Help needed climbing 3-5 steps with a railing? : A Little 6 Click Score: 23    End of Session   Activity Tolerance: Patient tolerated treatment well Patient left: in bed;with call bell/phone within reach Nurse Communication: Mobility status PT Visit Diagnosis: Unsteadiness on feet (R26.81);Difficulty in walking, not elsewhere classified (R26.2)     Time: 1540-1600 PT Time Calculation (min) (ACUTE ONLY): 20 min  Charges:  $Gait Training: 8-22 mins                    G CodesKenyon Ana, PT Pager: [phone] 05/27/2017    Kenyon Ana 05/27/2017, 4:10 PM

## 2017-05-27 NOTE — Telephone Encounter (Signed)
Called the son Kasandra Knudsen at [phone] The patient needs to go to Rehab 24 hours a day and states he can no longer continue to care for her due to his job and taking care of his family. Going in and out of the hospital is not helping---states she does so much better while in the hospital. But once at home (she lives with him and his family) her health just declines.   Advise on what steps he needs to take to begin this process

## 2017-05-27 NOTE — Consult Note (Signed)
[facility] Nurse wound consult note Reason for Consult: right 5th toe discolored  Wound type: unclear etiology Pressure Injury POA: Yes Measurement: Entire right 5th toe The right 5th toe is discolored dark purple.  There are no open wounds.  The foot is warm and dry.  There is a palpable dorsalis pedis pulse to the foot.  There is also a dry, crusted area to the right great toe tip.  Care for the area includes:  Apply betadine to the right 5th toe and between toes 4 and 5.  Let air dry.  Do NOT apply any type of bandage that will hold moisture. Protect the area from injury. Thank you for the consult.  Discussed plan of care with the patient and bedside nurse.  [facility] nurse will not follow at this time.  Please re-consult the [facility] team if needed.  Val Riles, RN, MSN, CWOCN, CNS-BC, pager [phone]

## 2017-05-27 NOTE — Progress Notes (Addendum)
PROGRESS NOTE    Brittany Mercado  UEA:[id number] DOB: [birth year] DOA: 05/23/2017 PCP: [name], DO    Brief Narrative:  67 y.o.femalewith medical history significant of metastatic NSCLC on chemotherapy of Keytruda w/ last tx 3/11, HTN, DM type II, hypothyroidism, tobacco abuse; who presented to the emergency department with complaints of worsening generalized weakness, anorexia, and elevated blood sugars. Patient was just hospitalized from 3/13-3/15, after being found to be in DKA with acute metabolic encephalopathy and meeting SIRS criteria without any clear source of infection found. Patient was placed on glucose stabilizer protocol and switch back to home regimen with blood sugars noted to be stable. Since being home patient reports having poor appetite, but reports taking all medications including diabetic medication as prescribed. Despite poor appetite and reportedly taking insulin at home her blood sugars have remained elevated. This admission she was found to be in DKA, with unclear etiology.     Assessment & Plan:   Principal Problem:   DKA (diabetic ketoacidoses) ([place]) Active Problems:   Adenocarcinoma of right lung, stage 4 (HCC)   HTN (hypertension)   Iron deficiency anemia due to chronic blood loss   Diabetes mellitus type 2 in nonobese (HCC)   Severe protein-calorie malnutrition (HCC)   AKI (acute kidney injury) ([place])   SIRS (systemic inflammatory response syndrome) (HCC)   GERD (gastroesophageal reflux disease)   Hypothyroidism   DKA, type 2 (HCC)   Toe ulcer ([place])   DKA of unclear etiology, ? Staph UTI:  Resolved now.  Her cbgs' are slowing increasing.   CBG (last 3)  Recent Labs    05/27/17 0729 05/27/17 1136 05/27/17 1701  GLUCAP 236* 241* 129*   Now as the patient's appetite is increasing, will increase her insulin dose.     Staph UTI: levaquin changed to doxycycline to complete the course.    Hyponatremia: possibly from  hyperglycemia. Resolved with resolution of DKA.    Hypothyroidism:  Resume synthroid.   Severe protein calorie malnutrition:  nutritoin consulted.  PT evaluation,, pt and family wants to know if she can go to rehab/ SNF.     GERD STABLE.    AKI: Resolved with hydration.    Left toe ulcer: afebrile, no leukocytosis,  Tender to touch.   Black eschar. Wound care consulted . Pedal pulses felt.  ABI ordered. SHOWED bilateral severe peripheral arterial disease. Will get vascular surgery consult to see if she is a candidate for invasive procedures.    Hypotension:  Unclear etiology. Not on any meds.  Will get orthostatic vital signs.  Fluid bolus and repeat BP .   Anemia:  Baseline hemoglobin is around 9. On admission pt hemoglobin at 13, probably hemo concentrated.  Stable.    DVT prophylaxis: (Lovenox/) Code Status: (full code.  Family Communication: none at bedside.  Disposition Plan: possible home in am after ABI.    Consultants:   None.    Procedures: none.    Antimicrobials: levaquin changed to oral doxycycline.    Subjective: Slightly dizzy today. No chest pain or sob.   Objective: Vitals:   05/26/17 1400 05/26/17 2144 05/27/17 0541 05/27/17 1514  BP: (!) 90/41 (!) 94/52 (!) 90/53 (!) 116/57  Pulse: 80 91 90 97  Resp: 16 17 16 18   Temp: (!) 97.4 F (36.3 C) 97.8 F (36.6 C) 98.2 F (36.8 C) 98.2 F (36.8 C)  TempSrc: Oral Oral Oral Oral  SpO2: 99% 98% 100% 98%  Weight:      Height:  Intake/Output Summary (Last 24 hours) at 05/27/2017 2017 Last data filed at 05/27/2017 1248 Gross per 24 hour  Intake 360 ml  Output -  Net 360 ml   Filed Weights   05/23/17 1941 05/24/17 0014  Weight: 34.6 kg (76 lb 4.5 oz) 35.1 kg (77 lb 6.1 oz)    Examination:  General exam: Appears calm and comfortable , cachetic looking lady. Not in distress.  Respiratory system: Clear , no wheezing or rhonchi.  Cardiovascular system: S1 & S2 heard, RRR. No JVD,  murmurs,No pedal edema. Gastrointestinal system: Abdomen is soft non tender non distended bowel sounds hears.  Central nervous system: Alert and oriented. No focal neurological deficits. Extremities: no pedal edema.  Skin: 5 TH right toe black eschar.  Psychiatry:  Mood & affect appropriate.     Data Reviewed: I have personally reviewed following labs and imaging studies  CBC: Recent Labs  Lab 05/23/17 2008 05/24/17 0438 05/26/17 0534  WBC 15.5* 11.7* 7.5  HGB 13.5 9.7* 9.4*  HCT 37.3 27.9* 26.1*  MCV 77.9* 78.8 78.9  PLT 510* 370 195   Basic Metabolic Panel: Recent Labs  Lab 05/24/17 0438 05/24/17 1004 05/24/17 2322 05/26/17 0534 05/27/17 0537  NA 138 134* 128* 136 135  K 3.8 3.9 3.7 3.3* 3.7  CL 112* 108 102 108 107  CO2 18* 17* 19* 21* 21*  GLUCOSE 107* 167* 491* 192* 269*  BUN 11 10 7 7 11   CREATININE 0.91 0.99 0.93 0.82 0.90  CALCIUM 8.2* 8.1* 8.0* 7.7* 7.8*   GFR: Estimated Creatinine Clearance: 34.5 mL/min (by C-G formula based on SCr of 0.9 mg/dL). Liver Function Tests: Recent Labs  Lab 05/23/17 2045  AST 14*  ALT 12*  ALKPHOS 124  BILITOT 1.0  PROT 8.2*  ALBUMIN 3.4*   Recent Labs  Lab 05/23/17 2045  LIPASE 214*   No results for input(s): AMMONIA in the last 168 hours. Coagulation Profile: No results for input(s): INR, PROTIME in the last 168 hours. Cardiac Enzymes: Recent Labs  Lab 05/23/17 2009 05/24/17 0135  CKTOTAL  --  41  TROPONINI <0.03 <0.03   BNP (last 3 results) No results for input(s): PROBNP in the last 8760 hours. HbA1C: No results for input(s): HGBA1C in the last 72 hours. CBG: Recent Labs  Lab 05/26/17 1634 05/26/17 2142 05/27/17 0729 05/27/17 1136 05/27/17 1701  GLUCAP 105* 298* 236* 241* 129*   Lipid Profile: No results for input(s): CHOL, HDL, LDLCALC, TRIG, CHOLHDL, LDLDIRECT in the last 72 hours. Thyroid Function Tests: No results for input(s): TSH, T4TOTAL, FREET4, T3FREE, THYROIDAB in the last 72  hours. Anemia Panel: No results for input(s): VITAMINB12, FOLATE, FERRITIN, TIBC, IRON, RETICCTPCT in the last 72 hours. Sepsis Labs: Recent Labs  Lab 05/23/17 2058 05/24/17 0135  PROCALCITON  --  0.22  LATICACIDVEN 1.58  --     Recent Results (from the past 240 hour(s))  MRSA PCR Screening     Status: None   Collection Time: 05/24/17 12:42 AM  Result Value Ref Range Status   MRSA by PCR NEGATIVE NEGATIVE Final    Comment:        The GeneXpert MRSA Assay (FDA approved for NASAL specimens only), is one component of a comprehensive MRSA colonization surveillance program. It is not intended to diagnose MRSA infection nor to guide or monitor treatment for MRSA infections. Performed at [facility], [place] [address], [place], [place] [address]   Culture, blood (x 2)  Status: None (Preliminary result)   Collection Time: 05/24/17  1:35 AM  Result Value Ref Range Status   Specimen Description   Final    BLOOD RIGHT HAND Performed at [facility] [address] [place], [place] [address]    Special Requests   Final    BOTTLES DRAWN AEROBIC ONLY BCAV Performed at [facility] [address] [place], [place] [address]    Culture   Final    NO GROWTH 3 DAYS Performed at [facility], [place] [address], [place], [place] [address]    Report Status PENDING  Incomplete  Culture, blood (x 2)     Status: None (Preliminary result)   Collection Time: 05/24/17  1:35 AM  Result Value Ref Range Status   Specimen Description   Final    BLOOD LEFT HAND Performed at [facility] [address] [place], [place] [address]    Special Requests   Final    BOTTLES DRAWN AEROBIC AND ANAEROBIC Blood Culture adequate volume Performed at [facility] [address], [place], [place] [address]    Culture   Final    NO GROWTH 3 DAYS Performed at [facility], [place] [address]  [place], [place] [address]    Report Status PENDING  Incomplete  Urine culture     Status: Abnormal   Collection Time: 05/24/17  2:57 AM  Result Value Ref Range Status   Specimen Description   Final    URINE, RANDOM Performed at [facility] [address] [place], [place] [address]    Special Requests   Final    NONE Performed at [facility], [place] [address], [place], [place] [address]    Culture >=100,000 COLONIES/mL STAPHYLOCOCCUS EPIDERMIDIS (A)  Final   Report Status 05/26/2017 FINAL  Final   Organism ID, Bacteria STAPHYLOCOCCUS EPIDERMIDIS (A)  Final      Susceptibility   Staphylococcus epidermidis - MIC*    CIPROFLOXACIN 4 RESISTANT Resistant     GENTAMICIN >=16 RESISTANT Resistant     NITROFURANTOIN <=16 SENSITIVE Sensitive     OXACILLIN >=4 RESISTANT Resistant     TETRACYCLINE <=1 SENSITIVE Sensitive     VANCOMYCIN 1 SENSITIVE Sensitive     TRIMETH/SULFA <=10 SENSITIVE Sensitive     CLINDAMYCIN <=0.25 SENSITIVE Sensitive     RIFAMPIN <=0.5 SENSITIVE Sensitive     Inducible Clindamycin NEGATIVE Sensitive     * >=100,000 COLONIES/mL STAPHYLOCOCCUS EPIDERMIDIS         Radiology Studies: No results found.      Scheduled Meds: . aspirin  325 mg Oral Daily  . doxycycline  100 mg Oral Q12H  . enoxaparin (LOVENOX) injection  30 mg Subcutaneous QHS  . feeding supplement (ENSURE ENLIVE)  237 mL Oral BID BM  . ferrous sulfate  325 mg Oral Daily  . insulin aspart  0-5 Units Subcutaneous QHS  . insulin aspart  0-9 Units Subcutaneous TID WC  . insulin glargine  6 Units Subcutaneous Daily  . levothyroxine  100 mcg Oral QAC breakfast  . mouth rinse  15 mL Mouth Rinse BID  . megestrol  400 mg Oral BID  . metoCLOPramide (REGLAN) injection  5 mg Intravenous Q8H  . multivitamin with minerals  1 tablet Oral Daily  . polyethylene glycol  17 g Oral Daily  . senna-docusate  1 tablet Oral BID   Continuous Infusions:   LOS: 4 days  Time spent: 35 minutes.     Hosie Poisson, MD [facility] Pager [phone]  If 7PM-7AM, please contact night-coverage [email or web address] Password [facility] 05/27/2017, 8:17 PM

## 2017-05-27 NOTE — Telephone Encounter (Signed)
Called and spoke with son, Kasandra Knudsen regarding issue.  I did state that we could do home health, however it may be worth while discussing with the medical team at the hospital to have a social worker come and speak with her regarding her case.  This would help facilitate a smooth discharge plan if she is to go somewhere other than home when she leaves the hospital.  He voiced understanding and agreement to the plan.

## 2017-05-27 NOTE — Progress Notes (Signed)
*  PRELIMINARY RESULTS* Vascular Ultrasound  Right ABI 0.36  Left ABI 0.29   HONGYING  Sarahmarie Leavey(RDMS RVT) 05/27/2017, 11:09 AM

## 2017-05-28 DIAGNOSIS — L97519 Non-pressure chronic ulcer of other part of right foot with unspecified severity: Secondary | ICD-10-CM

## 2017-05-28 DIAGNOSIS — I70261 Atherosclerosis of native arteries of extremities with gangrene, right leg: Secondary | ICD-10-CM

## 2017-05-28 LAB — GLUCOSE, CAPILLARY
GLUCOSE-CAPILLARY: 310 mg/dL — AB (ref 65–99)
Glucose-Capillary: 206 mg/dL — ABNORMAL HIGH (ref 65–99)
Glucose-Capillary: 175 mg/dL — ABNORMAL HIGH (ref 65–99)
Glucose-Capillary: 173 mg/dL — ABNORMAL HIGH (ref 65–99)

## 2017-05-28 MED ORDER — INSULIN ASPART 100 UNIT/ML ~~LOC~~ SOLN
2.0000 [IU] | Freq: Three times a day (TID) | SUBCUTANEOUS | Status: DC
  Administered 2017-05-28 – 2017-05-30 (×4): 2 [IU] via SUBCUTANEOUS

## 2017-05-28 NOTE — Consult Note (Signed)
Patient name: Brittany Mercado MRN: [id number] DOB: 03-25-1950 Sex: female   REASON FOR CONSULT:    Nonhealing wounds right foot with abnormal ABIs.  The consult is requested by Dr. Karleen Hampshire.  HPI:   Brittany Mercado is a pleasant 67 y.o. female, who was admitted on 05/24/2017 with weakness.  She tells me that the main issue has been hypoglycemia.  She states that she had previously been hospitalized with similar issues and was in the intensive care unit.  At that time they were having a difficult time getting a pulse ox reading on her fingertips and therefore a pulse ox monitor was placed on her right fifth toe.  She states that she noticed the wound on her foot subsequent to this.  Prior to being in the hospital she does admit to significant bilateral lower extremity calf claudication that occurs at a very short distance of about 10-15 feet.  She also describes rest pain in both feet but especially on the right.  She also has paresthesias in her right foot.  Her activity is fairly limited she feels that her symptoms have been stable over the last several months.  She denies any history of fever or chills. Of note she does have a history of stage IV Non-small cell lung cancer and is on chemotherapy.  Her risk factors for peripheral vascular disease include diabetes, hypertension, hypercholesterolemia, and a history of tobacco use although she quit in the remote past.  Past Medical History:  Diagnosis Date  . Adenocarcinoma of right lung, stage 4 ([place]) 11/30/2015  . Adult failure to thrive   . Complication of anesthesia    hard to wake up  . Diabetes mellitus type 2 in nonobese (HCC)   . Eczema   . Encounter for antineoplastic chemotherapy 11/30/2015  . Heart murmur    told at age 68 but never has had any problems  . History of radiation therapy 01/26/16-02/07/16   right pelvis 30 Gy in 10 fractions  . HTN (hypertension) 02/01/2016  . 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 Iron deficiency anemia  due to chronic blood loss 12/10/2016    Family History  Problem Relation Age of Onset  . Colon cancer Neg Hx   . Esophageal cancer Neg Hx   . Rectal cancer Neg Hx   . Stomach cancer Neg Hx     SOCIAL HISTORY: Social History   Socioeconomic History  . Marital status: Single    Spouse name: Not on file  . Number of children: Not on file  . Years of education: Not on file  . Highest education level: Not on file  Occupational History  . Occupation: retired - childcare  Social Needs  . Financial resource strain: Not on file  . Food insecurity:    Worry: Not on file    Inability: Not on file  . Transportation needs:    Medical: Not on file    Non-medical: Not on file  Tobacco Use  . Smoking status: Former Smoker    Packs/day: 0.50    Years: 30.00    Pack years: 15.00    Last attempt to quit: 11/01/2015    Years since quitting: 1.5  . Smokeless tobacco: Never Used  Substance and Sexual Activity  . Alcohol use: No  . Drug use: No  . Sexual activity: Not Currently    Birth control/protection: Surgical  Lifestyle  . Physical activity:    Days per week: Not on file    Minutes per session:  Not on file  . Stress: Not on file  Relationships  . Social connections:    Talks on phone: Not on file    Gets together: Not on file    Attends religious service: Not on file    Active member of club or organization: Not on file    Attends meetings of clubs or organizations: Not on file    Relationship status: Not on file  . Intimate partner violence:    Fear of current or ex partner: Not on file    Emotionally abused: Not on file    Physically abused: Not on file    Forced sexual activity: Not on file  Other Topics Concern  . Not on file  Social History Narrative  . Not on file    Allergies  Allergen Reactions  . Penicillins Swelling    SWELLING REACTION UNSPECIFIED  Has patient had a PCN reaction causing immediate rash, facial/tongue/throat swelling, SOB or  lightheadedness with hypotension: Yes Has patient had a PCN reaction causing severe rash involving mucus membranes or skin necrosis: No Has patient had a PCN reaction that required hospitalization No Has patient had a PCN reaction occurring within the last 10 years: No If all of the above answers are "NO", then may proceed with Cephalosporin use.    Current Facility-Administered Medications  Medication Dose Route Frequency Provider Last Rate Last Dose  . acetaminophen (TYLENOL) tablet 650 mg  650 mg Oral Q6H PRN Hosie Poisson, MD       Or  . acetaminophen (TYLENOL) suppository 650 mg  650 mg Rectal Q6H PRN Hosie Poisson, MD      . aspirin tablet 325 mg  325 mg Oral Daily Hosie Poisson, MD   325 mg at 05/28/17 0951  . bisacodyl (DULCOLAX) suppository 10 mg  10 mg Rectal Daily PRN Hosie Poisson, MD      . dextrose 50 % solution 25 mL  25 mL Intravenous PRN Hosie Poisson, MD   25 mL at 05/24/17 2304  . doxycycline (VIBRA-TABS) tablet 100 mg  100 mg Oral Q12H Hosie Poisson, MD   100 mg at 05/28/17 0951  . enoxaparin (LOVENOX) injection 30 mg  30 mg Subcutaneous QHS Hosie Poisson, MD   30 mg at 05/27/17 2221  . famotidine (PEPCID) tablet 10 mg  10 mg Oral Daily PRN Hosie Poisson, MD   10 mg at 05/24/17 1038  . feeding supplement (ENSURE ENLIVE) (ENSURE ENLIVE) liquid 237 mL  237 mL Oral BID BM Hosie Poisson, MD   237 mL at 05/28/17 1012  . fentaNYL (SUBLIMAZE) injection 12.5 mcg  12.5 mcg Intravenous Q2H PRN Hosie Poisson, MD      . ferrous sulfate tablet 325 mg  325 mg Oral Daily Hosie Poisson, MD   325 mg at 05/28/17 1127  . insulin aspart (novoLOG) injection 0-5 Units  0-5 Units Subcutaneous QHS Hosie Poisson, MD   3 Units at 05/26/17 2151  . insulin aspart (novoLOG) injection 0-9 Units  0-9 Units Subcutaneous TID WC Hosie Poisson, MD   3 Units at 05/28/17 1127  . insulin glargine (LANTUS) injection 8 Units  8 Units Subcutaneous Daily Hosie Poisson, MD   8 Units at 05/28/17 0950  .  levothyroxine (SYNTHROID, LEVOTHROID) tablet 100 mcg  100 mcg Oral QAC breakfast Hosie Poisson, MD   100 mcg at 05/28/17 0951  . MEDLINE mouth rinse  15 mL Mouth Rinse BID Hosie Poisson, MD   15 mL at 05/27/17 2226  . megestrol (  MEGACE) 400 MG/10ML suspension 400 mg  400 mg Oral BID Hosie Poisson, MD   400 mg at 05/28/17 0950  . metoCLOPramide (REGLAN) injection 5 mg  5 mg Intravenous Q8H Hosie Poisson, MD   5 mg at 05/28/17 0609  . multivitamin with minerals tablet 1 tablet  1 tablet Oral Daily Hosie Poisson, MD   1 tablet at 05/28/17 0951  . ondansetron (ZOFRAN) tablet 4 mg  4 mg Oral Q6H PRN Hosie Poisson, MD       Or  . ondansetron (ZOFRAN) injection 4 mg  4 mg Intravenous Q6H PRN Hosie Poisson, MD   4 mg at 05/25/17 1140  . polyethylene glycol (MIRALAX / GLYCOLAX) packet 17 g  17 g Oral Daily Hosie Poisson, MD   17 g at 05/26/17 1245  . senna-docusate (Senokot-S) tablet 1 tablet  1 tablet Oral BID Hosie Poisson, MD   1 tablet at 05/26/17 1245    REVIEW OF SYSTEMS:  [X]  denotes positive finding, [ ]  denotes negative finding Cardiac  Comments:  Chest pain or chest pressure:    Shortness of breath upon exertion:    Short of breath when lying flat:    Irregular heart rhythm:        Vascular    Pain in calf, thigh, or hip brought on by ambulation: x   Pain in feet at night that wakes you up from your sleep:  x   Blood clot in your veins:    Leg swelling:         Pulmonary    Oxygen at home:    Productive cough:     Wheezing:         Neurologic    Sudden weakness in arms or legs:     Sudden numbness in arms or legs:     Sudden onset of difficulty speaking or slurred speech:    Temporary loss of vision in one eye:     Problems with dizziness:         Gastrointestinal    Blood in stool:     Vomited blood:         Genitourinary    Burning when urinating:     Blood in urine:        Psychiatric    Major depression:         Hematologic    Bleeding problems:    Problems  with blood clotting too easily:        Skin    Rashes or ulcers: x       Constitutional    Fever or chills:     PHYSICAL EXAM:   Vitals:   05/27/17 1514 05/27/17 2205 05/27/17 2359 05/28/17 0607  BP: (!) 116/57 (!) 100/46 (!) 120/48 (!) 103/43  Pulse: 97 87  93  Resp: 18 18  20   Temp: 98.2 F (36.8 C) 98.4 F (36.9 C)  (!) 97.5 F (36.4 C)  TempSrc: Oral Oral  Oral  SpO2: 98%   100%  Weight:      Height:        GENERAL: The patient is a poorly-nourished female, in no acute distress. The vital signs are documented above. CARDIAC: There is a regular rate and rhythm.  VASCULAR: I do not detect carotid bruits. She has palpable femoral pulses bilaterally. I cannot palpate popliteal or pedal pulses. She has a monophasic dorsalis pedis and posterior tibial signal bilaterally. She has no significant lower extremity swelling. PULMONARY: There is good air  exchange bilaterally without wheezing or rales. ABDOMEN: Soft and non-tender with normal pitched bowel sounds.  MUSCULOSKELETAL: There are no major deformities or cyanosis. NEUROLOGIC: No focal weakness or paresthesias are detected. SKIN:   She has dry gangrene of the right fifth toe and also an eschar on the medial aspect of the right great toe. PSYCHIATRIC: The patient has a normal affect.  DATA:     ABIS: I have independently interpreted her noninvasive arterial Doppler study.  This shows an ABI of 36% on the right  and 29% on the left.  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  She has a nonhealing wound on the right fifth toe.  I have explained that without revascularization I do not think that this will heal.  Nor would she have adequate circulation to heal a toe amputation.  I think that her best chance for limb salvage would be to proceed with arteriography to see if she might be a  candidate for an endovascular approach or potentially a bypass.  Certainly she is at increased risk given her stage IV lung CA and severe protein calorie malnutrition.  However she feels strongly about trying to save leg if at all possible.  Currently the wound does not appear to be a source of sepsis and she simply has dry gangrene of the toe.  Thus if she is not a candidate for revascularization I do not think that anything needs to be done urgently as ultimately she will likely require a primary amputation. She would require a right below the knee or above the knee amputation.  I have reviewed with the patient the indications for arteriography. In addition, I have reviewed the potential complications of arteriography including but not limited to: Bleeding, arterial injury, arterial thrombosis, dye action, renal insufficiency, or other unpredictable medical problems. I have explained to the patient that if we find disease amenable to angioplasty we could potentially address this at the same time. I have discussed the potential complications of angioplasty and stenting, including but not limited to: Bleeding, arterial thrombosis, arterial injury, dissection, or the need for surgical intervention.   We have added onto the schedule at [facility] tomorrow and I have discussed this with Dr.  Karleen Hampshire who will have the patient transferred to [facility] today.  She can come back to [place] long after the procedure.   Deitra Mayo [facility] [phone]

## 2017-05-28 NOTE — Progress Notes (Signed)
PROGRESS NOTE    Brittany Mercado  QIW:[id number] DOB: 1950/09/11 DOA: 05/23/2017 PCP: [name], DO    Brief Narrative:  67 y.o.femalewith medical history significant of metastatic NSCLC on chemotherapy of Keytruda w/ last tx 3/11, HTN, DM type II, hypothyroidism, tobacco abuse; who presented to the emergency department with complaints of worsening generalized weakness, anorexia, and elevated blood sugars. Patient was just hospitalized from 3/13-3/15, after being found to be in DKA with acute metabolic encephalopathy and meeting SIRS criteria without any clear source of infection found. Patient was placed on glucose stabilizer protocol and switch back to home regimen with blood sugars noted to be stable. Since being home patient reports having poor appetite, but reports taking all medications including diabetic medication as prescribed. Despite poor appetite and reportedly taking insulin at home her blood sugars have remained elevated. This admission she was found to be in DKA, with unclear etiology.     Assessment & Plan:   Principal Problem:   DKA (diabetic ketoacidoses) ([place]) Active Problems:   Adenocarcinoma of right lung, stage 4 (HCC)   HTN (hypertension)   Iron deficiency anemia due to chronic blood loss   Diabetes mellitus type 2 in nonobese (HCC)   Severe protein-calorie malnutrition (HCC)   AKI (acute kidney injury) ([place])   SIRS (systemic inflammatory response syndrome) (HCC)   GERD (gastroesophageal reflux disease)   Hypothyroidism   DKA, type 2 (HCC)   Toe ulcer ([place])   DKA of unclear etiology, ? Staph UTI:  Resolved now.  Her cbgs' are slowing increasing.   CBG (last 3)  Recent Labs    05/27/17 2207 05/28/17 0805 05/28/17 1125  GLUCAP 172* 310* 206*   Now as the patient's appetite is increasing, will increase her insulin dose.  Add novolog 2 units of TIDAC.     Staph UTI: levaquin changed to doxycycline to complete the course.     Hyponatremia: possibly from hyperglycemia. Resolved with resolution of DKA.    Hypothyroidism:  Resume synthroid.   Severe protein calorie malnutrition:  nutritoin consulted.  PT evaluation,, pt and family wants to know if she can go to rehab/ SNF.     GERD STABLE.    AKI: Resolved with hydration.    Right toe  Gangrenous ulcer: afebrile, no leukocytosis,  Tender to touch.   Black eschar. Wound care consulted . Pedal pulses felt.  ABI ordered. SHOWED bilateral severe peripheral arterial disease. Vascular surgery consulted, plan for arteriogram today and procedure in the  Morning. Transfer the patient to [facility].   Hypotension:  Unclear etiology. Not on any meds.  Responded to fluid bolus, probably volume depleted.   Anemia:  Baseline hemoglobin is around 9. On admission pt hemoglobin at 13, probably hemo concentrated.  Hemoglobin stable around 9.     DVT prophylaxis: (Lovenox/) Code Status: (full code.  Family Communication: discussed with family  at bedside.  Disposition Plan: transfer to [facility] for arteriogram and possible angioplasty in am.    Consultants:   None.    Procedures: none.    Antimicrobials: levaquin changed to oral doxycycline.    Subjective: NO CHEST PAIN, sob, nausea, or vomiting.   Objective: Vitals:   05/27/17 1514 05/27/17 2205 05/27/17 2359 05/28/17 0607  BP: (!) 116/57 (!) 100/46 (!) 120/48 (!) 103/43  Pulse: 97 87  93  Resp: 18 18  20   Temp: 98.2 F (36.8 C) 98.4 F (36.9 C)  (!) 97.5 F (36.4 C)  TempSrc: Oral Oral  Oral  SpO2: 98%   100%  Weight:      Height:        Intake/Output Summary (Last 24 hours) at 05/28/2017 1430 Last data filed at 05/28/2017 1241 Gross per 24 hour  Intake 360 ml  Output -  Net 360 ml   Filed Weights   05/23/17 1941 05/24/17 0014  Weight: 34.6 kg (76 lb 4.5 oz) 35.1 kg (77 lb 6.1 oz)    Examination:  General exam: Appears calm and comfortable , cachetic looking lady. Not in distress.   Respiratory system: good air entry bilateral. No wheezing or rhonchi.  Cardiovascular system: S1 & S2 heard, RRR. No JVD, murmurs,No pedal edema. Gastrointestinal system: Abdomen is soft non tender non distended bowel sounds heard.  Central nervous system: Alert and oriented. No focal neurological deficits. Extremities: no pedal edema. No cyanosis.  Skin: 5 TH right toe black eschar.  Psychiatry:  Mood & affect appropriate.     Data Reviewed: I have personally reviewed following labs and imaging studies  CBC: Recent Labs  Lab 05/23/17 2008 05/24/17 0438 05/26/17 0534  WBC 15.5* 11.7* 7.5  HGB 13.5 9.7* 9.4*  HCT 37.3 27.9* 26.1*  MCV 77.9* 78.8 78.9  PLT 510* 370 093   Basic Metabolic Panel: Recent Labs  Lab 05/24/17 0438 05/24/17 1004 05/24/17 2322 05/26/17 0534 05/27/17 0537  NA 138 134* 128* 136 135  K 3.8 3.9 3.7 3.3* 3.7  CL 112* 108 102 108 107  CO2 18* 17* 19* 21* 21*  GLUCOSE 107* 167* 491* 192* 269*  BUN 11 10 7 7 11   CREATININE 0.91 0.99 0.93 0.82 0.90  CALCIUM 8.2* 8.1* 8.0* 7.7* 7.8*   GFR: Estimated Creatinine Clearance: 34.5 mL/min (by C-G formula based on SCr of 0.9 mg/dL). Liver Function Tests: Recent Labs  Lab 05/23/17 2045  AST 14*  ALT 12*  ALKPHOS 124  BILITOT 1.0  PROT 8.2*  ALBUMIN 3.4*   Recent Labs  Lab 05/23/17 2045  LIPASE 214*   No results for input(s): AMMONIA in the last 168 hours. Coagulation Profile: No results for input(s): INR, PROTIME in the last 168 hours. Cardiac Enzymes: Recent Labs  Lab 05/23/17 2009 05/24/17 0135  CKTOTAL  --  41  TROPONINI <0.03 <0.03   BNP (last 3 results) No results for input(s): PROBNP in the last 8760 hours. HbA1C: No results for input(s): HGBA1C in the last 72 hours. CBG: Recent Labs  Lab 05/27/17 1136 05/27/17 1701 05/27/17 2207 05/28/17 0805 05/28/17 1125  GLUCAP 241* 129* 172* 310* 206*   Lipid Profile: No results for input(s): CHOL, HDL, LDLCALC, TRIG, CHOLHDL,  LDLDIRECT in the last 72 hours. Thyroid Function Tests: No results for input(s): TSH, T4TOTAL, FREET4, T3FREE, THYROIDAB in the last 72 hours. Anemia Panel: No results for input(s): VITAMINB12, FOLATE, FERRITIN, TIBC, IRON, RETICCTPCT in the last 72 hours. Sepsis Labs: Recent Labs  Lab 05/23/17 2058 05/24/17 0135  PROCALCITON  --  0.22  LATICACIDVEN 1.58  --     Recent Results (from the past 240 hour(s))  MRSA PCR Screening     Status: None   Collection Time: 05/24/17 12:42 AM  Result Value Ref Range Status   MRSA by PCR NEGATIVE NEGATIVE Final    Comment:        The GeneXpert MRSA Assay (FDA approved for NASAL specimens only), is one component of a comprehensive MRSA colonization surveillance program. It is not intended to diagnose MRSA infection nor to guide or monitor treatment for MRSA  infections. Performed at [facility], [place] [address], [place], [place] [address]   Culture, blood (x 2)     Status: None (Preliminary result)   Collection Time: 05/24/17  1:35 AM  Result Value Ref Range Status   Specimen Description   Final    BLOOD RIGHT HAND Performed at [facility] [address] [place], [place] [address]    Special Requests   Final    BOTTLES DRAWN AEROBIC ONLY BCAV Performed at [facility] [address] [place], [place] [address]    Culture   Final    NO GROWTH 4 DAYS Performed at [facility], [place] [address], [place], [place] [address]    Report Status PENDING  Incomplete  Culture, blood (x 2)     Status: None (Preliminary result)   Collection Time: 05/24/17  1:35 AM  Result Value Ref Range Status   Specimen Description   Final    BLOOD LEFT HAND Performed at [facility] [address], [place], [place] [address]    Special Requests   Final    BOTTLES DRAWN AEROBIC AND ANAEROBIC Blood Culture adequate volume Performed at [facility]  [address] [place], [place] [address]    Culture   Final    NO GROWTH 4 DAYS Performed at [facility], [place] [address], [place], [place] [address]    Report Status PENDING  Incomplete  Urine culture     Status: Abnormal   Collection Time: 05/24/17  2:57 AM  Result Value Ref Range Status   Specimen Description   Final    URINE, RANDOM Performed at [facility] [address], [place], [place] [address]    Special Requests   Final    NONE Performed at [facility], [place] [address], [place],  [address]    Culture >=100,000 COLONIES/mL STAPHYLOCOCCUS EPIDERMIDIS (A)  Final   Report Status 05/26/2017 FINAL  Final   Organism ID, Bacteria STAPHYLOCOCCUS EPIDERMIDIS (A)  Final      Susceptibility   Staphylococcus epidermidis - MIC*    CIPROFLOXACIN 4 RESISTANT Resistant     GENTAMICIN >=16 RESISTANT Resistant     NITROFURANTOIN <=16 SENSITIVE Sensitive     OXACILLIN >=4 RESISTANT Resistant     TETRACYCLINE <=1 SENSITIVE Sensitive     VANCOMYCIN 1 SENSITIVE Sensitive     TRIMETH/SULFA <=10 SENSITIVE Sensitive     CLINDAMYCIN <=0.25 SENSITIVE Sensitive     RIFAMPIN <=0.5 SENSITIVE Sensitive     Inducible Clindamycin NEGATIVE Sensitive     * >=100,000 COLONIES/mL STAPHYLOCOCCUS EPIDERMIDIS         Radiology Studies: No results found.      Scheduled Meds: . aspirin  325 mg Oral Daily  . doxycycline  100 mg Oral Q12H  . enoxaparin (LOVENOX) injection  30 mg Subcutaneous QHS  . feeding supplement (ENSURE ENLIVE)  237 mL Oral BID BM  . ferrous sulfate  325 mg Oral Daily  . insulin aspart  0-5 Units Subcutaneous QHS  . insulin aspart  0-9 Units Subcutaneous TID WC  . insulin glargine  8 Units Subcutaneous Daily  . levothyroxine  100 mcg Oral QAC breakfast  . mouth rinse  15 mL Mouth Rinse BID  . megestrol  400 mg Oral BID  . metoCLOPramide (REGLAN) injection  5 mg Intravenous Q8H  . multivitamin with minerals  1 tablet Oral  Daily  .  polyethylene glycol  17 g Oral Daily  . senna-docusate  1 tablet Oral BID   Continuous Infusions:   LOS: 5 days    Time spent: 35 minutes.     Hosie Poisson, MD [facility] Pager [phone]  If 7PM-7AM, please contact night-coverage [email or web address] Password [email or web address] 05/28/2017, 2:30 PM

## 2017-05-28 NOTE — H&P (View-Only) (Signed)
Patient name: Brittany Mercado MRN: [id number] DOB: 03/16/1950 Sex: female   REASON FOR CONSULT:    Nonhealing wounds right foot with abnormal ABIs.  The consult is requested by Dr. Karleen Hampshire.  HPI:   Brittany Mercado is a pleasant 67 y.o. female, who was admitted on 05/24/2017 with weakness.  She tells me that the main issue has been hypoglycemia.  She states that she had previously been hospitalized with similar issues and was in the intensive care unit.  At that time they were having a difficult time getting a pulse ox reading on her fingertips and therefore a pulse ox monitor was placed on her right fifth toe.  She states that she noticed the wound on her foot subsequent to this.  Prior to being in the hospital she does admit to significant bilateral lower extremity calf claudication that occurs at a very short distance of about 10-15 feet.  She also describes rest pain in both feet but especially on the right.  She also has paresthesias in her right foot.  Her activity is fairly limited she feels that her symptoms have been stable over the last several months.  She denies any history of fever or chills. Of note she does have a history of stage IV Non-small cell lung cancer and is on chemotherapy.  Her risk factors for peripheral vascular disease include diabetes, hypertension, hypercholesterolemia, and a history of tobacco use although she quit in the remote past.  Past Medical History:  Diagnosis Date  . Adenocarcinoma of right lung, stage 4 ([place]) 11/30/2015  . Adult failure to thrive   . Complication of anesthesia    hard to wake up  . Diabetes mellitus type 2 in nonobese (HCC)   . Eczema   . Encounter for antineoplastic chemotherapy 11/30/2015  . Heart murmur    told at age 23 but never has had any problems  . History of radiation therapy 01/26/16-02/07/16   right pelvis 30 Gy in 10 fractions  . HTN (hypertension) 02/01/2016  . 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 Iron deficiency anemia  due to chronic blood loss 12/10/2016    Family History  Problem Relation Age of Onset  . Colon cancer Neg Hx   . Esophageal cancer Neg Hx   . Rectal cancer Neg Hx   . Stomach cancer Neg Hx     SOCIAL HISTORY: Social History   Socioeconomic History  . Marital status: Single    Spouse name: Not on file  . Number of children: Not on file  . Years of education: Not on file  . Highest education level: Not on file  Occupational History  . Occupation: retired - childcare  Social Needs  . Financial resource strain: Not on file  . Food insecurity:    Worry: Not on file    Inability: Not on file  . Transportation needs:    Medical: Not on file    Non-medical: Not on file  Tobacco Use  . Smoking status: Former Smoker    Packs/day: 0.50    Years: 30.00    Pack years: 15.00    Last attempt to quit: 11/01/2015    Years since quitting: 1.5  . Smokeless tobacco: Never Used  Substance and Sexual Activity  . Alcohol use: No  . Drug use: No  . Sexual activity: Not Currently    Birth control/protection: Surgical  Lifestyle  . Physical activity:    Days per week: Not on file    Minutes per session:  Not on file  . Stress: Not on file  Relationships  . Social connections:    Talks on phone: Not on file    Gets together: Not on file    Attends religious service: Not on file    Active member of club or organization: Not on file    Attends meetings of clubs or organizations: Not on file    Relationship status: Not on file  . Intimate partner violence:    Fear of current or ex partner: Not on file    Emotionally abused: Not on file    Physically abused: Not on file    Forced sexual activity: Not on file  Other Topics Concern  . Not on file  Social History Narrative  . Not on file    Allergies  Allergen Reactions  . Penicillins Swelling    SWELLING REACTION UNSPECIFIED  Has patient had a PCN reaction causing immediate rash, facial/tongue/throat swelling, SOB or  lightheadedness with hypotension: Yes Has patient had a PCN reaction causing severe rash involving mucus membranes or skin necrosis: No Has patient had a PCN reaction that required hospitalization No Has patient had a PCN reaction occurring within the last 10 years: No If all of the above answers are "NO", then may proceed with Cephalosporin use.    Current Facility-Administered Medications  Medication Dose Route Frequency Provider Last Rate Last Dose  . acetaminophen (TYLENOL) tablet 650 mg  650 mg Oral Q6H PRN Hosie Poisson, MD       Or  . acetaminophen (TYLENOL) suppository 650 mg  650 mg Rectal Q6H PRN Hosie Poisson, MD      . aspirin tablet 325 mg  325 mg Oral Daily Hosie Poisson, MD   325 mg at 05/28/17 0951  . bisacodyl (DULCOLAX) suppository 10 mg  10 mg Rectal Daily PRN Hosie Poisson, MD      . dextrose 50 % solution 25 mL  25 mL Intravenous PRN Hosie Poisson, MD   25 mL at 05/24/17 2304  . doxycycline (VIBRA-TABS) tablet 100 mg  100 mg Oral Q12H Hosie Poisson, MD   100 mg at 05/28/17 0951  . enoxaparin (LOVENOX) injection 30 mg  30 mg Subcutaneous QHS Hosie Poisson, MD   30 mg at 05/27/17 2221  . famotidine (PEPCID) tablet 10 mg  10 mg Oral Daily PRN Hosie Poisson, MD   10 mg at 05/24/17 1038  . feeding supplement (ENSURE ENLIVE) (ENSURE ENLIVE) liquid 237 mL  237 mL Oral BID BM Hosie Poisson, MD   237 mL at 05/28/17 1012  . fentaNYL (SUBLIMAZE) injection 12.5 mcg  12.5 mcg Intravenous Q2H PRN Hosie Poisson, MD      . ferrous sulfate tablet 325 mg  325 mg Oral Daily Hosie Poisson, MD   325 mg at 05/28/17 1127  . insulin aspart (novoLOG) injection 0-5 Units  0-5 Units Subcutaneous QHS Hosie Poisson, MD   3 Units at 05/26/17 2151  . insulin aspart (novoLOG) injection 0-9 Units  0-9 Units Subcutaneous TID WC Hosie Poisson, MD   3 Units at 05/28/17 1127  . insulin glargine (LANTUS) injection 8 Units  8 Units Subcutaneous Daily Hosie Poisson, MD   8 Units at 05/28/17 0950  .  levothyroxine (SYNTHROID, LEVOTHROID) tablet 100 mcg  100 mcg Oral QAC breakfast Hosie Poisson, MD   100 mcg at 05/28/17 0951  . MEDLINE mouth rinse  15 mL Mouth Rinse BID Hosie Poisson, MD   15 mL at 05/27/17 2226  . megestrol (  MEGACE) 400 MG/10ML suspension 400 mg  400 mg Oral BID Hosie Poisson, MD   400 mg at 05/28/17 0950  . metoCLOPramide (REGLAN) injection 5 mg  5 mg Intravenous Q8H Hosie Poisson, MD   5 mg at 05/28/17 0609  . multivitamin with minerals tablet 1 tablet  1 tablet Oral Daily Hosie Poisson, MD   1 tablet at 05/28/17 0951  . ondansetron (ZOFRAN) tablet 4 mg  4 mg Oral Q6H PRN Hosie Poisson, MD       Or  . ondansetron (ZOFRAN) injection 4 mg  4 mg Intravenous Q6H PRN Hosie Poisson, MD   4 mg at 05/25/17 1140  . polyethylene glycol (MIRALAX / GLYCOLAX) packet 17 g  17 g Oral Daily Hosie Poisson, MD   17 g at 05/26/17 1245  . senna-docusate (Senokot-S) tablet 1 tablet  1 tablet Oral BID Hosie Poisson, MD   1 tablet at 05/26/17 1245    REVIEW OF SYSTEMS:  [X]  denotes positive finding, [ ]  denotes negative finding Cardiac  Comments:  Chest pain or chest pressure:    Shortness of breath upon exertion:    Short of breath when lying flat:    Irregular heart rhythm:        Vascular    Pain in calf, thigh, or hip brought on by ambulation: x   Pain in feet at night that wakes you up from your sleep:  x   Blood clot in your veins:    Leg swelling:         Pulmonary    Oxygen at home:    Productive cough:     Wheezing:         Neurologic    Sudden weakness in arms or legs:     Sudden numbness in arms or legs:     Sudden onset of difficulty speaking or slurred speech:    Temporary loss of vision in one eye:     Problems with dizziness:         Gastrointestinal    Blood in stool:     Vomited blood:         Genitourinary    Burning when urinating:     Blood in urine:        Psychiatric    Major depression:         Hematologic    Bleeding problems:    Problems  with blood clotting too easily:        Skin    Rashes or ulcers: x       Constitutional    Fever or chills:     PHYSICAL EXAM:   Vitals:   05/27/17 1514 05/27/17 2205 05/27/17 2359 05/28/17 0607  BP: (!) 116/57 (!) 100/46 (!) 120/48 (!) 103/43  Pulse: 97 87  93  Resp: 18 18  20   Temp: 98.2 F (36.8 C) 98.4 F (36.9 C)  (!) 97.5 F (36.4 C)  TempSrc: Oral Oral  Oral  SpO2: 98%   100%  Weight:      Height:        GENERAL: The patient is a poorly-nourished female, in no acute distress. The vital signs are documented above. CARDIAC: There is a regular rate and rhythm.  VASCULAR: I do not detect carotid bruits. She has palpable femoral pulses bilaterally. I cannot palpate popliteal or pedal pulses. She has a monophasic dorsalis pedis and posterior tibial signal bilaterally. She has no significant lower extremity swelling. PULMONARY: There is good air  exchange bilaterally without wheezing or rales. ABDOMEN: Soft and non-tender with normal pitched bowel sounds.  MUSCULOSKELETAL: There are no major deformities or cyanosis. NEUROLOGIC: No focal weakness or paresthesias are detected. SKIN:   She has dry gangrene of the right fifth toe and also an eschar on the medial aspect of the right great toe. PSYCHIATRIC: The patient has a normal affect.  DATA:     ABIS: I have independently interpreted her noninvasive arterial Doppler study.  This shows an ABI of 36% on the right  and 29% on the left.  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  She has a nonhealing wound on the right fifth toe.  I have explained that without revascularization I do not think that this will heal.  Nor would she have adequate circulation to heal a toe amputation.  I think that her best chance for limb salvage would be to proceed with arteriography to see if she might be a  candidate for an endovascular approach or potentially a bypass.  Certainly she is at increased risk given her stage IV lung CA and severe protein calorie malnutrition.  However she feels strongly about trying to save leg if at all possible.  Currently the wound does not appear to be a source of sepsis and she simply has dry gangrene of the toe.  Thus if she is not a candidate for revascularization I do not think that anything needs to be done urgently as ultimately she will likely require a primary amputation. She would require a right below the knee or above the knee amputation.  I have reviewed with the patient the indications for arteriography. In addition, I have reviewed the potential complications of arteriography including but not limited to: Bleeding, arterial injury, arterial thrombosis, dye action, renal insufficiency, or other unpredictable medical problems. I have explained to the patient that if we find disease amenable to angioplasty we could potentially address this at the same time. I have discussed the potential complications of angioplasty and stenting, including but not limited to: Bleeding, arterial thrombosis, arterial injury, dissection, or the need for surgical intervention.   We have added onto the schedule at [facility] tomorrow and I have discussed this with Dr.  Karleen Hampshire who will have the patient transferred to [facility] today.  She can come back to [place] long after the procedure.   Deitra Mayo [facility] [phone]

## 2017-05-29 ENCOUNTER — Ambulatory Visit: Payer: Medicare Other | Admitting: Family Medicine

## 2017-05-29 ENCOUNTER — Inpatient Hospital Stay (HOSPITAL_COMMUNITY)
Admission: EM | Disposition: A | Discharge status: Home Health | Payer: Self-pay | Source: Home / Self Care | Attending: Internal Medicine

## 2017-05-29 DIAGNOSIS — E119 Type 2 diabetes mellitus without complications: Secondary | ICD-10-CM

## 2017-05-29 HISTORY — PX: PERIPHERAL VASCULAR ATHERECTOMY: CATH118256

## 2017-05-29 HISTORY — PX: ABDOMINAL AORTOGRAM W/LOWER EXTREMITY: CATH118223

## 2017-05-29 LAB — GLUCOSE, CAPILLARY
Glucose-Capillary: 313 mg/dL — ABNORMAL HIGH (ref 65–99)
Glucose-Capillary: 208 mg/dL — ABNORMAL HIGH (ref 65–99)
Glucose-Capillary: 266 mg/dL — ABNORMAL HIGH (ref 65–99)
Glucose-Capillary: 152 mg/dL — ABNORMAL HIGH (ref 65–99)
Glucose-Capillary: 399 mg/dL — ABNORMAL HIGH (ref 65–99)

## 2017-05-29 LAB — CULTURE, BLOOD (ROUTINE X 2)
Culture: NO GROWTH
Culture: NO GROWTH
SPECIAL REQUESTS: ADEQUATE

## 2017-05-29 LAB — POCT ACTIVATED CLOTTING TIME
ACTIVATED CLOTTING TIME: 213 s
Activated Clotting Time: 191 seconds
Activated Clotting Time: 169 s
Activated Clotting Time: 191 seconds

## 2017-05-29 SURGERY — ABDOMINAL AORTOGRAM W/LOWER EXTREMITY
Anesthesia: LOCAL

## 2017-05-29 MED ORDER — HEPARIN SODIUM (PORCINE) 1000 UNIT/ML IJ SOLN
INTRAMUSCULAR | Status: AC
  Filled 2017-05-29: qty 1

## 2017-05-29 MED ORDER — ASPIRIN EC 81 MG PO TBEC
81.0000 mg | DELAYED_RELEASE_TABLET | Freq: Every day | ORAL | Status: DC
  Administered 2017-05-30 – 2017-05-31 (×2): 81 mg via ORAL
  Filled 2017-05-29 (×2): qty 1

## 2017-05-29 MED ORDER — CLOPIDOGREL BISULFATE 75 MG PO TABS
75.0000 mg | ORAL_TABLET | Freq: Every day | ORAL | Status: DC
  Administered 2017-05-29 – 2017-05-31 (×3): 75 mg via ORAL
  Filled 2017-05-29 (×3): qty 1

## 2017-05-29 MED ORDER — ACETAMINOPHEN 325 MG PO TABS: 650.0000 mg | ORAL_TABLET | ORAL | Status: DC | PRN

## 2017-05-29 MED ORDER — MIDAZOLAM HCL 2 MG/2ML IJ SOLN
INTRAMUSCULAR | Status: AC
  Filled 2017-05-29: qty 2

## 2017-05-29 MED ORDER — FENTANYL CITRATE (PF) 100 MCG/2ML IJ SOLN
INTRAMUSCULAR | Status: AC
  Filled 2017-05-29: qty 2

## 2017-05-29 MED ORDER — FENTANYL CITRATE (PF) 100 MCG/2ML IJ SOLN
INTRAMUSCULAR | Status: DC | PRN
  Administered 2017-05-29 (×4): 25 ug via INTRAVENOUS

## 2017-05-29 MED ORDER — MORPHINE SULFATE (PF) 2 MG/ML IV SOLN: 2.0000 mg | INTRAVENOUS | Status: DC | PRN

## 2017-05-29 MED ORDER — HEPARIN (PORCINE) IN NACL 2-0.9 UNIT/ML-% IJ SOLN
INTRAMUSCULAR | Status: AC
  Filled 2017-05-29: qty 500

## 2017-05-29 MED ORDER — SODIUM CHLORIDE 0.9 % IV SOLN: 250.0000 mL | INTRAVENOUS | Status: DC | PRN

## 2017-05-29 MED ORDER — MIDAZOLAM HCL 2 MG/2ML IJ SOLN
INTRAMUSCULAR | Status: DC | PRN
  Administered 2017-05-29 (×4): 1 mg via INTRAVENOUS

## 2017-05-29 MED ORDER — HYDRALAZINE HCL 20 MG/ML IJ SOLN: 5.0000 mg | INTRAMUSCULAR | Status: DC | PRN

## 2017-05-29 MED ORDER — SODIUM CHLORIDE 0.9% FLUSH: 3.0000 mL | INTRAVENOUS | Status: DC | PRN

## 2017-05-29 MED ORDER — HEPARIN (PORCINE) IN NACL 2-0.9 UNIT/ML-% IJ SOLN
INTRAMUSCULAR | Status: DC | PRN
  Administered 2017-05-29: 1000 mL via INTRA_ARTERIAL

## 2017-05-29 MED ORDER — ANGIOPLASTY BOOK
Freq: Once | Status: DC
  Filled 2017-05-29: qty 1

## 2017-05-29 MED ORDER — LABETALOL HCL 5 MG/ML IV SOLN: 10.0000 mg | INTRAVENOUS | Status: DC | PRN

## 2017-05-29 MED ORDER — ONDANSETRON HCL 4 MG/2ML IJ SOLN: 4.0000 mg | Freq: Four times a day (QID) | INTRAMUSCULAR | Status: DC | PRN

## 2017-05-29 MED ORDER — SODIUM CHLORIDE 0.9% FLUSH
3.0000 mL | Freq: Two times a day (BID) | INTRAVENOUS | Status: DC
  Administered 2017-05-30 – 2017-05-31 (×4): 3 mL via INTRAVENOUS

## 2017-05-29 MED ORDER — IODIXANOL 320 MG/ML IV SOLN
INTRAVENOUS | Status: DC | PRN
  Administered 2017-05-29: 160 mL via INTRAVENOUS

## 2017-05-29 MED ORDER — LIDOCAINE HCL 1 % IJ SOLN
INTRAMUSCULAR | Status: AC
  Filled 2017-05-29: qty 20

## 2017-05-29 MED ORDER — LIDOCAINE HCL (PF) 1 % IJ SOLN
INTRAMUSCULAR | Status: DC | PRN
  Administered 2017-05-29: 15 mL

## 2017-05-29 MED ORDER — SODIUM CHLORIDE 0.9 % WEIGHT BASED INFUSION: 1.0000 mL/kg/h | INTRAVENOUS | Status: AC

## 2017-05-29 MED ORDER — HEPARIN SODIUM (PORCINE) 1000 UNIT/ML IJ SOLN
INTRAMUSCULAR | Status: DC | PRN
  Administered 2017-05-29: 4000 [IU] via INTRAVENOUS
  Administered 2017-05-29: 1500 [IU] via INTRAVENOUS

## 2017-05-29 SURGICAL SUPPLY — 25 items
BAG SNAP BAND KOVER 36X36 (MISCELLANEOUS) ×3 IMPLANT
BALLN LUTONIX 4X150X130 (BALLOONS) ×6
BALLN STERLING OTW 4X60X135 (BALLOONS) ×3
BALLOON LUTONIX 4X150X130 (BALLOONS) ×4 IMPLANT
BALLOON STERLING OTW 4X60X135 (BALLOONS) ×2 IMPLANT
BUR JETSTREAM XC 2.1/3.0 (BURR) ×2 IMPLANT
BURR JETSTREAM XC 2.1/3.0 (BURR) ×3
CATH OMNI FLUSH 5F 65CM (CATHETERS) ×3 IMPLANT
CATH QUICKCROSS SUPP .035X90CM (MICROCATHETER) ×3 IMPLANT
COVER DOME SNAP 22 D (MISCELLANEOUS) ×3 IMPLANT
COVER PRB 48X5XTLSCP FOLD TPE (BAG) ×2 IMPLANT
COVER PROBE 5X48 (BAG) ×1
DEVICE EMBOSHIELD NAV6 4.0-7.0 (FILTER) ×3 IMPLANT
GUIDEWIRE ANGLED .035X150CM (WIRE) ×3 IMPLANT
KIT ENCORE 26 ADVANTAGE (KITS) ×3 IMPLANT
KIT MICROPUNCTURE NIT STIFF (SHEATH) ×3 IMPLANT
KIT PV (KITS) ×3 IMPLANT
LUBRICANT VIPERSLIDE CORONARY (MISCELLANEOUS) ×3 IMPLANT
SHEATH AVANTI 11CM 5FR (SHEATH) ×3 IMPLANT
SHEATH PINNACLE MP 7F 45CM (SHEATH) ×3 IMPLANT
SYR MEDRAD MARK V 150ML (SYRINGE) ×3 IMPLANT
TRANSDUCER W/STOPCOCK (MISCELLANEOUS) ×3 IMPLANT
TRAY PV CATH (CUSTOM PROCEDURE TRAY) ×3 IMPLANT
WIRE BAREWIRE WORK .014X315CM (WIRE) ×3 IMPLANT
WIRE BENTSON .035X145CM (WIRE) ×3 IMPLANT

## 2017-05-29 NOTE — Interval H&P Note (Signed)
History and Physical Interval Note:  05/29/2017 1:20 PM  Brittany Mercado  has presented today for surgery, with the diagnosis of pvd ulcer  The various methods of treatment have been discussed with the patient and family. After consideration of risks, benefits and other options for treatment, the patient has consented to  Procedure(s): ABDOMINAL AORTOGRAM W/LOWER EXTREMITY (N/A) as a surgical intervention .  The patient's history has been reviewed, patient examined, no change in status, stable for surgery.  I have reviewed the patient's chart and labs.  Questions were answered to the patient's satisfaction.     Annamarie Major

## 2017-05-29 NOTE — Progress Notes (Signed)
Site area: left groin fa sheath Site Prior to Removal:  Level 0 Pressure Applied For: 25 minutes Manual:   yes Patient Status During Pull:  stable Post Pull Site:  Level 0 Post Pull Instructions Given:  yes Post Pull Pulses Present: dopplered left peroneal Dressing Applied:  Gauze and tegaderm Bedrest begins @ 1700 Comments:

## 2017-05-29 NOTE — Progress Notes (Signed)
PT Cancellation Note  Patient Details Name: Brittany Mercado MRN: [id number] DOB: [birth year]   Cancelled Treatment:    Reason Eval/Treat Not Completed: Medical issues which prohibited therapy Unable to work with patient today, out of room/in cath lab and per order set will be on bedrest until later this evening. Plan to attempt to return on next day of service.    Deniece Ree PT, DPT, CBIS  Supplemental Physical Therapist [facility]   Pager [phone]

## 2017-05-29 NOTE — Progress Notes (Signed)
Progress Note    Brittany Mercado  YCX:[id number] DOB: 08/07/1950  DOA: 05/23/2017 PCP: [name], DO    Brief Narrative:   Chief complaint: F/U dry gangrene/DKA  Medical records reviewed and are as summarized below:  Brittany Mercado is an 67 y.o. female with a PMH of metastatic non-small cell lung cancer on chemotherapy with Beryle Flock, last treatment 05/06/17, hypertension, type 2 diabetes, hypothyroidism and ongoing tobacco abuse who was admitted 05/23/17 for evaluation of generalized weakness, anorexia, and elevated blood glucoses. She was treated for DKA. During the course of her hospitalization, she developed a wound on her right fifth toe and also reported lower extremity calf claudication prompting a vascular surgery evaluation. This point, arteriography with possible angioplasty is scheduled.  Assessment/Plan:   Principal Problems:   DKA (diabetic ketoacidoses) ([place]) in a patient with type 2 diabetes Resolved. Currently being managed with 8 units of Lantus, insulin sensitive SSI with 2 units of meal coverage. CBGs 173-313. Will avoid adjusting insulin while nothing by mouth in anticipation of procedure today. Will need adjustment of insulin postoperatively to achieve better glycemic control.  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 Continue aspirin.    Staph epidermidis UTI  Has been on doxycycline since 05/25/17. Treatment course should be adequate. Discontinue doxycycline.    Adenocarcinoma of right lung, stage 4 ([place]) Patient is under the care of Dr. [name] at the cancer center. She has known metastasis to the bone. She is currently undergoing treatment with Ketruda 200 mg IV every 3 weeks, status post 23 cycles, with the last cycle having been given 05/06/17.    HTN (hypertension) Blood pressure currently controlled.    Iron  deficiency anemia due to chronic blood loss Hemoglobin stable. Continue iron supplementation.    AKI (acute kidney injury) ([place]) vs Stage III CKD Creatinine stable, GFR consistent with stage III chronic kidney disease.    GERD (gastroesophageal reflux disease) Continue Pepcid.    Hypothyroidism Continue Synthroid.    Severe protein-calorie malnutrition (HCC) Body mass index is 16.07 kg/m. Continue ensure supplements and Megace.   Family Communication/Anticipated D/C date and plan/Code Status   DVT prophylaxis: Lovenox ordered. Code Status: Full Code.  Family Communication: No family at the bedside. Disposition Plan: Home vs. SNF depending on progress and mobility post procedure.   Medical Consultants:    None.   Anti-Infectives:    Doxycycline 05/25/17---> 05/29/17  Subjective:   Sitting up in chair. +pain and numbness in her foot, no dyspnea, nausea or vomiting.  Objective:    Vitals:   05/28/17 0607 05/28/17 1906 05/28/17 2114 05/29/17 0512  BP: (!) 103/43 (!) 116/51 (!) 112/47 (!) 116/52  Pulse: 93 80 91 79  Resp: 20 18 17 17   Temp: (!) 97.5 F (36.4 C) 97.9 F (36.6 C) 98.2 F (36.8 C) 97.7 F (36.5 C)  TempSrc: Oral Oral Oral Oral  SpO2: 100%  100% 100%  Weight:  41.1 kg (90 lb 11.2 oz)    Height:  5\' 3"  (1.6 m)      Intake/Output Summary (Last 24 hours) at 05/29/2017 0854 Last data filed at 05/28/2017 1241 Gross per 24 hour  Intake 120 ml  Output -  Net 120 ml   Filed Weights   05/23/17 1941 05/24/17 0014 05/28/17 1906  Weight: 34.6 kg (76 lb 4.5 oz) 35.1 kg (77 lb 6.1 oz) 41.1 kg (  90 lb 11.2 oz)    Exam: General: Frail thin female in no acute distress. Cardiovascular: Heart sounds show a regular rate, and rhythm. No gallops or rubs. No murmurs. No JVD. Lungs: Clear to auscultation bilaterally with decreased breath sounds. No rales, rhonchi or wheezes. Abdomen: Soft, nontender, nondistended with normal active bowel sounds. No masses. No  hepatosplenomegaly. Neurological: Alert and oriented 3. Moves all extremities 4 with equal strength. Cranial nerves II through XII grossly intact. Skin: Warm and dry. No rashes or lesions. Extremities: No clubbing or cyanosis. No edema. Eschar to the right great toe and dry gangrene of the right fifth toe. Unable to palpate pedal pulses. Psychiatric: Mood and affect are normal. Insight and judgment are fair.   Data Reviewed:   I have personally reviewed following labs and imaging studies:  Labs: Labs show the following:   Basic Metabolic Panel: Recent Labs  Lab 05/24/17 0438 05/24/17 1004 05/24/17 2322 05/26/17 0534 05/27/17 0537  NA 138 134* 128* 136 135  K 3.8 3.9 3.7 3.3* 3.7  CL 112* 108 102 108 107  CO2 18* 17* 19* 21* 21*  GLUCOSE 107* 167* 491* 192* 269*  BUN 11 10 7 7 11   CREATININE 0.91 0.99 0.93 0.82 0.90  CALCIUM 8.2* 8.1* 8.0* 7.7* 7.8*   GFR Estimated Creatinine Clearance: 40.4 mL/min (by C-G formula based on SCr of 0.9 mg/dL). Liver Function Tests: Recent Labs  Lab 05/23/17 2045  AST 14*  ALT 12*  ALKPHOS 124  BILITOT 1.0  PROT 8.2*  ALBUMIN 3.4*   Recent Labs  Lab 05/23/17 2045  LIPASE 214*    CBC: Recent Labs  Lab 05/23/17 2008 05/24/17 0438 05/26/17 0534  WBC 15.5* 11.7* 7.5  HGB 13.5 9.7* 9.4*  HCT 37.3 27.9* 26.1*  MCV 77.9* 78.8 78.9  PLT 510* 370 309   Cardiac Enzymes: Recent Labs  Lab 05/23/17 2009 05/24/17 0135  CKTOTAL  --  41  TROPONINI <0.03 <0.03   CBG: Recent Labs  Lab 05/28/17 0805 05/28/17 1125 05/28/17 1638 05/28/17 2141 05/29/17 0756  GLUCAP 310* 206* 175* 173* 313*   Sepsis Labs: Recent Labs  Lab 05/23/17 2008 05/23/17 2058 05/24/17 0135 05/24/17 0438 05/26/17 0534  PROCALCITON  --   --  0.22  --   --   WBC 15.5*  --   --  11.7* 7.5  LATICACIDVEN  --  1.58  --   --   --     Microbiology Recent Results (from the past 240 hour(s))  MRSA PCR Screening     Status: None   Collection Time:  05/24/17 12:42 AM  Result Value Ref Range Status   MRSA by PCR NEGATIVE NEGATIVE Final    Comment:        The GeneXpert MRSA Assay (FDA approved for NASAL specimens only), is one component of a comprehensive MRSA colonization surveillance program. It is not intended to diagnose MRSA infection nor to guide or monitor treatment for MRSA infections. Performed at [facility], [place] [address], [place], [place] [address]   Culture, blood (x 2)     Status: None (Preliminary result)   Collection Time: 05/24/17  1:35 AM  Result Value Ref Range Status   Specimen Description   Final    BLOOD RIGHT HAND Performed at [place] [address] [place], [place] [address]    Special Requests   Final    BOTTLES DRAWN AEROBIC ONLY BCAV Performed at [facility],  [place] [address], [place], [place] [address]    Culture   Final    NO GROWTH 4 DAYS Performed at [facility], [place] [address], [place], [place] [address]    Report Status PENDING  Incomplete  Culture, blood (x 2)     Status: None (Preliminary result)   Collection Time: 05/24/17  1:35 AM  Result Value Ref Range Status   Specimen Description   Final    BLOOD LEFT HAND Performed at [place] [address], [place], [place] [address]    Special Requests   Final    BOTTLES DRAWN AEROBIC AND ANAEROBIC Blood Culture adequate volume Performed at [place] [address], [place], [place] [address]    Culture   Final    NO GROWTH 4 DAYS Performed at [facility], [place] [address] [place], [place] [address]    Report Status PENDING  Incomplete  Urine culture     Status: Abnormal   Collection Time: 05/24/17  2:57 AM  Result Value Ref Range Status   Specimen Description   Final    URINE, RANDOM Performed at [place] [address], [place], [place] [address]    Special Requests   Final     NONE Performed at [facility], [place] [address] [place],  [address]    Culture >=100,000 COLONIES/mL STAPHYLOCOCCUS EPIDERMIDIS (A)  Final   Report Status 05/26/2017 FINAL  Final   Organism ID, Bacteria STAPHYLOCOCCUS EPIDERMIDIS (A)  Final      Susceptibility   Staphylococcus epidermidis - MIC*    CIPROFLOXACIN 4 RESISTANT Resistant     GENTAMICIN >=16 RESISTANT Resistant     NITROFURANTOIN <=16 SENSITIVE Sensitive     OXACILLIN >=4 RESISTANT Resistant     TETRACYCLINE <=1 SENSITIVE Sensitive     VANCOMYCIN 1 SENSITIVE Sensitive     TRIMETH/SULFA <=10 SENSITIVE Sensitive     CLINDAMYCIN <=0.25 SENSITIVE Sensitive     RIFAMPIN <=0.5 SENSITIVE Sensitive     Inducible Clindamycin NEGATIVE Sensitive     * >=100,000 COLONIES/mL STAPHYLOCOCCUS EPIDERMIDIS    Procedures and diagnostic studies:  No results found.  Medications:   . aspirin  325 mg Oral Daily  . doxycycline  100 mg Oral Q12H  . enoxaparin (LOVENOX) injection  30 mg Subcutaneous QHS  . feeding supplement (ENSURE ENLIVE)  237 mL Oral BID BM  . ferrous sulfate  325 mg Oral Daily  . insulin aspart  0-5 Units Subcutaneous QHS  . insulin aspart  0-9 Units Subcutaneous TID WC  . insulin aspart  2 Units Subcutaneous TID WC  . insulin glargine  8 Units Subcutaneous Daily  . levothyroxine  100 mcg Oral QAC breakfast  . mouth rinse  15 mL Mouth Rinse BID  . megestrol  400 mg Oral BID  . metoCLOPramide (REGLAN) injection  5 mg Intravenous Q8H  . multivitamin with minerals  1 tablet Oral Daily  . polyethylene glycol  17 g Oral Daily  . senna-docusate  1 tablet Oral BID   Continuous Infusions:   LOS: 6 days   Jacquelynn Cree  [facility] Pager [phone]. If unable to reach me by pager, please call my cell phone at [phone].  *Please refer to [email or web address], password [email or web address] to get updated schedule on who will round on this patient, as hospitalists switch teams weekly. If 7PM-7AM,  please contact night-coverage at [email or web address], password [email or web address] for any overnight needs.  05/29/2017, 8:54 AM

## 2017-05-29 NOTE — Op Note (Signed)
Patient name: Kseniya Grunden MRN: [id number] DOB: [birth year] Sex: female  05/29/2017 Pre-operative Diagnosis: Right foot ulcer Post-operative diagnosis:  Same Surgeon:  Annamarie Major Procedure Performed:  1.  Ultrasound-guided access, left femoral artery  2.  Abdominal aortogram  3.  Right lower extremity runoff  4.  Jetstream atherectomy, right superficial femoral artery  5.  Drug-coated balloon angioplasty right superficial femoral artery  6.  Conscious sedation (82 minutes)     Indications: The patient has a nonhealing wound on her right foot.  Preoperative ultrasound revealed severely diminished ABIs.  She is here today for further evaluation and possible intervention  Procedure:  The patient was identified in the holding area and taken to room 8.  The patient was then placed supine on the table and prepped and draped in the usual sterile fashion.  A time out was called.  Conscious sedation was administered with the use of IV fentanyl and Versed under continuous physician and nurse monitoring.  Heart rate, blood pressure, and oxygen saturations were continuously monitored.  Ultrasound was used to evaluate the left common femoral artery.  It was patent .  A digital ultrasound image was acquired.  A micropuncture needle was used to access the left common femoral artery under ultrasound guidance.  An 018 wire was advanced without resistance and a micropuncture sheath was placed.  The 018 wire was removed and a benson wire was placed.  The micropuncture sheath was exchanged for a 5 french sheath.  An omniflush catheter was advanced over the wire to the level of L-1.  An abdominal angiogram was obtained.  Next, using the omniflush catheter and a benson wire, the aortic bifurcation was crossed and the catheter was placed into theright external iliac artery and right runoff was obtained.    Findings:   Aortogram: No significant renal artery stenosis.  The infrarenal abdominal aorta is widely patent.   Bilateral common and external iliac arteries widely patent.  Right Lower Extremity: The right common femoral and profunda femoral artery widely patent.  The superficial femoral artery is patent proximally however it occluded and is diffusely diseased in its distal extent with reconstitution of the above-knee popliteal artery.  There is three-vessel runoff.  Current superficial femoral and tibial vessels are all small in caliber.  The superficial femoral artery measures approximately 4 mm.  The tibial vessels are less than 2 mm.  Left Lower Extremity: Not evaluated  Intervention:  After the above images were acquired the decision was made to proceed with intervention.  Over a Bentson wire, a 7 French 45 cm sheath was advanced into the right external iliac artery.  The patient was fully heparinized.  I used a 035 Glidewire and a 90 cm quick cross catheter to select the superficial femoral artery and then cross the occlusion.  Reentry was confirmed with the catheter in the above-knee popliteal artery and a contrast injection.  Next, a bare wire was placed followed by placement of a NAV 6 filter.  A jetstream 21-30 device was used to perform atherectomy in the superficial femoral artery.  This was done with blades down going down and back.  I then used overlapping 4 x 150 drug-coated Lutoni balloons.  Needs inflation was to nominal pressure for 2 minutes.  Completion imaging was then performed after removing the filter which had no debris within it.  There was in-line flow through the superficial femoral popliteal artery with preservation of three-vessel runoff.  There was a small non-flow-limiting dissection in  the distal superficial femoral artery.  I inserted a 4 x 60 balloon and performed a long inflation at low pressure for 3 minutes.  Completion imaging showed significant improvement of the dissection.  At this point catheters and wires were removed.  The sheath was withdrawn to the left external iliac artery  and the patient be taken to the holding area for sheath pull once her coagulation profile corrects.  Impression:  #1  Right superficial femoral artery occlusion successfully treated with jetstream atherectomy and drug-coated balloon angioplasty using a 4 mm balloon  #2  Three-vessel runoff  #3  Small in caliber superficial femoral, popliteal, and tibial vessels.    Theotis Burrow, M.D. [facility] Office: [phone] Pager:  [phone]

## 2017-05-30 ENCOUNTER — Encounter (HOSPITAL_COMMUNITY): Payer: Self-pay | Admitting: Surgery

## 2017-05-30 DIAGNOSIS — E1310 Other specified diabetes mellitus with ketoacidosis without coma: Secondary | ICD-10-CM

## 2017-05-30 LAB — BASIC METABOLIC PANEL
ANION GAP: 13 (ref 5–15)
BUN: 8 mg/dL (ref 6–20)
CO2: 19 mmol/L — AB (ref 22–32)
Calcium: 7.9 mg/dL — ABNORMAL LOW (ref 8.9–10.3)
Chloride: 102 mmol/L (ref 101–111)
Creatinine, Ser: 1.02 mg/dL — ABNORMAL HIGH (ref 0.44–1.00)
GFR, EST NON AFRICAN AMERICAN: 56 mL/min — AB (ref >60)
GLUCOSE: 384 mg/dL — AB (ref 65–99)
Potassium: 4.2 mmol/L (ref 3.5–5.1)
Sodium: 134 mmol/L — ABNORMAL LOW (ref 135–145)

## 2017-05-30 LAB — CBC
HEMATOCRIT: 24.5 % — AB (ref 36.0–46.0)
HEMOGLOBIN: 8.7 g/dL — AB (ref 12.0–15.0)
MCH: 27.8 pg (ref 26.0–34.0)
MCHC: 35.5 g/dL (ref 30.0–36.0)
MCV: 78.3 fL (ref 78.0–100.0)
Platelets: 348 10*3/uL (ref 150–400)
RBC: 3.13 MIL/uL — AB (ref 3.87–5.11)
RDW: 17 % — ABNORMAL HIGH (ref 11.5–15.5)
WBC: 8.9 10*3/uL (ref 4.0–10.5)

## 2017-05-30 LAB — GLUCOSE, CAPILLARY
GLUCOSE-CAPILLARY: 452 mg/dL — AB (ref 65–99)
GLUCOSE-CAPILLARY: 351 mg/dL — AB (ref 65–99)
Glucose-Capillary: 298 mg/dL — ABNORMAL HIGH (ref 65–99)
Glucose-Capillary: 177 mg/dL — ABNORMAL HIGH (ref 65–99)

## 2017-05-30 MED ORDER — INSULIN ASPART 100 UNIT/ML ~~LOC~~ SOLN
10.0000 [IU] | Freq: Once | SUBCUTANEOUS | Status: AC
  Administered 2017-05-30: 10 [IU] via SUBCUTANEOUS

## 2017-05-30 MED ORDER — INSULIN ASPART 100 UNIT/ML ~~LOC~~ SOLN
3.0000 [IU] | Freq: Three times a day (TID) | SUBCUTANEOUS | Status: DC
  Administered 2017-05-30 – 2017-05-31 (×3): 3 [IU] via SUBCUTANEOUS

## 2017-05-30 NOTE — Care Management Important Message (Signed)
Important Message  Patient Details  Name: Brittany Mercado MRN: [id number] Date of Birth: [birth year]   Medicare Important Message Given:  Yes    Eduard Penkala P [place] 05/30/2017, 3:45 PM

## 2017-05-30 NOTE — Progress Notes (Addendum)
PROGRESS NOTE    [name]  UEA:[id number] DOB: [birth year] DOA: 05/23/2017 PCP: Brittany Pal, DO   Brief Narrative: Brittany Mercado is a 67 y.o. female with a history of metastatic non-small cell lung cancer on chemotherapy with Keytruda, hypertension, type 2 diabetes, hypothyroidism, tobacco abuse.  She is in with generalized weakness and was found to be in DKA.  She was treated with an IV insulin with improvement. Vascular surgery consulted for dry gangrene with angioplasty/artherectomy   Assessment & Plan:   Principal Problem:   DKA (diabetic ketoacidoses) ([place]) Active Problems:   Adenocarcinoma of right lung, stage 4 (HCC)   HTN (hypertension)   Iron deficiency anemia due to chronic blood loss   Diabetes mellitus type 2 in nonobese (HCC)   Severe protein-calorie malnutrition ([place])   AKI (acute kidney injury) ([place])   SIRS (systemic inflammatory response syndrome) (HCC)   GERD (gastroesophageal reflux disease)   Hypothyroidism   DKA, type 2 ([place])   Toe ulcer ([place])   DKA Patient treated with IV insulin. Now resolved.  Diabetes mellitus, type 2 Insulin dependent. Uncontrolled. 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 -Vascular surgery recommendations  Staph epidermidis UTI Completed course of doxycycline  Adenocarcinoma of right lung, stage 4 Outpatient follow-up with oncology  Essential hypertension Well-controlled  Iron deficiency anemia Secondary to chronic blood loss.  Hemoglobin with slight drop status post procedure. -Continue iron supplementation  GERD -Continue Pepcid  Hypothyroidism -Continue Synthroid  Acute kidney injury Resolved with IV fluids.  Severe  protein calorie malnutrition Body mass index is 16.01 kg/m. -Continue Ensure and Megace    DVT prophylaxis: Lovenox Code Status:   Code Status: Full Code Family Communication: None at bedside Disposition Plan: Discharge home w/ [facility] likely in 24 hours pending vascular surgery recommendations and blood sugar control   Consultants:   Vascular surgery  Procedures:   4/3-jetstream atherectomy and drug-coated balloon angioplasty  Antimicrobials:  Doxycycline (3/30 >>4/3)   Subjective: Patient with hyperglycemia this morning.  Still has numbness in her right foot.  Objective: Vitals:   05/29/17 2200 05/30/17 0423 05/30/17 0700 05/30/17 1219  BP: (!) 120/36 (!) 137/35 (!) 134/41 (!) 117/49  Pulse:  97 93 97  Resp: 17 (!) 26  16  Temp:  97.6 F (36.4 C) 98.3 F (36.8 C) 98.7 F (37.1 C)  TempSrc:  Oral Oral Oral  SpO2:    100%  Weight:  41 kg (90 lb 6.2 oz)    Height:        Intake/Output Summary (Last 24 hours) at 05/30/2017 1438 Last data filed at 05/30/2017 0800 Gross per 24 hour  Intake 726.6 ml  Output 1600 ml  Net -873.4 ml   Filed Weights   05/24/17 0014 05/28/17 1906 05/30/17 0423  Weight: 35.1 kg (77 lb 6.1 oz) 41.1 kg (90 lb 11.2 oz) 41 kg (90 lb 6.2 oz)    Examination:  General exam: Appears calm and comfortable Respiratory system: Clear to auscultation. Respiratory effort normal. Cardiovascular system: S1 & S2 heard, RRR. No murmurs, rubs, gallops or clicks. Gastrointestinal system: Abdomen is nondistended, soft and nontender. No organomegaly or masses felt. Normal bowel sounds heard. Central nervous system: Alert and oriented. No focal neurological deficits. Extremities: No edema. No calf tenderness.  Right fifth toe with dry gangrene.  Right.  Great toe with callus on medial aspect Skin: No cyanosis. No rashes Psychiatry: Judgement and insight appear normal. Mood & affect appropriate.     Data Reviewed: I have personally reviewed following labs  and imaging studies  CBC: Recent Labs  Lab 05/23/17 2008 05/24/17 0438 05/26/17 0534 05/30/17 0258  WBC 15.5* 11.7* 7.5 8.9  HGB 13.5 9.7* 9.4* 8.7*  HCT 37.3 27.9* 26.1* 24.5*  MCV 77.9* 78.8 78.9 78.3  PLT 510* 370 309 073   Basic Metabolic Panel: Recent Labs  Lab 05/24/17 1004 05/24/17 2322 05/26/17 0534 05/27/17 0537 05/30/17 0258  NA 134* 128* 136 135 134*  K 3.9 3.7 3.3* 3.7 4.2  CL 108 102 108 107 102  CO2 17* 19* 21* 21* 19*  GLUCOSE 167* 491* 192* 269* 384*  BUN 10 7 7 11 8   CREATININE 0.99 0.93 0.82 0.90 1.02*  CALCIUM 8.1* 8.0* 7.7* 7.8* 7.9*   GFR: Estimated Creatinine Clearance: 35.6 mL/min (A) (by C-G formula based on SCr of 1.02 mg/dL (H)). Liver Function Tests: Recent Labs  Lab 05/23/17 2045  AST 14*  ALT 12*  ALKPHOS 124  BILITOT 1.0  PROT 8.2*  ALBUMIN 3.4*   Recent Labs  Lab 05/23/17 2045  LIPASE 214*   No results for input(s): AMMONIA in the last 168 hours. Coagulation Profile: No results for input(s): INR, PROTIME in the last 168 hours. Cardiac Enzymes: Recent Labs  Lab 05/23/17 2009 05/24/17 0135  CKTOTAL  --  41  TROPONINI <0.03 <0.03   BNP (last 3 results) No results for input(s): PROBNP in the last 8760 hours. HbA1C: No results for input(s): HGBA1C in the last 72 hours. CBG: Recent Labs  Lab 05/29/17 1529 05/29/17 1830 05/29/17 2202 05/30/17 0630 05/30/17 1243  GLUCAP 266* 152* 399* 452* 351*   Lipid Profile: No results for input(s): CHOL, HDL, LDLCALC, TRIG, CHOLHDL, LDLDIRECT in the last 72 hours. Thyroid Function Tests: No results for input(s): TSH, T4TOTAL, FREET4, T3FREE, THYROIDAB in the last 72 hours. Anemia Panel: No results for input(s): VITAMINB12, FOLATE, FERRITIN, TIBC, IRON, RETICCTPCT in the last 72 hours. Sepsis Labs: Recent Labs  Lab 05/23/17 2058 05/24/17 0135  PROCALCITON  --  0.22  LATICACIDVEN 1.58  --     Recent Results (from the past 240 hour(s))  MRSA PCR Screening     Status:  None   Collection Time: 05/24/17 12:42 AM  Result Value Ref Range Status   MRSA by PCR NEGATIVE NEGATIVE Final    Comment:        The GeneXpert MRSA Assay (FDA approved for NASAL specimens only), is one component of a comprehensive MRSA colonization surveillance program. It is not intended to diagnose MRSA infection nor to guide or monitor treatment for MRSA infections. Performed at [facility], [place] [address], [place], [place] [address]   Culture, blood (x 2)     Status: None   Collection Time: 05/24/17  1:35 AM  Result Value Ref Range Status   Specimen Description   Final    BLOOD RIGHT HAND Performed at [place] [address] [place], [place] [address]    Special Requests   Final    BOTTLES DRAWN AEROBIC ONLY BCAV Performed at [place] [address] [place],  [address]    Culture   Final    NO GROWTH 5 DAYS Performed at [facility], [place] [address],  [place], [place] [address]    Report Status 05/29/2017 FINAL  Final  Culture, blood (x 2)     Status: None   Collection Time: 05/24/17  1:35 AM  Result Value Ref Range Status   Specimen Description   Final    BLOOD LEFT HAND Performed at [place] [address] [place], [place] [address]    Special Requests   Final    BOTTLES DRAWN AEROBIC AND ANAEROBIC Blood Culture adequate volume Performed at [place] [address], [place], [place] [address]    Culture   Final    NO GROWTH 5 DAYS Performed at [facility], [place] [address], [place], [place] [address]    Report Status 05/29/2017 FINAL  Final  Urine culture     Status: Abnormal   Collection Time: 05/24/17  2:57 AM  Result Value Ref Range Status   Specimen Description   Final    URINE, RANDOM Performed at [place] [address], [place], [place] [address]    Special Requests   Final    NONE Performed  at [facility], [place] [address] [place], [place] [address]    Culture >=100,000 COLONIES/mL STAPHYLOCOCCUS EPIDERMIDIS (A)  Final   Report Status 05/26/2017 FINAL  Final   Organism ID, Bacteria STAPHYLOCOCCUS EPIDERMIDIS (A)  Final      Susceptibility   Staphylococcus epidermidis - MIC*    CIPROFLOXACIN 4 RESISTANT Resistant     GENTAMICIN >=16 RESISTANT Resistant     NITROFURANTOIN <=16 SENSITIVE Sensitive     OXACILLIN >=4 RESISTANT Resistant     TETRACYCLINE <=1 SENSITIVE Sensitive     VANCOMYCIN 1 SENSITIVE Sensitive     TRIMETH/SULFA <=10 SENSITIVE Sensitive     CLINDAMYCIN <=0.25 SENSITIVE Sensitive     RIFAMPIN <=0.5 SENSITIVE Sensitive     Inducible Clindamycin NEGATIVE Sensitive     * >=100,000 COLONIES/mL STAPHYLOCOCCUS EPIDERMIDIS         Radiology Studies: No results found.      Scheduled Meds: . angioplasty book   Does not apply Once  . aspirin EC  81 mg Oral Daily  . clopidogrel  75 mg Oral Q breakfast  . enoxaparin (LOVENOX) injection  30 mg Subcutaneous QHS  . feeding supplement (ENSURE ENLIVE)  237 mL Oral BID BM  . ferrous sulfate  325 mg Oral Daily  . insulin aspart  0-5 Units Subcutaneous QHS  . insulin aspart  0-9 Units Subcutaneous TID WC  . insulin aspart  2 Units Subcutaneous TID WC  . insulin glargine  8 Units Subcutaneous Daily  . levothyroxine  100 mcg Oral QAC breakfast  . mouth rinse  15 mL Mouth Rinse BID  . megestrol  400 mg Oral BID  . metoCLOPramide (REGLAN) injection  5 mg Intravenous Q8H  . multivitamin with minerals  1 tablet Oral Daily  . polyethylene glycol  17 g Oral Daily  . senna-docusate  1 tablet Oral BID  . sodium chloride flush  3 mL Intravenous Q12H   Continuous Infusions: . sodium chloride       LOS: 7 days     Cordelia Poche, MD [facility] 05/30/2017, 2:38 PM Pager: [phone]  If 7PM-7AM, please contact night-coverage [email or web address] Password [email or web address] 05/30/2017, 2:38 PM

## 2017-05-30 NOTE — Care Management Note (Addendum)
Case Management Note  Patient Details  Name: Brittany Mercado MRN: [id number] Date of Birth: 10/01/1950  Subjective/Objective:  Pt presented as a transfer from [facility] surgery consulted right foot ulcer-performed Ultrasound-guided access, left femoral artery-atherectomy and drug-coated balloon angioplasty. PTA from home with son. Pt states that son takes her to appointments and gets medication. Son was called and he states he has difficulty getting patient to appointments at time. CM did reach out to CSW- Pt has Medicaid secondary and may qualify for transportation. Family to call [facility] DSS @ [phone]. CM to relay this information to patient's son. No DME @ home at this time. PT/OT recommendations for [facility] PT with DME RW. Pt will need HH PT orders and F2F along with DME RW.            Action/Plan: CM did offer choice and pt/son wants to use [facility] for DME RW. CM did place a call to [facility] to see if they can assist with [place]. Awaiting call back. CM did make referral with [facility] for DME RW to be delivered to room prior to d/c. Will continue to follow.     Expected Discharge Date:                  Expected Discharge Plan:  [place]  In-House Referral: [facility] Discharge planning Services  CM Consult  Post Acute Care Choice:  NA Choice offered to:  Patient, Adult Children  DME Arranged:  [place] [facility] DME Agency:   [place] Arranged:  PT HH Agency:   [place]  Status of Service:  Completed If discussed at [place] of Stay Meetings, dates discussed:    Additional Comments: 1658 05-30-17 Jacqlyn Krauss, RN,BSN [phone] [facility] unable to take the Insurance not in network- CM did call [facility] and they will be able to service for PT. Referral made to [facility]. SOC to begin within 24-48 hours post d/c.    1645 05-30-17 Jacqlyn Krauss, RN,BSN [phone] [facility] referral made for assistance with transportation as well. Will continue  to follow.  Bethena Roys, RN 05/30/2017, 4:24 PM

## 2017-05-30 NOTE — Progress Notes (Signed)
Physical Therapy Treatment Patient Details Name: Brittany Mercado MRN: [id number] DOB: 1951/01/27 Today's Date: 05/30/2017    History of Present Illness Brittany Mercado is a 67 y.o. 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 11.19, recently diag type 2 DM. Hypertension, hypothyroidism, was brought in by family for hyperglycemia and confusion. Pt is now s/p R superficial femoral artherectomy and angioplasty on 4/3.    PT Comments    Pt with reports of numbness in R foot following her procedure yesterday. Of note, with ambulation this session pt's HR increasing from 118bpm to low 130's with quick return to low 100's upon sitting. Pt would continue to benefit from skilled physical therapy services at this time while admitted and after d/c to address the below listed limitations in order to improve overall safety and independence with functional mobility.    Follow Up Recommendations  Home health PT     Equipment Recommendations  Rolling walker with 5" wheels    Recommendations for Other Services       Precautions / Restrictions Precautions Precautions: Fall Restrictions Weight Bearing Restrictions: No    Mobility  Bed Mobility Overal bed mobility: Independent                Transfers Overall transfer level: Modified independent Equipment used: None                Ambulation/Gait Ambulation/Gait assistance: Min guard Ambulation Distance (Feet): 20 Feet Assistive device: None Gait Pattern/deviations: Step-through pattern;Decreased step length - right;Decreased step length - left;Decreased stride length;Antalgic Gait velocity: decreased Gait velocity interpretation: Below normal speed for age/gender General Gait Details: mildly antalgic gait pattern without use of an AD; pt only willing to ambulate within room this session; no overt LOB or need for physical  assistance with min guard   Stairs            Wheelchair Mobility    Modified Rankin (Stroke Patients Only)       Balance Overall balance assessment: Needs assistance Sitting-balance support: Feet supported Sitting balance-Leahy Scale: Good     Standing balance support: During functional activity;No upper extremity supported Standing balance-Leahy Scale: Fair                              Cognition Arousal/Alertness: Awake/alert Behavior During Therapy: WFL for tasks assessed/performed Overall Cognitive Status: Within Functional Limits for tasks assessed                                        Exercises      General Comments        Pertinent Vitals/Pain Pain Assessment: No/denies pain    Home Living                      Prior Function            PT Goals (current goals can now be found in the care plan section) Acute Rehab PT Goals PT Goal Formulation: With patient Time For Goal Achievement: 06/08/17 Potential to Achieve Goals: Good Progress towards PT goals: Progressing toward goals    Frequency    Min 3X/week      PT Plan Current plan remains appropriate    Co-evaluation  AM-PAC PT "6 Clicks" Daily Activity  Outcome Measure  Difficulty turning over in bed (including adjusting bedclothes, sheets and blankets)?: None Difficulty moving from lying on back to sitting on the side of the bed? : None Difficulty sitting down on and standing up from a chair with arms (e.g., wheelchair, bedside commode, etc,.)?: A Little Help needed moving to and from a bed to chair (including a wheelchair)?: None Help needed walking in hospital room?: A Little Help needed climbing 3-5 steps with a railing? : A Little 6 Click Score: 21    End of Session Equipment Utilized During Treatment: Gait belt Activity Tolerance: Patient limited by fatigue Patient left: in chair;with call bell/phone within reach Nurse  Communication: Mobility status PT Visit Diagnosis: Unsteadiness on feet (R26.81);Difficulty in walking, not elsewhere classified (R26.2)     Time: 7425-9563 PT Time Calculation (min) (ACUTE ONLY): 10 min  Charges:  $Therapeutic Activity: 8-22 mins                    G Codes:       [place], [place], [place] 05/30/2017, 9:36 AM

## 2017-05-30 NOTE — Progress Notes (Signed)
   VASCULAR SURGERY ASSESSMENT & PLAN:   S/P angioplasty of the right SFA yesterday by Dr. Trula Slade. Had an excellent result.   I will follow the right 5th toe wound as an out-pt  Vascular will be available as needed.    SUBJECTIVE:   No complaints  PHYSICAL EXAM:   Vitals:   05/30/17 0700 05/30/17 1219 05/30/17 1526 05/30/17 1855  BP: (!) 134/41 (!) 117/49 (!) 122/48 (!) 117/58  Pulse: 93 97 97 92  Resp:  16 (!) 22 (!) 26  Temp: 98.3 F (36.8 C) 98.7 F (37.1 C) 98.6 F (37 C) 98.2 F (36.8 C)  TempSrc: Oral Oral Oral Oral  SpO2:  100% 100% 100%  Weight:      Height:       Brisk right DP and PT with the doppler. Left groin looks fine.   LABS:   Lab Results  Component Value Date   WBC 8.9 05/30/2017   HGB 8.7 (L) 05/30/2017   HCT 24.5 (L) 05/30/2017   MCV 78.3 05/30/2017   PLT 348 05/30/2017   Lab Results  Component Value Date   CREATININE 1.02 (H) 05/30/2017   Lab Results  Component Value Date   INR 0.93 04/08/2017   CBG (last 3)  Recent Labs    05/30/17 0630 05/30/17 1243 05/30/17 1703  GLUCAP 452* 351* 298*    PROBLEM LIST:    Principal Problem:   DKA (diabetic ketoacidoses) (HCC) Active Problems:   Adenocarcinoma of right lung, stage 4 (HCC)   HTN (hypertension)   Iron deficiency anemia due to chronic blood loss   Diabetes mellitus type 2 in nonobese (HCC)   Severe protein-calorie malnutrition (HCC)   AKI (acute kidney injury) ([place])   SIRS (systemic inflammatory response syndrome) (HCC)   GERD (gastroesophageal reflux disease)   Hypothyroidism   DKA, type 2 (HCC)   Toe ulcer ([place])   CURRENT MEDS:   . angioplasty book   Does not apply Once  . aspirin EC  81 mg Oral Daily  . clopidogrel  75 mg Oral Q breakfast  . enoxaparin (LOVENOX) injection  30 mg Subcutaneous QHS  . feeding supplement (ENSURE ENLIVE)  237 mL Oral BID BM  . ferrous sulfate  325 mg Oral Daily  . insulin aspart  0-5 Units Subcutaneous QHS  . insulin aspart  0-9  Units Subcutaneous TID WC  . insulin aspart  3 Units Subcutaneous TID WC  . insulin glargine  8 Units Subcutaneous Daily  . levothyroxine  100 mcg Oral QAC breakfast  . mouth rinse  15 mL Mouth Rinse BID  . megestrol  400 mg Oral BID  . metoCLOPramide (REGLAN) injection  5 mg Intravenous Q8H  . multivitamin with minerals  1 tablet Oral Daily  . polyethylene glycol  17 g Oral Daily  . senna-docusate  1 tablet Oral BID  . sodium chloride flush  3 mL Intravenous Q12H    Deitra Mayo Beeper: [phone] Office: [phone] 05/30/2017

## 2017-05-31 LAB — GLUCOSE, CAPILLARY
Glucose-Capillary: 254 mg/dL — ABNORMAL HIGH (ref 65–99)
Glucose-Capillary: 182 mg/dL — ABNORMAL HIGH (ref 65–99)

## 2017-05-31 LAB — HEMOGLOBIN AND HEMATOCRIT, BLOOD
HCT: 26.2 % — ABNORMAL LOW (ref 36.0–46.0)
Hemoglobin: 9.3 g/dL — ABNORMAL LOW (ref 12.0–15.0)

## 2017-05-31 MED ORDER — ASPIRIN 81 MG PO TBEC: 81.0000 mg | DELAYED_RELEASE_TABLET | Freq: Every day | ORAL | 0 refills | Status: AC

## 2017-05-31 MED ORDER — INSULIN GLARGINE 100 UNIT/ML SOLOSTAR PEN: 8.0000 [IU] | PEN_INJECTOR | Freq: Every day | SUBCUTANEOUS | 0 refills | Status: DC

## 2017-05-31 MED ORDER — CLOPIDOGREL BISULFATE 75 MG PO TABS: 75.0000 mg | ORAL_TABLET | Freq: Every day | ORAL | 0 refills | Status: AC

## 2017-05-31 MED ORDER — INSULIN LISPRO 100 UNIT/ML (KWIKPEN): 3.0000 [IU] | PEN_INJECTOR | Freq: Three times a day (TID) | SUBCUTANEOUS | 11 refills | Status: DC

## 2017-05-31 MED ORDER — PREMIER PROTEIN SHAKE
11.0000 [oz_av] | Freq: Three times a day (TID) | ORAL | Status: DC
  Filled 2017-05-31 (×4): qty 325.31

## 2017-05-31 MED FILL — Lidocaine HCl Local Inj 1%: INTRAMUSCULAR | Qty: 20 | Status: AC

## 2017-05-31 NOTE — Progress Notes (Signed)
Physical Therapy Treatment Patient Details Name: Brittany Mercado MRN: [id number] DOB: Jun 20, 1950 Today's Date: 05/31/2017    History of Present Illness Brittany Mercado is a 67 y.o. 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 11.19, recently diag type 2 DM. Hypertension, hypothyroidism, was brought in by family for hyperglycemia and confusion. Pt is now s/p R superficial femoral artherectomy and angioplasty on 4/3.    PT Comments    Patient received in bed, willing to participate with PT in the room only today, declines going into hallway. Ambulated approximately 27f in the room with S, then performed strengthening exercises while seated in chair today. She was encouraged to remain sitting up to assist with reducing cough and was left up in the chair with all needs met this morning.     Follow Up Recommendations  Home health PT     Equipment Recommendations  Rolling walker with 5" wheels    Recommendations for Other Services       Precautions / Restrictions Precautions Precautions: Fall Precaution Comments: pt reports h/o unsteady gait but denies falls  Restrictions Weight Bearing Restrictions: No    Mobility  Bed Mobility Overal bed mobility: Independent                Transfers Overall transfer level: Modified independent Equipment used: None                Ambulation/Gait Ambulation/Gait assistance: Supervision Ambulation Distance (Feet): 20 Feet Assistive device: None Gait Pattern/deviations: Step-through pattern;Decreased step length - right;Decreased step length - left;Decreased stride length;Antalgic Gait velocity: decreased   General Gait Details: continues to demonstrate mildly antalgic pattern without use of assistive device, declined going out into hallway again today; very mobile, no significant balance concerns noted today    Stairs             Wheelchair Mobility    Modified Rankin (Stroke Patients Only)       Balance Overall balance assessment: Needs assistance Sitting-balance support: Feet supported Sitting balance-Leahy Scale: Good     Standing balance support: During functional activity;No upper extremity supported Standing balance-Leahy Scale: Fair Standing balance comment: pt is able to maintain static without support, able to reach 4-6" outside BOS with min/guard, requires UE support for dynamic activity, unable to tolerate challenges                            Cognition Arousal/Alertness: Awake/alert Behavior During Therapy: WFL for tasks assessed/performed Overall Cognitive Status: Within Functional Limits for tasks assessed                                        Exercises General Exercises - Lower Extremity Long Arc Quad: Both;10 reps;Other (comment)(manual resistance from PT ) Hip ABduction/ADduction: Both;10 reps;Seated;Other (comment)(manual resistance from PT )    General Comments        Pertinent Vitals/Pain Pain Assessment: No/denies pain Pain Score: 0-No pain Pain Intervention(s): Limited activity within patient's tolerance;Monitored during session    Home Living                      Prior Function            PT Goals (current goals can now be found in the  care plan section) Acute Rehab PT Goals Patient Stated Goal: return home PT Goal Formulation: With patient Time For Goal Achievement: 06/08/17 Potential to Achieve Goals: Good Progress towards PT goals: Progressing toward goals    Frequency    Min 3X/week      PT Plan Current plan remains appropriate    Co-evaluation              AM-PAC PT "6 Clicks" Daily Activity  Outcome Measure  Difficulty turning over in bed (including adjusting bedclothes, sheets and blankets)?: None Difficulty moving from lying on back to sitting on the side of the bed? : None Difficulty sitting  down on and standing up from a chair with arms (e.g., wheelchair, bedside commode, etc,.)?: None Help needed moving to and from a bed to chair (including a wheelchair)?: None Help needed walking in hospital room?: A Little Help needed climbing 3-5 steps with a railing? : A Little 6 Click Score: 22    End of Session   Activity Tolerance: Patient tolerated treatment well Patient left: in chair;with call bell/phone within reach   PT Visit Diagnosis: Unsteadiness on feet (R26.81);Difficulty in walking, not elsewhere classified (R26.2)     Time: 2099-0689 PT Time Calculation (min) (ACUTE ONLY): 10 min  Charges:  $Therapeutic Activity: 8-22 mins                    G Codes:       Deniece Ree PT, DPT, CBIS  Supplemental Physical Therapist [place]   Pager [phone]

## 2017-05-31 NOTE — Discharge Summary (Signed)
Physician Discharge Summary  Brittany Mercado WER:[id number] DOB: [birth year] DOA: 05/23/2017  PCP: [name], DO  Admit date: 05/23/2017 Discharge date: 05/31/2017  Admitted From: Home Disposition: Home  Recommendations for Outpatient Follow-up:  1. Follow up with PCP in 1 week 2. Follow up with Vascular surgery in 2 weeks 3. Please obtain BMP/CBC in one week 4. Please follow up on the following pending results: None  Home Health: PT Equipment/Devices: Rolling walker  Discharge Condition: Stable CODE STATUS: Full code Diet recommendation: Heart healthy/carb modified   Brief/Interim Summary:  Admission HPI written by Norval Morton, MD   Chief Complaint: Weakness  I have personally briefly reviewed patient's old medical records in [place]   HPI: Brittany Mercado is a 67 y.o. female with medical history significant of metastatic NSCLC on chemotherapy of Keytruda w/ last tx 3/11, HTN, DM type II, hypothyroidism, tobacco abuse; who presented to the emergency department with complaints of worsening generalized weakness, anorexia, and elevated blood sugars.  Patient was just hospitalized from 3/13-3/15, after being found to be in DKA with acute metabolic encephalopathy and meeting SIRS criteria without any clear source of infection found.  Patient was placed on glucose stabilizer protocol and switch back to home regimen with blood sugars noted to be stable.  Since being home patient reports having poor appetite, but reports taking all medications including diabetic medication as prescribed.  Despite poor appetite and reportedly taking insulin at home her blood sugars have remained elevated.  Associated symptoms include mild shortness of breath, nausea, vomiting, achy right-sided chest pain that is reproducible with palpation(chronic), suprapubic abdominal pain, and reports of brownish vaginal discharge.  Patient states that she was given a pill previously to try and treat  symptoms without relief.  ED Course: Upon admission to the emergency department patient was noted to be afebrile, pulse 102-106, respirations 14 to 32, blood pressures maintained, and O2 saturations maintained on room air.  Patient was found to show signs of DKA with bicarbonate 9, blood sugar 430,  and anion gap 23.  Other labs included WBC 15.5, lactic acid 1.58, negative troponin, lipase 214, acute renal injury with creatinine 1.35, temperature normal. Pending urinalysis. Patient is admitted to stepdown bed as inpatient. Insulin drip was started.    Hospital course:  DKA Patient treated with IV insulin. Now resolved.  Diabetes mellitus, type 2 Insulin dependent. Uncontrolled. Lantus increased to 8 units and mealtime coverage decreased to 3 units TID with meals.   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 Outpatient follow-up. Plavix added.  Staph epidermidis UTI Completed course of doxycycline  Adenocarcinoma of right lung, stage 4 Outpatient follow-up with oncology  Essential hypertension Well-controlled  Iron deficiency anemia Secondary to chronic blood loss.  Hemoglobin with slight drop status post procedure. Continued iron supplementation  GERD Continued Pepcid  Hypothyroidism Continued Synthroid  Acute kidney injury Resolved with IV fluids.  Severe protein calorie malnutrition Body mass index is 16.6 kg/m. Continue Ensure and Megace   Discharge Diagnoses:  Principal Problem:   DKA (diabetic ketoacidoses) ([place]) Active Problems:   Adenocarcinoma of right lung, stage 4 (HCC)   HTN (hypertension)   Iron deficiency anemia due to chronic blood loss   Diabetes mellitus type 2 in nonobese (HCC)   Severe protein-calorie malnutrition (HCC)   AKI (acute kidney injury) ([place])   SIRS (systemic inflammatory response syndrome) (HCC)   GERD (gastroesophageal  reflux disease)   Hypothyroidism   DKA, type 2 ([place])   Toe ulcer [facility])    Discharge Instructions  Discharge Instructions    AMB Referral to [facility]   Complete by:  As directed    Please assign to [place] for DM management and education. [facility] for transportation needs. Written consent obtained. Please call son-Danny Bolt at [phone] for transportation concerns. Will have [facility] for home health. PCP office ([facility])  listed as doing toc. Likely dc home today 05/31/17. Please call with questions. Marthenia Rolling, [place], RN,BSN-[facility] IONGEXB-[phone]   Reason for consult:  Please assign to [facility] Social Worker   Diagnoses of:  Diabetes   Expected date of contact:  1-3 days (reserved for hospital discharges)   Diet - low sodium heart healthy   Complete by:  As directed    Increase activity slowly   Complete by:  As directed      Allergies as of 05/31/2017      Reactions   Penicillins Swelling   SWELLING REACTION UNSPECIFIED  Has patient had a PCN reaction causing immediate rash, facial/tongue/throat swelling, SOB or lightheadedness with hypotension: Yes Has patient had a PCN reaction causing severe rash involving mucus membranes or skin necrosis: No Has patient had a PCN reaction that required hospitalization No Has patient had a PCN reaction occurring within the last 10 years: No If all of the above answers are "NO", then may proceed with Cephalosporin use.      Medication List    STOP taking these medications   aspirin 325 MG tablet Replaced by:  aspirin 81 MG EC tablet   Diclofenac Sodium 2 % Soln   glimepiride 2 MG tablet Commonly known as:  AMARYL     TAKE these medications   aspirin 81 MG EC tablet Take 1 tablet (81 mg total) by mouth daily. Start taking on:  06/01/2017 Replaces:  aspirin 325 MG tablet   blood glucose meter kit and supplies Kit Dispense based on patient and insurance preference. Use  up to four times daily as directed. (FOR ICD-9 250.00, 250.01).   cetirizine 10 MG tablet Commonly known as:  ZYRTEC TAKE 1 TABLET(10 MG) BY MOUTH DAILY   clopidogrel 75 MG tablet Commonly known as:  PLAVIX Take 1 tablet (75 mg total) by mouth daily with breakfast. Start taking on:  06/01/2017   famotidine 10 MG chewable tablet Commonly known as:  PEPCID AC Chew 10 mg by mouth daily as needed for heartburn.   ferrous sulfate 325 (65 FE) MG tablet Take 325 mg by mouth daily.   Insulin Glargine 100 UNIT/ML Solostar Pen Commonly known as:  LANTUS Inject 8 Units into the skin daily at 10 pm. What changed:  how much to take   insulin lispro 100 UNIT/ML KiwkPen Commonly known as:  HUMALOG Inject 0.03 mLs (3 Units total) into the skin 3 (three) times daily with meals. What changed:    how much to take  how to take this  when to take this   Insulin Pen Needle 31G X 8 MM Misc Commonly known as:  B-D ULTRAFINE III SHORT PEN USE AS DIRECTED WITH INSULIN   levothyroxine 100 MCG tablet Commonly known as:  SYNTHROID, LEVOTHROID TAKE 1 TABLET(100 MCG) BY MOUTH DAILY BEFORE BREAKFAST   megestrol 625 MG/5ML suspension Commonly known as:  MEGACE ES Take 5 mLs (625 mg total) by mouth daily.   meloxicam 15 MG  tablet Commonly known as:  MOBIC TAKE 1 TABLET(15 MG) BY MOUTH DAILY   metFORMIN 500 MG tablet Commonly known as:  GLUCOPHAGE Take 2 tablets (1,000 mg total) by mouth 2 (two) times daily with a meal. (take as written in dc summary)   multivitamin tablet Take 1 tablet by mouth daily.   ondansetron 4 MG tablet Commonly known as:  ZOFRAN Take 4 mg by mouth every 8 (eight) hours as needed for nausea or vomiting.   protein supplement shake Liqd Commonly known as:  PREMIER PROTEIN Take 325 mLs (11 oz total) by mouth daily.            Durable Medical Equipment  (From admission, onward)        Start     Ordered   05/31/17 1253  For home use only DME Walker rolling   Once    Question:  Patient needs a walker to treat with the following condition  Answer:  Gangrene ([place])   05/31/17 [place] Follow up.   Why:  Rolling Walker to be delivered to room prior to d/c.  Contact information: [place] [address] [place], [facility] Follow up.   Specialty:  Home Health Services Why:  Physical Therapy Contact information: [address] [place] [address] [phone]        [place] Follow up.   Why:  Family to call [facility] DSS @ [phone] to set uop Medicaid Transportation for Appointments.  Contact information: Medicaid Transportation Needs-       Angelia Mould, MD. Schedule an appointment as soon as possible for a visit in 2 week(s).   Specialties:  Vascular Surgery, Cardiology Contact information: [place] [address] [phone]        [name], DO. Schedule an appointment as soon as possible for a visit in 1 week(s).   Specialty:  Family Medicine Contact information: [place] [address] [phone]          Allergies  Allergen Reactions  . Penicillins Swelling    SWELLING REACTION UNSPECIFIED  Has patient had a PCN reaction causing immediate rash, facial/tongue/throat swelling, SOB or lightheadedness with hypotension: Yes Has patient had a PCN reaction causing severe rash involving mucus membranes or skin necrosis: No Has patient had a PCN reaction that required hospitalization No Has patient had a PCN reaction occurring within the last 10 years: No If all of the above answers are "NO", then may proceed with Cephalosporin use.    Consultations:  Vascular surgery   Procedures/Studies: Dg Chest 2 View  Result Date: 05/23/2017 CLINICAL DATA:  Shortness of breath. Dyspnea. Chest pain. Right  lung carcinoma. EXAM: CHEST - 2 VIEW COMPARISON:  Chest CT on 05/21/2017 and chest radiograph on 05/08/2017 FINDINGS: Heart size remains normal.  Aortic atherosclerosis. Pulmonary hyperinflation is again seen, consistent with COPD. Asymmetric soft tissue prominence in the right hilum is unchanged consistent with lymphadenopathy. Mass seen in superior segment of right lower lobe on recent chest CT is not well visualized on this exam. No evidence of pleural effusion or pneumothorax. IMPRESSION: Stable COPD and right hilar lymphadenopathy. No acute findings. See recent chest CT report. Electronically Signed   By: Earle Gell M.D.   On: 05/23/2017 20:57   Dg Chest  2 View  Result Date: 05/08/2017 CLINICAL DATA:  Tachycardia.  Lung cancer. EXAM: CHEST - 2 VIEW COMPARISON:  04/08/2017 FINDINGS: COPD with emphysema. Asymmetric density overlying the right hilum compatible with tumor is noted on prior CT. Negative for pneumonia or effusion. Negative for heart failure. No acute skeletal abnormality. IMPRESSION: COPD Right lower lobe mass lesion and right hilar adenopathy, stable Electronically Signed   By: Franchot Gallo M.D.   On: 05/08/2017 11:48   Ct Head Wo Contrast  Result Date: 05/08/2017 CLINICAL DATA:  Adenocarcinoma of the right lung post radiation chemotherapy. Two days of nausea, vomiting and diarrhea status post [place] infusion 3 days ago. EXAM: CT HEAD WITHOUT CONTRAST TECHNIQUE: Contiguous axial images were obtained from the base of the skull through the vertex without intravenous contrast. COMPARISON:  04/08/2017 FINDINGS: Brain: No evidence of acute large vascular territory infarct, intracranial hemorrhage, intra-axial mass or midline shift. No extra-axial fluid collections. Stable ventricles and sulci. 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 Midline fourth ventricle and basal cisterns without effacement. No hydrocephalus. Vascular: No hyperdense vessel sign. Skull: No  suspicious osseous lesions. Sinuses/Orbits: No acute finding. Other: None IMPRESSION: Stable appearance of the brain without acute intracranial abnormality. Chronic appearing small vessel ischemic disease. Electronically Signed   By: Ashley Royalty M.D.   On: 05/08/2017 19:47   Ct Chest W Contrast  Result Date: 05/21/2017 CLINICAL DATA:  Restaging lung cancer chronic cough. Initial diagnosis October 2017. EXAM: CT CHEST, ABDOMEN, AND PELVIS WITH CONTRAST TECHNIQUE: Multidetector CT imaging of the chest, abdomen and pelvis was performed following the standard protocol during bolus administration of intravenous contrast. CONTRAST:  59m ISOVUE-300 IOPAMIDOL (ISOVUE-300) INJECTION 61% COMPARISON:  Multiple prior examinations. The most recent is 03/22/2017. FINDINGS: CT CHEST FINDINGS Cardiovascular: The heart is normal in size. No pericardial effusion. The aorta is normal in caliber. Minimal scattered atherosclerotic calcifications. No dissection. Stable coronary artery calcifications. Mediastinum/Nodes: Interval enlargement of the necrotic appearing right hilar/infrahilar mass. This measures 3.0 x 2.8 cm on image number 33 and previously measured 1.6 x 1.5 cm. Findings consistent with recurrent tumor. Stable left infrahilar lymph node on image number 30 measuring 6 mm. No enlarged mediastinal lymph nodes. Lungs/Pleura: Stable advanced emphysematous changes and apical pleural and parenchymal scarring. Stable 3.5 mm subpleural pulmonary nodule in the right upper lobe on image number 66. Necrotic appearing right lower lobe peripheral pulmonary nodule on image number 72 measures 18.5 mm and previously measured 18.5 mm. No new pulmonary lesions. No acute pulmonary findings or pleural effusion. Musculoskeletal: No breast masses, supraclavicular or axillary adenopathy. Small scattered lymph nodes are stable. No significant bony findings. CT ABDOMEN PELVIS FINDINGS Hepatobiliary: No focal hepatic lesions to suggest  metastatic disease. The gallbladder is normal. No common bile duct dilatation. Pancreas: No mass, inflammation or ductal dilatation. A 6 mm cystic lesion was identified on the prior CT scan but no lesion is seen on today's study. This could be a small fatty cleft. Spleen: Normal size.  No focal lesions. Adrenals/Urinary Tract: Enlarging right adrenal gland lesion. This measures 31.5 x 21 mm on series 7, image 2 this measured 27 x 18 mm on the prior examination. The kidneys are unremarkable.  The bladder is normal. Stomach/Bowel: The stomach, duodenum, small bowel and colon are grossly normal without oral contrast. No inflammatory changes, mass lesions or obstructive findings. Vascular/Lymphatic: Advanced aortic and iliac artery calcifications but no aneurysm or dissection. The major venous structures are patent. Small scattered mesenteric and retroperitoneal  lymph nodes but no mass or overt adenopathy. Reproductive: Surgically absent. Other: No pelvic mass or adenopathy. No free pelvic fluid collections. No inguinal mass or adenopathy. No abdominal wall hernia or subcutaneous lesions. Musculoskeletal: No significant bony findings. IMPRESSION: 1. Enlarging necrotic appearing right infrahilar adenopathy. 2. Stable right lower lobe peripheral pulmonary nodule and no new pulmonary lesions. 3. Stable advanced emphysematous changes and pulmonary scarring. 4. Slight interval enlargement of the right adrenal gland mass. 5. The 6 mm pancreatic cystic lesion is no longer identified for certain. 6. Stable advanced emphysematous changes and pulmonary scarring. Electronically Signed   By: Marijo Sanes M.D.   On: 05/21/2017 14:17   Ct Abdomen Pelvis W Contrast  Result Date: 05/21/2017 CLINICAL DATA:  Restaging lung cancer chronic cough. Initial diagnosis October 2017. EXAM: CT CHEST, ABDOMEN, AND PELVIS WITH CONTRAST TECHNIQUE: Multidetector CT imaging of the chest, abdomen and pelvis was performed following the standard  protocol during bolus administration of intravenous contrast. CONTRAST:  83m ISOVUE-300 IOPAMIDOL (ISOVUE-300) INJECTION 61% COMPARISON:  Multiple prior examinations. The most recent is 03/22/2017. FINDINGS: CT CHEST FINDINGS Cardiovascular: The heart is normal in size. No pericardial effusion. The aorta is normal in caliber. Minimal scattered atherosclerotic calcifications. No dissection. Stable coronary artery calcifications. Mediastinum/Nodes: Interval enlargement of the necrotic appearing right hilar/infrahilar mass. This measures 3.0 x 2.8 cm on image number 33 and previously measured 1.6 x 1.5 cm. Findings consistent with recurrent tumor. Stable left infrahilar lymph node on image number 30 measuring 6 mm. No enlarged mediastinal lymph nodes. Lungs/Pleura: Stable advanced emphysematous changes and apical pleural and parenchymal scarring. Stable 3.5 mm subpleural pulmonary nodule in the right upper lobe on image number 66. Necrotic appearing right lower lobe peripheral pulmonary nodule on image number 72 measures 18.5 mm and previously measured 18.5 mm. No new pulmonary lesions. No acute pulmonary findings or pleural effusion. Musculoskeletal: No breast masses, supraclavicular or axillary adenopathy. Small scattered lymph nodes are stable. No significant bony findings. CT ABDOMEN PELVIS FINDINGS Hepatobiliary: No focal hepatic lesions to suggest metastatic disease. The gallbladder is normal. No common bile duct dilatation. Pancreas: No mass, inflammation or ductal dilatation. A 6 mm cystic lesion was identified on the prior CT scan but no lesion is seen on today's study. This could be a small fatty cleft. Spleen: Normal size.  No focal lesions. Adrenals/Urinary Tract: Enlarging right adrenal gland lesion. This measures 31.5 x 21 mm on series 7, image 2 this measured 27 x 18 mm on the prior examination. The kidneys are unremarkable.  The bladder is normal. Stomach/Bowel: The stomach, duodenum, small bowel and  colon are grossly normal without oral contrast. No inflammatory changes, mass lesions or obstructive findings. Vascular/Lymphatic: Advanced aortic and iliac artery calcifications but no aneurysm or dissection. The major venous structures are patent. Small scattered mesenteric and retroperitoneal lymph nodes but no mass or overt adenopathy. Reproductive: Surgically absent. Other: No pelvic mass or adenopathy. No free pelvic fluid collections. No inguinal mass or adenopathy. No abdominal wall hernia or subcutaneous lesions. Musculoskeletal: No significant bony findings. IMPRESSION: 1. Enlarging necrotic appearing right infrahilar adenopathy. 2. Stable right lower lobe peripheral pulmonary nodule and no new pulmonary lesions. 3. Stable advanced emphysematous changes and pulmonary scarring. 4. Slight interval enlargement of the right adrenal gland mass. 5. The 6 mm pancreatic cystic lesion is no longer identified for certain. 6. Stable advanced emphysematous changes and pulmonary scarring. Electronically Signed   By: PMarijo SanesM.D.   On: 05/21/2017 14:17  Subjective: Feels good today. Spirits up.  Discharge Exam: Vitals:   05/31/17 0714 05/31/17 1143  BP: (!) 114/49 (!) 110/47  Pulse: 87 85  Resp: 16 15  Temp: 98.2 F (36.8 C) 97.6 F (36.4 C)  SpO2: 100% 100%   Vitals:   05/30/17 2108 05/31/17 0433 05/31/17 0714 05/31/17 1143  BP: (!) 105/51 (!) 125/51 (!) 114/49 (!) 110/47  Pulse: 95 91 87 85  Resp: (!) _0 Temp: 98.1 F (36.7 C) 98 F (36.7 C) 98.2 F (36.8 C) 97.6 F (36.4 C)  TempSrc: Oral Oral Oral Oral  SpO2: 100% 100% 100% 100%  Weight:  42.5 kg (93 lb 11.1 oz)    Height:        General: Pt is alert, awake, not in acute distress Cardiovascular: RRR, S1/S2 +, no rubs, no gallops Respiratory: CTA bilaterally, no wheezing, no rhonchi Abdominal: Soft, NT, ND, bowel sounds + Extremities: no edema, no cyanosis    The results of significant diagnostics from  this hospitalization (including imaging, microbiology, ancillary and laboratory) are listed below for reference.     Microbiology: Recent Results (from the past 240 hour(s))  MRSA PCR Screening     Status: None   Collection Time: 05/24/17 12:42 AM  Result Value Ref Range Status   MRSA by PCR NEGATIVE NEGATIVE Final    Comment:        The GeneXpert MRSA Assay (FDA approved for NASAL specimens only), is one component of a comprehensive MRSA colonization surveillance program. It is not intended to diagnose MRSA infection nor to guide or monitor treatment for MRSA infections. Performed at [facility], [place] [address], [place], [place] [address]   Culture, blood (x 2)     Status: None   Collection Time: 05/24/17  1:35 AM  Result Value Ref Range Status   Specimen Description   Final    BLOOD RIGHT HAND Performed at [place] [address], [place], [place] [address]    Special Requests   Final    BOTTLES DRAWN AEROBIC ONLY BCAV Performed at [facility] [address], [place], [place] [address]    Culture   Final    NO GROWTH 5 DAYS Performed at [facility], [place] [address] [place], [place] [address]    Report Status 05/29/2017 FINAL  Final  Culture, blood (x 2)     Status: None   Collection Time: 05/24/17  1:35 AM  Result Value Ref Range Status   Specimen Description   Final    BLOOD LEFT HAND Performed at [facility] [address], [place], [place] [address]    Special Requests   Final    BOTTLES DRAWN AEROBIC AND ANAEROBIC Blood Culture adequate volume Performed at [facility] [address] [place], [place] [address]    Culture   Final    NO GROWTH 5 DAYS Performed at [facility], [place] [address] [place], [place] [address]    Report Status 05/29/2017 FINAL  Final  Urine culture     Status: Abnormal   Collection Time: 05/24/17  2:57 AM  Result  Value Ref Range Status   Specimen Description   Final    URINE, RANDOM Performed at [place] [facility] [address], [place], [place] [address]    Special Requests   Final    NONE Performed at [facility], [place] [address] [place], [place] [address]    Culture >=100,000 COLONIES/mL STAPHYLOCOCCUS EPIDERMIDIS (A)  Final   Report Status 05/26/2017 FINAL  Final   Organism ID, Bacteria STAPHYLOCOCCUS EPIDERMIDIS (A)  Final      Susceptibility   Staphylococcus epidermidis - MIC*    CIPROFLOXACIN 4 RESISTANT Resistant     GENTAMICIN >=16 RESISTANT Resistant     NITROFURANTOIN <=16 SENSITIVE Sensitive     OXACILLIN >=4 RESISTANT Resistant     TETRACYCLINE <=1 SENSITIVE Sensitive     VANCOMYCIN 1 SENSITIVE Sensitive     TRIMETH/SULFA <=10 SENSITIVE Sensitive     CLINDAMYCIN <=0.25 SENSITIVE Sensitive     RIFAMPIN <=0.5 SENSITIVE Sensitive     Inducible Clindamycin NEGATIVE Sensitive     * >=100,000 COLONIES/mL STAPHYLOCOCCUS EPIDERMIDIS     Labs: BNP (last 3 results) No results for input(s): BNP in the last 8760 hours. Basic Metabolic Panel: Recent Labs  Lab 05/24/17 2322 05/26/17 0534 05/27/17 0537 05/30/17 0258  NA 128* 136 135 134*  K 3.7 3.3* 3.7 4.2  CL 102 108 107 102  CO2 19* 21* 21* 19*  GLUCOSE 491* 192* 269* 384*  BUN _0 CREATININE 0.93 0.82 0.90 1.02*  CALCIUM 8.0* 7.7* 7.8* 7.9*   Liver Function Tests: No results for input(s): AST, ALT, ALKPHOS, BILITOT, PROT, ALBUMIN in the last 168 hours. No results for input(s): LIPASE, AMYLASE in the last 168 hours. No results for input(s): AMMONIA in the last 168 hours. CBC: Recent Labs  Lab 05/26/17 0534 05/30/17 0258 05/31/17 1043  WBC 7.5 8.9  --   HGB 9.4* 8.7* 9.3*  HCT 26.1* 24.5* 26.2*  MCV 78.9 78.3  --   PLT 309 348  --    Cardiac Enzymes: No results for input(s): CKTOTAL, CKMB, CKMBINDEX, TROPONINI in the last 168 hours. BNP: Invalid input(s):  POCBNP CBG: Recent Labs  Lab 05/30/17 1243 05/30/17 1703 05/30/17 2143 05/31/17 0557 05/31/17 1230  GLUCAP 351* 298* 177* 254* 182*   D-Dimer No results for input(s): DDIMER in the last 72 hours. Hgb A1c No results for input(s): HGBA1C in the last 72 hours. Lipid Profile No results for input(s): CHOL, HDL, LDLCALC, TRIG, CHOLHDL, LDLDIRECT in the last 72 hours. Thyroid function studies No results for input(s): TSH, T4TOTAL, T3FREE, THYROIDAB in the last 72 hours.  Invalid input(s): FREET3 Anemia work up No results for input(s): VITAMINB12, FOLATE, FERRITIN, TIBC, IRON, RETICCTPCT in the last 72 hours. Urinalysis    Component Value Date/Time   COLORURINE YELLOW 05/24/2017 0257   APPEARANCEUR HAZY (A) 05/24/2017 0257   LABSPEC 1.015 05/24/2017 0257   PHURINE 5.0 05/24/2017 0257   GLUCOSEU >=500 (A) 05/24/2017 0257   HGBUR SMALL (A) 05/24/2017 0257   BILIRUBINUR NEGATIVE 05/24/2017 0257   KETONESUR 80 (A) 05/24/2017 0257   PROTEINUR 100 (A) 05/24/2017 0257   NITRITE NEGATIVE 05/24/2017 0257   LEUKOCYTESUR LARGE (A) 05/24/2017 0257   Sepsis Labs Invalid input(s): PROCALCITONIN,  WBC,  LACTICIDVEN Microbiology Recent Results (from the past 240 hour(s))  MRSA PCR Screening     Status: None   Collection Time: 05/24/17 12:42 AM  Result Value Ref Range Status   MRSA by PCR NEGATIVE NEGATIVE Final    Comment:        The GeneXpert MRSA Assay (FDA approved for NASAL specimens only), is one component of a comprehensive MRSA colonization surveillance program. It is not intended to diagnose MRSA infection nor to guide or monitor treatment for MRSA infections. Performed  at [facility], [place] [address] [place], [place] [address]   Culture, blood (x 2)     Status: None   Collection Time: 05/24/17  1:35 AM  Result Value Ref Range Status   Specimen Description   Final    BLOOD RIGHT HAND Performed at [facility] [address],  [place], [place] [address]    Special Requests   Final    BOTTLES DRAWN AEROBIC ONLY BCAV Performed at [facility] [address], [place], [place] [address]    Culture   Final    NO GROWTH 5 DAYS Performed at [facility], [place] [address], [place], [place] [address]    Report Status 05/29/2017 FINAL  Final  Culture, blood (x 2)     Status: None   Collection Time: 05/24/17  1:35 AM  Result Value Ref Range Status   Specimen Description   Final    BLOOD LEFT HAND Performed at [facility] [address] [place], [place] [address]    Special Requests   Final    BOTTLES DRAWN AEROBIC AND ANAEROBIC Blood Culture adequate volume Performed at [facility] [address] [place], [place] [address]    Culture   Final    NO GROWTH 5 DAYS Performed at [facility], [place] [address] [place], [place] [address]    Report Status 05/29/2017 FINAL  Final  Urine culture     Status: Abnormal   Collection Time: 05/24/17  2:57 AM  Result Value Ref Range Status   Specimen Description   Final    URINE, RANDOM Performed at [facility] [address] [place], [place] [address]    Special Requests   Final    NONE Performed at [facility], [place] [address], [place], [place] [address]    Culture >=100,000 COLONIES/mL STAPHYLOCOCCUS EPIDERMIDIS (A)  Final   Report Status 05/26/2017 FINAL  Final   Organism ID, Bacteria STAPHYLOCOCCUS EPIDERMIDIS (A)  Final      Susceptibility   Staphylococcus epidermidis - MIC*    CIPROFLOXACIN 4 RESISTANT Resistant     GENTAMICIN >=16 RESISTANT Resistant     NITROFURANTOIN <=16 SENSITIVE Sensitive     OXACILLIN >=4 RESISTANT Resistant     TETRACYCLINE <=1 SENSITIVE Sensitive     VANCOMYCIN 1 SENSITIVE Sensitive     TRIMETH/SULFA <=10 SENSITIVE Sensitive     CLINDAMYCIN <=0.25 SENSITIVE Sensitive     RIFAMPIN <=0.5 SENSITIVE Sensitive     Inducible  Clindamycin NEGATIVE Sensitive     * >=100,000 COLONIES/mL STAPHYLOCOCCUS EPIDERMIDIS     SIGNED:   Cordelia Poche, MD [facility] 05/31/2017, 12:52 PM Pager [phone]  If 7PM-7AM, please contact night-coverage [email or web address] Password [email or web address]

## 2017-05-31 NOTE — Progress Notes (Signed)
Nutrition Follow-up  DOCUMENTATION CODES:   Severe malnutrition in context of chronic illness, Underweight  INTERVENTION:   -D/c Ensure Enlive po TID, each supplement provides 350 kcal and 20 grams of protein -Continue MVI daily -Premier Protein TID, each supplement provides 160 kcals and 30 grams protein  NUTRITION DIAGNOSIS:   Severe Malnutrition related to chronic illness, catabolic illness, cancer and cancer related treatments as evidenced by estimated needs.  Ongoing  GOAL:   Patient will meet greater than or equal to 90% of their needs  Progressing  MONITOR:   PO intake, Supplement acceptance, Weight trends, Labs  REASON FOR ASSESSMENT:   Consult Assessment of nutrition requirement/status  ASSESSMENT:   67 y.o. female with medical history significant of metastatic NSCLC on chemotherapy of Keytruda w/ last tx 3/11, HTN, DM type II, hypothyroidism, tobacco abuse; who presented to the emergency department with complaints of worsening generalized weakness, anorexia, and elevated blood sugars  4/3- s/p Procedure(s): ABDOMINAL AORTOGRAM W/LOWER EXTREMITY   Reviewed CWOCN note from 05/27/17; discolored rt fifth toe discolored (dark purple). Per vascular surgery notes, PVD ulcer.   Spoke with pt, who was irritable at time of visit. She expressed frustration to this RD, due to not having any food options that she liked. She reports she consumed french toast for breakfast and has been consuming food that her family has brought in for her. Meal completion 25-100%. Obtained pt food preferences and assisted pt with ordering meal.  Pt states that she does not like the Ensure supplements (pt took a few sips of this morning's dose). She reports she consumes Premier Protein shakes at home, which she prefers. RD modified supplement order per her preference (as well as due to frequently elevated CBGS.   Discussed with pt glycemic control at home. Per pt, "it started getting real high"  recently. Discussed pathophysiology of DM and effects of blood sugar on acute illness and wound healing. Pt reports compliance with medications PTA, but states "I'm just on too many medications". Last Hgb A1c: 8.8 (05/09/17). PTA DM medications 2 mg gliepiride daily, 6 units insulin glargine q HS, 6 units insulin lispro TID before meals, and 500 mg metformin BID. Noted pt with [facility] referral to assist with DM management. Pt may also benefit from an outpatient referral to endocrinology to assist in potentially simplifying DM medication regimen to assist with compliance and optimize control.   Medications reviewed and include ferrous sulfate, megace, and MVI.   Labs reviewed: CBGS: [id number] (inpatient orders for glycemic control are 0-5 units insulin aspart daily at bedtime, 0-9 units insulin aspart TID with meals, 3 units insulin aspart TID with meals, and 8 units insulin glargine daily).  Diet Order:  Diet heart healthy/carb modified Room service appropriate? Yes; Fluid consistency: Thin Diet - low sodium heart healthy  EDUCATION NEEDS:   No education needs have been identified at this time  Skin:  Skin Assessment: Reviewed RN Assessment Skin Integrity Issues:: Other (Comment) Other: rt fifth toe PVD ulcer  Last BM:  05/28/17  Height:   Ht Readings from Last 1 Encounters:  05/28/17 5\' 3"  (1.6 m)    Weight:   Wt Readings from Last 1 Encounters:  05/31/17 93 lb 11.1 oz (42.5 kg)    Ideal Body Weight:  52.27 kg  BMI:  Body mass index is 16.6 kg/m.  Estimated Nutritional Needs:   Kcal:  1500-1700  Protein:  85-100 grams  Fluid:  > 1.5 L    Ellean Firman A. Jimmye Norman, RD, LDN,  CDE Pager: [phone] After hours Pager: [phone] 

## 2017-05-31 NOTE — Discharge Instructions (Addendum)
Gangrene °Gangrene is a medical condition caused by a lack of blood supply to an area of your body. Oxygen travels through your blood, so less blood means less oxygen. Without oxygen, your body tissue will start to die. °Gangrene can affect many parts of your body. Gangrene is most common on the skin (external gangrene), but it can also affect internal parts of the body (internal gangrene). °Gangrene requires emergency treatment. °What are the causes? °Any condition that cuts off blood supply can cause gangrene. Common causes include: °· Injuries. °· Blood vessel disease. °· Diabetes. °· Infections. ° °What increases the risk? °You may be at increased risk for gangrene if you have: °· A serious injury. °· Recent surgery. °· Diabetes. °· A weak body defense system (immune system). °· Narrowing of your arteries (arteriosclerosis). °· An immune system disease that causes your arteries to constrict (Raynaud disease). °· A history of IV drug use. ° °What are the signs or symptoms? °The signs and symptoms depend on what part of your body is affected. If you have external gangrene, you may have: °· Severe pain followed by loss of feeling. °· Redness and swelling. °· Crackling sounds when you press on a swollen area of skin. °· A wound with bad-smelling drainage. °· Changes in the color of your skin. It may turn red, blue, black, or white. °· Fever and chills. ° °If you have internal gangrene, you may have: °· Fever and chills. °· Confusion. °· Dizziness. °· Severe pain. °· Rapid heartbeat and breathing. °· Loss of appetite. °· Nausea or vomiting. ° °How is this diagnosed? °To diagnose gangrene, your health care provider will take your medical history and do a physical exam. Tests may also be done to help in making the diagnosis. These may include: °· X-rays of your blood vessels after injection of a dye (arteriogram). °· Blood tests to look for an infection in your blood. °· Imaging studies such as a CT scan or  MRI. °· Taking a swab from a draining wound to look for bacteria in the laboratory (culture). °· Taking a piece of tissue (biopsy) to look for cell death in the laboratory. °· A surgical procedure to look for gangrene inside your body. ° °How is this treated? °Treatment for gangrene depends on the cause and the area of your body that is affected. Gangrene is usually treated in the hospital. It is very important to start treatment early before gangrene spreads. Treatment may include the following: °· Medicine. You may get high doses of antibiotic medicine for gangrene caused by infection. The antibiotics may be given directly into a vein through an IV tube. °· Surgery. Surgical options may include: °? Debridement. This is surgery to remove dead tissue. You may need to have debridement several times. °? Bypass or angioplasty. This surgery improves the blood supply to an affected area. °? Amputation. Sometimes it is necessary to remove a body part. °· Oxygen therapy. This involves treatment in a chamber designed to provide high levels of oxygen (hyperbaric oxygen therapy). It can improve the oxygen supply to an affected area. °· Supportive care. This may include: °? IV fluids and nutrients. °? Pain medicines. °? Blood thinners to prevent blood clots. ° °Follow these instructions at home: °· Take medicines only as directed by your health care provider. °· If you were prescribed an antibiotic medicine, finish it all even if you start to feel better. °· Clean any cuts or scratches with an antiseptic. °· Cover any open wounds. °·   Check wounds for signs of infection. Watch for redness or swelling. °· Do not use any tobacco products, including cigarettes, chewing tobacco, or electronic cigarettes. If you need help quitting, ask your health care provider. °· Limit alcohol intake to no more than 1 drink per day for nonpregnant women and 2 drinks per day for men. One drink equals 12 ounces of beer, 5 ounces of wine, or 1½  ounces of hard liquor. °· Eat a healthy diet and maintain a healthy weight. °· Get some exercise on most days of the week. Ask your health care provider to suggest activities that are safe for you. °· If you have diabetes or another blood vessel disease, check your feet often for signs of injury or infection. °· After any surgery you have, follow your health care provider's instructions carefully. °· Keep all follow-up visits as directed by your health care provider. This is important. °Contact a health care provider if: °· You have a wound or sore that is not healing. °· You have color changes (white, red, blue, or black) in an area of your skin. °· You have a wound or sore with smelly drainage. °Get help right away if: °· You have rapidly worsening pain at the site of a skin infection or wound. °· You lose sensation at the site of a skin infection or wound. °· You have unexplained: °? Fever. °? Chills. °? Confusion. °? Fainting. °This information is not intended to replace advice given to you by your health care provider. Make sure you discuss any questions you have with your health care provider. °Document Released: 12/15/2003 Document Revised: 07/21/2015 Document Reviewed: 04/08/2013 °Elsevier Interactive Patient Education © 2018 Elsevier Inc. ° °

## 2017-05-31 NOTE — Consult Note (Addendum)
   [place] Inpatient Consult   05/31/2017  Brittany Mercado March 31, 1950 [id number]   Referral received from inpatient [facility] for Brittany Mercado services for transportation and DM Mercado.  Spoke with Brittany Mercado at bedside to discuss and offer [facility] Merc[facility] program services. She is agreeable and written consent obtained. Brittany Mercado folder provided.  Brittany Mercado reports she lives with her son, Brittany Mercado [phone]. Confirms Primary Care MD is Dr. Nani Ravens with Brittany Mercado at Brittany Mercado (office listed as doing transition of care). Brittany Mercado denies having concerns with medications or transportation.  However, inpatient RNCM reports patient's son states transportation assistance is needed. Brittany Mercado does have Medicaid as well.   Explained that Brittany Mercado will not interfere or replace services provided by home health. Inpatient RNCM indicates patient will have [facility] for PT services.  Will make referral for Brittany Mercado for DM education and Mercado and Brittany Mercado Social Worker for transportation concerns.   Inpatient RNCM aware of bedside visit.  Risk of unplanned readmission score is 26%. Medical history significant of metastatic NSCLC on chemotherapy of Keytruda w/ last tx 3/11, HTN, DM type II, hypothyroidism, tobacco abuse.    Brittany Rolling, MSN-Ed, RN,BSN [facility] Liaison [phone]

## 2017-06-03 ENCOUNTER — Encounter: Payer: Self-pay | Admitting: *Deleted

## 2017-06-03 ENCOUNTER — Other Ambulatory Visit: Payer: Self-pay

## 2017-06-03 ENCOUNTER — Other Ambulatory Visit: Payer: Self-pay | Admitting: *Deleted

## 2017-06-03 NOTE — Patient Outreach (Signed)
[facility]) Care Management  06/03/2017  Hurley Blevins Aug 03, 1950 [id number]    Referral received 4/5 RN attempted the initial outerach call today however unsuccessful but able to leave a HIPPA approved voice message requesting a call back. Will further engage at that time for pending [facility] services. Note contacted the son as requested with the message Kasandra Knudsen Reddick) [phone].  Raina Mina, RN Care Management Coordinator [place] Office [phone]

## 2017-06-03 NOTE — Patient Outreach (Signed)
Avalene Sealy 1951/02/13 MRN: [id number]   CSW received patient referral for transportation needs. Per referral from hospital liaison, CSW attempted to contact patients son, Iana Buzan at [phone]. Outreach was unsuccessful. CSW left Mr. Minton a voicemail asking he return call. CSW will attempt to call Mr. Tilson within the next 3-4 days. Unsuccessful outreach letter has also been sent to patient.  Daneen Schick, BSW, CDP Social Worker [place] Mailing Address: [address], [place], [place] [address] Physical Address: [address], [place], [place] [address] Toll Free Main # [phone] Fax # [phone] Cell # [phone] Fax # [phone] [email or web address]      [employer]  Discrimination is Against the [employer]. and its subsidiaries comply with applicable Federal civil rights laws and do not discriminate on the basis of race, color, national origin, age, disability, or sex. Olivia do not exclude people or treat them differently because of race, color, national origin, age, disability, or sex.    [employer]. and its subsidiaries provide:  . Free auxiliary aids and services, such as qualified sign language interpreters, video remote interpretation, and written information in other formats to people with disabilities when such auxiliary aids and services are necessary to ensure an equal opportunity to participate. . Free language services to people whose primary language is not English when those services are necessary to provide meaningful access, such as translated documents or oral interpretation.    If you need these services, call [phone] or if you use a TTY, call 711.   If you believe that [employer]. and its subsidiaries have failed to provide these services or discriminated in another way on the basis of race, color, national origin, age, disability, or sex, you can file a  Tourist information centre manager with:   Discrimination [place] [address]   If you need help filing a grievance, call [phone] or if you use a TTY, call 711.   You can also file a civil rights complaint with the U.S. Department of Health and Financial controller, Office for Civil Rights electronically through the Office for Civil Rights Complaint Portal, available at OnSiteLending.nl.jsf, or by mail or phone at:   [place]. Department of Health and Human Services  [place], [place], [facility]  [place], [place]  [phone], [phone] (TDD)  Complaint forms are available at CutFunds.si [place]: ATTENTION: If you do not speak English, language assistance services, free of charge, are available to you. Call [phone] (TTY: 326).  Espaol (Spanish): ATENCIN: si habla espaol, tiene a su disposicin servicios gratuitos de asistencia lingstica. Llame al [phone]     (TTY: 382). ???? (Chinese): ?????????????????????????????? [phone]?TTY: 711??  Ti?ng Vi?t (Vietnamese): CH : N?u b?n ni Ti?ng Vi?t, c cc d?ch v? h? tr? ngn ng? mi?n ph dnh cho b?n. G?i s? [phone]                                                                                         (TTY: 532).  ??? ([place]): ?? : ???? ????? ?? , ?? ?? ???? ??? ???? ? ???? .  [phone] (TTY: 711)??? ??? ???? .  Tagalog (Tagalog - Filipino): PAUNAWA: Kung nagsasalita ka ng Tagalog, maaari kang gumamit ng mga serbisyo ng tulong sa wika nang walang bayad. [place] sa [phone]                                                                                                                       (TTY: 606).  ??????? [place]union): ????????: ???? ?? ???????? ?? ??????? ?????, ?? ??? ???????? ?????????? ?????? ????????. ??????? [phone]                                                                                                       (????????: 711).  Kreyl Ayisyen ([place]): ATANSYON: Si w pale Ethelene Hal, gen svis d pou lang ki disponib gratis pou ou. Rele [phone]           (TTY: 706).  Fonnie Jarvis Marland KitchenPakistan): ATTENTION : Si vous parlez franais, des services d'aide linguistique vous sont proposs gratuitement. Appelez le [phone]                                                                                              (ATS : 616).  Polski ([place] UWAGA: Je?eli mwisz po polsku, mo?esz skorzysta? z bezp?atnej pomocy j?zykowej. Zadzwo? pod numer [phone]       (TTY: 854).  Portugus ([place]): ATENO: Se fala portugus, encontram-se disponveis servios lingusticos, grtis. Ligue para [phone]      (TTY: 182).  Italiano ([place]): ATTENZIONE: In caso la lingua parlata sia l'italiano, sono disponibili servizi di assistenza linguistica gratuiti. Chiamare il numero [phone]                                                                                                                            (  TTY: 967).  Dawayne Patricia ([place]): ACHTUNG: Wenn Sie Deutsch sprechen, stehen Ihnen kostenlos sprachliche Hilfsdienstleistungen zur [employer]. R[place]num[place]: [phone]                                                                               (TTY: 258).   (Arabic): [phone]   .            : .)614 :   (  ??? ([place]): ??????????????????????????????????[phone] ?TTY?711?????????????????  ? (Farsi): [phone]  . ?   ? ?  ? ? ?~ ?  ?    : .??  (TTY: 711)  Din Bizaad ([place]): D77 baa ak0 n7n7zin: D77 saad bee y1n7[ti'go Risa Grill, saad bee 1k1'1n7da'1wo'd66', t'11 Pricilla Loveless n1 h0l=, koj8' h0d77lnih [phone]             (TTY:   505).

## 2017-06-04 ENCOUNTER — Other Ambulatory Visit: Payer: Self-pay | Admitting: Internal Medicine

## 2017-06-04 DIAGNOSIS — Z5112 Encounter for antineoplastic immunotherapy: Secondary | ICD-10-CM

## 2017-06-04 DIAGNOSIS — J302 Other seasonal allergic rhinitis: Secondary | ICD-10-CM

## 2017-06-04 DIAGNOSIS — C3491 Malignant neoplasm of unspecified part of right bronchus or lung: Secondary | ICD-10-CM

## 2017-06-05 ENCOUNTER — Other Ambulatory Visit: Payer: Self-pay | Admitting: Internal Medicine

## 2017-06-06 ENCOUNTER — Other Ambulatory Visit: Payer: Self-pay

## 2017-06-06 NOTE — Patient Outreach (Signed)
[facility]) Care Management  06/06/2017  Brittany Mercado [birth year] [id number]   Unsuccessful outreach attempt on today's date. CSW attempted to contact patients son, [name] at [phone], as indicated in referral. HIPAA compliant voicemail left requesting a return call. CSW to follow up in the next 3-4 business days.  Daneen Schick, BSW, CDP Social Worker [place] Mailing Address: [address], [place], [place] [address] Physical Address: [address], [place], [place] [address] Toll Free Main # [phone] Fax # [phone] Cell # [phone] Fax # [phone] [email or web address]

## 2017-06-07 ENCOUNTER — Other Ambulatory Visit: Payer: Self-pay

## 2017-06-07 ENCOUNTER — Other Ambulatory Visit: Payer: Self-pay | Admitting: *Deleted

## 2017-06-07 ENCOUNTER — Telehealth: Payer: Self-pay | Admitting: Family Medicine

## 2017-06-07 DIAGNOSIS — Z4801 Encounter for change or removal of surgical wound dressing: Secondary | ICD-10-CM | POA: Diagnosis not present

## 2017-06-07 DIAGNOSIS — I700 Atherosclerosis of aorta: Secondary | ICD-10-CM | POA: Diagnosis not present

## 2017-06-07 DIAGNOSIS — Z794 Long term (current) use of insulin: Secondary | ICD-10-CM | POA: Diagnosis not present

## 2017-06-07 DIAGNOSIS — R918 Other nonspecific abnormal finding of lung field: Secondary | ICD-10-CM | POA: Diagnosis not present

## 2017-06-07 DIAGNOSIS — D500 Iron deficiency anemia secondary to blood loss (chronic): Secondary | ICD-10-CM | POA: Diagnosis not present

## 2017-06-07 DIAGNOSIS — R64 Cachexia: Secondary | ICD-10-CM | POA: Diagnosis not present

## 2017-06-07 DIAGNOSIS — I10 Essential (primary) hypertension: Secondary | ICD-10-CM | POA: Diagnosis not present

## 2017-06-07 DIAGNOSIS — Z95820 Peripheral vascular angioplasty status with implants and grafts: Secondary | ICD-10-CM | POA: Diagnosis not present

## 2017-06-07 DIAGNOSIS — I2510 Atherosclerotic heart disease of native coronary artery without angina pectoris: Secondary | ICD-10-CM | POA: Diagnosis not present

## 2017-06-07 DIAGNOSIS — D7389 Other diseases of spleen: Secondary | ICD-10-CM | POA: Diagnosis not present

## 2017-06-07 DIAGNOSIS — E0851 Diabetes mellitus due to underlying condition with diabetic peripheral angiopathy without gangrene: Secondary | ICD-10-CM | POA: Diagnosis not present

## 2017-06-07 DIAGNOSIS — Z7982 Long term (current) use of aspirin: Secondary | ICD-10-CM | POA: Diagnosis not present

## 2017-06-07 DIAGNOSIS — E0865 Diabetes mellitus due to underlying condition with hyperglycemia: Secondary | ICD-10-CM | POA: Diagnosis not present

## 2017-06-07 DIAGNOSIS — Z7902 Long term (current) use of antithrombotics/antiplatelets: Secondary | ICD-10-CM | POA: Diagnosis not present

## 2017-06-07 DIAGNOSIS — E43 Unspecified severe protein-calorie malnutrition: Secondary | ICD-10-CM | POA: Diagnosis not present

## 2017-06-07 DIAGNOSIS — R627 Adult failure to thrive: Secondary | ICD-10-CM | POA: Diagnosis not present

## 2017-06-07 DIAGNOSIS — Z85118 Personal history of other malignant neoplasm of bronchus and lung: Secondary | ICD-10-CM | POA: Diagnosis not present

## 2017-06-07 DIAGNOSIS — E039 Hypothyroidism, unspecified: Secondary | ICD-10-CM | POA: Diagnosis not present

## 2017-06-07 DIAGNOSIS — L988 Other specified disorders of the skin and subcutaneous tissue: Secondary | ICD-10-CM | POA: Diagnosis not present

## 2017-06-07 DIAGNOSIS — J432 Centrilobular emphysema: Secondary | ICD-10-CM | POA: Diagnosis not present

## 2017-06-07 DIAGNOSIS — Z48812 Encounter for surgical aftercare following surgery on the circulatory system: Secondary | ICD-10-CM | POA: Diagnosis not present

## 2017-06-07 DIAGNOSIS — E279 Disorder of adrenal gland, unspecified: Secondary | ICD-10-CM | POA: Diagnosis not present

## 2017-06-07 DIAGNOSIS — R161 Splenomegaly, not elsewhere classified: Secondary | ICD-10-CM | POA: Diagnosis not present

## 2017-06-07 DIAGNOSIS — K219 Gastro-esophageal reflux disease without esophagitis: Secondary | ICD-10-CM | POA: Diagnosis not present

## 2017-06-07 NOTE — Patient Outreach (Signed)
[place] [facility] [facility]) Care Management  06/07/2017  Brittany Mercado 03-Apr-1950 [id number]    RN 2nd outreach attempt to reach pt however unsuccessful however RN able to leave a HIPAA approved voice message requesting a call back. Will continue another outreach attempt over the next week for [facility] services.  Raina Mina, RN Care Management Coordinator [place] Office [phone]

## 2017-06-07 NOTE — Telephone Encounter (Signed)
Copied from [place] [phone]. Topic: Quick Communication - See Telephone Encounter >> Jun 07, 2017 11:43 AM Ether Griffins B wrote: CRM for notification. See Telephone encounter for: 06/07/17.  Chris PT with [facility] calling requesting: PT 1x 1 week 2x 3 weeks 1x 3 week; skilled nursing evaluation for diabetic, gang green treatment, and failure to thrive; also evaluation for social worker. CB# [phone].

## 2017-06-07 NOTE — Telephone Encounter (Signed)
Called [facility] gave him approval per PCP.

## 2017-06-07 NOTE — Patient Outreach (Signed)
[facility] [facility]) Care Management  06/07/2017  Brittany Mercado 1950-11-15 [id number]  CSW received call from patients son, Tayden Duran in regards to referral for patient and transportation needs. HIPAA identifiers verified.   Mr. Stenglein stated he was in need of transportation services for his mother. CSW inquired on status of patients medicaid. Mr. Novakovich stated patients medicaid is active. CSW educated Mr. Ehrsam on [place] transportation benefit. CSW provided patients son with medicaid transportation contact information. CSW communicated to Mr. Godsey that he would need to contact medicaid at least 3 days in advance to schedule services. Mr. Haskins communicated that patients next doctor appointment was April 22nd. Mr. Betton communicated confidence in ability to schedule appointment in a timely manner.  Patients son also expressed concern that his mother needed an in-home aide to help with ADL's like dressing and checking her blood sugar. Mr. Markert stated his mother needs someone to motivate her and encourage her to get out of bed. He is concerned that while he is at work she is not taking care of herself like she needs to.   CSW spoke with patients son about PCS services through [facility]. Mr. Gao expressed interest in applying for PCS services. CSW plans to contact Mr. Overbaugh within the next week to verify follow through with medicaid transportation as well as to complete PCS application.  Daneen Schick, BSW, CDP Social Worker [place] Mailing Address: [address], [place], [place] [address] Physical Address: [address], [place], [place] [address] Toll Free Main # [phone] Fax # [phone] Cell # [phone] Fax # [phone] [email or web address]

## 2017-06-09 DIAGNOSIS — R64 Cachexia: Secondary | ICD-10-CM | POA: Diagnosis not present

## 2017-06-09 DIAGNOSIS — Z7902 Long term (current) use of antithrombotics/antiplatelets: Secondary | ICD-10-CM | POA: Diagnosis not present

## 2017-06-09 DIAGNOSIS — K219 Gastro-esophageal reflux disease without esophagitis: Secondary | ICD-10-CM | POA: Diagnosis not present

## 2017-06-09 DIAGNOSIS — R161 Splenomegaly, not elsewhere classified: Secondary | ICD-10-CM | POA: Diagnosis not present

## 2017-06-09 DIAGNOSIS — C3490 Malignant neoplasm of unspecified part of unspecified bronchus or lung: Secondary | ICD-10-CM | POA: Diagnosis not present

## 2017-06-09 DIAGNOSIS — R918 Other nonspecific abnormal finding of lung field: Secondary | ICD-10-CM | POA: Diagnosis not present

## 2017-06-09 DIAGNOSIS — E279 Disorder of adrenal gland, unspecified: Secondary | ICD-10-CM | POA: Diagnosis not present

## 2017-06-09 DIAGNOSIS — I2510 Atherosclerotic heart disease of native coronary artery without angina pectoris: Secondary | ICD-10-CM | POA: Diagnosis not present

## 2017-06-09 DIAGNOSIS — Z48812 Encounter for surgical aftercare following surgery on the circulatory system: Secondary | ICD-10-CM | POA: Diagnosis not present

## 2017-06-09 DIAGNOSIS — Z95820 Peripheral vascular angioplasty status with implants and grafts: Secondary | ICD-10-CM | POA: Diagnosis not present

## 2017-06-09 DIAGNOSIS — E0865 Diabetes mellitus due to underlying condition with hyperglycemia: Secondary | ICD-10-CM | POA: Diagnosis not present

## 2017-06-09 DIAGNOSIS — E0810 Diabetes mellitus due to underlying condition with ketoacidosis without coma: Secondary | ICD-10-CM | POA: Diagnosis not present

## 2017-06-09 DIAGNOSIS — I6789 Other cerebrovascular disease: Secondary | ICD-10-CM | POA: Diagnosis not present

## 2017-06-09 DIAGNOSIS — E875 Hyperkalemia: Secondary | ICD-10-CM | POA: Diagnosis not present

## 2017-06-09 DIAGNOSIS — E039 Hypothyroidism, unspecified: Secondary | ICD-10-CM | POA: Diagnosis not present

## 2017-06-09 DIAGNOSIS — Z880 Allergy status to penicillin: Secondary | ICD-10-CM | POA: Diagnosis not present

## 2017-06-09 DIAGNOSIS — E0851 Diabetes mellitus due to underlying condition with diabetic peripheral angiopathy without gangrene: Secondary | ICD-10-CM | POA: Diagnosis not present

## 2017-06-09 DIAGNOSIS — Z87891 Personal history of nicotine dependence: Secondary | ICD-10-CM | POA: Diagnosis not present

## 2017-06-09 DIAGNOSIS — E1165 Type 2 diabetes mellitus with hyperglycemia: Secondary | ICD-10-CM | POA: Diagnosis not present

## 2017-06-09 DIAGNOSIS — G8191 Hemiplegia, unspecified affecting right dominant side: Secondary | ICD-10-CM | POA: Diagnosis not present

## 2017-06-09 DIAGNOSIS — I700 Atherosclerosis of aorta: Secondary | ICD-10-CM | POA: Diagnosis not present

## 2017-06-09 DIAGNOSIS — Z79899 Other long term (current) drug therapy: Secondary | ICD-10-CM | POA: Diagnosis not present

## 2017-06-09 DIAGNOSIS — E1110 Type 2 diabetes mellitus with ketoacidosis without coma: Secondary | ICD-10-CM | POA: Diagnosis not present

## 2017-06-09 DIAGNOSIS — D500 Iron deficiency anemia secondary to blood loss (chronic): Secondary | ICD-10-CM | POA: Diagnosis not present

## 2017-06-09 DIAGNOSIS — D7389 Other diseases of spleen: Secondary | ICD-10-CM | POA: Diagnosis not present

## 2017-06-09 DIAGNOSIS — Z794 Long term (current) use of insulin: Secondary | ICD-10-CM | POA: Diagnosis not present

## 2017-06-09 DIAGNOSIS — Z7982 Long term (current) use of aspirin: Secondary | ICD-10-CM | POA: Diagnosis not present

## 2017-06-09 DIAGNOSIS — J432 Centrilobular emphysema: Secondary | ICD-10-CM | POA: Diagnosis not present

## 2017-06-09 DIAGNOSIS — Z85118 Personal history of other malignant neoplasm of bronchus and lung: Secondary | ICD-10-CM | POA: Diagnosis not present

## 2017-06-09 DIAGNOSIS — Z4801 Encounter for change or removal of surgical wound dressing: Secondary | ICD-10-CM | POA: Diagnosis not present

## 2017-06-09 DIAGNOSIS — Z79890 Hormone replacement therapy (postmenopausal): Secondary | ICD-10-CM | POA: Diagnosis not present

## 2017-06-09 DIAGNOSIS — R627 Adult failure to thrive: Secondary | ICD-10-CM | POA: Diagnosis not present

## 2017-06-09 DIAGNOSIS — R4701 Aphasia: Secondary | ICD-10-CM | POA: Diagnosis not present

## 2017-06-09 DIAGNOSIS — E1065 Type 1 diabetes mellitus with hyperglycemia: Secondary | ICD-10-CM | POA: Diagnosis not present

## 2017-06-09 DIAGNOSIS — E1010 Type 1 diabetes mellitus with ketoacidosis without coma: Secondary | ICD-10-CM | POA: Diagnosis not present

## 2017-06-09 DIAGNOSIS — E43 Unspecified severe protein-calorie malnutrition: Secondary | ICD-10-CM | POA: Diagnosis not present

## 2017-06-09 DIAGNOSIS — L988 Other specified disorders of the skin and subcutaneous tissue: Secondary | ICD-10-CM | POA: Diagnosis not present

## 2017-06-09 DIAGNOSIS — I10 Essential (primary) hypertension: Secondary | ICD-10-CM | POA: Diagnosis not present

## 2017-06-10 ENCOUNTER — Telehealth: Payer: Self-pay | Admitting: *Deleted

## 2017-06-10 ENCOUNTER — Other Ambulatory Visit: Payer: Self-pay

## 2017-06-10 DIAGNOSIS — Z880 Allergy status to penicillin: Secondary | ICD-10-CM | POA: Diagnosis not present

## 2017-06-10 DIAGNOSIS — Z7982 Long term (current) use of aspirin: Secondary | ICD-10-CM | POA: Diagnosis not present

## 2017-06-10 DIAGNOSIS — E875 Hyperkalemia: Secondary | ICD-10-CM | POA: Diagnosis not present

## 2017-06-10 DIAGNOSIS — R64 Cachexia: Secondary | ICD-10-CM | POA: Diagnosis not present

## 2017-06-10 DIAGNOSIS — Z7902 Long term (current) use of antithrombotics/antiplatelets: Secondary | ICD-10-CM | POA: Diagnosis not present

## 2017-06-10 DIAGNOSIS — Z794 Long term (current) use of insulin: Secondary | ICD-10-CM | POA: Diagnosis not present

## 2017-06-10 DIAGNOSIS — Z85118 Personal history of other malignant neoplasm of bronchus and lung: Secondary | ICD-10-CM | POA: Diagnosis not present

## 2017-06-10 DIAGNOSIS — E039 Hypothyroidism, unspecified: Secondary | ICD-10-CM | POA: Diagnosis not present

## 2017-06-10 DIAGNOSIS — E1110 Type 2 diabetes mellitus with ketoacidosis without coma: Secondary | ICD-10-CM | POA: Diagnosis not present

## 2017-06-10 DIAGNOSIS — Z87891 Personal history of nicotine dependence: Secondary | ICD-10-CM | POA: Diagnosis not present

## 2017-06-10 DIAGNOSIS — Z79899 Other long term (current) drug therapy: Secondary | ICD-10-CM | POA: Diagnosis not present

## 2017-06-10 DIAGNOSIS — R4701 Aphasia: Secondary | ICD-10-CM | POA: Diagnosis not present

## 2017-06-10 NOTE — Patient Outreach (Signed)
[facility]) Care Management  06/10/2017  Brittany Mercado 1950-04-23 [id number]   CSW received a voicemail from patients son over the weekend. CSW followed up with patients son per voicemail request. HIPAA identifiers confirmed.  Brittany Mercado reports patient had to return to the hospital on the night of 06/09/17 due to "high blood sugar".  Brittany Mercado states he is unsure when his mother will be able to return home.   CSW notified RN CM assigned to patient as well as hospital liaison. CSW to follow up with Brittany Mercado later in the week.  Brittany Mercado, BSW, CDP Social Worker [place] Mailing Address: [address] [place], [place] [address] Physical Address: [address], [place], [place] [address] Toll Free Main # [phone] Fax # [phone] Cell # [phone] Fax # [phone] Brittany [email or web address]

## 2017-06-10 NOTE — Telephone Encounter (Signed)
[employer] note received via fax  Call: [place] [facility]   Date:06/09/2017 Time:07:23pm  Patient Name: "Unknown", Rietz  Caller:Laquane, nurse [facility] Return [phone]  Nurse: Cipriano Mile with [facility]  Chief Complaint:Hyperglycemia  Reason for call:Request to speak to Physician  Related visit to physician within the last 2 weeks: [Last PCP 05/16/17], [place] 05/23/17  [place] from [place] [phone] says she id with the PT and her sugar is reading 554. PT is not responding well. She is going to send her to the ER. 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 Caller declined triage. Says she needs to go take care of the PT."  Disposition:  "Record placed for paging although is incomplete so that the call is notified."

## 2017-06-11 ENCOUNTER — Encounter: Payer: Self-pay | Admitting: Family Medicine

## 2017-06-11 ENCOUNTER — Ambulatory Visit: Payer: Self-pay

## 2017-06-11 ENCOUNTER — Other Ambulatory Visit: Payer: Self-pay | Admitting: *Deleted

## 2017-06-11 DIAGNOSIS — Z87891 Personal history of nicotine dependence: Secondary | ICD-10-CM | POA: Diagnosis not present

## 2017-06-11 DIAGNOSIS — Z7902 Long term (current) use of antithrombotics/antiplatelets: Secondary | ICD-10-CM | POA: Diagnosis not present

## 2017-06-11 DIAGNOSIS — E039 Hypothyroidism, unspecified: Secondary | ICD-10-CM | POA: Diagnosis not present

## 2017-06-11 DIAGNOSIS — E1110 Type 2 diabetes mellitus with ketoacidosis without coma: Secondary | ICD-10-CM | POA: Diagnosis not present

## 2017-06-11 DIAGNOSIS — Z880 Allergy status to penicillin: Secondary | ICD-10-CM | POA: Diagnosis not present

## 2017-06-11 DIAGNOSIS — Z79899 Other long term (current) drug therapy: Secondary | ICD-10-CM | POA: Diagnosis not present

## 2017-06-11 DIAGNOSIS — R4701 Aphasia: Secondary | ICD-10-CM | POA: Diagnosis not present

## 2017-06-11 DIAGNOSIS — Z85118 Personal history of other malignant neoplasm of bronchus and lung: Secondary | ICD-10-CM | POA: Diagnosis not present

## 2017-06-11 DIAGNOSIS — E875 Hyperkalemia: Secondary | ICD-10-CM | POA: Diagnosis not present

## 2017-06-11 DIAGNOSIS — R64 Cachexia: Secondary | ICD-10-CM | POA: Diagnosis not present

## 2017-06-11 DIAGNOSIS — Z794 Long term (current) use of insulin: Secondary | ICD-10-CM | POA: Diagnosis not present

## 2017-06-11 DIAGNOSIS — Z7982 Long term (current) use of aspirin: Secondary | ICD-10-CM | POA: Diagnosis not present

## 2017-06-11 MED ORDER — GENERIC EXTERNAL MEDICATION
5.00 | Status: DC
Start: 2017-06-12 — End: 2017-06-11

## 2017-06-11 MED ORDER — GLUCOSE 40 % PO GEL
15.00 g | ORAL | Status: DC
Start: ? — End: 2017-06-11

## 2017-06-11 MED ORDER — ASPIRIN 81 MG PO CHEW
81.00 | CHEWABLE_TABLET | ORAL | Status: DC
Start: 2017-06-12 — End: 2017-06-11

## 2017-06-11 MED ORDER — LEVOTHYROXINE SODIUM 100 MCG PO TABS
100.00 | ORAL_TABLET | ORAL | Status: DC
Start: 2017-06-12 — End: 2017-06-11

## 2017-06-11 MED ORDER — HEPARIN SODIUM (PORCINE) 5000 UNIT/ML IJ SOLN
5000.00 | INTRAMUSCULAR | Status: DC
Start: 2017-06-11 — End: 2017-06-11

## 2017-06-11 MED ORDER — GUAIFENESIN 100 MG/5ML PO SYRP
200.00 | ORAL_SOLUTION | ORAL | Status: DC
Start: ? — End: 2017-06-11

## 2017-06-11 MED ORDER — DEXTROSE 50 % IV SOLN
12.00 g | INTRAVENOUS | Status: DC
Start: ? — End: 2017-06-11

## 2017-06-11 MED ORDER — INSULIN LISPRO 100 UNIT/ML ~~LOC~~ SOLN
1.00 | SUBCUTANEOUS | Status: DC
Start: 2017-06-11 — End: 2017-06-11

## 2017-06-11 MED ORDER — CLOPIDOGREL BISULFATE 75 MG PO TABS
75.00 | ORAL_TABLET | ORAL | Status: DC
Start: 2017-06-12 — End: 2017-06-11

## 2017-06-11 MED ORDER — GENERIC EXTERNAL MEDICATION
1.00 | Status: DC
Start: ? — End: 2017-06-11

## 2017-06-11 MED ORDER — MAGNESIUM OXIDE 400 MG PO TABS
400.00 | ORAL_TABLET | ORAL | Status: DC
Start: 2017-06-11 — End: 2017-06-11

## 2017-06-11 MED ORDER — LORATADINE 10 MG PO TABS
10.00 | ORAL_TABLET | ORAL | Status: DC
Start: 2017-06-12 — End: 2017-06-11

## 2017-06-11 NOTE — Consult Note (Signed)
Received notification of patient's hospital admission. However, unable to determine where patient was admitted to a [facility] facility in [facility].   Made [facility] team aware of this.  Marthenia Rolling, MSN-Ed, RN,BSN [facility] Liaison [phone]

## 2017-06-12 ENCOUNTER — Encounter: Payer: Self-pay | Admitting: Family Medicine

## 2017-06-12 ENCOUNTER — Ambulatory Visit: Payer: Self-pay | Admitting: *Deleted

## 2017-06-12 ENCOUNTER — Ambulatory Visit (INDEPENDENT_AMBULATORY_CARE_PROVIDER_SITE_OTHER): Payer: Medicare Other | Admitting: Family Medicine

## 2017-06-12 VITALS — BP 118/72 | HR 87 | Temp 97.3°F | Ht 63.0 in | Wt 85.2 lb

## 2017-06-12 DIAGNOSIS — L97519 Non-pressure chronic ulcer of other part of right foot with unspecified severity: Secondary | ICD-10-CM

## 2017-06-12 DIAGNOSIS — E43 Unspecified severe protein-calorie malnutrition: Secondary | ICD-10-CM | POA: Diagnosis not present

## 2017-06-12 DIAGNOSIS — Z794 Long term (current) use of insulin: Secondary | ICD-10-CM | POA: Diagnosis not present

## 2017-06-12 DIAGNOSIS — Z7902 Long term (current) use of antithrombotics/antiplatelets: Secondary | ICD-10-CM | POA: Diagnosis not present

## 2017-06-12 DIAGNOSIS — E279 Disorder of adrenal gland, unspecified: Secondary | ICD-10-CM | POA: Diagnosis not present

## 2017-06-12 DIAGNOSIS — D7389 Other diseases of spleen: Secondary | ICD-10-CM | POA: Diagnosis not present

## 2017-06-12 DIAGNOSIS — R161 Splenomegaly, not elsewhere classified: Secondary | ICD-10-CM | POA: Diagnosis not present

## 2017-06-12 DIAGNOSIS — R64 Cachexia: Secondary | ICD-10-CM | POA: Diagnosis not present

## 2017-06-12 DIAGNOSIS — K219 Gastro-esophageal reflux disease without esophagitis: Secondary | ICD-10-CM | POA: Diagnosis not present

## 2017-06-12 DIAGNOSIS — E0865 Diabetes mellitus due to underlying condition with hyperglycemia: Secondary | ICD-10-CM | POA: Diagnosis not present

## 2017-06-12 DIAGNOSIS — I2510 Atherosclerotic heart disease of native coronary artery without angina pectoris: Secondary | ICD-10-CM | POA: Diagnosis not present

## 2017-06-12 DIAGNOSIS — E1110 Type 2 diabetes mellitus with ketoacidosis without coma: Secondary | ICD-10-CM

## 2017-06-12 DIAGNOSIS — D500 Iron deficiency anemia secondary to blood loss (chronic): Secondary | ICD-10-CM | POA: Diagnosis not present

## 2017-06-12 DIAGNOSIS — L988 Other specified disorders of the skin and subcutaneous tissue: Secondary | ICD-10-CM | POA: Diagnosis not present

## 2017-06-12 DIAGNOSIS — I10 Essential (primary) hypertension: Secondary | ICD-10-CM | POA: Diagnosis not present

## 2017-06-12 DIAGNOSIS — Z48812 Encounter for surgical aftercare following surgery on the circulatory system: Secondary | ICD-10-CM | POA: Diagnosis not present

## 2017-06-12 DIAGNOSIS — J432 Centrilobular emphysema: Secondary | ICD-10-CM | POA: Diagnosis not present

## 2017-06-12 DIAGNOSIS — I700 Atherosclerosis of aorta: Secondary | ICD-10-CM | POA: Diagnosis not present

## 2017-06-12 DIAGNOSIS — E039 Hypothyroidism, unspecified: Secondary | ICD-10-CM | POA: Diagnosis not present

## 2017-06-12 DIAGNOSIS — R627 Adult failure to thrive: Secondary | ICD-10-CM | POA: Diagnosis not present

## 2017-06-12 DIAGNOSIS — Z95820 Peripheral vascular angioplasty status with implants and grafts: Secondary | ICD-10-CM | POA: Diagnosis not present

## 2017-06-12 DIAGNOSIS — Z85118 Personal history of other malignant neoplasm of bronchus and lung: Secondary | ICD-10-CM | POA: Diagnosis not present

## 2017-06-12 DIAGNOSIS — Z4801 Encounter for change or removal of surgical wound dressing: Secondary | ICD-10-CM | POA: Diagnosis not present

## 2017-06-12 DIAGNOSIS — E0851 Diabetes mellitus due to underlying condition with diabetic peripheral angiopathy without gangrene: Secondary | ICD-10-CM | POA: Diagnosis not present

## 2017-06-12 DIAGNOSIS — R918 Other nonspecific abnormal finding of lung field: Secondary | ICD-10-CM | POA: Diagnosis not present

## 2017-06-12 DIAGNOSIS — Z7982 Long term (current) use of aspirin: Secondary | ICD-10-CM | POA: Diagnosis not present

## 2017-06-12 MED ORDER — DICLOFENAC SODIUM 1 % TD GEL
TRANSDERMAL | 1 refills | Status: AC
Start: 2017-06-12 — End: ?

## 2017-06-12 NOTE — Patient Instructions (Addendum)
On nights you eat less, use 8 units of Lantus. On nights you eat more, use 10 units of Lantus.   Based on what you have told me, let's do 5 units when you are eating 20 g of carbs and continue with 3 if you are eating 10 g of carbs per meal.   Record your carb load and the amount of insulin you are using.   Call your vascular surgery team today!  I called in a new topical pain medication. Don't fill it if it is too expensive.   Let us know if you need anything.

## 2017-06-12 NOTE — Telephone Encounter (Signed)
Copied from [place] [phone]. Topic: Quick Communication - See Telephone Encounter >> Jun 07, 2017 11:43 AM Ether Griffins B wrote: CRM for notification. See Telephone encounter for: 06/07/17.  Chris PT with [facility] calling requesting: PT 1x 1 week 2x 3 weeks 1x 3 week; skilled nursing evaluation for diabetic, gang green treatment, and failure to thrive; also evaluation for social worker. CB# [phone].  >> Jun 12, 2017  4:48 PM Vernona Rieger wrote: Gerald Stabs said that she was in the hospital & that he missed a visit. He wants to know can these orders be re-done and get verbal approval?  Called [facility] left detailed message of verbal ok per PCP

## 2017-06-12 NOTE — Progress Notes (Addendum)
Chief Complaint  Patient presents with  . Hospitalization Follow-up    HPI Brittany Mercado is a 67 y.o. y.o. female who presents for a transition of care visit.  Pt was discharged from [facility] on 06/11/17.  She presents the following day.  Pt was admitted for elevated sugars.  She is currently on basal bolus insulin, 8 units at night of Lantus and 3 units NovoLog with meals.  She states she does count carbs and notices when she eats her usual 10 g with meals, 3 units will produce a postprandial reading of approximately 80 and she feels normal.  Similarly, when she does not eat a large meal in the evening, the 8 units of Lantus will provide a similar number in the AM, w/o s/s's.  When she eats a large meal, this usually consists of around 20 carbohydrates.  Her sugars will typically be in the 200s at this time.  She reports compliance with her other medication.  She has yet to contact the vascular surgery team after gangrene on her right foot.  She does have the information at home and promises to call today.  Past Medical History:  Diagnosis Date  . Adenocarcinoma of right lung, stage 4 ([place]) 11/30/2015  . Adult failure to thrive   . Complication of anesthesia    hard to wake up  . Diabetes mellitus type 2 in nonobese (HCC)   . Eczema   . Encounter for antineoplastic chemotherapy 11/30/2015  . Heart murmur    told at age 14 but never has had any problems  . History of radiation therapy 01/26/16-02/07/16   right pelvis 30 Gy in 10 fractions  . HTN (hypertension) 02/01/2016  . 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 Iron deficiency anemia due to chronic blood loss 12/10/2016   Family History  Problem Relation Age of Onset  . Colon cancer Neg Hx   . Esophageal cancer Neg Hx   . Rectal cancer Neg Hx   . Stomach cancer Neg Hx    Allergies as of 06/12/2017      Reactions   Penicillins Swelling   SWELLING REACTION UNSPECIFIED  Has patient had a PCN reaction causing immediate rash,  facial/tongue/throat swelling, SOB or lightheadedness with hypotension: Yes Has patient had a PCN reaction causing severe rash involving mucus membranes or skin necrosis: No Has patient had a PCN reaction that required hospitalization No Has patient had a PCN reaction occurring within the last 10 years: No If all of the above answers are "NO", then may proceed with Cephalosporin use.      Medication List        Accurate as of 06/12/17 12:30 PM. Always use your most recent med list.          aspirin 81 MG EC tablet Take 1 tablet (81 mg total) by mouth daily.   blood glucose meter kit and supplies Kit Dispense based on patient and insurance preference. Use up to four times daily as directed. (FOR ICD-9 250.00, 250.01).   cetirizine 10 MG tablet Commonly known as:  ZYRTEC TAKE 1 TABLET(10 MG) BY MOUTH DAILY   clopidogrel 75 MG tablet Commonly known as:  PLAVIX Take 1 tablet (75 mg total) by mouth daily with breakfast.   diclofenac sodium 1 % Gel Commonly known as:  VOLTAREN Apply layer to chest 4 times daily as needed for pain.   famotidine 10 MG chewable tablet Commonly known as:  PEPCID AC Chew 10 mg by mouth daily as needed for heartburn.  ferrous sulfate 325 (65 FE) MG tablet Take 325 mg by mouth daily.   Insulin Glargine 100 UNIT/ML Solostar Pen Commonly known as:  LANTUS Inject 8 Units into the skin daily at 10 pm.   insulin lispro 100 UNIT/ML KiwkPen Commonly known as:  HUMALOG Inject 0.03 mLs (3 Units total) into the skin 3 (three) times daily with meals.   Insulin Pen Needle 31G X 8 MM Misc Commonly known as:  B-D ULTRAFINE III SHORT PEN USE AS DIRECTED WITH INSULIN   levothyroxine 100 MCG tablet Commonly known as:  SYNTHROID, LEVOTHROID TAKE 1 TABLET(100 MCG) BY MOUTH DAILY BEFORE BREAKFAST   megestrol 625 MG/5ML suspension Commonly known as:  MEGACE ES Take 5 mLs (625 mg total) by mouth daily.   meloxicam 15 MG tablet Commonly known as:   MOBIC TAKE 1 TABLET(15 MG) BY MOUTH DAILY   metFORMIN 500 MG tablet Commonly known as:  GLUCOPHAGE Take 2 tablets (1,000 mg total) by mouth 2 (two) times daily with a meal. (take as written in dc summary)   multivitamin tablet Take 1 tablet by mouth daily.   ondansetron 4 MG tablet Commonly known as:  ZOFRAN Take 4 mg by mouth every 8 (eight) hours as needed for nausea or vomiting.   protein supplement shake Liqd Commonly known as:  PREMIER PROTEIN Take 325 mLs (11 oz total) by mouth daily.       ROS:  Constitutional: No fevers or chills, no weight loss HEENT: No headaches, hearing loss, or runny nose, no sore throat Heart: +chest wall pain Lungs: No SOB, no cough Abd: No bowel changes, no pain, no N/V GU: No urinary complaints Neuro: No numbness, tingling or weakness Msk: +R foot pain, otherwise no joint or muscle pain  Objective BP 118/72 (BP Location: Right Arm, Patient Position: Sitting, Cuff Size: Normal)   Pulse 87   Temp (!) 97.3 F (36.3 C) (Oral)   Ht 5' 3"  (1.6 m)   Wt 85 lb 4 oz (38.7 kg)   SpO2 95%   BMI 15.10 kg/m  General Appearance:  awake, alert, oriented, cachectic Skin: +ulceration on foot Head/face:  NCAT Eyes:  EOMI, PERRLA Ears:  canals and TMs NI Nose/Sinuses:  negative Mouth/Throat:  Mucosa moist, no lesions; pharynx without erythema, edema or exudate. Neck:  neck- supple, no mass, non-tender and no jvd Lungs: Clear to auscultation.  No rales, rhonchi, or wheezing. Normal effort, no accessory muscle use. Heart:  Heart sounds are normal.  Regular rate and rhythm without murmur, gallop or rub. No bruits. Abdomen:  BS+, soft, NT, ND, no masses or organomegaly Musculoskeletal:  No muscle group atrophy or asymmetry, gait normal Neurologic:  Alert and oriented x 3, gait normal., reflexes normal and symmetric, strength and  sensation grossly normal Psych exam: Nml mood and affect, age appropriate judgment and insight  Type 2 diabetes mellitus  with ketoacidosis without coma, with long-term current use of insulin (HCC)  Ulcer of toe of right foot, unspecified ulcer stage [facility])  Discharge summary and medication list have been reviewed/reconciled.  Labs pending at the time of discharge have been reviewed or are still pending at the time of this visit.  Follow-up labs and appointments have been ordered and/or coordinated appropriately. Educational materials regarding the patient's admitting diagnosis provided.  TRANSITIONAL CARE MANAGEMENT CERTIFICATION:  I certify the following are true:   1. Communication with the patient/care giver was made within 2 business days of discharge.  2. Complexity of Medical decision making is  moderate.  3. Face to face visit occurred within 14 days of discharge.  Will ck on referrals.  8 un Lantus when she is not eating large meals, 10 units when she is. 3 units of short acting when she eats 10 g, 5 when she consumes 20 g meals. Will devise a sliding scale if she starts to deviate from this eating habit.  F/u in 1 week to recheck sugars.  The patient voiced understanding and agreement to the plan.  [place], DO 06/12/17 12:30 PM

## 2017-06-12 NOTE — Progress Notes (Signed)
Pre visit review using our clinic review tool, if applicable. No additional management support is needed unless otherwise documented below in the visit note. 

## 2017-06-12 NOTE — Addendum Note (Signed)
Addended by: Ames Coupe on: 06/12/2017 12:32 PM   Modules accepted: Level of Service

## 2017-06-13 ENCOUNTER — Other Ambulatory Visit: Payer: Self-pay

## 2017-06-13 NOTE — Patient Outreach (Signed)
[facility]) Care Management  06/13/2017  Brittany Mercado Jun 27, 1950 [id number]  CSW spoke with patients son, [name] to follow up on patient status from hospitalization. HIPAA identifiers confirmed. [name] states patient is back at home and doing well.  CSW spoke with [name] about PCS services and application process. CSW completed patient portion of PCS application. Fax sent to Dr. Nani Ravens for completion.  Plan: CSW to follow up within the next two weeks with [name] updating on application process.  Daneen Schick, BSW, CDP Social Worker Cell # [phone] [email or web address]

## 2017-06-14 ENCOUNTER — Encounter: Payer: Self-pay | Admitting: *Deleted

## 2017-06-14 ENCOUNTER — Other Ambulatory Visit: Payer: Self-pay | Admitting: *Deleted

## 2017-06-14 DIAGNOSIS — R627 Adult failure to thrive: Secondary | ICD-10-CM | POA: Diagnosis not present

## 2017-06-14 DIAGNOSIS — K219 Gastro-esophageal reflux disease without esophagitis: Secondary | ICD-10-CM | POA: Diagnosis not present

## 2017-06-14 DIAGNOSIS — Z794 Long term (current) use of insulin: Secondary | ICD-10-CM | POA: Diagnosis not present

## 2017-06-14 DIAGNOSIS — I700 Atherosclerosis of aorta: Secondary | ICD-10-CM | POA: Diagnosis not present

## 2017-06-14 DIAGNOSIS — E43 Unspecified severe protein-calorie malnutrition: Secondary | ICD-10-CM | POA: Diagnosis not present

## 2017-06-14 DIAGNOSIS — E0865 Diabetes mellitus due to underlying condition with hyperglycemia: Secondary | ICD-10-CM | POA: Diagnosis not present

## 2017-06-14 DIAGNOSIS — Z85118 Personal history of other malignant neoplasm of bronchus and lung: Secondary | ICD-10-CM | POA: Diagnosis not present

## 2017-06-14 DIAGNOSIS — D7389 Other diseases of spleen: Secondary | ICD-10-CM | POA: Diagnosis not present

## 2017-06-14 DIAGNOSIS — E0851 Diabetes mellitus due to underlying condition with diabetic peripheral angiopathy without gangrene: Secondary | ICD-10-CM | POA: Diagnosis not present

## 2017-06-14 DIAGNOSIS — Z7982 Long term (current) use of aspirin: Secondary | ICD-10-CM | POA: Diagnosis not present

## 2017-06-14 DIAGNOSIS — Z7902 Long term (current) use of antithrombotics/antiplatelets: Secondary | ICD-10-CM | POA: Diagnosis not present

## 2017-06-14 DIAGNOSIS — Z48812 Encounter for surgical aftercare following surgery on the circulatory system: Secondary | ICD-10-CM | POA: Diagnosis not present

## 2017-06-14 DIAGNOSIS — D500 Iron deficiency anemia secondary to blood loss (chronic): Secondary | ICD-10-CM | POA: Diagnosis not present

## 2017-06-14 DIAGNOSIS — E279 Disorder of adrenal gland, unspecified: Secondary | ICD-10-CM | POA: Diagnosis not present

## 2017-06-14 DIAGNOSIS — I10 Essential (primary) hypertension: Secondary | ICD-10-CM | POA: Diagnosis not present

## 2017-06-14 DIAGNOSIS — E039 Hypothyroidism, unspecified: Secondary | ICD-10-CM | POA: Diagnosis not present

## 2017-06-14 DIAGNOSIS — Z4801 Encounter for change or removal of surgical wound dressing: Secondary | ICD-10-CM | POA: Diagnosis not present

## 2017-06-14 DIAGNOSIS — I2510 Atherosclerotic heart disease of native coronary artery without angina pectoris: Secondary | ICD-10-CM | POA: Diagnosis not present

## 2017-06-14 DIAGNOSIS — Z95820 Peripheral vascular angioplasty status with implants and grafts: Secondary | ICD-10-CM | POA: Diagnosis not present

## 2017-06-14 DIAGNOSIS — J432 Centrilobular emphysema: Secondary | ICD-10-CM | POA: Diagnosis not present

## 2017-06-14 DIAGNOSIS — R161 Splenomegaly, not elsewhere classified: Secondary | ICD-10-CM | POA: Diagnosis not present

## 2017-06-14 DIAGNOSIS — L988 Other specified disorders of the skin and subcutaneous tissue: Secondary | ICD-10-CM | POA: Diagnosis not present

## 2017-06-14 DIAGNOSIS — R64 Cachexia: Secondary | ICD-10-CM | POA: Diagnosis not present

## 2017-06-14 DIAGNOSIS — R918 Other nonspecific abnormal finding of lung field: Secondary | ICD-10-CM | POA: Diagnosis not present

## 2017-06-14 NOTE — Patient Outreach (Signed)
[facility]) Care Management  06/14/2017  Aleigh Grunden 23-Feb-1951 [id number]    Transition of care (discipline closure)  RN spoke with the consented son Dymon Summerhill who indicates pt is at home but current he is at his place. RN introduced the [facility] services once again and purpose for today's call. Inquired on pt's possible needs. Epic notes indicated the social worker is involved attempt to assist pt further with some of her current needs. RN offered further assistance and offered to review all discharge orders and review pt's medications. Son indicates he is not as prepared and does not have this information. Caregiver states he is the responsible party for this pt but will be going out of town for a week and the pt will be staying at her sister's home in [place] while he is away. RN stress the importance of pt's follow up appointment with her primary provider and this was verified that the pt followed up on 4/17. Also states pt has all her medications but again does not have her list of medication. RN offered any assistance with any of pt's medical conditions however son not sure. Son offered to contact [facility] for this nurse case manager if needed when he returns to town (son agreed). Note [facility] has sent pt an outreach letter and RN provided contact name and number if this caregiver would like further involvement from a nurse case manager. RN will informed team involved and update pt's provider of pt's disposition with this discipline (son is aware). No further inquires or request at this time.  Raina Mina, RN Care Management Coordinator [place] Office [phone]

## 2017-06-17 ENCOUNTER — Inpatient Hospital Stay: Payer: Medicare Other

## 2017-06-17 ENCOUNTER — Inpatient Hospital Stay (HOSPITAL_BASED_OUTPATIENT_CLINIC_OR_DEPARTMENT_OTHER): Payer: Medicare Other | Admitting: Internal Medicine

## 2017-06-17 ENCOUNTER — Telehealth: Payer: Self-pay | Admitting: Internal Medicine

## 2017-06-17 ENCOUNTER — Inpatient Hospital Stay: Payer: Medicare Other | Attending: Oncology

## 2017-06-17 ENCOUNTER — Inpatient Hospital Stay: Payer: Medicare Other | Admitting: Nutrition

## 2017-06-17 ENCOUNTER — Telehealth: Payer: Self-pay

## 2017-06-17 VITALS — BP 153/56 | HR 58 | Temp 97.5°F | Resp 22 | Ht 63.0 in | Wt 83.7 lb

## 2017-06-17 DIAGNOSIS — E039 Hypothyroidism, unspecified: Secondary | ICD-10-CM | POA: Diagnosis not present

## 2017-06-17 DIAGNOSIS — Z5112 Encounter for antineoplastic immunotherapy: Secondary | ICD-10-CM | POA: Insufficient documentation

## 2017-06-17 DIAGNOSIS — C3491 Malignant neoplasm of unspecified part of right bronchus or lung: Secondary | ICD-10-CM

## 2017-06-17 DIAGNOSIS — E1110 Type 2 diabetes mellitus with ketoacidosis without coma: Secondary | ICD-10-CM | POA: Insufficient documentation

## 2017-06-17 DIAGNOSIS — C3431 Malignant neoplasm of lower lobe, right bronchus or lung: Secondary | ICD-10-CM | POA: Insufficient documentation

## 2017-06-17 DIAGNOSIS — E1111 Type 2 diabetes mellitus with ketoacidosis with coma: Secondary | ICD-10-CM | POA: Diagnosis not present

## 2017-06-17 DIAGNOSIS — I10 Essential (primary) hypertension: Secondary | ICD-10-CM

## 2017-06-17 DIAGNOSIS — R5382 Chronic fatigue, unspecified: Secondary | ICD-10-CM

## 2017-06-17 DIAGNOSIS — Z794 Long term (current) use of insulin: Secondary | ICD-10-CM

## 2017-06-17 DIAGNOSIS — E119 Type 2 diabetes mellitus without complications: Secondary | ICD-10-CM

## 2017-06-17 LAB — COMPREHENSIVE METABOLIC PANEL
ALT: 13 U/L (ref 0–55)
AST: 18 U/L (ref 5–34)
Albumin: 2.8 g/dL — ABNORMAL LOW (ref 3.5–5.0)
Alkaline Phosphatase: 175 U/L — ABNORMAL HIGH (ref 40–150)
Anion gap: 14 — ABNORMAL HIGH (ref 3–11)
BILIRUBIN TOTAL: 0.4 mg/dL (ref 0.2–1.2)
BUN: 6 mg/dL — AB (ref 7–26)
CHLORIDE: 101 mmol/L (ref 98–109)
CO2: 20 mmol/L — ABNORMAL LOW (ref 22–29)
CREATININE: 0.94 mg/dL (ref 0.60–1.10)
Calcium: 8.8 mg/dL (ref 8.4–10.4)
GFR calc Af Amer: >60 mL/min (ref >60)
Glucose, Bld: 308 mg/dL — ABNORMAL HIGH (ref 70–140)
POTASSIUM: 3.8 mmol/L (ref 3.5–5.1)
Sodium: 135 mmol/L — ABNORMAL LOW (ref 136–145)
TOTAL PROTEIN: 6.9 g/dL (ref 6.4–8.3)

## 2017-06-17 LAB — CBC WITH DIFFERENTIAL/PLATELET
BASOS ABS: 0 10*3/uL (ref 0.0–0.1)
Basophils Relative: 1 %
EOS PCT: 2 %
Eosinophils Absolute: 0.1 10*3/uL (ref 0.0–0.5)
HEMATOCRIT: 29.4 % — AB (ref 34.8–46.6)
Hemoglobin: 9.8 g/dL — ABNORMAL LOW (ref 11.6–15.9)
LYMPHS ABS: 1.3 10*3/uL (ref 0.9–3.3)
LYMPHS PCT: 17 %
MCH: 29.1 pg (ref 25.1–34.0)
MCHC: 33.2 g/dL (ref 31.5–36.0)
MCV: 87.7 fL (ref 79.5–101.0)
MONO ABS: 0.5 10*3/uL (ref 0.1–0.9)
MONOS PCT: 7 %
NEUTROS ABS: 5.4 10*3/uL (ref 1.5–6.5)
Neutrophils Relative %: 73 %
PLATELETS: 400 10*3/uL (ref 145–400)
RBC: 3.36 MIL/uL — ABNORMAL LOW (ref 3.70–5.45)
RDW: 18.5 % — AB (ref 11.2–14.5)
WBC: 7.3 10*3/uL (ref 3.9–10.3)

## 2017-06-17 LAB — TSH: TSH: 21.504 u[IU]/mL — AB (ref 0.308–3.960)

## 2017-06-17 MED ORDER — SODIUM CHLORIDE 0.9 % IV SOLN
Freq: Once | INTRAVENOUS | Status: AC
  Administered 2017-06-17: 15:00:00 via INTRAVENOUS

## 2017-06-17 MED ORDER — INSULIN REGULAR HUMAN 100 UNIT/ML IJ SOLN
10.0000 [IU] | Freq: Once | INTRAMUSCULAR | Status: DC
  Administered 2017-06-17: 10 [IU] via SUBCUTANEOUS
  Filled 2017-06-17: qty 0.1

## 2017-06-17 MED ORDER — SODIUM CHLORIDE 0.9 % IV SOLN
200.0000 mg | Freq: Once | INTRAVENOUS | Status: AC
  Administered 2017-06-17: 200 mg via INTRAVENOUS
  Filled 2017-06-17: qty 8

## 2017-06-17 NOTE — Telephone Encounter (Signed)
Scheduled appt per 4/22 los - Gave patient AVS and calender per los. [employer] to contact patient with referral

## 2017-06-17 NOTE — Progress Notes (Signed)
[facility] [phone]   [phone]  OFFICE PROGRESS NOTE  [name], DO [address] [address]  DIAGNOSIS: Stage IV (T1b, N2, M1 B) non-small cell lung cancer, adenocarcinoma with positive PDL 1 expression (99%), presented with right lower lobe lung nodule with metastatic right hilar and subcarinal lymphadenopathy as well as bone metastasis diagnosed in September 2017.  Genomic Alterations Identified? NF1 G374f*9 ARID1B [id number] PBRM1 [id number] TP53 P72f4f Additional Findings? Microsatellite status MS-Stable Tumor Mutation Burden TMB-Intermediate; 7 Muts/Mb Additional Disease-relevant Genes with No Reportable Alterations Identified? EGFR KRAS ALK BRAF MET ERBB2 RET ROS1  PRIOR THERAPY: Palliative radiotherapy to the metastatic bone lesion under the care of Dr. KinaSondra ComeURRENT THERAPY: First-line treatment with immunotherapy with Ketruda (pembrolizumab) 200 mg IV every 3 weeks status post 24 cycles. First cycle was given 12/21/2015.  INTERVAL HISTORY: Brittany Mercado. female returns to the clinic today for follow-up visit accompanied by her sister.  The patient is feeling fine today with no specific complaints.  Unfortunately her blood sugar has always been elevated even at home.  She is currently managed by her primary care physician but her blood glucose is still not well controlled.  She was admitted to the hospital twice recently was diabetic ketoacidosis.  She is currently undergoing physical therapy at home.  She denied having any chest pain, shortness breath, cough or hemoptysis.  She denied having any fever or chills.  She has no nausea, vomiting, diarrhea or constipation.  She is here today for evaluation before starting cycle #25 of her treatment with Keytruda.  MEDICAL HISTORY: Past Medical History:  Diagnosis Date  . Adenocarcinoma of right lung, stage 4 (HCC)[place]/05/2015  .  Adult failure to thrive   . Complication of anesthesia    hard to wake up  . Diabetes mellitus type 2 in nonobese (HCC)   . Eczema   . Encounter for antineoplastic chemotherapy 11/30/2015  . Heart murmur    told at age 38 b25 never has had any problems  . History of radiation therapy 01/26/16-02/07/16   right pelvis 30 Gy in 10 fractions  . HTN (hypertension) 02/01/2016  . 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 Iron deficiency anemia due to chronic blood loss 12/10/2016    ALLERGIES:  is allergic to penicillins.  MEDICATIONS:  Current Outpatient Medications  Medication Sig Dispense Refill  . aspirin EC 81 MG EC tablet Take 1 tablet (81 mg total) by mouth daily. 30 tablet 0  . blood glucose meter kit and supplies KIT Dispense based on patient and insurance preference. Use up to four times daily as directed. (FOR ICD-9 250.00, 250.01). 1 each 0  . cetirizine (ZYRTEC) 10 MG tablet TAKE 1 TABLET(10 MG) BY MOUTH DAILY 30 tablet 1  . clopidogrel (PLAVIX) 75 MG tablet Take 1 tablet (75 mg total) by mouth daily with breakfast. 30 tablet 0  . diclofenac sodium (VOLTAREN) 1 % GEL Apply layer to chest 4 times daily as needed for pain. 1 Tube 1  . famotidine (PEPCID AC) 10 MG chewable tablet Chew 10 mg by mouth daily as needed for heartburn.    . ferrous sulfate 325 (65 FE) MG tablet Take 325 mg by mouth daily.    . Insulin Glargine (LANTUS) 100 UNIT/ML Solostar Pen Inject 8 Units into the skin daily at 10 pm. 15 mL 0  . insulin lispro (HUMALOG) 100 UNIT/ML KiwkPen Inject 0.03 mLs (3  Units total) into the skin 3 (three) times daily with meals. 15 mL 11  . Insulin Pen Needle (B-D ULTRAFINE III SHORT PEN) 31G X 8 MM MISC USE AS DIRECTED WITH INSULIN 100 each 2  . levothyroxine (SYNTHROID, LEVOTHROID) 100 MCG tablet TAKE 1 TABLET(100 MCG) BY MOUTH DAILY BEFORE BREAKFAST 30 tablet 0  . megestrol (MEGACE ES) 625 MG/5ML suspension Take 5 mLs (625 mg total) by mouth daily. 150 mL 2  . meloxicam (MOBIC)  15 MG tablet TAKE 1 TABLET(15 MG) BY MOUTH DAILY 90 tablet 0  . metFORMIN (GLUCOPHAGE) 500 MG tablet Take 2 tablets (1,000 mg total) by mouth 2 (two) times daily with a meal. (take as written in dc summary) 120 tablet 0  . Multiple Vitamin (MULTIVITAMIN) tablet Take 1 tablet by mouth daily.    . ondansetron (ZOFRAN) 4 MG tablet Take 4 mg by mouth every 8 (eight) hours as needed for nausea or vomiting.    . protein supplement shake (PREMIER PROTEIN) LIQD Take 325 mLs (11 oz total) by mouth daily. 12 Can 3   No current facility-administered medications for this visit.     SURGICAL HISTORY:  Past Surgical History:  Procedure Laterality Date  . ABDOMINAL AORTOGRAM W/LOWER EXTREMITY N/A 05/29/2017   Procedure: ABDOMINAL AORTOGRAM W/LOWER EXTREMITY;  Surgeon: Serafina [facility], MD;  Location: [facility];  Service: Cardiovascular;  Laterality: N/A;  . ABDOMINAL HYSTERECTOMY    . OOPHORECTOMY  1996   tumor removed from right ovary  . PERIPHERAL VASCULAR ATHERECTOMY  05/29/2017   Procedure: PERIPHERAL VASCULAR ATHERECTOMY;  Surgeon: Serafina [facility], MD;  Location: [facility];  Service: Cardiovascular;;  right superficiacl femoral  . TONSILLECTOMY    . VIDEO BRONCHOSCOPY WITH ENDOBRONCHIAL NAVIGATION N/A 11/18/2015   Procedure: VIDEO BRONCHOSCOPY WITH ENDOBRONCHIAL NAVIGATION;  Surgeon: Melrose Nakayama, MD;  Location: [facility];  Service: Thoracic;  Laterality: N/A;  . VIDEO BRONCHOSCOPY WITH ENDOBRONCHIAL ULTRASOUND N/A 11/18/2015   Procedure: VIDEO BRONCHOSCOPY WITH ENDOBRONCHIAL ULTRASOUND;  Surgeon: Melrose Nakayama, MD;  Location: [facility];  Service: Thoracic;  Laterality: N/A;    REVIEW OF SYSTEMS:  A comprehensive review of systems was negative except for: Constitutional: positive for fatigue and weight loss Musculoskeletal: positive for bone pain   PHYSICAL EXAMINATION: General appearance: alert, cooperative, fatigued and no distress Head: Normocephalic, without obvious  abnormality, atraumatic Neck: no adenopathy, no JVD, supple, symmetrical, trachea midline and thyroid not enlarged, symmetric, no tenderness/mass/nodules Lymph nodes: Cervical, supraclavicular, and axillary nodes normal. Resp: clear to auscultation bilaterally Back: symmetric, no curvature. ROM normal. No CVA tenderness. Cardio: normal apical impulse GI: soft, non-tender; bowel sounds normal; no masses,  no organomegaly Extremities: extremities normal, atraumatic, no cyanosis or edema  ECOG PERFORMANCE STATUS: 1 - Symptomatic but completely ambulatory  Blood pressure (!) 153/56, pulse (!) 58, temperature (!) 97.5 F (36.4 C), temperature source Oral, resp. rate (!) 22, height 5' 3"  (1.6 m), weight 83 lb 11.2 oz (38 kg), SpO2 97 %.  LABORATORY DATA: Lab Results  Component Value Date   WBC 7.3 06/17/2017   HGB 9.8 (L) 06/17/2017   HCT 29.4 (L) 06/17/2017   MCV 87.7 06/17/2017   PLT 400 06/17/2017      Chemistry      Component Value Date/Time   NA 135 (L) 06/17/2017 1331   NA 140 02/08/2017 1241   K 3.8 06/17/2017 1331   K 4.3 02/08/2017 1241   CL 101 06/17/2017 1331   CO2 20 (  L) 06/17/2017 1331   CO2 24 02/08/2017 1241   BUN 6 (L) 06/17/2017 1331   BUN 13.3 02/08/2017 1241   CREATININE 0.94 06/17/2017 1331   CREATININE 1.0 02/08/2017 1241      Component Value Date/Time   CALCIUM 8.8 06/17/2017 1331   CALCIUM 9.5 02/08/2017 1241   ALKPHOS 175 (H) 06/17/2017 1331   ALKPHOS 395 (H) 02/08/2017 1241   AST 18 06/17/2017 1331   AST 61 (H) 02/08/2017 1241   ALT 13 06/17/2017 1331   ALT 53 02/08/2017 1241   BILITOT 0.4 06/17/2017 1331   BILITOT 0.26 02/08/2017 1241       RADIOGRAPHIC STUDIES: Dg Chest 2 View  Result Date: 05/23/2017 CLINICAL DATA:  Shortness of breath. Dyspnea. Chest pain. Right lung carcinoma. EXAM: CHEST - 2 VIEW COMPARISON:  Chest CT on 05/21/2017 and chest radiograph on 05/08/2017 FINDINGS: Heart size remains normal.  Aortic atherosclerosis.  Pulmonary hyperinflation is again seen, consistent with COPD. Asymmetric soft tissue prominence in the right hilum is unchanged consistent with lymphadenopathy. Mass seen in superior segment of right lower lobe on recent chest CT is not well visualized on this exam. No evidence of pleural effusion or pneumothorax. IMPRESSION: Stable COPD and right hilar lymphadenopathy. No acute findings. See recent chest CT report. Electronically Signed   By: Earle Gell M.D.   On: 05/23/2017 20:57   Ct Chest W Contrast  Result Date: 05/21/2017 CLINICAL DATA:  Restaging lung cancer chronic cough. Initial diagnosis October 2017. EXAM: CT CHEST, ABDOMEN, AND PELVIS WITH CONTRAST TECHNIQUE: Multidetector CT imaging of the chest, abdomen and pelvis was performed following the standard protocol during bolus administration of intravenous contrast. CONTRAST:  18m ISOVUE-300 IOPAMIDOL (ISOVUE-300) INJECTION 61% COMPARISON:  Multiple prior examinations. The most recent is 03/22/2017. FINDINGS: CT CHEST FINDINGS Cardiovascular: The heart is normal in size. No pericardial effusion. The aorta is normal in caliber. Minimal scattered atherosclerotic calcifications. No dissection. Stable coronary artery calcifications. Mediastinum/Nodes: Interval enlargement of the necrotic appearing right hilar/infrahilar mass. This measures 3.0 x 2.8 cm on image number 33 and previously measured 1.6 x 1.5 cm. Findings consistent with recurrent tumor. Stable left infrahilar lymph node on image number 30 measuring 6 mm. No enlarged mediastinal lymph nodes. Lungs/Pleura: Stable advanced emphysematous changes and apical pleural and parenchymal scarring. Stable 3.5 mm subpleural pulmonary nodule in the right upper lobe on image number 66. Necrotic appearing right lower lobe peripheral pulmonary nodule on image number 72 measures 18.5 mm and previously measured 18.5 mm. No new pulmonary lesions. No acute pulmonary findings or pleural effusion. Musculoskeletal:  No breast masses, supraclavicular or axillary adenopathy. Small scattered lymph nodes are stable. No significant bony findings. CT ABDOMEN PELVIS FINDINGS Hepatobiliary: No focal hepatic lesions to suggest metastatic disease. The gallbladder is normal. No common bile duct dilatation. Pancreas: No mass, inflammation or ductal dilatation. A 6 mm cystic lesion was identified on the prior CT scan but no lesion is seen on today's study. This could be a small fatty cleft. Spleen: Normal size.  No focal lesions. Adrenals/Urinary Tract: Enlarging right adrenal gland lesion. This measures 31.5 x 21 mm on series 7, image 2 this measured 27 x 18 mm on the prior examination. The kidneys are unremarkable.  The bladder is normal. Stomach/Bowel: The stomach, duodenum, small bowel and colon are grossly normal without oral contrast. No inflammatory changes, mass lesions or obstructive findings. Vascular/Lymphatic: Advanced aortic and iliac artery calcifications but no aneurysm or dissection. The major venous structures are patent.  Small scattered mesenteric and retroperitoneal lymph nodes but no mass or overt adenopathy. Reproductive: Surgically absent. Other: No pelvic mass or adenopathy. No free pelvic fluid collections. No inguinal mass or adenopathy. No abdominal wall hernia or subcutaneous lesions. Musculoskeletal: No significant bony findings. IMPRESSION: 1. Enlarging necrotic appearing right infrahilar adenopathy. 2. Stable right lower lobe peripheral pulmonary nodule and no new pulmonary lesions. 3. Stable advanced emphysematous changes and pulmonary scarring. 4. Slight interval enlargement of the right adrenal gland mass. 5. The 6 mm pancreatic cystic lesion is no longer identified for certain. 6. Stable advanced emphysematous changes and pulmonary scarring. Electronically Signed   By: Marijo Sanes M.D.   On: 05/21/2017 14:17   Ct Abdomen Pelvis W Contrast  Result Date: 05/21/2017 CLINICAL DATA:  Restaging lung cancer  chronic cough. Initial diagnosis October 2017. EXAM: CT CHEST, ABDOMEN, AND PELVIS WITH CONTRAST TECHNIQUE: Multidetector CT imaging of the chest, abdomen and pelvis was performed following the standard protocol during bolus administration of intravenous contrast. CONTRAST:  28m ISOVUE-300 IOPAMIDOL (ISOVUE-300) INJECTION 61% COMPARISON:  Multiple prior examinations. The most recent is 03/22/2017. FINDINGS: CT CHEST FINDINGS Cardiovascular: The heart is normal in size. No pericardial effusion. The aorta is normal in caliber. Minimal scattered atherosclerotic calcifications. No dissection. Stable coronary artery calcifications. Mediastinum/Nodes: Interval enlargement of the necrotic appearing right hilar/infrahilar mass. This measures 3.0 x 2.8 cm on image number 33 and previously measured 1.6 x 1.5 cm. Findings consistent with recurrent tumor. Stable left infrahilar lymph node on image number 30 measuring 6 mm. No enlarged mediastinal lymph nodes. Lungs/Pleura: Stable advanced emphysematous changes and apical pleural and parenchymal scarring. Stable 3.5 mm subpleural pulmonary nodule in the right upper lobe on image number 66. Necrotic appearing right lower lobe peripheral pulmonary nodule on image number 72 measures 18.5 mm and previously measured 18.5 mm. No new pulmonary lesions. No acute pulmonary findings or pleural effusion. Musculoskeletal: No breast masses, supraclavicular or axillary adenopathy. Small scattered lymph nodes are stable. No significant bony findings. CT ABDOMEN PELVIS FINDINGS Hepatobiliary: No focal hepatic lesions to suggest metastatic disease. The gallbladder is normal. No common bile duct dilatation. Pancreas: No mass, inflammation or ductal dilatation. A 6 mm cystic lesion was identified on the prior CT scan but no lesion is seen on today's study. This could be a small fatty cleft. Spleen: Normal size.  No focal lesions. Adrenals/Urinary Tract: Enlarging right adrenal gland lesion. This  measures 31.5 x 21 mm on series 7, image 2 this measured 27 x 18 mm on the prior examination. The kidneys are unremarkable.  The bladder is normal. Stomach/Bowel: The stomach, duodenum, small bowel and colon are grossly normal without oral contrast. No inflammatory changes, mass lesions or obstructive findings. Vascular/Lymphatic: Advanced aortic and iliac artery calcifications but no aneurysm or dissection. The major venous structures are patent. Small scattered mesenteric and retroperitoneal lymph nodes but no mass or overt adenopathy. Reproductive: Surgically absent. Other: No pelvic mass or adenopathy. No free pelvic fluid collections. No inguinal mass or adenopathy. No abdominal wall hernia or subcutaneous lesions. Musculoskeletal: No significant bony findings. IMPRESSION: 1. Enlarging necrotic appearing right infrahilar adenopathy. 2. Stable right lower lobe peripheral pulmonary nodule and no new pulmonary lesions. 3. Stable advanced emphysematous changes and pulmonary scarring. 4. Slight interval enlargement of the right adrenal gland mass. 5. The 6 mm pancreatic cystic lesion is no longer identified for certain. 6. Stable advanced emphysematous changes and pulmonary scarring. Electronically Signed   By: PRicky StabsD.  On: 05/21/2017 14:17    ASSESSMENT AND PLAN:  This is a very pleasant 67 years old African-American female with metastatic non-small cell lung cancer, adenocarcinoma with positive PDL 1 expression of 99%. She is currently undergoing treatment with Ketruda 200 mg IV every 3 weeks, status post 24 cycles. The patient continues to tolerate the immunotherapy fairly well.  Will proceed with cycle #25 today. I will see her back for follow-up visit in 3 weeks for evaluation before starting cycle #26. For the uncontrolled diabetes mellitus, I will give the patient a dose of regular insulin 10 units in the clinic today.  I will also refer the patient to endocrinology for evaluation and  management of her condition because of the frequent episodes of diabetic ketoacidosis. For hypertension, she was advised to monitor her blood pressure closely at home and to consult with her primary care physician for adjustment of her medication. The patient was advised to call immediately if she has any concerning symptoms in the interval. The patient voices understanding of current disease status and treatment options and is in agreement with the current care plan. All questions were answered. The patient knows to call the clinic with any problems, questions or concerns. We can certainly see the patient much sooner if necessary.  Disclaimer: This note was dictated with voice recognition software. Similar sounding words can inadvertently be transcribed and may not be corrected upon review.

## 2017-06-17 NOTE — Patient Instructions (Signed)
[facility] Discharge Instructions for Patients Receiving Chemotherapy  Today you received the following chemotherapy agents Beryle Flock   To help prevent nausea and vomiting after your treatment, we encourage you to take your nausea medication as directed  If you develop nausea and vomiting that is not controlled by your nausea medication, call the clinic.   BELOW ARE SYMPTOMS THAT SHOULD BE REPORTED IMMEDIATELY:  *FEVER GREATER THAN 100.5 F  *CHILLS WITH OR WITHOUT FEVER  NAUSEA AND VOMITING THAT IS NOT CONTROLLED WITH YOUR NAUSEA MEDICATION  *UNUSUAL SHORTNESS OF BREATH  *UNUSUAL BRUISING OR BLEEDING  TENDERNESS IN MOUTH AND THROAT WITH OR WITHOUT PRESENCE OF ULCERS  *URINARY PROBLEMS  *BOWEL PROBLEMS  UNUSUAL RASH Items with * indicate a potential emergency and should be followed up as soon as possible.  Feel free to call the clinic you have any questions or concerns. The clinic phone number is [phone].

## 2017-06-17 NOTE — Telephone Encounter (Signed)
-----   Message from Margy Clarks, LPN sent at 04/11/863  4:13 PM EDT ----- Regarding: Post op apt Contact: [phone] Please check and see if this pt needs a post op apt.  Thank you

## 2017-06-17 NOTE — Progress Notes (Signed)
67 year old female diagnosed with Lung cancer and is a patient of Dr. [name].  PMH includes DM2, HTN, Hypothyroidism.  Medications include Zofran.  Labs were reviewed.  Height: 63 inches. Weight: 83.7 pounds. UBW: 100 pounds. BMI: 14.83  Patient is cachectic appearing. She endorse weight loss and muscle loss. Denies N, V, C, or D. She drinks Premier Protein once daily. States she is lactose intolerant. Occasionally will drink ensure or boost. Cannot provide dietary recall. Refuses nutrition focused physical exam.  Nutrition Diagnosis: Unintended weight loss related to lung cancer and associated treatments as evidenced by 17 pound weight loss from UBW.  Intervention: Educated patient to consume 3 meals with snacks daily. Reviewed high calorie and high protein foods. Recommended patient try to add calories and explained the difference in supplements. Teach back method used. Contact information provided.  Monitoring, Evaluation, Goals: Patient will increase calories and protein to minimize further weight loss.  Next Visit: Will follow as needed.

## 2017-06-17 NOTE — Telephone Encounter (Signed)
LVM for pt stating that the post op appt for here is not needed according to CSD notation in the system on 05/30/17

## 2017-06-18 ENCOUNTER — Telehealth: Payer: Self-pay | Admitting: *Deleted

## 2017-06-18 NOTE — Telephone Encounter (Signed)
Received Physician Orders from [facility]/Social Work [place]. Ward, CMSW for Independent Assessment for PCS, form to be completed and faxed to [employer]; forwarded to provider/SLS 04/23

## 2017-06-18 NOTE — Telephone Encounter (Signed)
Duplicate-Opened in Error/SLS

## 2017-06-19 ENCOUNTER — Ambulatory Visit (INDEPENDENT_AMBULATORY_CARE_PROVIDER_SITE_OTHER): Payer: Medicare Other | Admitting: Family Medicine

## 2017-06-19 ENCOUNTER — Encounter: Payer: Self-pay | Admitting: Family Medicine

## 2017-06-19 ENCOUNTER — Other Ambulatory Visit: Payer: Self-pay

## 2017-06-19 VITALS — BP 104/60 | HR 88 | Temp 97.7°F | Ht 63.0 in | Wt 83.0 lb

## 2017-06-19 DIAGNOSIS — R0789 Other chest pain: Secondary | ICD-10-CM | POA: Diagnosis not present

## 2017-06-19 DIAGNOSIS — E119 Type 2 diabetes mellitus without complications: Secondary | ICD-10-CM | POA: Diagnosis not present

## 2017-06-19 MED ORDER — OXYCODONE-ACETAMINOPHEN 10-325 MG PO TABS: 1.0000 | ORAL_TABLET | Freq: Two times a day (BID) | ORAL | 0 refills | Status: DC | PRN

## 2017-06-19 NOTE — Patient Outreach (Signed)
[facility]) Care Management  06/19/2017  Brittany Mercado Jun 28, 1950 [id number]  BSW contacted Dr. Irene Limbo office to follow up on the status of patients PCS application for an in home aide. Admin assistant, Delsa Sale, confirmed application was recevied. Awaiting completion from Dr. Nani Ravens.  Plan: BSW to follow up in the next 3-4 business days.  Daneen Schick, BSW, CDP Social Worker Cell # [phone] [email or web address]

## 2017-06-19 NOTE — Patient Instructions (Addendum)
We are increasing the dose of your insulin to 10 units nightly. Every 3 days, if your morning sugar is higher than 150, increase this by 1 unit.   Continue taking insulin with meals at current rate/plan.   Continue checking sugars.   Let us know if you need anything.

## 2017-06-19 NOTE — Progress Notes (Signed)
Pre visit review using our clinic review tool, if applicable. No additional management support is needed unless otherwise documented below in the visit note. 

## 2017-06-19 NOTE — Progress Notes (Signed)
Chief Complaint  Patient presents with  . Follow-up    one week    Subjective: Patient is a 67 y.o. female here for DM f/u.  Pt recently dx'd w dm and having difficulty controlling sugars. Reports compliance with insulin and dm meds. I referred her to endo on 3/21, but it appears their office has been having difficulty getting ahold of her. Her oncologist saw her and referred her. Currently she is on 8 u Lantus nightly and 3 u Humalog with small meals and 6 u w larger meals. She does not know how to count carbs and does not wish to.  Sugars have been running in the mid 100s during the day after meals and 200-300s in the morning.  Pt continues to have R sided chest wall pain. Nothing has been helpful. She has stage IV lung cancer.     ROS: Heart: +Chest wall pain Lungs: Denies SOB   Family History  Problem Relation Age of Onset  . Colon cancer Neg Hx   . Esophageal cancer Neg Hx   . Rectal cancer Neg Hx   . Stomach cancer Neg Hx    Past Medical History:  Diagnosis Date  . Adenocarcinoma of right lung, stage 4 ([place]) 11/30/2015  . Adult failure to thrive   . Complication of anesthesia    hard to wake up  . Diabetes mellitus type 2 in nonobese ([facility])   . Eczema   . Encounter for antineoplastic chemotherapy 11/30/2015  . Heart murmur    told at age 67 but never has had any problems  . History of radiation therapy 01/26/16-02/07/16   right pelvis 30 Gy in 10 fractions  . HTN (hypertension) 02/01/2016  . 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 Iron deficiency anemia due to chronic blood loss 12/10/2016   Allergies  Allergen Reactions  . Penicillins Swelling    SWELLING REACTION UNSPECIFIED  Has patient had a PCN reaction causing immediate rash, facial/tongue/throat swelling, SOB or lightheadedness with hypotension: Yes Has patient had a PCN reaction causing severe rash involving mucus membranes or skin necrosis: No Has patient had a PCN reaction that required hospitalization  No Has patient had a PCN reaction occurring within the last 10 years: No If all of the above answers are "NO", then may proceed with Cephalosporin use.    Current Outpatient Medications:  .  aspirin EC 81 MG EC tablet, Take 1 tablet (81 mg total) by mouth daily., Disp: 30 tablet, Rfl: 0 .  blood glucose meter kit and supplies KIT, Dispense based on patient and insurance preference. Use up to four times daily as directed. (FOR ICD-9 250.00, 250.01)., Disp: 1 each, Rfl: 0 .  cetirizine (ZYRTEC) 10 MG tablet, TAKE 1 TABLET(10 MG) BY MOUTH DAILY, Disp: 30 tablet, Rfl: 1 .  clopidogrel (PLAVIX) 75 MG tablet, Take 1 tablet (75 mg total) by mouth daily with breakfast., Disp: 30 tablet, Rfl: 0 .  diclofenac sodium (VOLTAREN) 1 % GEL, Apply layer to chest 4 times daily as needed for pain., Disp: 1 Tube, Rfl: 1 .  famotidine (PEPCID AC) 10 MG chewable tablet, Chew 10 mg by mouth daily as needed for heartburn., Disp: , Rfl:  .  ferrous sulfate 325 (65 FE) MG tablet, Take 325 mg by mouth daily., Disp: , Rfl:  .  Insulin Glargine (LANTUS) 100 UNIT/ML Solostar Pen, Inject 10 Units into the skin daily at 10 pm., Disp: 15 mL, Rfl: 0 .  insulin lispro (HUMALOG) 100 UNIT/ML KiwkPen, Inject 0.03  mLs (3 Units total) into the skin 3 (three) times daily with meals., Disp: 15 mL, Rfl: 11 .  Insulin Pen Needle (B-D ULTRAFINE III SHORT PEN) 31G X 8 MM MISC, USE AS DIRECTED WITH INSULIN, Disp: 100 each, Rfl: 2 .  levothyroxine (SYNTHROID, LEVOTHROID) 100 MCG tablet, TAKE 1 TABLET(100 MCG) BY MOUTH DAILY BEFORE BREAKFAST, Disp: 30 tablet, Rfl: 0 .  megestrol (MEGACE ES) 625 MG/5ML suspension, Take 5 mLs (625 mg total) by mouth daily., Disp: 150 mL, Rfl: 2 .  meloxicam (MOBIC) 15 MG tablet, TAKE 1 TABLET(15 MG) BY MOUTH DAILY, Disp: 90 tablet, Rfl: 0 .  Multiple Vitamin (MULTIVITAMIN) tablet, Take 1 tablet by mouth daily., Disp: , Rfl:  .  protein supplement shake (PREMIER PROTEIN) LIQD, Take 325 mLs (11 oz total) by mouth  daily., Disp: 12 Can, Rfl: 3 .  metFORMIN (GLUCOPHAGE) 500 MG tablet, Take 2 tablets (1,000 mg total) by mouth 2 (two) times daily with a meal. (take as written in dc summary), Disp: 120 tablet, Rfl: 0 .  oxyCODONE-acetaminophen (PERCOCET) 10-325 MG tablet, Take 1 tablet by mouth every 12 (twelve) hours as needed for pain., Disp: 20 tablet, Rfl: 0  Objective: BP 104/60 (BP Location: Left Arm, Patient Position: Sitting, Cuff Size: Normal)   Pulse 88   Temp 97.7 F (36.5 C) (Oral)   Ht _0  (1.6 m)   Wt 83 lb (37.6 kg)   SpO2 95%   BMI 14.70 kg/m  General: Awake, appears stated age Lungs: No accessory muscle use Psych: Age appropriate judgment and insight, normal affect and mood  Assessment and Plan: Diabetes mellitus type 2 in nonobese ([place]) - Plan: Insulin Glargine (LANTUS) 100 UNIT/ML Solostar Pen  Chest wall pain - Plan: oxyCODONE-acetaminophen (PERCOCET) 10-325 MG tablet  Orders as above.  Keep mealtime insulin the same.  Increase nighttime insulin to 10 units nightly.  Increase by 1 unit every 3 days if morning sugars are consistently above 160.  We will look further into endocrinology referral where they are having trouble contacting her. Try oxycodone for chest wall pain in the setting of stage IV cancer. F/u in 1 week.  The patient voiced understanding and agreement to the plan.  [place], DO 06/19/17  12:34 PM

## 2017-06-22 ENCOUNTER — Other Ambulatory Visit: Payer: Self-pay | Admitting: Family Medicine

## 2017-06-25 ENCOUNTER — Telehealth: Payer: Self-pay | Admitting: Family Medicine

## 2017-06-25 ENCOUNTER — Telehealth: Payer: Self-pay | Admitting: *Deleted

## 2017-06-25 ENCOUNTER — Other Ambulatory Visit: Payer: Self-pay

## 2017-06-25 DIAGNOSIS — D7389 Other diseases of spleen: Secondary | ICD-10-CM | POA: Diagnosis not present

## 2017-06-25 DIAGNOSIS — Z7902 Long term (current) use of antithrombotics/antiplatelets: Secondary | ICD-10-CM | POA: Diagnosis not present

## 2017-06-25 DIAGNOSIS — I700 Atherosclerosis of aorta: Secondary | ICD-10-CM | POA: Diagnosis not present

## 2017-06-25 DIAGNOSIS — Z794 Long term (current) use of insulin: Secondary | ICD-10-CM | POA: Diagnosis not present

## 2017-06-25 DIAGNOSIS — E039 Hypothyroidism, unspecified: Secondary | ICD-10-CM | POA: Diagnosis not present

## 2017-06-25 DIAGNOSIS — Z48812 Encounter for surgical aftercare following surgery on the circulatory system: Secondary | ICD-10-CM | POA: Diagnosis not present

## 2017-06-25 DIAGNOSIS — L988 Other specified disorders of the skin and subcutaneous tissue: Secondary | ICD-10-CM | POA: Diagnosis not present

## 2017-06-25 DIAGNOSIS — E0865 Diabetes mellitus due to underlying condition with hyperglycemia: Secondary | ICD-10-CM | POA: Diagnosis not present

## 2017-06-25 DIAGNOSIS — J432 Centrilobular emphysema: Secondary | ICD-10-CM | POA: Diagnosis not present

## 2017-06-25 DIAGNOSIS — I10 Essential (primary) hypertension: Secondary | ICD-10-CM | POA: Diagnosis not present

## 2017-06-25 DIAGNOSIS — E0851 Diabetes mellitus due to underlying condition with diabetic peripheral angiopathy without gangrene: Secondary | ICD-10-CM | POA: Diagnosis not present

## 2017-06-25 DIAGNOSIS — E43 Unspecified severe protein-calorie malnutrition: Secondary | ICD-10-CM | POA: Diagnosis not present

## 2017-06-25 DIAGNOSIS — K219 Gastro-esophageal reflux disease without esophagitis: Secondary | ICD-10-CM | POA: Diagnosis not present

## 2017-06-25 DIAGNOSIS — E279 Disorder of adrenal gland, unspecified: Secondary | ICD-10-CM | POA: Diagnosis not present

## 2017-06-25 DIAGNOSIS — Z85118 Personal history of other malignant neoplasm of bronchus and lung: Secondary | ICD-10-CM | POA: Diagnosis not present

## 2017-06-25 DIAGNOSIS — R64 Cachexia: Secondary | ICD-10-CM | POA: Diagnosis not present

## 2017-06-25 DIAGNOSIS — D500 Iron deficiency anemia secondary to blood loss (chronic): Secondary | ICD-10-CM | POA: Diagnosis not present

## 2017-06-25 DIAGNOSIS — Z4801 Encounter for change or removal of surgical wound dressing: Secondary | ICD-10-CM | POA: Diagnosis not present

## 2017-06-25 DIAGNOSIS — R161 Splenomegaly, not elsewhere classified: Secondary | ICD-10-CM | POA: Diagnosis not present

## 2017-06-25 DIAGNOSIS — Z95820 Peripheral vascular angioplasty status with implants and grafts: Secondary | ICD-10-CM | POA: Diagnosis not present

## 2017-06-25 DIAGNOSIS — Z7982 Long term (current) use of aspirin: Secondary | ICD-10-CM | POA: Diagnosis not present

## 2017-06-25 DIAGNOSIS — R918 Other nonspecific abnormal finding of lung field: Secondary | ICD-10-CM | POA: Diagnosis not present

## 2017-06-25 DIAGNOSIS — R627 Adult failure to thrive: Secondary | ICD-10-CM | POA: Diagnosis not present

## 2017-06-25 DIAGNOSIS — I2510 Atherosclerotic heart disease of native coronary artery without angina pectoris: Secondary | ICD-10-CM | POA: Diagnosis not present

## 2017-06-25 NOTE — Telephone Encounter (Signed)
Checked with paperwork and no we have not received. Called Tillie Rung left a detailed message we have not received that paperwork and to resend to our fax.

## 2017-06-25 NOTE — Telephone Encounter (Deleted)
Received Physician Orders from [facility]; forwarded to provider/SLS 04/30

## 2017-06-25 NOTE — Telephone Encounter (Signed)
Received Physician Orders from [facility]; forwarded to provider/SLS 04/30

## 2017-06-25 NOTE — Telephone Encounter (Signed)
Copied from [place] [phone]. Topic: Quick Communication - See Telephone Encounter >> Jun 25, 2017 10:45 AM Rutherford Nail, NT wrote: CRM for notification. See Telephone encounter for: 06/25/17. Tillie Rung with [place] calling to check the status of a fax she sent over in regards to [facility] application for in home aide. Please advise. CB#: [phone]

## 2017-06-25 NOTE — Patient Outreach (Signed)
[facility]) Care Management  06/25/2017  Brittany Mercado Jul 18, 1950 [id number]   BSW contacted Dr. Irene Limbo office to follow up on the status of patients PCS application. Application not completed at this time.   Plan: BSW to follow up on status in the next week.  Daneen Schick, BSW, CDP Social Worker Cell # [phone] [email or web address]

## 2017-06-26 ENCOUNTER — Other Ambulatory Visit: Payer: Self-pay | Admitting: Family Medicine

## 2017-06-26 ENCOUNTER — Telehealth: Payer: Self-pay | Admitting: *Deleted

## 2017-06-26 DIAGNOSIS — E119 Type 2 diabetes mellitus without complications: Secondary | ICD-10-CM

## 2017-06-26 NOTE — Telephone Encounter (Signed)
Received Home Health Certification and Plan of Care; forwarded to provider/SLS 05/01

## 2017-06-27 ENCOUNTER — Other Ambulatory Visit: Payer: Self-pay

## 2017-06-27 ENCOUNTER — Telehealth: Payer: Self-pay | Admitting: *Deleted

## 2017-06-27 NOTE — Patient Outreach (Signed)
[place] [facility] [facility]) Care Management  06/27/2017  Celie Desrochers [birth year] [id number]   BSW recevied a phone call from Rod Holler with Dr. Irene Limbo office confirming PCS application was recevied via fax.  Daneen Schick, BSW, CDP Social Worker Cell # [phone] [email or web address]

## 2017-06-27 NOTE — Patient Outreach (Signed)
[place] [facility]) Care Management  06/27/2017  Codie Hainer 11/29/1950 [id number]   BSW received voicemail from Ivin Booty, Dr. Serita Sheller nurse on 06/25/17 indicating PCS application was never received. BSW re-faxed form with completion request to Dr. Nani Ravens attention Ivin Booty on 06/26/17. BSW received a fax confirmation that transmission was successful.  BSW contacted Dr. Serita Sheller office on today's date. Ivin Booty unavailable. BSW spoke with Rod Holler. Rod Holler indicated fax was not received. BSW verified fax number in order to send application on today's date. BSW obtained a separate fax than originally given [phone]).   BSW received a confirmation that fax transmission was successful.  Plan: BSW to follow up within the next week on completion of application for PCS services.

## 2017-06-28 ENCOUNTER — Other Ambulatory Visit: Payer: Self-pay

## 2017-06-28 ENCOUNTER — Other Ambulatory Visit: Payer: Self-pay | Admitting: Family Medicine

## 2017-06-28 DIAGNOSIS — R0789 Other chest pain: Secondary | ICD-10-CM

## 2017-06-28 MED ORDER — OXYCODONE-ACETAMINOPHEN 10-325 MG PO TABS: 1.0000 | ORAL_TABLET | Freq: Two times a day (BID) | ORAL | 0 refills | Status: DC | PRN

## 2017-06-28 NOTE — Telephone Encounter (Signed)
Received, completed and faxed on 06/27/17/SLS

## 2017-06-28 NOTE — Patient Outreach (Signed)
[place] [facility]) Care Management  06/28/2017  Levon Penning 1950/12/10 [id number]   BSW received completed PCS application from Dr. Nani Ravens. BSW faxed completed application to [facility].  Daneen Schick, BSW, CDP Social Worker Cell # [phone] [email or web address]

## 2017-06-28 NOTE — Telephone Encounter (Signed)
Requesting:oxycodone-acetaminophen Contract:05/07/17 FVC:[facility] Last Visit:06/19/17 Next Visit:09/11/17 Last Refill:06/19/17 No discrepancies   Please Advise

## 2017-06-28 NOTE — Telephone Encounter (Signed)
LOV  06/19/17 Dr. Nani Ravens  Last refill 06/19/17  # 20  0 refill

## 2017-06-28 NOTE — Telephone Encounter (Signed)
Copied from [place] [id number]. Topic: Quick Communication - Rx Refill/Question >> Jun 28, 2017  9:44 AM Neva Seat wrote: oxyCODONE-acetaminophen (PERCOCET) 10-325 MG tablet  Refill   [facility] [phone] - [place], [place] - [address] [place] [address] Phone: [phone] Fax: [phone]

## 2017-07-01 ENCOUNTER — Ambulatory Visit: Payer: Medicare Other | Admitting: Internal Medicine

## 2017-07-02 ENCOUNTER — Other Ambulatory Visit: Payer: Self-pay

## 2017-07-02 DIAGNOSIS — E0851 Diabetes mellitus due to underlying condition with diabetic peripheral angiopathy without gangrene: Secondary | ICD-10-CM | POA: Diagnosis not present

## 2017-07-02 DIAGNOSIS — R161 Splenomegaly, not elsewhere classified: Secondary | ICD-10-CM | POA: Diagnosis not present

## 2017-07-02 DIAGNOSIS — I2510 Atherosclerotic heart disease of native coronary artery without angina pectoris: Secondary | ICD-10-CM | POA: Diagnosis not present

## 2017-07-02 DIAGNOSIS — I700 Atherosclerosis of aorta: Secondary | ICD-10-CM | POA: Diagnosis not present

## 2017-07-02 DIAGNOSIS — Z4801 Encounter for change or removal of surgical wound dressing: Secondary | ICD-10-CM | POA: Diagnosis not present

## 2017-07-02 DIAGNOSIS — R627 Adult failure to thrive: Secondary | ICD-10-CM | POA: Diagnosis not present

## 2017-07-02 DIAGNOSIS — Z48812 Encounter for surgical aftercare following surgery on the circulatory system: Secondary | ICD-10-CM | POA: Diagnosis not present

## 2017-07-02 DIAGNOSIS — E279 Disorder of adrenal gland, unspecified: Secondary | ICD-10-CM | POA: Diagnosis not present

## 2017-07-02 DIAGNOSIS — Z794 Long term (current) use of insulin: Secondary | ICD-10-CM | POA: Diagnosis not present

## 2017-07-02 DIAGNOSIS — K219 Gastro-esophageal reflux disease without esophagitis: Secondary | ICD-10-CM | POA: Diagnosis not present

## 2017-07-02 DIAGNOSIS — R64 Cachexia: Secondary | ICD-10-CM | POA: Diagnosis not present

## 2017-07-02 DIAGNOSIS — Z95820 Peripheral vascular angioplasty status with implants and grafts: Secondary | ICD-10-CM | POA: Diagnosis not present

## 2017-07-02 DIAGNOSIS — Z7982 Long term (current) use of aspirin: Secondary | ICD-10-CM | POA: Diagnosis not present

## 2017-07-02 DIAGNOSIS — E0865 Diabetes mellitus due to underlying condition with hyperglycemia: Secondary | ICD-10-CM | POA: Diagnosis not present

## 2017-07-02 DIAGNOSIS — L988 Other specified disorders of the skin and subcutaneous tissue: Secondary | ICD-10-CM | POA: Diagnosis not present

## 2017-07-02 DIAGNOSIS — J432 Centrilobular emphysema: Secondary | ICD-10-CM | POA: Diagnosis not present

## 2017-07-02 DIAGNOSIS — Z7902 Long term (current) use of antithrombotics/antiplatelets: Secondary | ICD-10-CM | POA: Diagnosis not present

## 2017-07-02 DIAGNOSIS — D7389 Other diseases of spleen: Secondary | ICD-10-CM | POA: Diagnosis not present

## 2017-07-02 DIAGNOSIS — I10 Essential (primary) hypertension: Secondary | ICD-10-CM | POA: Diagnosis not present

## 2017-07-02 DIAGNOSIS — Z85118 Personal history of other malignant neoplasm of bronchus and lung: Secondary | ICD-10-CM | POA: Diagnosis not present

## 2017-07-02 DIAGNOSIS — D500 Iron deficiency anemia secondary to blood loss (chronic): Secondary | ICD-10-CM | POA: Diagnosis not present

## 2017-07-02 DIAGNOSIS — R918 Other nonspecific abnormal finding of lung field: Secondary | ICD-10-CM | POA: Diagnosis not present

## 2017-07-02 DIAGNOSIS — E039 Hypothyroidism, unspecified: Secondary | ICD-10-CM | POA: Diagnosis not present

## 2017-07-02 DIAGNOSIS — E43 Unspecified severe protein-calorie malnutrition: Secondary | ICD-10-CM | POA: Diagnosis not present

## 2017-07-02 NOTE — Patient Outreach (Signed)
[place] [facility]) Care Management  07/02/2017  Brittany Mercado January 30, 1951 [id number]  10:30am: BSW contacted [place] to follow-up on the status of patients PCS application. BSW spoke to [place], Radiation protection practitioner. Jermaine informed BSW that although PCS application was received, the form has been updated and [employer] needs the information filled out on the updated form.   BSW obtained updated form to fax to Dr. Nani Ravens. BSW called and spoke with Angie at Dr. Irene Limbo office notifying of need to re-do PCS application.   10:45 am: BSW called patient's son Brittany Mercado to check on patient's status within the home. HIPAA identifiers verified. [name] stated patient "doing okay". Patient is scheduled to have a home visit through home health today for therapy. [name] is still in need of assistance with in home aide. BSW spoke with [name] about requesting a home health aide while patient is under the care of home health. BSW will continue to work on obtaining long term PCS services through [employer]. [name] in agreement with this.  Plan: BSW to fax Dr. Nani Ravens PCS form to be completed.  Daneen Schick, BSW, CDP Social Worker Cell # [phone] [email or web address] [email or web address]

## 2017-07-03 ENCOUNTER — Telehealth: Payer: Self-pay | Admitting: *Deleted

## 2017-07-03 NOTE — Telephone Encounter (Signed)
I have not see any fax for [place] that I have yet to fill out.

## 2017-07-06 ENCOUNTER — Other Ambulatory Visit: Payer: Self-pay

## 2017-07-06 ENCOUNTER — Emergency Department (HOSPITAL_BASED_OUTPATIENT_CLINIC_OR_DEPARTMENT_OTHER): Payer: Medicare Other

## 2017-07-06 ENCOUNTER — Inpatient Hospital Stay (HOSPITAL_BASED_OUTPATIENT_CLINIC_OR_DEPARTMENT_OTHER): Payer: Medicare Other

## 2017-07-06 ENCOUNTER — Inpatient Hospital Stay (HOSPITAL_BASED_OUTPATIENT_CLINIC_OR_DEPARTMENT_OTHER)
Admission: EM | Admit: 2017-07-06 | Discharge: 2017-07-18 | DRG: 981 | Disposition: A | Discharge status: Skilled Nursing Facility | Payer: Medicare Other | Attending: Internal Medicine | Admitting: Internal Medicine

## 2017-07-06 ENCOUNTER — Encounter (HOSPITAL_BASED_OUTPATIENT_CLINIC_OR_DEPARTMENT_OTHER): Payer: Self-pay | Admitting: Adult Health

## 2017-07-06 DIAGNOSIS — D500 Iron deficiency anemia secondary to blood loss (chronic): Secondary | ICD-10-CM | POA: Diagnosis not present

## 2017-07-06 DIAGNOSIS — K7689 Other specified diseases of liver: Secondary | ICD-10-CM | POA: Diagnosis not present

## 2017-07-06 DIAGNOSIS — Z794 Long term (current) use of insulin: Secondary | ICD-10-CM

## 2017-07-06 DIAGNOSIS — I313 Pericardial effusion (noninflammatory): Secondary | ICD-10-CM | POA: Diagnosis not present

## 2017-07-06 DIAGNOSIS — Z90710 Acquired absence of both cervix and uterus: Secondary | ICD-10-CM

## 2017-07-06 DIAGNOSIS — N182 Chronic kidney disease, stage 2 (mild): Secondary | ICD-10-CM | POA: Diagnosis present

## 2017-07-06 DIAGNOSIS — Z79890 Hormone replacement therapy (postmenopausal): Secondary | ICD-10-CM

## 2017-07-06 DIAGNOSIS — I248 Other forms of acute ischemic heart disease: Secondary | ICD-10-CM | POA: Diagnosis present

## 2017-07-06 DIAGNOSIS — M549 Dorsalgia, unspecified: Secondary | ICD-10-CM | POA: Diagnosis not present

## 2017-07-06 DIAGNOSIS — E43 Unspecified severe protein-calorie malnutrition: Secondary | ICD-10-CM | POA: Diagnosis not present

## 2017-07-06 DIAGNOSIS — C3490 Malignant neoplasm of unspecified part of unspecified bronchus or lung: Secondary | ICD-10-CM | POA: Diagnosis not present

## 2017-07-06 DIAGNOSIS — Z923 Personal history of irradiation: Secondary | ICD-10-CM | POA: Diagnosis not present

## 2017-07-06 DIAGNOSIS — Z4682 Encounter for fitting and adjustment of non-vascular catheter: Secondary | ICD-10-CM | POA: Diagnosis not present

## 2017-07-06 DIAGNOSIS — J910 Malignant pleural effusion: Secondary | ICD-10-CM | POA: Diagnosis present

## 2017-07-06 DIAGNOSIS — R0789 Other chest pain: Secondary | ICD-10-CM

## 2017-07-06 DIAGNOSIS — I340 Nonrheumatic mitral (valve) insufficiency: Secondary | ICD-10-CM | POA: Diagnosis not present

## 2017-07-06 DIAGNOSIS — N2889 Other specified disorders of kidney and ureter: Secondary | ICD-10-CM | POA: Diagnosis not present

## 2017-07-06 DIAGNOSIS — Z791 Long term (current) use of non-steroidal anti-inflammatories (NSAID): Secondary | ICD-10-CM

## 2017-07-06 DIAGNOSIS — J440 Chronic obstructive pulmonary disease with acute lower respiratory infection: Secondary | ICD-10-CM | POA: Diagnosis not present

## 2017-07-06 DIAGNOSIS — Z7982 Long term (current) use of aspirin: Secondary | ICD-10-CM

## 2017-07-06 DIAGNOSIS — C801 Malignant (primary) neoplasm, unspecified: Secondary | ICD-10-CM | POA: Diagnosis not present

## 2017-07-06 DIAGNOSIS — I319 Disease of pericardium, unspecified: Secondary | ICD-10-CM | POA: Diagnosis not present

## 2017-07-06 DIAGNOSIS — R0602 Shortness of breath: Secondary | ICD-10-CM | POA: Diagnosis not present

## 2017-07-06 DIAGNOSIS — Z87891 Personal history of nicotine dependence: Secondary | ICD-10-CM | POA: Diagnosis not present

## 2017-07-06 DIAGNOSIS — E875 Hyperkalemia: Secondary | ICD-10-CM | POA: Diagnosis present

## 2017-07-06 DIAGNOSIS — I9581 Postprocedural hypotension: Secondary | ICD-10-CM | POA: Diagnosis not present

## 2017-07-06 DIAGNOSIS — E11649 Type 2 diabetes mellitus with hypoglycemia without coma: Secondary | ICD-10-CM | POA: Diagnosis not present

## 2017-07-06 DIAGNOSIS — J189 Pneumonia, unspecified organism: Secondary | ICD-10-CM | POA: Diagnosis present

## 2017-07-06 DIAGNOSIS — D509 Iron deficiency anemia, unspecified: Secondary | ICD-10-CM | POA: Diagnosis present

## 2017-07-06 DIAGNOSIS — E039 Hypothyroidism, unspecified: Secondary | ICD-10-CM | POA: Diagnosis present

## 2017-07-06 DIAGNOSIS — K219 Gastro-esophageal reflux disease without esophagitis: Secondary | ICD-10-CM | POA: Diagnosis not present

## 2017-07-06 DIAGNOSIS — I3139 Other pericardial effusion (noninflammatory): Secondary | ICD-10-CM

## 2017-07-06 DIAGNOSIS — Z681 Body mass index (BMI) 19 or less, adult: Secondary | ICD-10-CM | POA: Diagnosis not present

## 2017-07-06 DIAGNOSIS — J9811 Atelectasis: Secondary | ICD-10-CM | POA: Diagnosis not present

## 2017-07-06 DIAGNOSIS — E876 Hypokalemia: Secondary | ICD-10-CM | POA: Diagnosis present

## 2017-07-06 DIAGNOSIS — J918 Pleural effusion in other conditions classified elsewhere: Secondary | ICD-10-CM | POA: Diagnosis not present

## 2017-07-06 DIAGNOSIS — C7971 Secondary malignant neoplasm of right adrenal gland: Secondary | ICD-10-CM | POA: Diagnosis not present

## 2017-07-06 DIAGNOSIS — R7989 Other specified abnormal findings of blood chemistry: Secondary | ICD-10-CM

## 2017-07-06 DIAGNOSIS — I129 Hypertensive chronic kidney disease with stage 1 through stage 4 chronic kidney disease, or unspecified chronic kidney disease: Secondary | ICD-10-CM | POA: Diagnosis present

## 2017-07-06 DIAGNOSIS — R945 Abnormal results of liver function studies: Secondary | ICD-10-CM | POA: Diagnosis not present

## 2017-07-06 DIAGNOSIS — G8929 Other chronic pain: Secondary | ICD-10-CM | POA: Diagnosis present

## 2017-07-06 DIAGNOSIS — N289 Disorder of kidney and ureter, unspecified: Secondary | ICD-10-CM | POA: Diagnosis not present

## 2017-07-06 DIAGNOSIS — E1110 Type 2 diabetes mellitus with ketoacidosis without coma: Secondary | ICD-10-CM | POA: Diagnosis not present

## 2017-07-06 DIAGNOSIS — C3431 Malignant neoplasm of lower lobe, right bronchus or lung: Secondary | ICD-10-CM | POA: Diagnosis not present

## 2017-07-06 DIAGNOSIS — I361 Nonrheumatic tricuspid (valve) insufficiency: Secondary | ICD-10-CM | POA: Diagnosis not present

## 2017-07-06 DIAGNOSIS — I318 Other specified diseases of pericardium: Secondary | ICD-10-CM | POA: Diagnosis not present

## 2017-07-06 DIAGNOSIS — D638 Anemia in other chronic diseases classified elsewhere: Secondary | ICD-10-CM | POA: Diagnosis not present

## 2017-07-06 DIAGNOSIS — J9601 Acute respiratory failure with hypoxia: Secondary | ICD-10-CM | POA: Diagnosis present

## 2017-07-06 DIAGNOSIS — R000 Tachycardia, unspecified: Secondary | ICD-10-CM | POA: Diagnosis present

## 2017-07-06 DIAGNOSIS — E872 Acidosis: Secondary | ICD-10-CM | POA: Diagnosis present

## 2017-07-06 DIAGNOSIS — Z7401 Bed confinement status: Secondary | ICD-10-CM | POA: Diagnosis not present

## 2017-07-06 DIAGNOSIS — Z880 Allergy status to penicillin: Secondary | ICD-10-CM | POA: Diagnosis not present

## 2017-07-06 DIAGNOSIS — E1151 Type 2 diabetes mellitus with diabetic peripheral angiopathy without gangrene: Secondary | ICD-10-CM | POA: Diagnosis not present

## 2017-07-06 DIAGNOSIS — J181 Lobar pneumonia, unspecified organism: Secondary | ICD-10-CM | POA: Diagnosis not present

## 2017-07-06 DIAGNOSIS — Z79891 Long term (current) use of opiate analgesic: Secondary | ICD-10-CM

## 2017-07-06 DIAGNOSIS — I351 Nonrheumatic aortic (valve) insufficiency: Secondary | ICD-10-CM | POA: Diagnosis not present

## 2017-07-06 DIAGNOSIS — E0810 Diabetes mellitus due to underlying condition with ketoacidosis without coma: Secondary | ICD-10-CM | POA: Diagnosis not present

## 2017-07-06 DIAGNOSIS — M6281 Muscle weakness (generalized): Secondary | ICD-10-CM | POA: Diagnosis not present

## 2017-07-06 DIAGNOSIS — E119 Type 2 diabetes mellitus without complications: Secondary | ICD-10-CM | POA: Diagnosis not present

## 2017-07-06 DIAGNOSIS — E860 Dehydration: Secondary | ICD-10-CM | POA: Diagnosis present

## 2017-07-06 DIAGNOSIS — R570 Cardiogenic shock: Secondary | ICD-10-CM | POA: Diagnosis not present

## 2017-07-06 DIAGNOSIS — E1122 Type 2 diabetes mellitus with diabetic chronic kidney disease: Secondary | ICD-10-CM | POA: Diagnosis present

## 2017-07-06 DIAGNOSIS — E1165 Type 2 diabetes mellitus with hyperglycemia: Secondary | ICD-10-CM | POA: Diagnosis not present

## 2017-07-06 DIAGNOSIS — R918 Other nonspecific abnormal finding of lung field: Secondary | ICD-10-CM | POA: Diagnosis not present

## 2017-07-06 DIAGNOSIS — J449 Chronic obstructive pulmonary disease, unspecified: Secondary | ICD-10-CM | POA: Diagnosis not present

## 2017-07-06 DIAGNOSIS — K7200 Acute and subacute hepatic failure without coma: Secondary | ICD-10-CM | POA: Diagnosis not present

## 2017-07-06 DIAGNOSIS — R1312 Dysphagia, oropharyngeal phase: Secondary | ICD-10-CM | POA: Diagnosis not present

## 2017-07-06 DIAGNOSIS — R740 Nonspecific elevation of levels of transaminase and lactic acid dehydrogenase [LDH]: Secondary | ICD-10-CM | POA: Diagnosis not present

## 2017-07-06 DIAGNOSIS — K5900 Constipation, unspecified: Secondary | ICD-10-CM | POA: Diagnosis not present

## 2017-07-06 DIAGNOSIS — Z9689 Presence of other specified functional implants: Secondary | ICD-10-CM

## 2017-07-06 DIAGNOSIS — C380 Malignant neoplasm of heart: Secondary | ICD-10-CM | POA: Diagnosis not present

## 2017-07-06 DIAGNOSIS — Z79899 Other long term (current) drug therapy: Secondary | ICD-10-CM

## 2017-07-06 DIAGNOSIS — R627 Adult failure to thrive: Secondary | ICD-10-CM | POA: Diagnosis present

## 2017-07-06 DIAGNOSIS — I3131 Malignant pericardial effusion in diseases classified elsewhere: Secondary | ICD-10-CM | POA: Diagnosis present

## 2017-07-06 DIAGNOSIS — D649 Anemia, unspecified: Secondary | ICD-10-CM | POA: Diagnosis present

## 2017-07-06 DIAGNOSIS — Z7902 Long term (current) use of antithrombotics/antiplatelets: Secondary | ICD-10-CM

## 2017-07-06 DIAGNOSIS — J9602 Acute respiratory failure with hypercapnia: Secondary | ICD-10-CM | POA: Diagnosis not present

## 2017-07-06 DIAGNOSIS — M2550 Pain in unspecified joint: Secondary | ICD-10-CM | POA: Diagnosis not present

## 2017-07-06 DIAGNOSIS — E1311 Other specified diabetes mellitus with ketoacidosis with coma: Secondary | ICD-10-CM | POA: Diagnosis not present

## 2017-07-06 DIAGNOSIS — R079 Chest pain, unspecified: Secondary | ICD-10-CM | POA: Diagnosis not present

## 2017-07-06 DIAGNOSIS — C3491 Malignant neoplasm of unspecified part of right bronchus or lung: Secondary | ICD-10-CM | POA: Diagnosis not present

## 2017-07-06 DIAGNOSIS — Z9221 Personal history of antineoplastic chemotherapy: Secondary | ICD-10-CM

## 2017-07-06 DIAGNOSIS — N179 Acute kidney failure, unspecified: Secondary | ICD-10-CM | POA: Diagnosis present

## 2017-07-06 DIAGNOSIS — I10 Essential (primary) hypertension: Secondary | ICD-10-CM | POA: Diagnosis not present

## 2017-07-06 DIAGNOSIS — R531 Weakness: Secondary | ICD-10-CM | POA: Diagnosis not present

## 2017-07-06 DIAGNOSIS — Z9889 Other specified postprocedural states: Secondary | ICD-10-CM

## 2017-07-06 DIAGNOSIS — R262 Difficulty in walking, not elsewhere classified: Secondary | ICD-10-CM | POA: Diagnosis not present

## 2017-07-06 DIAGNOSIS — Z86711 Personal history of pulmonary embolism: Secondary | ICD-10-CM | POA: Diagnosis not present

## 2017-07-06 LAB — COMPREHENSIVE METABOLIC PANEL
ALBUMIN: 3.1 g/dL — AB (ref 3.5–5.0)
ALK PHOS: 529 U/L — AB (ref 38–126)
ALT: 283 U/L — AB (ref 14–54)
AST: 387 U/L — AB (ref 15–41)
Anion gap: >20 — ABNORMAL HIGH (ref 5–15)
BUN: 21 mg/dL — ABNORMAL HIGH (ref 6–20)
CALCIUM: 8.6 mg/dL — AB (ref 8.9–10.3)
CHLORIDE: 101 mmol/L (ref 101–111)
CO2: 8 mmol/L — AB (ref 22–32)
CREATININE: 1.52 mg/dL — AB (ref 0.44–1.00)
GFR calc non Af Amer: 35 mL/min — ABNORMAL LOW (ref >60)
GFR, EST AFRICAN AMERICAN: 40 mL/min — AB (ref >60)
GLUCOSE: 525 mg/dL — AB (ref 65–99)
Potassium: 5.3 mmol/L — ABNORMAL HIGH (ref 3.5–5.1)
SODIUM: 137 mmol/L (ref 135–145)
Total Bilirubin: 1.8 mg/dL — ABNORMAL HIGH (ref 0.3–1.2)
Total Protein: 7.7 g/dL (ref 6.5–8.1)

## 2017-07-06 LAB — I-STAT ARTERIAL BLOOD GAS, ED
Acid-base deficit: 20 mmol/L — ABNORMAL HIGH (ref 0.0–2.0)
Bicarbonate: 6.1 mmol/L — ABNORMAL LOW (ref 20.0–28.0)
O2 SAT: 95 %
PH ART: 7.184 — AB (ref 7.350–7.450)
PO2 ART: 92 mmHg (ref 83.0–108.0)
Patient temperature: 97.9
TCO2: 7 mmol/L — ABNORMAL LOW (ref 22–32)
pCO2 arterial: 16.1 mmHg — CL (ref 32.0–48.0)

## 2017-07-06 LAB — URINALYSIS, ROUTINE W REFLEX MICROSCOPIC
Leukocytes, UA: NEGATIVE
NITRITE: NEGATIVE
PH: 5.5 (ref 5.0–8.0)
Protein, ur: NEGATIVE mg/dL
Specific Gravity, Urine: 1.025 (ref 1.005–1.030)

## 2017-07-06 LAB — BASIC METABOLIC PANEL
BUN: 22 mg/dL — AB (ref 6–20)
CO2: <7 mmol/L — ABNORMAL LOW (ref 22–32)
Calcium: 7.7 mg/dL — ABNORMAL LOW (ref 8.9–10.3)
Chloride: 107 mmol/L (ref 101–111)
Creatinine, Ser: 1.51 mg/dL — ABNORMAL HIGH (ref 0.44–1.00)
GFR calc Af Amer: 41 mL/min — ABNORMAL LOW (ref >60)
GFR, EST NON AFRICAN AMERICAN: 35 mL/min — AB (ref >60)
Glucose, Bld: 543 mg/dL (ref 65–99)
POTASSIUM: 5.7 mmol/L — AB (ref 3.5–5.1)
Sodium: 136 mmol/L (ref 135–145)

## 2017-07-06 LAB — CBC WITH DIFFERENTIAL/PLATELET
BASOS PCT: 0 %
Basophils Absolute: 0 10*3/uL (ref 0.0–0.1)
Eosinophils Absolute: 0 10*3/uL (ref 0.0–0.7)
Eosinophils Relative: 0 %
HCT: 26 % — ABNORMAL LOW (ref 36.0–46.0)
HEMOGLOBIN: 9.5 g/dL — AB (ref 12.0–15.0)
Lymphocytes Relative: 15 %
Lymphs Abs: 2 10*3/uL (ref 0.7–4.0)
MCH: 29.2 pg (ref 26.0–34.0)
MCHC: 36.5 g/dL — AB (ref 30.0–36.0)
MCV: 80 fL (ref 78.0–100.0)
MONO ABS: 1.3 10*3/uL — AB (ref 0.1–1.0)
Monocytes Relative: 10 %
NEUTROS ABS: 9.9 10*3/uL — AB (ref 1.7–7.7)
NEUTROS PCT: 75 %
Platelets: 319 10*3/uL (ref 150–400)
RBC: 3.25 MIL/uL — ABNORMAL LOW (ref 3.87–5.11)
RDW: 15 % (ref 11.5–15.5)
WBC: 13.2 10*3/uL — ABNORMAL HIGH (ref 4.0–10.5)

## 2017-07-06 LAB — URINALYSIS, MICROSCOPIC (REFLEX)

## 2017-07-06 LAB — GLUCOSE, CAPILLARY
GLUCOSE-CAPILLARY: 152 mg/dL — AB (ref 65–99)
Glucose-Capillary: 340 mg/dL — ABNORMAL HIGH (ref 65–99)
Glucose-Capillary: 229 mg/dL — ABNORMAL HIGH (ref 65–99)
Glucose-Capillary: 173 mg/dL — ABNORMAL HIGH (ref 65–99)

## 2017-07-06 LAB — CBG MONITORING, ED
GLUCOSE-CAPILLARY: 495 mg/dL — AB (ref 65–99)
GLUCOSE-CAPILLARY: 411 mg/dL — AB (ref 65–99)
Glucose-Capillary: 415 mg/dL — ABNORMAL HIGH (ref 65–99)
Glucose-Capillary: 426 mg/dL — ABNORMAL HIGH (ref 65–99)
Glucose-Capillary: 467 mg/dL — ABNORMAL HIGH (ref 65–99)
Glucose-Capillary: 447 mg/dL — ABNORMAL HIGH (ref 65–99)
Glucose-Capillary: 476 mg/dL — ABNORMAL HIGH (ref 65–99)

## 2017-07-06 LAB — TROPONIN I: Troponin I: <0.03 ng/mL (ref <0.03)

## 2017-07-06 MED ORDER — INSULIN ASPART 100 UNIT/ML IV SOLN
10.0000 [IU] | Freq: Once | INTRAVENOUS | Status: AC
Start: 2017-07-06 — End: 2017-07-06
  Administered 2017-07-06: 10 [IU] via INTRAVENOUS
  Filled 2017-07-06: qty 1

## 2017-07-06 MED ORDER — SODIUM CHLORIDE 0.9 % IV SOLN
INTRAVENOUS | Status: DC
  Administered 2017-07-06: 21:00:00 via INTRAVENOUS

## 2017-07-06 MED ORDER — ORAL CARE MOUTH RINSE
15.0000 mL | Freq: Two times a day (BID) | OROMUCOSAL | Status: DC
  Administered 2017-07-07 – 2017-07-08 (×2): 15 mL via OROMUCOSAL

## 2017-07-06 MED ORDER — SODIUM CHLORIDE 0.9 % IV SOLN
INTRAVENOUS | Status: DC
  Administered 2017-07-06: 3.5 [IU]/h via INTRAVENOUS
  Administered 2017-07-06: 14 [IU]/h via INTRAVENOUS
  Filled 2017-07-06 (×3): qty 1

## 2017-07-06 MED ORDER — SODIUM BICARBONATE 8.4 % IV SOLN
INTRAVENOUS | Status: AC
  Filled 2017-07-06: qty 50

## 2017-07-06 MED ORDER — SODIUM CHLORIDE 0.9 % IV BOLUS
1000.0000 mL | Freq: Once | INTRAVENOUS | Status: AC
Start: 2017-07-06 — End: 2017-07-06
  Administered 2017-07-06: 1000 mL via INTRAVENOUS

## 2017-07-06 MED ORDER — SODIUM BICARBONATE 8.4 % IV SOLN
50.0000 meq | Freq: Once | INTRAVENOUS | Status: AC
  Administered 2017-07-06: 50 meq via INTRAVENOUS

## 2017-07-06 MED ORDER — ONDANSETRON HCL 4 MG/2ML IJ SOLN
4.0000 mg | Freq: Once | INTRAMUSCULAR | Status: AC
  Administered 2017-07-06: 4 mg via INTRAVENOUS
  Filled 2017-07-06: qty 2

## 2017-07-06 MED ORDER — SODIUM BICARBONATE 8.4 % IV SOLN: 50.0000 meq | Freq: Once | INTRAVENOUS | Status: DC

## 2017-07-06 MED ORDER — SODIUM CHLORIDE 0.9 % IV BOLUS
1000.0000 mL | Freq: Once | INTRAVENOUS | Status: AC
  Administered 2017-07-06: 1000 mL via INTRAVENOUS

## 2017-07-06 MED ORDER — SODIUM BICARBONATE 8.4 % IV SOLN
INTRAVENOUS | Status: AC
  Administered 2017-07-06: 50 meq via INTRAVENOUS
  Filled 2017-07-06: qty 50

## 2017-07-06 MED ORDER — DEXTROSE-NACL 5-0.45 % IV SOLN
INTRAVENOUS | Status: DC
  Administered 2017-07-06: 22:00:00 via INTRAVENOUS

## 2017-07-06 NOTE — Progress Notes (Signed)
    Called for admission at [facility] for DKA treatment. Patient currently undergoing tx for lung cancer under care of Dr. [name]. Per family, patient's blood sugars have been difficult to control at home, has not missed any insulin doses, no signs/symptoms of infectious etiology. Labs reveal metabolic acidosis with anion gap > 20. Blood glucose 525. She has received IVF and will start glucostabilizer at [facility] prior to transfer. Accepted to stepdown unit.   RN please call [place] flow manager at [phone] when patient arrives to unit for admission orders.   Dessa Phi, DO [facility] [email or web address] Password [email or web address] 07/06/2017, 3:12 PM

## 2017-07-06 NOTE — ED Notes (Signed)
Glucose 525, given to ED PA and primary RN

## 2017-07-06 NOTE — ED Notes (Signed)
Due to difficult access, Dr. Darl Householder approved only obtaining 1 set of blood cultures.

## 2017-07-06 NOTE — ED Triage Notes (Signed)
PT presents with severe chest pain and feeling her heart racing that has been ongoing for a while but became worse this AM while she was sleeping, the pain was so severe it woke her up. Pt is holding her right upper shoulder and chest stating the pain is severe there. She has stage 4 lung cancer is receiving chemotherapy. PT is cachetic appearing and tachypneic with RR of 56.

## 2017-07-06 NOTE — ED Notes (Signed)
Left Radial Artery x 1 attempt for lab draws. No complications noted. Bleeding stopped.

## 2017-07-06 NOTE — ED Notes (Signed)
Date and time results received: 07/06/17 1803   Test: glucose Critical Value: 543  Name of Provider Notified: J. Ward PA Orders Received? Or Actions Taken?: no orders given

## 2017-07-06 NOTE — ED Provider Notes (Addendum)
[facility] Provider Note   CSN: [id number] Arrival date & time: 07/06/17  1201     History   Chief Complaint Chief Complaint  Patient presents with  . Chest Pain    HPI Brittany Mercado is a 67 y.o. female.  The history is provided by the patient, a relative and medical records. No language interpreter was used.  Chest Pain   Associated symptoms include nausea and shortness of breath. Pertinent negatives include no abdominal pain, no fever and no vomiting.   Brittany Mercado is a 67 y.o. female  with a PMH of stage 4 lung cancer followed by oncology, DM2 who presents to the [facility] with family complaining of shortness of breath and nausea. Patient reports not feeling well for the last 2-3 days. This morning, family noted that she was breathing heavily and looked much worse. No known fever or chills. Feels nauseous, but no vomiting.  No leg pain or swelling.  Denies cough/congestion. No known sick contacts. She is actively getting treatment for her lung cancer. She receives Keytruda infusions ever 2 weeks, most recently on 4/22. No medications taken prior to arrival for symptoms. Family note that she has had trouble managing her blood sugars recently. Family report checking sugars during the day and range can be anywhere from 50's-450 throughout the day.   Past Medical History:  Diagnosis Date  . Adenocarcinoma of right lung, stage 4 ([place]) 11/30/2015  . Adult failure to thrive   . Complication of anesthesia    hard to wake up  . Diabetes mellitus type 2 in nonobese (HCC)   . Eczema   . Encounter for antineoplastic chemotherapy 11/30/2015  . Heart murmur    told at age 89 but never has had any problems  . History of radiation therapy 01/26/16-02/07/16   right pelvis 30 Gy in 10 fractions  . HTN (hypertension) 02/01/2016  . 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 Iron deficiency anemia due to chronic blood loss 12/10/2016    Patient Active  Problem List   Diagnosis Date Noted  . DKA, type 2 ([place]) 05/24/2017  . Toe ulcer ([place]) 05/24/2017  . GERD (gastroesophageal reflux disease) 05/23/2017  . Hypothyroidism 05/23/2017  . Pressure injury of skin 05/09/2017  . Hypokalemia 05/09/2017  . SIRS (systemic inflammatory response syndrome) ([place]) 05/09/2017  . Lactic acidosis 05/09/2017  . DKA (diabetic ketoacidoses) ([place]) 05/08/2017  . AKI (acute kidney injury) ([place]) 05/08/2017  . Adult failure to thrive 04/29/2017  . Severe protein-calorie malnutrition ([place]) 04/10/2017  . Acute encephalopathy 04/09/2017  . Community acquired pneumonia 04/09/2017  . Encephalopathy acute 04/08/2017  . Diabetes mellitus type 2 in nonobese ([place]) 04/01/2017  . Hyperglycemia 03/25/2017  . Chest wall pain 03/21/2017  . 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 Chronic fatigue 12/24/2016  . Iron deficiency anemia due to chronic blood loss 12/10/2016  . HTN (hypertension) 02/01/2016  . Encounter for antineoplastic immunotherapy 12/21/2015  . Adenocarcinoma of right lung, stage 4 ([place]) 11/30/2015  . Encounter for antineoplastic chemotherapy 11/30/2015  . Tobacco abuse 11/10/2015  . Lung mass 10/11/2015    Past Surgical History:  Procedure Laterality Date  . ABDOMINAL AORTOGRAM W/LOWER EXTREMITY N/A 05/29/2017   Procedure: ABDOMINAL AORTOGRAM W/LOWER EXTREMITY;  Surgeon: Serafina Mitchell, MD;  Location: [facility];  Service: Cardiovascular;  Laterality: N/A;  . ABDOMINAL HYSTERECTOMY    . OOPHORECTOMY  1996   tumor removed from right ovary  . PERIPHERAL VASCULAR ATHERECTOMY  05/29/2017   Procedure:  PERIPHERAL VASCULAR ATHERECTOMY;  Surgeon: Serafina Mitchell, MD;  Location: [facility];  Service: Cardiovascular;;  right superficiacl femoral  . TONSILLECTOMY    . VIDEO BRONCHOSCOPY WITH ENDOBRONCHIAL NAVIGATION N/A 11/18/2015   Procedure: VIDEO BRONCHOSCOPY WITH ENDOBRONCHIAL NAVIGATION;  Surgeon: Melrose Nakayama, MD;  Location: [facility];  Service:  Thoracic;  Laterality: N/A;  . VIDEO BRONCHOSCOPY WITH ENDOBRONCHIAL ULTRASOUND N/A 11/18/2015   Procedure: VIDEO BRONCHOSCOPY WITH ENDOBRONCHIAL ULTRASOUND;  Surgeon: Melrose Nakayama, MD;  Location: [facility];  Service: Thoracic;  Laterality: N/A;     OB History   None      Home Medications    Prior to Admission medications   Medication Sig Start Date End Date Taking? Authorizing Provider  aspirin EC 81 MG EC tablet Take 1 tablet (81 mg total) by mouth daily. 06/01/17   Mariel Aloe, MD  blood glucose meter kit and supplies KIT Dispense based on patient and insurance preference. Use up to four times daily as directed. (FOR ICD-9 250.00, 250.01). 04/11/17   Elodia Florence., MD  cetirizine (ZYRTEC) 10 MG tablet TAKE 1 TABLET(10 MG) BY MOUTH DAILY 06/04/17   Curt Bears, MD  clopidogrel (PLAVIX) 75 MG tablet Take 1 tablet (75 mg total) by mouth daily with breakfast. 06/01/17   Mariel Aloe, MD  diclofenac sodium (VOLTAREN) 1 % GEL Apply layer to chest 4 times daily as needed for pain. 06/12/17   [name], DO  famotidine (PEPCID AC) 10 MG chewable tablet Chew 10 mg by mouth daily as needed for heartburn.    [provider]  ferrous sulfate 325 (65 FE) MG tablet Take 325 mg by mouth daily.    [provider]  glimepiride (AMARYL) 2 MG tablet TAKE 1 TABLET(2 MG) BY MOUTH DAILY WITH BREAKFAST 06/28/17   Wendling, Crosby Oyster, DO  Insulin Glargine (LANTUS) 100 UNIT/ML Solostar Pen Inject 10 Units into the skin daily at 10 pm. 06/19/17   Wendling, Crosby Oyster, DO  insulin lispro (HUMALOG) 100 UNIT/ML KiwkPen Inject 0.03 mLs (3 Units total) into the skin 3 (three) times daily with meals. 05/31/17   Mariel Aloe, MD  Insulin Pen Needle (B-D ULTRAFINE III SHORT PEN) 31G X 8 MM MISC USE AS DIRECTED WITH INSULIN 05/16/17   Wendling, Crosby Oyster, DO  levothyroxine (SYNTHROID, LEVOTHROID) 100 MCG tablet TAKE 1 TABLET(100 MCG) BY MOUTH DAILY BEFORE BREAKFAST  06/06/17   Curt Bears, MD  megestrol (MEGACE ES) 625 MG/5ML suspension Take 5 mLs (625 mg total) by mouth daily. 05/16/17   [name], DO  meloxicam (MOBIC) 15 MG tablet TAKE 1 TABLET(15 MG) BY MOUTH DAILY 05/16/17   Wendling, Crosby Oyster, DO  metFORMIN (GLUCOPHAGE) 500 MG tablet Take 2 tablets (1,000 mg total) by mouth 2 (two) times daily with a meal. (take as written in dc summary) 04/11/17 05/24/17  Elodia Florence., MD  metFORMIN (GLUCOPHAGE) 500 MG tablet TAKE 1 TABLET BY MOUTH IN THE MORNING FOR 1 WEEK THEN TAKE 1 TABLET BY MOUTH TWICE DAILY 06/24/17   [name], DO  Multiple Vitamin (MULTIVITAMIN) tablet Take 1 tablet by mouth daily.    [provider]  oxyCODONE-acetaminophen (PERCOCET) 10-325 MG tablet Take 1 tablet by mouth every 12 (twelve) hours as needed for pain. 06/28/17   [name], DO  protein supplement shake (PREMIER PROTEIN) LIQD Take 325 mLs (11 oz total) by mouth daily. 05/10/17   Charlynne Cousins, MD  Family History Family History  Problem Relation Age of Onset  . Colon cancer Neg Hx   . Esophageal cancer Neg Hx   . Rectal cancer Neg Hx   . Stomach cancer Neg Hx     Social History Social History   Tobacco Use  . Smoking status: Former Smoker    Packs/day: 0.50    Years: 30.00    Pack years: 15.00    Last attempt to quit: 11/01/2015    Years since quitting: 1.6  . Smokeless tobacco: Never Used  Substance Use Topics  . Alcohol use: No  . Drug use: No     Allergies   Penicillins   Review of Systems Review of Systems  Constitutional: Negative for chills and fever.  Respiratory: Positive for shortness of breath.   Cardiovascular: Positive for chest pain.  Gastrointestinal: Positive for nausea. Negative for abdominal pain, constipation, diarrhea and vomiting.  Allergic/Immunologic: Positive for immunocompromised state (DM; lung cancer).  All other systems reviewed and are  negative.    Physical Exam Updated Vital Signs BP (!) 93/57   Pulse (!) 116   Temp 97.6 F (36.4 C) (Oral)   Resp (!) 39   Ht 5' 3"  (1.6 m)   Wt 37.2 kg (82 lb)   SpO2 100%   BMI 14.53 kg/m   Physical Exam  Constitutional: She is oriented to person, place, and time. No distress.  Frail, cachectic appearing female  HENT:  Head: Normocephalic and atraumatic.  Cardiovascular: Normal heart sounds.  No murmur heard. Tachycardic but regular.  Pulmonary/Chest:  Tachypneic.  Speaks in broken sentences and appears winded with this.  Lungs clear to auscultation bilaterally.  Abdominal: Soft. She exhibits no distension. There is no tenderness.  Musculoskeletal: She exhibits no edema.  Neurological: She is alert and oriented to person, place, and time.  Skin: Skin is warm and dry.  Nursing note and vitals reviewed.    ED Treatments / Results  Labs (all labs ordered are listed, but only abnormal results are displayed) Labs Reviewed  COMPREHENSIVE METABOLIC PANEL - Abnormal; Notable for the following components:      Result Value   Potassium 5.3 (*)    CO2 8 (*)    Glucose, Bld 525 (*)    BUN 21 (*)    Creatinine, Ser 1.52 (*)    Calcium 8.6 (*)    Albumin 3.1 (*)    AST 387 (*)    ALT 283 (*)    Alkaline Phosphatase 529 (*)    Total Bilirubin 1.8 (*)    GFR calc non Af Amer 35 (*)    GFR calc Af Amer 40 (*)    Anion gap >20 (*)    All other components within normal limits  CBC WITH DIFFERENTIAL/PLATELET - Abnormal; Notable for the following components:   WBC 13.2 (*)    RBC 3.25 (*)    Hemoglobin 9.5 (*)    HCT 26.0 (*)    MCHC 36.5 (*)    Neutro Abs 9.9 (*)    Monocytes Absolute 1.3 (*)    All other components within normal limits  CBG MONITORING, ED - Abnormal; Notable for the following components:   Glucose-Capillary 495 (*)    All other components within normal limits  I-STAT ARTERIAL BLOOD GAS, ED - Abnormal; Notable for the following components:   pH,  Arterial 7.184 (*)    pCO2 arterial 16.1 (*)    Bicarbonate 6.1 (*)    TCO2 7 (*)  Acid-base deficit 20.0 (*)    All other components within normal limits  CBG MONITORING, ED - Abnormal; Notable for the following components:   Glucose-Capillary 411 (*)    All other components within normal limits  TROPONIN I  URINALYSIS, ROUTINE W REFLEX MICROSCOPIC    EKG EKG Interpretation  Date/Time:  Saturday Jul 06 2017 12:11:22 EDT Ventricular Rate:  113 PR Interval:  136 QRS Duration: 66 QT Interval:  306 QTC Calculation: 419 R Axis:   58 Text Interpretation:  Sinus tachycardia Nonspecific ST and T wave abnormality No significant change since last tracing Confirmed by Blanchie Dessert [phone]) on 07/06/2017 12:33:50 PM   Radiology Dg Chest 2 View  Result Date: 07/06/2017 CLINICAL DATA:  Severe chest pain, heart racing for while but worse this morning, awoke patient from sleep, RIGHT shoulder and chest pain, stage IV lung cancer on chemotherapy, tachypnea, hypertension, type II diabetes mellitus, former smoker EXAM: CHEST - 2 VIEW COMPARISON:  05/23/2017; correlation interval CT chest 06/10/2017 FINDINGS: Borderline enlargement of cardiac silhouette. Atherosclerotic calcification aorta. Mediastinal contours and pulmonary vascularity normal. Emphysematous changes consistent with COPD. No definite acute infiltrate, pleural effusion or pneumothorax. Bones demineralized. IMPRESSION: COPD changes without acute abnormality. Electronically Signed   By: Lavonia Dana M.D.   On: 07/06/2017 13:31    Procedures Procedures (including critical care time)  CRITICAL CARE Performed by: Ozella Almond Tyler Cubit   Total critical care time: 45 minutes  Critical care time was exclusive of separately billable procedures and treating other patients.  Critical care was necessary to treat or prevent imminent or life-threatening deterioration.  Critical care was time spent personally by me on the following  activities: development of treatment plan with patient and/or surrogate as well as nursing, discussions with consultants, evaluation of patient's response to treatment, examination of patient, obtaining history from patient or surrogate, ordering and performing treatments and interventions, ordering and review of laboratory studies, ordering and review of radiographic studies, pulse oximetry and re-evaluation of patient's condition.   Medications Ordered in ED Medications  insulin regular (NOVOLIN R,HUMULIN R) 100 Units in sodium chloride 0.9 % 100 mL (1 Units/mL) infusion (3.5 Units/hr Intravenous New Bag/Given 07/06/17 1525)  dextrose 5 %-0.45 % sodium chloride infusion (has no administration in time range)  0.9 %  sodium chloride infusion (has no administration in time range)  sodium chloride 0.9 % bolus 1,000 mL (0 mLs Intravenous Stopped 07/06/17 1416)  sodium chloride 0.9 % bolus 1,000 mL (1,000 mLs Intravenous New Bag/Given 07/06/17 1416)  ondansetron (ZOFRAN) injection 4 mg (4 mg Intravenous Given 07/06/17 1414)     Initial Impression / Assessment and Plan / ED Course  I have reviewed the triage vital signs and the nursing notes.  Pertinent labs & imaging results that were available during my care of the patient were reviewed by me and considered in my medical decision making (see chart for details).    Mercedez Boule is a 67 y.o. female who presents to ED for progressively worsening shortness of breath over the last 2-3 days which acutely worsened this morning. Patient with history of stage IV lung cancer currently being followed by oncology and receiving Keytruda infusions. Hx of DM since diagnosis and struggled with glycemic control. Had two admissions in the last few months for DKA. On exam today, she is afebrile. Family denies history of recent illness or infectious symptoms. She is quite tachypneic however oxygenating well.  Lungs are clear to auscultation bilaterally.  Chest x-ray without  acute  findings. Labs reviewed and c/w DKA. Glucose of 525, CO2 8, AG > 20. VBG with ph of 7.18. Patient received 2L IV fluids, then started on glucostabilizer. Patient does have quite increased AST/ALT/Alk phos compared to previous. Possibly due to dehydration / DKA process. She is not complaining of any abdominal pain and does not have any overt focal tenderness on exam. Hospitalist consulted who will admit for further care.   Patient seen by and discussed with Dr. Maryan Rued who agrees with treatment plan.    Addendum: Patient re-evaluated. Mental status seems to have declined. She appears more confused and having difficulty answering questions for me. Repeat BMP obtained with no improvement. CO2 <7, glucose of 543. Repeat ABG with ph of 7.217 which is slightly improved. Rectal temp obtained and still afebrile. Blood cx obtained as well. Repeat CXR reassuring without signs of pulmonary edema. New attending, Dr. Darl Householder, notified of patient's decline - he independently performed further critical care time as documented in his separate progress note. He evaluated patient and ordered 10U insulin bolus and 1 amp bicarb. Patient re-evaluated and appears improved. She states that she feels better and is answering questions easily and much clearer. Mental status improved. Another amp bicarb ordered. Maintaining airway. Has a step down bed at [facility]. [facility] notified and she is next for transfer.    Final Clinical Impressions(s) / ED Diagnoses   Final diagnoses:  Shortness of breath  Diabetic ketoacidosis without coma associated with diabetes mellitus due to underlying condition [facility])    ED Discharge Orders    None       Samaira Holzworth, Ozella Almond, PA-C 07/06/17 1532    Tenley Winward, Ozella Almond, PA-C 07/06/17 1929    Blanchie Dessert, MD 07/07/17 [phone]

## 2017-07-06 NOTE — ED Provider Notes (Signed)
  Physical Exam  BP 104/70   Pulse (!) 124   Temp 98.8 F (37.1 C) (Rectal)   Resp (!) 28   Ht 5\' 3"  (1.6 m)   Wt 37.2 kg (82 lb)   SpO2 99%   BMI 14.53 kg/m   Physical Exam  ED Course/Procedures     Procedures  MDM  I was called to evaluate the patient around 6:15 pm. Patient was admitted for DKA and is on glucostabilizer.  Her ABG showed pH of 7.1.  I was called in the room because her repeat BMP showed bicarb less than 7 and her mental status worsened.  On my initial exam, patient appears very confused but is arousable.  She is very tachypneic. CBG was 467. I ordered 10 U insulin bolus, 1 amp bicarb.   7 pm Repeat CBG was 476. Her repeat ABG showed pH 7.2. Her repeat CXR showed no pulmonary edema. She is more awake and alert now, tachypnea improved. States that she felt better. She has stepdown bed at [facility]. Doesn't require intubation currently.   CRITICAL CARE Performed by: Wandra Arthurs   Total critical care time: 40 minutes  Critical care time was exclusive of separately billable procedures and treating other patients.  Critical care was necessary to treat or prevent imminent or life-threatening deterioration.  Critical care was time spent personally by me on the following activities: development of treatment plan with patient and/or surrogate as well as nursing, discussions with consultants, evaluation of patient's response to treatment, examination of patient, obtaining history from patient or surrogate, ordering and performing treatments and interventions, ordering and review of laboratory studies, ordering and review of radiographic studies, pulse oximetry and re-evaluation of patient's condition.     Drenda Freeze, MD 07/06/17 [phone]

## 2017-07-06 NOTE — ED Notes (Signed)
Left Radial Artery x 1 attempt for ABG. No complications noted. Bleeding stopped. Results pending.

## 2017-07-06 NOTE — ED Notes (Signed)
IV attempted x 2 by this RN without success

## 2017-07-07 ENCOUNTER — Inpatient Hospital Stay (HOSPITAL_COMMUNITY): Payer: Medicare Other

## 2017-07-07 DIAGNOSIS — E0810 Diabetes mellitus due to underlying condition with ketoacidosis without coma: Secondary | ICD-10-CM

## 2017-07-07 DIAGNOSIS — D649 Anemia, unspecified: Secondary | ICD-10-CM | POA: Diagnosis present

## 2017-07-07 DIAGNOSIS — N289 Disorder of kidney and ureter, unspecified: Secondary | ICD-10-CM

## 2017-07-07 DIAGNOSIS — E875 Hyperkalemia: Secondary | ICD-10-CM | POA: Insufficient documentation

## 2017-07-07 DIAGNOSIS — I361 Nonrheumatic tricuspid (valve) insufficiency: Secondary | ICD-10-CM

## 2017-07-07 DIAGNOSIS — R0602 Shortness of breath: Secondary | ICD-10-CM

## 2017-07-07 LAB — BASIC METABOLIC PANEL
Anion gap: 17 — ABNORMAL HIGH (ref 5–15)
Anion gap: 10 (ref 5–15)
Anion gap: 11 (ref 5–15)
Anion gap: 12 (ref 5–15)
BUN: 17 mg/dL (ref 6–20)
BUN: 15 mg/dL (ref 6–20)
BUN: 13 mg/dL (ref 6–20)
BUN: 11 mg/dL (ref 6–20)
CHLORIDE: 113 mmol/L — AB (ref 101–111)
CHLORIDE: 109 mmol/L (ref 101–111)
CHLORIDE: 108 mmol/L (ref 101–111)
CHLORIDE: 111 mmol/L (ref 101–111)
CO2: 14 mmol/L — AB (ref 22–32)
CO2: 23 mmol/L (ref 22–32)
CO2: 21 mmol/L — AB (ref 22–32)
CO2: 21 mmol/L — AB (ref 22–32)
CREATININE: 1.1 mg/dL — AB (ref 0.44–1.00)
CREATININE: 1.04 mg/dL — AB (ref 0.44–1.00)
Calcium: 7.7 mg/dL — ABNORMAL LOW (ref 8.9–10.3)
Calcium: 7.8 mg/dL — ABNORMAL LOW (ref 8.9–10.3)
Calcium: 7.9 mg/dL — ABNORMAL LOW (ref 8.9–10.3)
Calcium: 8.1 mg/dL — ABNORMAL LOW (ref 8.9–10.3)
Creatinine, Ser: 0.96 mg/dL (ref 0.44–1.00)
Creatinine, Ser: 0.81 mg/dL (ref 0.44–1.00)
GFR calc Af Amer: 59 mL/min — ABNORMAL LOW (ref >60)
GFR calc Af Amer: >60 mL/min (ref >60)
GFR calc Af Amer: >60 mL/min (ref >60)
GFR calc Af Amer: >60 mL/min (ref >60)
GFR calc non Af Amer: 51 mL/min — ABNORMAL LOW (ref >60)
GFR calc non Af Amer: 55 mL/min — ABNORMAL LOW (ref >60)
GFR calc non Af Amer: >60 mL/min (ref >60)
GFR calc non Af Amer: >60 mL/min (ref >60)
GLUCOSE: 189 mg/dL — AB (ref 65–99)
GLUCOSE: 284 mg/dL — AB (ref 65–99)
Glucose, Bld: 129 mg/dL — ABNORMAL HIGH (ref 65–99)
Glucose, Bld: 52 mg/dL — ABNORMAL LOW (ref 65–99)
POTASSIUM: 4.4 mmol/L (ref 3.5–5.1)
POTASSIUM: 3.4 mmol/L — AB (ref 3.5–5.1)
POTASSIUM: 2.8 mmol/L — AB (ref 3.5–5.1)
POTASSIUM: 2.7 mmol/L — AB (ref 3.5–5.1)
SODIUM: 140 mmol/L (ref 135–145)
Sodium: 144 mmol/L (ref 135–145)
Sodium: 142 mmol/L (ref 135–145)
Sodium: 144 mmol/L (ref 135–145)

## 2017-07-07 LAB — COMPREHENSIVE METABOLIC PANEL
ALT: 213 U/L — ABNORMAL HIGH (ref 14–54)
AST: 234 U/L — AB (ref 15–41)
Albumin: 2.5 g/dL — ABNORMAL LOW (ref 3.5–5.0)
Alkaline Phosphatase: 423 U/L — ABNORMAL HIGH (ref 38–126)
Anion gap: 12 (ref 5–15)
BUN: 18 mg/dL (ref 6–20)
CHLORIDE: 110 mmol/L (ref 101–111)
CO2: 22 mmol/L (ref 22–32)
Calcium: 7.8 mg/dL — ABNORMAL LOW (ref 8.9–10.3)
Creatinine, Ser: 1.09 mg/dL — ABNORMAL HIGH (ref 0.44–1.00)
GFR calc Af Amer: 60 mL/min — ABNORMAL LOW (ref >60)
GFR, EST NON AFRICAN AMERICAN: 52 mL/min — AB (ref >60)
Glucose, Bld: 127 mg/dL — ABNORMAL HIGH (ref 65–99)
POTASSIUM: 4 mmol/L (ref 3.5–5.1)
Sodium: 144 mmol/L (ref 135–145)
Total Bilirubin: 0.7 mg/dL (ref 0.3–1.2)
Total Protein: 6.1 g/dL — ABNORMAL LOW (ref 6.5–8.1)

## 2017-07-07 LAB — VITAMIN B12: Vitamin B-12: 4590 pg/mL — ABNORMAL HIGH (ref 180–914)

## 2017-07-07 LAB — GLUCOSE, CAPILLARY
GLUCOSE-CAPILLARY: 110 mg/dL — AB (ref 65–99)
GLUCOSE-CAPILLARY: 80 mg/dL (ref 65–99)
GLUCOSE-CAPILLARY: 123 mg/dL — AB (ref 65–99)
GLUCOSE-CAPILLARY: 85 mg/dL (ref 65–99)
GLUCOSE-CAPILLARY: 142 mg/dL — AB (ref 65–99)
GLUCOSE-CAPILLARY: 142 mg/dL — AB (ref 65–99)
GLUCOSE-CAPILLARY: 159 mg/dL — AB (ref 65–99)
GLUCOSE-CAPILLARY: 205 mg/dL — AB (ref 65–99)
GLUCOSE-CAPILLARY: 84 mg/dL (ref 65–99)
Glucose-Capillary: 124 mg/dL — ABNORMAL HIGH (ref 65–99)
Glucose-Capillary: 139 mg/dL — ABNORMAL HIGH (ref 65–99)
Glucose-Capillary: 147 mg/dL — ABNORMAL HIGH (ref 65–99)
Glucose-Capillary: 226 mg/dL — ABNORMAL HIGH (ref 65–99)
Glucose-Capillary: 272 mg/dL — ABNORMAL HIGH (ref 65–99)
Glucose-Capillary: 78 mg/dL (ref 65–99)

## 2017-07-07 LAB — PROTIME-INR
INR: 1.08
Prothrombin Time: 13.9 seconds (ref 11.4–15.2)

## 2017-07-07 LAB — FERRITIN: Ferritin: 89 ng/mL (ref 11–307)

## 2017-07-07 LAB — MRSA PCR SCREENING: MRSA BY PCR: NEGATIVE

## 2017-07-07 LAB — ECHOCARDIOGRAM COMPLETE
HEIGHTINCHES: 63 in
WEIGHTICAEL: 1252.21 [oz_av]

## 2017-07-07 LAB — TROPONIN I
TROPONIN I: 0.11 ng/mL — AB (ref <0.03)
TROPONIN I: 0.09 ng/mL — AB (ref <0.03)
Troponin I: 0.07 ng/mL (ref <0.03)

## 2017-07-07 LAB — IRON AND TIBC
IRON: 44 ug/dL (ref 28–170)
Saturation Ratios: 18 % (ref 10.4–31.8)
TIBC: 239 ug/dL — AB (ref 250–450)
UIBC: 195 ug/dL

## 2017-07-07 MED ORDER — LEVOTHYROXINE SODIUM 100 MCG PO TABS
100.0000 ug | ORAL_TABLET | Freq: Every day | ORAL | Status: DC
  Administered 2017-07-07 – 2017-07-12 (×6): 100 ug via ORAL
  Filled 2017-07-07 (×6): qty 1

## 2017-07-07 MED ORDER — LORATADINE 10 MG PO TABS
10.0000 mg | ORAL_TABLET | Freq: Every day | ORAL | Status: DC
  Administered 2017-07-07 – 2017-07-12 (×6): 10 mg via ORAL
  Filled 2017-07-07 (×6): qty 1

## 2017-07-07 MED ORDER — INSULIN ASPART 100 UNIT/ML ~~LOC~~ SOLN: 0.0000 [IU] | Freq: Every day | SUBCUTANEOUS | Status: DC

## 2017-07-07 MED ORDER — SODIUM CHLORIDE 0.9 % IV SOLN: INTRAVENOUS | Status: AC

## 2017-07-07 MED ORDER — TECHNETIUM TC 99M DIETHYLENETRIAME-PENTAACETIC ACID
28.0000 | Freq: Once | INTRAVENOUS | Status: AC | PRN
  Administered 2017-07-07: 28 via INTRAVENOUS

## 2017-07-07 MED ORDER — ENOXAPARIN SODIUM 40 MG/0.4ML ~~LOC~~ SOLN
35.0000 mg | Freq: Once | SUBCUTANEOUS | Status: AC
  Administered 2017-07-07: 35 mg via SUBCUTANEOUS
  Filled 2017-07-07: qty 0.4

## 2017-07-07 MED ORDER — INSULIN GLARGINE 100 UNIT/ML ~~LOC~~ SOLN
10.0000 [IU] | Freq: Every day | SUBCUTANEOUS | Status: DC
  Administered 2017-07-07: 10 [IU] via SUBCUTANEOUS
  Filled 2017-07-07 (×2): qty 0.1

## 2017-07-07 MED ORDER — FAMOTIDINE 20 MG PO TABS: 10.0000 mg | ORAL_TABLET | Freq: Every day | ORAL | Status: DC | PRN

## 2017-07-07 MED ORDER — TECHNETIUM TO 99M ALBUMIN AGGREGATED
4.0000 | Freq: Once | INTRAVENOUS | Status: AC | PRN
  Administered 2017-07-07: 4 via INTRAVENOUS

## 2017-07-07 MED ORDER — POTASSIUM CHLORIDE CRYS ER 20 MEQ PO TBCR
40.0000 meq | EXTENDED_RELEASE_TABLET | Freq: Two times a day (BID) | ORAL | Status: DC
  Administered 2017-07-07 – 2017-07-10 (×6): 40 meq via ORAL
  Filled 2017-07-07 (×6): qty 2

## 2017-07-07 MED ORDER — SODIUM CHLORIDE 0.9 % IV SOLN: INTRAVENOUS | Status: DC

## 2017-07-07 MED ORDER — FERROUS SULFATE 325 (65 FE) MG PO TABS
325.0000 mg | ORAL_TABLET | Freq: Every day | ORAL | Status: DC
  Administered 2017-07-07 – 2017-07-12 (×6): 325 mg via ORAL
  Filled 2017-07-07 (×6): qty 1

## 2017-07-07 MED ORDER — ASPIRIN EC 81 MG PO TBEC
81.0000 mg | DELAYED_RELEASE_TABLET | Freq: Every day | ORAL | Status: DC
  Administered 2017-07-07 – 2017-07-12 (×6): 81 mg via ORAL
  Filled 2017-07-07 (×6): qty 1

## 2017-07-07 MED ORDER — INSULIN ASPART 100 UNIT/ML ~~LOC~~ SOLN
0.0000 [IU] | Freq: Three times a day (TID) | SUBCUTANEOUS | Status: DC
  Administered 2017-07-07: 2 [IU] via SUBCUTANEOUS
  Administered 2017-07-08: 8 [IU] via SUBCUTANEOUS
  Administered 2017-07-08: 11 [IU] via SUBCUTANEOUS
  Administered 2017-07-09: 5 [IU] via SUBCUTANEOUS
  Administered 2017-07-09: 2 [IU] via SUBCUTANEOUS
  Administered 2017-07-09: 11 [IU] via SUBCUTANEOUS
  Administered 2017-07-10: 15 [IU] via SUBCUTANEOUS

## 2017-07-07 MED ORDER — PRO-STAT SUGAR FREE PO LIQD
30.0000 mL | Freq: Two times a day (BID) | ORAL | Status: DC
  Administered 2017-07-08 – 2017-07-11 (×6): 30 mL via ORAL
  Filled 2017-07-07 (×9): qty 30

## 2017-07-07 MED ORDER — CLOPIDOGREL BISULFATE 75 MG PO TABS
75.0000 mg | ORAL_TABLET | Freq: Every day | ORAL | Status: DC
  Administered 2017-07-07 – 2017-07-11 (×5): 75 mg via ORAL
  Filled 2017-07-07 (×5): qty 1

## 2017-07-07 MED ORDER — DEXTROSE-NACL 5-0.45 % IV SOLN
INTRAVENOUS | Status: DC
  Administered 2017-07-07 (×2): via INTRAVENOUS

## 2017-07-07 NOTE — Progress Notes (Signed)
Spoke with Museum/gallery conservator from [employer] [facility], will plan for test in 2 hours

## 2017-07-07 NOTE — Progress Notes (Signed)
CRITICAL VALUE ALERT  Critical Value:  Troponin 0.11   Date & Time Notied:  07/07/2017 0714   Provider Notified: Dr. Wendee Beavers  Orders Received/Actions taken: Awaiting further orders

## 2017-07-07 NOTE — H&P (Addendum)
TRH H&P   Patient Demographics:    Brittany Mercado, is a 67 y.o. female  MRN: [id number]   DOB - 04/06/1950  Admit Date - 07/06/2017  Outpatient Primary MD for the patient is Nani Ravens, Crosby Oyster, DO  Referring MD/NP/PA: Shirlyn Goltz  Outpatient Specialists:     Patient coming from: home=> med center [facility]  Chief Complaint  Patient presents with  . Chest Pain   No chest pain,  Nausea is the chief complaint   HPI:    Brittany Mercado  is a 67 y.o. female,w stage 4 lung cancer, dm2, apparently on insulin at home presents with c/o nausea, and not feeling well.  Pt states that her sugars were high and therefore presented to the ED.  Pt denies fever, chills, cp, palp, abd pain, diarrhea, brbpr, black stool.  Pt is not sure why her sugar was high.  Pt denies missing her medication.   In ED,  CXR IMPRESSION: COPD changes without acute abnormality.  IMPRESSION: Question pulmonary vascular congestion.  Otherwise unchanged appearance of the chest with RIGHT hilar/perihilar mass.  Na 137, K 5.3, Bun 21, Creatinine 1.52 Ast 387, Alt 283 Alk pho0s 529, T. Bili 1.8 Wbc 13.2, Hgb 9.5, Pl 319 Trop <0.03  Pt will be admitted for w/up of DKA,    Review of systems:    In addition to the HPI above,  No Fever-chills, No Headache, No changes with Vision or hearing, No problems swallowing food or Liquids, No Chest pain, Cough or Shortness of Breath, No Abdominal pain, , Bowel movements are regular, No Blood in stool or Urine, No dysuria, No new skin rashes or bruises, No new joints pains-aches,  No new weakness, tingling, numbness in any extremity, No recent weight gain or loss, No polyuria, polydypsia or polyphagia, No significant Mental Stressors.  A full 10 point Review of Systems was done, except as stated above, all other Review of Systems were negative.   With Past History of  the following :    Past Medical History:  Diagnosis Date  . Adenocarcinoma of right lung, stage 4 ([place]) 11/30/2015  . Adult failure to thrive   . Complication of anesthesia    hard to wake up  . Diabetes mellitus type 2 in nonobese (HCC)   . Eczema   . Encounter for antineoplastic chemotherapy 11/30/2015  . Heart murmur    told at age 6 but never has had any problems  . History of radiation therapy 01/26/16-02/07/16   right pelvis 30 Gy in 10 fractions  . HTN (hypertension) 02/01/2016  . 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 Iron deficiency anemia due to chronic blood loss 12/10/2016      Past Surgical History:  Procedure Laterality Date  . ABDOMINAL AORTOGRAM W/LOWER EXTREMITY N/A 05/29/2017   Procedure: ABDOMINAL AORTOGRAM W/LOWER EXTREMITY;  Surgeon: Serafina Mitchell, MD;  Location: [place] [facility];  Service: Cardiovascular;  Laterality: N/A;  . ABDOMINAL HYSTERECTOMY    . OOPHORECTOMY  1996   tumor removed from right ovary  . PERIPHERAL VASCULAR ATHERECTOMY  05/29/2017   Procedure: PERIPHERAL VASCULAR ATHERECTOMY;  Surgeon: Serafina Mitchell, MD;  Location: [place] [facility];  Service: Cardiovascular;;  right superficiacl femoral  . TONSILLECTOMY    . VIDEO BRONCHOSCOPY WITH ENDOBRONCHIAL NAVIGATION N/A 11/18/2015   Procedure: VIDEO BRONCHOSCOPY WITH ENDOBRONCHIAL NAVIGATION;  Surgeon: Melrose Nakayama, MD;  Location: [place];  Service: Thoracic;  Laterality: N/A;  . VIDEO BRONCHOSCOPY WITH ENDOBRONCHIAL ULTRASOUND N/A 11/18/2015   Procedure: VIDEO BRONCHOSCOPY WITH ENDOBRONCHIAL ULTRASOUND;  Surgeon: Melrose Nakayama, MD;  Location: [place];  Service: Thoracic;  Laterality: N/A;      Social History:     Social History   Tobacco Use  . Smoking status: Former Smoker    Packs/day: 0.50    Years: 30.00    Pack years: 15.00    Last attempt to quit: 11/01/2015    Years since quitting: 1.6  . Smokeless tobacco: Never Used  Substance Use Topics  . Alcohol use: No      Lives - at home  Mobility - unclear   Family History :     Family History  Problem Relation Age of Onset  . Colon cancer Neg Hx   . Esophageal cancer Neg Hx   . Rectal cancer Neg Hx   . Stomach cancer Neg Hx        Home Medications:   Prior to Admission medications   Medication Sig Start Date End Date Taking? Authorizing Provider  aspirin EC 81 MG EC tablet Take 1 tablet (81 mg total) by mouth daily. 06/01/17  Yes Mariel Aloe, MD  cetirizine (ZYRTEC) 10 MG tablet TAKE 1 TABLET(10 MG) BY MOUTH DAILY 06/04/17  Yes Curt Bears, MD  clopidogrel (PLAVIX) 75 MG tablet Take 1 tablet (75 mg total) by mouth daily with breakfast. 06/01/17  Yes Mariel Aloe, MD  ferrous sulfate 325 (65 FE) MG tablet Take 325 mg by mouth daily.   Yes [provider]  glimepiride (AMARYL) 2 MG tablet TAKE 1 TABLET(2 MG) BY MOUTH DAILY WITH BREAKFAST 06/28/17  Yes Wendling, Crosby Oyster, DO  Insulin Glargine (LANTUS) 100 UNIT/ML Solostar Pen Inject 10 Units into the skin daily at 10 pm. 06/19/17  Yes Wendling, Crosby Oyster, DO  insulin lispro (HUMALOG) 100 UNIT/ML KiwkPen Inject 0.03 mLs (3 Units total) into the skin 3 (three) times daily with meals. 05/31/17  Yes Mariel Aloe, MD  levothyroxine (SYNTHROID, LEVOTHROID) 100 MCG tablet TAKE 1 TABLET(100 MCG) BY MOUTH DAILY BEFORE BREAKFAST 06/06/17  Yes Curt Bears, MD  megestrol (MEGACE ES) 625 MG/5ML suspension Take 5 mLs (625 mg total) by mouth daily. 05/16/17  Yes [name], DO  metFORMIN (GLUCOPHAGE) 500 MG tablet TAKE 1 TABLET BY MOUTH IN THE MORNING FOR 1 WEEK THEN TAKE 1 TABLET BY MOUTH TWICE DAILY 06/24/17  Yes [name], DO  Multiple Vitamin (MULTIVITAMIN) tablet Take 1 tablet by mouth daily.   Yes [provider]  blood glucose meter kit and supplies KIT Dispense based on patient and insurance preference. Use up to four times daily as directed. (FOR ICD-9 250.00, 250.01). 04/11/17   Elodia Florence., MD  diclofenac sodium (VOLTAREN) 1 % GEL Apply layer to chest 4 times daily as needed for pain. 06/12/17   [name], DO  famotidine (PEPCID AC) 10  MG chewable tablet Chew 10 mg by mouth daily as needed for heartburn.    [provider]  Insulin Pen Needle (B-D ULTRAFINE III SHORT PEN) 31G X 8 MM MISC USE AS DIRECTED WITH INSULIN 05/16/17   [name], DO  meloxicam (MOBIC) 15 MG tablet TAKE 1 TABLET(15 MG) BY MOUTH DAILY Patient not taking: Reported on 07/06/2017 05/16/17   [name], DO  meloxicam (MOBIC) 7.5 MG tablet Take 7.5 mg by mouth daily as needed for pain.  05/27/17   [provider]  metFORMIN (GLUCOPHAGE) 500 MG tablet Take 2 tablets (1,000 mg total) by mouth 2 (two) times daily with a meal. (take as written in dc summary) 04/11/17 05/24/17  Elodia Florence., MD  oxyCODONE-acetaminophen (PERCOCET) 10-325 MG tablet Take 1 tablet by mouth every 12 (twelve) hours as needed for pain. 06/28/17   [name], DO  protein supplement shake (PREMIER PROTEIN) LIQD Take 325 mLs (11 oz total) by mouth daily. 05/10/17   Charlynne Cousins, MD     Allergies:     Allergies  Allergen Reactions  . Penicillins Swelling    SWELLING REACTION UNSPECIFIED  Has patient had a PCN reaction causing immediate rash, facial/tongue/throat swelling, SOB or lightheadedness with hypotension: Yes Has patient had a PCN reaction causing severe rash involving mucus membranes or skin necrosis: No Has patient had a PCN reaction that required hospitalization No Has patient had a PCN reaction occurring within the last 10 years: No If all of the above answers are "NO", then may proceed with Cephalosporin use.     Physical Exam:   Vitals  Blood pressure (!) 127/51, pulse 98, temperature 98.2 F (36.8 C), temperature source Oral, resp. rate (!) 28, height _0  (1.6 m), weight 35.5 kg (78 lb 4.2 oz), SpO2 100 %.   1. General  lying in bed in  NAD,   2. Normal affect and insight, Not Suicidal or Homicidal, Awake Alert, Oriented X 2 (person,  Place).  3. No F.N deficits, ALL C.Nerves Intact, Strength 5/5 all 4 extremities, Sensation intact all 4 extremities, Plantars down going.  4. Ears and Eyes appear Normal, Conjunctivae clear, PERRLA. Moist Oral Mucosa.  5. Supple Neck, No JVD, No cervical lymphadenopathy appriciated, No Carotid Bruits.  6. Symmetrical Chest wall movement, Good air movement bilaterally, CTAB.  7. RRR, No Gallops, Rubs or Murmurs, No Parasternal Heave.  8. Positive Bowel Sounds, Abdomen Soft, No tenderness, No organomegaly appriciated,No rebound -guarding or rigidity.  9.  No Cyanosis, Normal Skin Turgor, No Skin Rash or Bruise.  10. Good muscle tone,  joints appear normal , no effusions, Normal ROM.  11. No Palpable Lymph Nodes in Neck or Axillae     Data Review:    CBC Recent Labs  Lab 07/06/17 1312  WBC 13.2*  HGB 9.5*  HCT 26.0*  PLT 319  MCV 80.0  MCH 29.2  MCHC 36.5*  RDW 15.0  LYMPHSABS 2.0  MONOABS 1.3*  EOSABS 0.0  BASOSABS 0.0   ------------------------------------------------------------------------------------------------------------------  Chemistries  Recent Labs  Lab 07/06/17 1312 07/06/17 1650  NA 137 136  K 5.3* 5.7*  CL 101 107  CO2 8* <7*  GLUCOSE 525* 543*  BUN 21* 22*  CREATININE 1.52* 1.51*  CALCIUM 8.6* 7.7*  AST 387*  --   ALT 283*  --   ALKPHOS 529*  --   BILITOT 1.8*  --    ------------------------------------------------------------------------------------------------------------------ estimated creatinine clearance is 20.5 mL/min (A) (by  C-G formula based on SCr of 1.51 mg/dL (H)). ------------------------------------------------------------------------------------------------------------------ No results for input(s): TSH, T4TOTAL, T3FREE, THYROIDAB in the last 72 hours.  Invalid input(s): FREET3  Coagulation profile No results for  input(s): INR, PROTIME in the last 168 hours. ------------------------------------------------------------------------------------------------------------------- No results for input(s): DDIMER in the last 72 hours. -------------------------------------------------------------------------------------------------------------------  Cardiac Enzymes Recent Labs  Lab 07/06/17 1312  TROPONINI <0.03   ------------------------------------------------------------------------------------------------------------------ No results found for: BNP   ---------------------------------------------------------------------------------------------------------------  Urinalysis    Component Value Date/Time   COLORURINE YELLOW 07/06/2017 [place] 07/06/2017 1246   LABSPEC 1.025 07/06/2017 1246   PHURINE 5.5 07/06/2017 1246   GLUCOSEU >=500 (A) 07/06/2017 1246   HGBUR TRACE (A) 07/06/2017 1246   BILIRUBINUR MODERATE (A) 07/06/2017 1246   KETONESUR >80 (A) 07/06/2017 1246   [place] 07/06/2017 1246   NITRITE NEGATIVE 07/06/2017 1246   LEUKOCYTESUR NEGATIVE 07/06/2017 1246    ----------------------------------------------------------------------------------------------------------------   Imaging Results:    Dg Chest 2 View  Result Date: 07/06/2017 CLINICAL DATA:  Severe chest pain, heart racing for while but worse this morning, awoke patient from sleep, RIGHT shoulder and chest pain, stage IV lung cancer on chemotherapy, tachypnea, hypertension, type II diabetes mellitus, former smoker EXAM: CHEST - 2 VIEW COMPARISON:  05/23/2017; correlation interval CT chest 06/10/2017 FINDINGS: Borderline enlargement of cardiac silhouette. Atherosclerotic calcification aorta. Mediastinal contours and pulmonary vascularity normal. Emphysematous changes consistent with COPD. No definite acute infiltrate, pleural effusion or pneumothorax. Bones demineralized. IMPRESSION: COPD changes without  acute abnormality. Electronically Signed   By: Lavonia Dana M.D.   On: 07/06/2017 13:31   Dg Chest Portable 1 View  Result Date: 07/06/2017 CLINICAL DATA:  Acute shortness of breath. History of stage IV lung cancer. EXAM: PORTABLE CHEST 1 VIEW COMPARISON:  07/06/2017 chest radiograph, 06/10/2017 CT and prior studies FINDINGS: RIGHT hilar/perihilar mass again noted. UPPER limits normal heart size identified. Apparent increase in pulmonary vascular congestion noted. No pleural effusion or pneumothorax noted. No acute bony abnormalities are present. IMPRESSION: Question pulmonary vascular congestion. Otherwise unchanged appearance of the chest with RIGHT hilar/perihilar mass. Electronically Signed   By: Margarette Canada M.D.   On: 07/06/2017 19:14    ST at 113, nl axis   Assessment & Plan:    Principal Problem:   DKA (diabetic ketoacidoses) (HCC) Active Problems:   Hyperkalemia   Anemia   Renal insufficiency    DKA NPO Ns iv Insulin iv Bmp q4h  Hyperkalemia Check bmp If still high, then calclium gluconate, sodium bicarb, kayexalate  Tachycardia and  ?sob on megace Trop I q6h x3 Check cardiac echo Check VQ scan  Check INR  Anemia Check cbc , ferritin, iron, tibc, b12, folate in am  Renal insufficiency STOP metformin  STOP meloxicam  Hypothyroidism Cont levothyroxine 100 micrograms po qday  DM2 Stop metformin Stop amaryl Stop lantus/ humalog Insulin iv as above  Stage 4 lung cancer w abnormal liver function Cont megace for appetite  Severe PVD Cont aspirin, plavix Check lipid  Severe protein calorie malnutrition Start prostat    DVT Prophylaxis  Lovenox - SCDs Not paged about this patient til 5am  AM Labs Ordered, also please review Full Orders  Family Communication: Admission, patients condition and plan of care including tests being ordered have been discussed with the patient    who indicate understanding and agree with the plan and Code Status.  Code  Status FULL CODE  Likely DC to home  Condition GUARDED    Consults  called: none  Admission status: inpatient  Time spent in minutes : 45 critical care   Jani Gravel M.D on 07/07/2017 at 5:03 AM  Between 7am to 7pm - Pager - [phone]  . After 7pm go to [email or web address] - password [facility]  [facility] - Office  [phone]

## 2017-07-07 NOTE — Progress Notes (Signed)
Pt seen and evaluated earlier this am by my associate. Please refer to h and p for details regarding assessment and plan. Pt undergoing further work up.  Complicated case in that patient has DKA and has acidosis related to that requiring glucose drip and fluids.  No chest pain on evaluation.  Will reassess next am.  Gen: pt in nad, alert and awake CV: no cyanosis Pulm: no increased wob, no wheezes  Velvet Bathe MD

## 2017-07-07 NOTE — Progress Notes (Signed)
  Echocardiogram 2D Echocardiogram has been performed.  Darlina Sicilian M 07/07/2017, 10:45 AM

## 2017-07-07 NOTE — Progress Notes (Signed)
Pt going to [facility] med for test, will hold pts am medications until after test

## 2017-07-07 NOTE — Progress Notes (Signed)
CRITICAL VALUE ALERT  Critical Value:  K+ 2.7  Date & Time Notied:  07/07/17 2045  Provider Notified: X. Blount  Orders Received/Actions taken: Provider prescribed 40 meq K+

## 2017-07-07 NOTE — Progress Notes (Signed)
Pt complaining of chest/ upper epigastric pain. She states it is a 7/10. Pt states it feels like it did when she presented to ED on 07/06/17. Troponins are elevated, EKG completed and Dr Wendee Beavers paged

## 2017-07-08 ENCOUNTER — Inpatient Hospital Stay (HOSPITAL_COMMUNITY): Payer: Medicare Other

## 2017-07-08 ENCOUNTER — Inpatient Hospital Stay: Payer: Medicare Other

## 2017-07-08 ENCOUNTER — Inpatient Hospital Stay: Payer: Medicare Other | Attending: Oncology | Admitting: Internal Medicine

## 2017-07-08 DIAGNOSIS — E876 Hypokalemia: Secondary | ICD-10-CM

## 2017-07-08 DIAGNOSIS — Z86711 Personal history of pulmonary embolism: Secondary | ICD-10-CM

## 2017-07-08 LAB — GLUCOSE, CAPILLARY
GLUCOSE-CAPILLARY: 152 mg/dL — AB (ref 65–99)
GLUCOSE-CAPILLARY: 283 mg/dL — AB (ref 65–99)
GLUCOSE-CAPILLARY: 122 mg/dL — AB (ref 65–99)
Glucose-Capillary: 12 mg/dL — CL (ref 65–99)
Glucose-Capillary: 30 mg/dL — CL (ref 65–99)
Glucose-Capillary: 317 mg/dL — ABNORMAL HIGH (ref 65–99)

## 2017-07-08 LAB — MAGNESIUM: MAGNESIUM: 1.1 mg/dL — AB (ref 1.7–2.4)

## 2017-07-08 LAB — FOLATE RBC
FOLATE, HEMOLYSATE: 470.1 ng/mL
Folate, RBC: 1741 ng/mL (ref >498)
Hematocrit: 27 % — ABNORMAL LOW (ref 34.0–46.6)

## 2017-07-08 LAB — HEPARIN LEVEL (UNFRACTIONATED)
Heparin Unfractionated: 0.21 IU/mL — ABNORMAL LOW (ref 0.30–0.70)
Heparin Unfractionated: 0.2 IU/mL — ABNORMAL LOW (ref 0.30–0.70)

## 2017-07-08 LAB — POTASSIUM: POTASSIUM: 4.6 mmol/L (ref 3.5–5.1)

## 2017-07-08 MED ORDER — DEXTROSE-NACL 5-0.45 % IV SOLN
INTRAVENOUS | Status: DC
  Administered 2017-07-08: 09:00:00 via INTRAVENOUS

## 2017-07-08 MED ORDER — IOPAMIDOL (ISOVUE-370) INJECTION 76%
100.0000 mL | Freq: Once | INTRAVENOUS | Status: AC | PRN
  Administered 2017-07-08: 58 mL via INTRAVENOUS

## 2017-07-08 MED ORDER — HEPARIN BOLUS VIA INFUSION
2000.0000 [IU] | Freq: Once | INTRAVENOUS | Status: AC
  Administered 2017-07-08: 2000 [IU] via INTRAVENOUS
  Filled 2017-07-08: qty 2000

## 2017-07-08 MED ORDER — HEPARIN (PORCINE) IN NACL 100-0.45 UNIT/ML-% IJ SOLN: 850.0000 [IU]/h | INTRAMUSCULAR | Status: DC

## 2017-07-08 MED ORDER — MORPHINE SULFATE (PF) 4 MG/ML IV SOLN
1.0000 mg | Freq: Once | INTRAVENOUS | Status: AC
  Administered 2017-07-08: 1 mg via INTRAVENOUS

## 2017-07-08 MED ORDER — HEPARIN BOLUS VIA INFUSION
1000.0000 [IU] | Freq: Once | INTRAVENOUS | Status: AC
  Administered 2017-07-08: 1000 [IU] via INTRAVENOUS
  Filled 2017-07-08: qty 1000

## 2017-07-08 MED ORDER — OXYCODONE HCL 5 MG PO TABS
5.0000 mg | ORAL_TABLET | Freq: Four times a day (QID) | ORAL | Status: DC | PRN
  Administered 2017-07-08 – 2017-07-09 (×2): 5 mg via ORAL
  Administered 2017-07-09: 10 mg via ORAL
  Administered 2017-07-10 – 2017-07-11 (×2): 5 mg via ORAL
  Administered 2017-07-11: 10 mg via ORAL
  Filled 2017-07-08 (×2): qty 1
  Filled 2017-07-08: qty 2
  Filled 2017-07-08 (×2): qty 1
  Filled 2017-07-08: qty 2

## 2017-07-08 MED ORDER — IOPAMIDOL (ISOVUE-370) INJECTION 76%
INTRAVENOUS | Status: AC
  Filled 2017-07-08: qty 100

## 2017-07-08 MED ORDER — HEPARIN (PORCINE) IN NACL 100-0.45 UNIT/ML-% IJ SOLN
700.0000 [IU]/h | INTRAMUSCULAR | Status: DC
  Administered 2017-07-08: 600 [IU]/h via INTRAVENOUS
  Filled 2017-07-08: qty 250

## 2017-07-08 MED ORDER — DEXTROSE 50 % IV SOLN
INTRAVENOUS | Status: AC
  Administered 2017-07-08: 50 mL
  Filled 2017-07-08: qty 50

## 2017-07-08 MED ORDER — POTASSIUM CHLORIDE 10 MEQ/100ML IV SOLN
10.0000 meq | INTRAVENOUS | Status: AC
  Administered 2017-07-08 (×3): 10 meq via INTRAVENOUS
  Filled 2017-07-08 (×3): qty 100

## 2017-07-08 MED ORDER — INSULIN GLARGINE 100 UNIT/ML ~~LOC~~ SOLN
5.0000 [IU] | Freq: Every day | SUBCUTANEOUS | Status: DC
  Administered 2017-07-08 – 2017-07-10 (×3): 5 [IU] via SUBCUTANEOUS
  Filled 2017-07-08 (×3): qty 0.05

## 2017-07-08 MED ORDER — MORPHINE SULFATE (PF) 4 MG/ML IV SOLN
INTRAVENOUS | Status: AC
  Filled 2017-07-08: qty 1

## 2017-07-08 MED ORDER — MORPHINE SULFATE (PF) 4 MG/ML IV SOLN
1.0000 mg | INTRAVENOUS | Status: DC | PRN
  Administered 2017-07-10 – 2017-07-15 (×4): 1 mg via INTRAVENOUS
  Filled 2017-07-08 (×4): qty 1

## 2017-07-08 NOTE — Progress Notes (Signed)
Hypoglycemic Event  CBG:12  Treatment: 50 % dextrose- 50 ml  Symptoms:shaky, sweating  Follow-up CBG: Time:8:15 CBG Result:152  Possible Reasons for Event Comments/MD notified:Yes    Benny Lennert

## 2017-07-08 NOTE — Progress Notes (Addendum)
Initial Nutrition Assessment  DOCUMENTATION CODES:   Severe malnutrition in context of chronic illness, Underweight  INTERVENTION:  Magic cup TID with meals, each supplement provides 290 kcal and 9 grams of protein MVI- daily Recommend appetite stimulant medication  NUTRITION DIAGNOSIS:   Severe Malnutrition related to cancer and cancer related treatments as evidenced by percent weight loss(11% in 2 months).  GOAL:   Patient will meet greater than or equal to 90% of their needs  MONITOR:   Supplement acceptance, PO intake, Skin, TF tolerance, Weight trends, Labs, I & O's  REASON FOR ASSESSMENT:   Other (Comment)(Pt familiar to [facility]; past malnutrition diagnosis with cancer)    ASSESSMENT:   67 y.o. F admitted on 07/06/17 for DKA with hypokalemia, anemia, and renal insufficiency. Pt with stg IV lung cancer with chemotherapy. Hx of radiation therapy (2017), and past severe malnutrition in context of chronic illness diagnosis (3/19). PMH of T2DM, htn, hypothyroidism, iron deficiency anemia due to chronic blood loss, adult failure to thrive, and heart murmur.   Past diagnosis of Severe Malnutrition related to chronic illness, catabolic illness, cancer and cancer related treatments as evidenced by estimated needs 3/19. - Ongoing  05/06/17 pt weighed 39.5kg, now 35.5 kg; 11% weight loss in 2 months - significant. Per chart pt is eating about 50% of meals.   Family at bedside while pt is at [place]. Family reports pt having trouble gaining weight, but has maintained weight since last admission 05/23/17-05/31/17. Family reports this is due to a medication she was taking to increase her appetite; she was hungry and eating all the food she could and would sometimes ask for more portions. Family reports the week leading up to this admission she wasn't eating much of anything; family suspects she was not taking her appetite pills. Family reports pt is due for treatment today.  Upon return visit  pt in bed without family at bedside. When asked what kind of treatment she was receiving pt reported "diabtetes treatment", when asking about her cancer treatments pt reports "that's another story"; pt reports next treatment is due 07/15/17.   When dietetic intern asked if pt would be ok with a physical exam to check her muscles pt responded "what muscles?". When asked if pt lost any weight recently pt laughed "of course!". Pt reports that since last admission she lost weight, but is not sure how much. Pt chart shows weight gain of 1 lb since last admission.  Pt reports her appetite is good, but she doesn't like ensure very much and would not prefer premier protein. Pt is open to trying magic cup as she likes ice cream.   Medications reviewed:  Pro-stat BID (not given 5/13 am due to pt refusal), ferrous sulfate, novolog 0-15 units (not given 5/13 am due to critically low CBGs), novolog 0-5 units, lantus 5 units, levothyroxine, potassium chloride, heparin.   Labs reviewed: K+ 2.7 (L), CO2 21 (L), BG 52 (L), Ca 8.1 (L), magnesium 1.1 (L).   Net I/O since admit: + 582 ml  Lab Results  Component Value Date   HGBA1C 8.8 (H) 05/09/2017    NUTRITION - FOCUSED PHYSICAL EXAM:    Most Recent Value  Orbital Region  Moderate depletion  Upper Arm Region  Severe depletion  Thoracic and Lumbar Region  Severe depletion  Buccal Region  Moderate depletion  Temple Region  Moderate depletion  Clavicle Bone Region  Severe depletion  Clavicle and Acromion Bone Region  Severe depletion  Scapular Bone Region  Unable to assess  Dorsal Hand  Severe depletion  Patellar Region  Severe depletion  Anterior Thigh Region  Severe depletion  Posterior Calf Region  Severe depletion  Edema (RD Assessment)  None  Hair  Unable to assess [wearing wig]  Eyes  Reviewed  Mouth  Reviewed  Skin  Reviewed  Nails  Unable to assess [nails painted]       Diet Order:   Diet Order           Diet Carb Modified Fluid  consistency: Thin; Room service appropriate? Yes  Diet effective now          EDUCATION NEEDS:   Education needs have been addressed  Skin:  Skin Assessment: Reviewed RN Assessment  Last BM:  07/06/17  Height:   Ht Readings from Last 1 Encounters:  07/06/17 5\' 3"  (1.6 m)    Weight:   Wt Readings from Last 1 Encounters:  07/06/17 78 lb 4.2 oz (35.5 kg)   UBW: 115 lbs (per RD note 02/01/16) %UBW: 68%   Ideal Body Weight:  52.27 kg  BMI:  Body mass index is 13.86 kg/m.  Estimated Nutritional Needs:   Kcal:  1500-1700 kcal  Protein:  75-90 grams  Fluid:  1.5-1.7 L     Hope Budds, Dietetic Intern

## 2017-07-08 NOTE — Progress Notes (Signed)
Hypoglycemic Event  CBG: 34  Treatment: D50 IV 50 mL  Symptoms: Sweaty and Shaky  Follow-up CBG: Time:0755 CBG Result: pending  Possible Reasons for Event: Other: More frequent glucose     Brittany Mercado

## 2017-07-08 NOTE — Progress Notes (Addendum)
ANTICOAGULATION CONSULT NOTE - Initial Consult  Pharmacy Consult for Heparin Indication: PE  Allergies  Allergen Reactions  . Penicillins Swelling    SWELLING REACTION UNSPECIFIED  Has patient had a PCN reaction causing immediate rash, facial/tongue/throat swelling, SOB or lightheadedness with hypotension: Yes Has patient had a PCN reaction causing severe rash involving mucus membranes or skin necrosis: No Has patient had a PCN reaction that required hospitalization No Has patient had a PCN reaction occurring within the last 10 years: No If all of the above answers are "NO", then may proceed with Cephalosporin use.    Patient Measurements: Height: 5\' 3"  (160 cm) Weight: 78 lb 4.2 oz (35.5 kg) IBW/kg (Calculated) : 52.4  Vital Signs: Temp: 97.7 F (36.5 C) (05/13 0800) Temp Source: Oral (05/13 0800) BP: 115/48 (05/13 0448) Pulse Rate: 79 (05/13 0448)  Labs: Recent Labs    07/06/17 1312  07/07/17 0618 07/07/17 0709  07/07/17 1109 07/07/17 1342 07/07/17 1758 07/07/17 2129  HGB 9.5*  --   --   --   --   --   --   --   --   HCT 26.0*  --   --   --   --   --   --   --   --   PLT 319  --   --   --   --   --   --   --   --   LABPROT  --   --   --  13.9  --   --   --   --   --   INR  --   --   --  1.08  --   --   --   --   --   CREATININE 1.52*   < > 1.09*  --    < >  --  1.04* 0.96 0.81  TROPONINI <0.03  --  0.11*  --   --  0.09*  --  0.07*  --    < > = values in this interval not displayed.    Estimated Creatinine Clearance: 38.3 mL/min (by C-G formula based on SCr of 0.81 mg/dL).   Medical History: Past Medical History:  Diagnosis Date  . Adenocarcinoma of right lung, stage 4 ([place]) 11/30/2015  . Adult failure to thrive   . Complication of anesthesia    hard to wake up  . Diabetes mellitus type 2 in nonobese (HCC)   . Eczema   . Encounter for antineoplastic chemotherapy 11/30/2015  . Heart murmur    told at age 20 but never has had any problems  . History of  radiation therapy 01/26/16-02/07/16   right pelvis 30 Gy in 10 fractions  . HTN (hypertension) 02/01/2016  . 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 Iron deficiency anemia due to chronic blood loss 12/10/2016    Medications:  Infusions:  . dextrose 5 % and 0.45% NaCl      Assessment: 67 yo F with hx Lung CA admitted with DKA.  VQ scan with intermediate probability for PE.  She is on Megace PTA which has been stopped.   Received Lovenox 1mg /kg x1 yesterday @ 0600.  Pharmacy now asked to start IV heparin.  Baseline CBC reviewed- Hg low but stable (9.5), Pltc WNL.  No acute bleeding noted.  Renal function has returned to baseline with hydration.   Goal of Therapy:  Heparin level 0.3-0.7 units/ml Monitor platelets by anticoagulation protocol: Yes   Plan:  Give 2000 units  bolus x 1 Start heparin infusion at 600 units/hr Check anti-Xa level in 6 hours and daily while on heparin Continue to monitor H&H and platelets  F/U long-term AC plans  Biagio Borg 07/08/2017,8:25 AM  Addendum: Initial heparin level subtherapeutic (0.21) on 600 units/hr. No bleeding or infusion issues reported by RN.  Increase heparin 700 units/hr. Re-check 6hr heparin level.  Netta Cedars, PharmD, BCPS Pager: [phone] 07/08/2017@3 :27 PM

## 2017-07-08 NOTE — Progress Notes (Signed)
Preliminary notes--Bilateral lower extremities venous duplex exam completed.  One of the Left peroneal veins thrombosed.  Other veins are WNL.    Multiple lymph nodes noted bilaterally.   [place]ying Abelina Ketron (RDMS RVT) 07/08/17 3:23 PM

## 2017-07-08 NOTE — Progress Notes (Signed)
ANTICOAGULATION CONSULT NOTE - f/u Consult  Pharmacy Consult for Heparin Indication: PE  Allergies  Allergen Reactions  . Penicillins Swelling    SWELLING REACTION UNSPECIFIED  Has patient had a PCN reaction causing immediate rash, facial/tongue/throat swelling, SOB or lightheadedness with hypotension: Yes Has patient had a PCN reaction causing severe rash involving mucus membranes or skin necrosis: No Has patient had a PCN reaction that required hospitalization No Has patient had a PCN reaction occurring within the last 10 years: No If all of the above answers are "NO", then may proceed with Cephalosporin use.    Patient Measurements: Height: 5\' 3"  (160 cm) Weight: 78 lb 4.2 oz (35.5 kg) IBW/kg (Calculated) : 52.4  Vital Signs: Temp: 99 F (37.2 C) (05/13 2000) Temp Source: Oral (05/13 2000) BP: 115/41 (05/13 1951) Pulse Rate: 103 (05/13 2000)  Labs: Recent Labs    07/06/17 1312  07/07/17 0618 07/07/17 0702 07/07/17 0709  07/07/17 1109 07/07/17 1342 07/07/17 1758 07/07/17 2129 07/08/17 1435 07/08/17 2230  HGB 9.5*  --   --   --   --   --   --   --   --   --   --   --   HCT 26.0*  --   --  27.0*  --   --   --   --   --   --   --   --   PLT 319  --   --   --   --   --   --   --   --   --   --   --   LABPROT  --   --   --   --  13.9  --   --   --   --   --   --   --   INR  --   --   --   --  1.08  --   --   --   --   --   --   --   HEPARINUNFRC  --   --   --   --   --   --   --   --   --   --  0.21* 0.20*  CREATININE 1.52*   < > 1.09*  --   --    < >  --  1.04* 0.96 0.81  --   --   TROPONINI <0.03  --  0.11*  --   --   --  0.09*  --  0.07*  --   --   --    < > = values in this interval not displayed.    Estimated Creatinine Clearance: 38.3 mL/min (by C-G formula based on SCr of 0.81 mg/dL).   Medical History: Past Medical History:  Diagnosis Date  . Adenocarcinoma of right lung, stage 4 ([place]) 11/30/2015  . Adult failure to thrive   . Complication of anesthesia     hard to wake up  . Diabetes mellitus type 2 in nonobese ([facility])   . Eczema   . Encounter for antineoplastic chemotherapy 11/30/2015  . Heart murmur    told at age 82 but never has had any problems  . History of radiation therapy 01/26/16-02/07/16   right pelvis 30 Gy in 10 fractions  . HTN (hypertension) 02/01/2016  . 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 Iron deficiency anemia due to chronic blood loss 12/10/2016    Medications:  Infusions:  . heparin  Assessment: 67 yo F with hx Lung CA admitted with DKA.  VQ scan with intermediate probability for PE.  She is on Megace PTA which has been stopped.   Received Lovenox 1mg /kg x1 yesterday @ 0600.  Pharmacy now asked to start IV heparin.  Baseline CBC reviewed- Hg low but stable (9.5), Pltc WNL.  No acute bleeding noted.  Renal function has returned to baseline with hydration.  Today, 5/13 2230 HL=0.20 below goal, no infusion or bleeding issues per RN.   Goal of Therapy:  Heparin level 0.3-0.7 units/ml Monitor platelets by anticoagulation protocol: Yes   Plan:   Rebolus with 1000 units IV heparin now Increase heparin drip to 850 units/hr Daily CBC/HL Check HL in 6 hours F/U long-term AC plans  Dorrene German 07/08/2017,11:34 PM

## 2017-07-08 NOTE — Progress Notes (Signed)
Patient complains of rt. sided chest pain(9/10) this morning.Notified MD. Morphine 1 mg IV once ordered.  Patient CBG this am was 34.Pt. was alert & oriented x4.  4 oz of Juice given. 50 ml of IV D50 given. Repeat CBG =12. Another 50 ml of D50 given. Repeat CBG=115.  Will continue to monitor.

## 2017-07-08 NOTE — Progress Notes (Signed)
PROGRESS NOTE    Brittany Mercado  QMG:[id number] DOB: 01/28/1951 DOA: 07/06/2017 PCP: [name], DO    Brief Narrative: 67 y.o. female,w stage 4 lung cancer, dm2, apparently on insulin at home presents with c/o nausea, and not feeling well.  Pt states that her sugars were high and therefore presented to the ED.  Assessment & Plan:   Principal Problem:   DKA (diabetic ketoacidoses) ([place]) - resolved after glucose drip - Pt transitioned to SQ insulin  Hypoglycemia - Will decrease Lantus dose by half. Continue SSI - diabetic diet  PE - Patient has intermediate risk per evaluation - will obtain lower extremity dopplers to assess for source - unsure of diagnosis as such will obtain CT angiogram given improvement in renal function - Place on pain medication as patient complaining of left chest discomfort.  - Also will cover with heparin while diagnosis is confirmed.  Active Problems:   Hypokalemia - Administer K via IV replacement - was given oral Kdur yesterday. - reassess later today - assess magnesium levels    Anemia   Renal insufficiency   Shortness of breath   DVT prophylaxis: heparin Code Status: Full Family Communication: None at bedside Disposition Plan: pending improvement in condition   Consultants:   none   Procedures: none   Antimicrobials: none   Subjective: Pt has no new complaints. No acute issues overnight.  Objective: Vitals:   07/08/17 0448 07/08/17 0500 07/08/17 0600 07/08/17 0800  BP: (!) 115/48     Pulse: 79     Resp: 15 18 (!) 24   Temp:    97.7 F (36.5 C)  TempSrc:    Oral  SpO2:      Weight:      Height:        Intake/Output Summary (Last 24 hours) at 07/08/2017 1003 Last data filed at 07/08/2017 0300 Gross per 24 hour  Intake 1064.9 ml  Output 375 ml  Net 689.9 ml   Filed Weights   07/06/17 1216 07/06/17 2053  Weight: 37.2 kg (82 lb) 35.5 kg (78 lb 4.2 oz)    Examination:  General exam: Appears calm  and comfortable, in nad.  Respiratory system: no increased wob, no rhales, no wheezes, equal chest rise. Cardiovascular system: S1 & S2 heard, RRR. No JVD, murmurs, rubs, gallops or clicks. No pedal edema. Gastrointestinal system: Abdomen is nondistended, soft and nontender. No organomegaly or masses felt. Normal bowel sounds heard. Central nervous system: Alert and oriented. No focal neurological deficits. Extremities: Symmetric 5 x 5 power. Skin: No rashes, lesions or ulcers, on limited exam. Psychiatry: Mood & affect appropriate.   Data Reviewed: I have personally reviewed following labs and imaging studies  CBC: Recent Labs  Lab 07/06/17 1312  WBC 13.2*  NEUTROABS 9.9*  HGB 9.5*  HCT 26.0*  MCV 80.0  PLT 891   Basic Metabolic Panel: Recent Labs  Lab 07/07/17 0618 07/07/17 0924 07/07/17 1342 07/07/17 1758 07/07/17 2129  NA 144 144 142 140 144  K 4.0 4.4 3.4* 2.8* 2.7*  CL 110 113* 109 108 111  CO2 22 14* 23 21* 21*  GLUCOSE 127* 189* 284* 129* 52*  BUN 18 17 15 13 11   CREATININE 1.09* 1.10* 1.04* 0.96 0.81  CALCIUM 7.8* 7.7* 7.8* 7.9* 8.1*  MG  --   --   --   --  1.1*   GFR: Estimated Creatinine Clearance: 38.3 mL/min (by C-G formula based on SCr of 0.81 mg/dL). Liver Function Tests: Recent  Labs  Lab 07/06/17 1312 07/07/17 0618  AST 387* 234*  ALT 283* 213*  ALKPHOS 529* 423*  BILITOT 1.8* 0.7  PROT 7.7 6.1*  ALBUMIN 3.1* 2.5*   No results for input(s): LIPASE, AMYLASE in the last 168 hours. No results for input(s): AMMONIA in the last 168 hours. Coagulation Profile: Recent Labs  Lab 07/07/17 0709  INR 1.08   Cardiac Enzymes: Recent Labs  Lab 07/06/17 1312 07/07/17 0618 07/07/17 1109 07/07/17 1758  TROPONINI <0.03 0.11* 0.09* 0.07*   BNP (last 3 results) No results for input(s): PROBNP in the last 8760 hours. HbA1C: No results for input(s): HGBA1C in the last 72 hours. CBG: Recent Labs  Lab 07/07/17 1707 07/07/17 2124 07/08/17 0731  07/08/17 0754 07/08/17 0816  GLUCAP 124* 84 30* 12* 152*   Lipid Profile: No results for input(s): CHOL, HDL, LDLCALC, TRIG, CHOLHDL, LDLDIRECT in the last 72 hours. Thyroid Function Tests: No results for input(s): TSH, T4TOTAL, FREET4, T3FREE, THYROIDAB in the last 72 hours. Anemia Panel: Recent Labs    07/07/17 0618  VITAMINB12 4,590*  FERRITIN 89  TIBC 239*  IRON 44   Sepsis Labs: No results for input(s): PROCALCITON, LATICACIDVEN in the last 168 hours.  Recent Results (from the past 240 hour(s))  Culture, blood (routine x 2)     Status: None (Preliminary result)   Collection Time: 07/06/17  6:40 PM  Result Value Ref Range Status   Specimen Description   Final    BLOOD RIGHT ANTECUBITAL Performed at [facility], [address] [place], [place] [address]    Special Requests   Final    BOTTLES DRAWN AEROBIC AND ANAEROBIC Blood Culture adequate volume Performed at [facility], [address] [place], [place] [address]    Culture   Final    NO GROWTH < 24 HOURS Performed at [facility], [place] [address] [place], [place] [address]    Report Status PENDING  Incomplete  MRSA PCR Screening     Status: None   Collection Time: 07/06/17  8:48 PM  Result Value Ref Range Status   MRSA by PCR NEGATIVE NEGATIVE Final    Comment:        The GeneXpert MRSA Assay (FDA approved for NASAL specimens only), is one component of a comprehensive MRSA colonization surveillance program. It is not intended to diagnose MRSA infection nor to guide or monitor treatment for MRSA infections. Performed at [facility], [place] [address], [place], [place] [address]          Radiology Studies: Dg Chest 2 View  Result Date: 07/06/2017 CLINICAL DATA:  Severe chest pain, heart racing for while but worse this morning, awoke patient from sleep, RIGHT shoulder and chest pain, stage IV lung cancer on chemotherapy, tachypnea, hypertension, type II  diabetes mellitus, former smoker EXAM: CHEST - 2 VIEW COMPARISON:  05/23/2017; correlation interval CT chest 06/10/2017 FINDINGS: Borderline enlargement of cardiac silhouette. Atherosclerotic calcification aorta. Mediastinal contours and pulmonary vascularity normal. Emphysematous changes consistent with COPD. No definite acute infiltrate, pleural effusion or pneumothorax. Bones demineralized. IMPRESSION: COPD changes without acute abnormality. Electronically Signed   By: Lavonia Dana M.D.   On: 07/06/2017 13:31   Nm Pulmonary Perf And Vent  Result Date: 07/07/2017 CLINICAL DATA:  Chest pain, shortness of breath, tachycardia, abnormal renal function, history lung cancer, hypertension, type II diabetes mellitus, former smoker EXAM: NUCLEAR MEDICINE VENTILATION - PERFUSION LUNG SCAN TECHNIQUE: Ventilation images were  obtained in multiple projections using inhaled aerosol Tc-47m DTPA. Perfusion images were obtained in multiple projections after intravenous injection of Tc-31m-MAA. RADIOPHARMACEUTICALS:  28 mCi of Tc-35m DTPA aerosol inhalation and 4 mCi Tc40m-MAA IV COMPARISON:  None Correlation: Chest radiograph 07/06/2017, CT chest 05/21/2017 FINDINGS: Ventilation: Central airway deposition of aerosol at RIGHT hilum. Absent ventilation to RIGHT lower lobe. Segmental/subsegmental diminished ventilation laterally in RIGHT upper lobe. No additional ventilatory defects. Perfusion: Present though diminished perfusion in the LEFT lower lobe, better than ventilation. Matching diminished perfusion laterally in RIGHT upper lobe. Nonsegmental irregular perfusion in lateral LEFT upper lobe. Chest radiograph: Upper normal heart size with question pulmonary vascular congestion, emphysematous changes, and RIGHT hilar/perihilar mass. IMPRESSION: Matching ventilation and perfusion defect at lateral RIGHT upper lobe corresponding to site of significant emphysematous changes by CT. Absent ventilation and diminished perfusion in the  RIGHT lower lobe in a patient with a known RIGHT hilar/perihilar mass. No other segmental or subsegmental perfusion defects identified. Findings represent an intermediate probability for pulmonary embolism. Electronically Signed   By: Lavonia Dana M.D.   On: 07/07/2017 12:48   Dg Chest Portable 1 View  Result Date: 07/06/2017 CLINICAL DATA:  Acute shortness of breath. History of stage IV lung cancer. EXAM: PORTABLE CHEST 1 VIEW COMPARISON:  07/06/2017 chest radiograph, 06/10/2017 CT and prior studies FINDINGS: RIGHT hilar/perihilar mass again noted. UPPER limits normal heart size identified. Apparent increase in pulmonary vascular congestion noted. No pleural effusion or pneumothorax noted. No acute bony abnormalities are present. IMPRESSION: Question pulmonary vascular congestion. Otherwise unchanged appearance of the chest with RIGHT hilar/perihilar mass. Electronically Signed   By: Margarette Canada M.D.   On: 07/06/2017 19:14        Scheduled Meds: . aspirin EC  81 mg Oral Daily  . clopidogrel  75 mg Oral Q breakfast  . feeding supplement (PRO-STAT SUGAR FREE 64)  30 mL Oral BID  . ferrous sulfate  325 mg Oral Daily  . insulin aspart  0-15 Units Subcutaneous TID WC  . insulin aspart  0-5 Units Subcutaneous QHS  . insulin glargine  5 Units Subcutaneous Daily  . levothyroxine  100 mcg Oral QAC breakfast  . loratadine  10 mg Oral Daily  . mouth rinse  15 mL Mouth Rinse BID  . potassium chloride  40 mEq Oral BID   Continuous Infusions: . heparin 600 Units/hr (07/08/17 0850)  . potassium chloride       LOS: 2 days    Time spent: > 35 minutes    Velvet Bathe, MD [facility] Pager [phone]  If 7PM-7AM, please contact night-coverage [email or web address] Password [email or web address] 07/08/2017, 10:03 AM

## 2017-07-09 ENCOUNTER — Telehealth: Payer: Self-pay | Admitting: *Deleted

## 2017-07-09 LAB — CBC
HEMATOCRIT: 31.6 % — AB (ref 36.0–46.0)
HEMOGLOBIN: 10.8 g/dL — AB (ref 12.0–15.0)
MCH: 27.6 pg (ref 26.0–34.0)
MCHC: 34.2 g/dL (ref 30.0–36.0)
MCV: 80.8 fL (ref 78.0–100.0)
Platelets: 275 10*3/uL (ref 150–400)
RBC: 3.91 MIL/uL (ref 3.87–5.11)
RDW: 16 % — ABNORMAL HIGH (ref 11.5–15.5)
WBC: 8 10*3/uL (ref 4.0–10.5)

## 2017-07-09 LAB — GLUCOSE, CAPILLARY
GLUCOSE-CAPILLARY: 248 mg/dL — AB (ref 65–99)
GLUCOSE-CAPILLARY: 326 mg/dL — AB (ref 65–99)
GLUCOSE-CAPILLARY: 126 mg/dL — AB (ref 65–99)
GLUCOSE-CAPILLARY: 124 mg/dL — AB (ref 65–99)
Glucose-Capillary: 171 mg/dL — ABNORMAL HIGH (ref 65–99)
Glucose-Capillary: 138 mg/dL — ABNORMAL HIGH (ref 65–99)

## 2017-07-09 LAB — HEPARIN LEVEL (UNFRACTIONATED): Heparin Unfractionated: 0.6 IU/mL (ref 0.30–0.70)

## 2017-07-09 MED ORDER — LEVOFLOXACIN 750 MG PO TABS
750.0000 mg | ORAL_TABLET | ORAL | Status: AC
  Administered 2017-07-09 – 2017-07-15 (×4): 750 mg via ORAL
  Filled 2017-07-09 (×6): qty 1

## 2017-07-09 MED ORDER — HEPARIN SODIUM (PORCINE) 5000 UNIT/ML IJ SOLN
5000.0000 [IU] | Freq: Three times a day (TID) | INTRAMUSCULAR | Status: DC
Start: 2017-07-09 — End: 2017-07-12
  Administered 2017-07-09 – 2017-07-12 (×8): 5000 [IU] via SUBCUTANEOUS
  Filled 2017-07-09 (×8): qty 1

## 2017-07-09 NOTE — Progress Notes (Signed)
Pharmacy Antibiotic Note  Brittany Mercado is a 67 y.o. female admitted on 07/06/2017 with pneumonia.  Pharmacy has been consulted for levaquin dosing.  PNA most likely secondary to postobstructive pneumonia reported on CT angiogram. WBC WNL, AF. Wt 35.5 kg; CrCL 38 ml/min.    Plan: levaquin 750 mg po q48 hrs Pharmacy to sign off  Height: 5\' 3"  (160 cm) Weight: 78 lb 4.2 oz (35.5 kg) IBW/kg (Calculated) : 52.4  Temp (24hrs), Avg:98.5 F (36.9 C), Min:97.2 F (36.2 C), Max:99.9 F (37.7 C)  Recent Labs  Lab 07/06/17 1312  07/07/17 0618 07/07/17 0924 07/07/17 1342 07/07/17 1758 07/07/17 2129 07/09/17 0334  WBC 13.2*  --   --   --   --   --   --  8.0  CREATININE 1.52*   < > 1.09* 1.10* 1.04* 0.96 0.81  --    < > = values in this interval not displayed.    Estimated Creatinine Clearance: 38.3 mL/min (by C-G formula based on SCr of 0.81 mg/dL).    Allergies  Allergen Reactions  . Penicillins Swelling    SWELLING REACTION UNSPECIFIED  Has patient had a PCN reaction causing immediate rash, facial/tongue/throat swelling, SOB or lightheadedness with hypotension: Yes Has patient had a PCN reaction causing severe rash involving mucus membranes or skin necrosis: No Has patient had a PCN reaction that required hospitalization No Has patient had a PCN reaction occurring within the last 10 years: No If all of the above answers are "NO", then may proceed with Cephalosporin use.    Thank you for allowing pharmacy to be a part of this patient's care.  Eudelia Bunch, Pharm.D. [phone] 07/09/2017 10:04 AM

## 2017-07-09 NOTE — Progress Notes (Signed)
PROGRESS NOTE    Brittany Mercado  MAU:[id number] DOB: 1950-08-16 DOA: 07/06/2017 PCP: [name], DO    Brief Narrative: 67 y.o. female,w stage 4 lung cancer, dm2, apparently on insulin at home presents with c/o nausea, and not feeling well. Pt presented with DKA and PNA or pneumonitis.   Initial concern for PE but CT angiogram ruled out.  Assessment & Plan:   Principal Problem:   DKA (diabetic ketoacidoses) ([place]) - Resolved after glucose drip. - Pt transitioned to SQ insulin.  Hypoglycemia -Patient was placed on home Lantus regimen with sliding scale insulin and developed hypoglycemia.  This has resolved after cutting home Lantus regimen in half. -Continue diabetic diet.  we will continue to monitor blood sugars and adjust hypoglycemic agents accordingly  PE - Patient has intermediate risk per evaluation nuc med, given improvement in serum creatinine CT angiogram was obtained which reported negative PE.  Pneumonia/pneumonitis -Start patient on Levaquin (pharmacy to dose).  Most likely secondary to postobstructive pneumonia reported on CT angiogram.  Active Problems:   Hypokalemia - Administer K via IV replacement - was given oral Kdur yesterday. - reassess later today - assess magnesium levels    Anemia - Stable no active bleeding reported.    Renal insufficiency - resolved with rehydration   DVT prophylaxis: heparin Code Status: Full Family Communication: None at bedside, updated patient. Disposition Plan: transfer to floor   Consultants:   none   Procedures: none   Antimicrobials: Levaquin   Subjective: Pt feels better than when she first came in.  Objective: Vitals:   07/09/17 0500 07/09/17 0600 07/09/17 0759 07/09/17 0800  BP:  (!) 92/47  (!) 106/48  Pulse: 95 97  100  Resp: 17 17 17 17   Temp:    99.1 F (37.3 C)  TempSrc:    Oral  SpO2: 98% 97%  100%  Weight:      Height:        Intake/Output Summary (Last 24 hours) at  07/09/2017 0946 Last data filed at 07/09/2017 0900 Gross per 24 hour  Intake 821.44 ml  Output 1100 ml  Net -278.56 ml   Filed Weights   07/06/17 1216 07/06/17 2053  Weight: 37.2 kg (82 lb) 35.5 kg (78 lb 4.2 oz)    Examination:  General exam: Appears calm and comfortable, in nad.  Respiratory system: no increased wob, no rhales, no wheezes, equal chest rise. Cardiovascular system: S1 & S2 heard, RRR. No JVD, murmurs, rubs, gallops or clicks. No pedal edema. Gastrointestinal system: Abdomen is nondistended, soft and nontender. No organomegaly or masses felt. Normal bowel sounds heard. Central nervous system: Alert and oriented. No focal neurological deficits. Extremities: Symmetric 5 x 5 power. Skin: No rashes, lesions or ulcers, on limited exam. Psychiatry: Mood & affect appropriate.   Data Reviewed: I have personally reviewed following labs and imaging studies  CBC: Recent Labs  Lab 07/06/17 1312 07/07/17 0702 07/09/17 0334  WBC 13.2*  --  8.0  NEUTROABS 9.9*  --   --   HGB 9.5*  --  10.8*  HCT 26.0* 27.0* 31.6*  MCV 80.0  --  80.8  PLT 319  --  563   Basic Metabolic Panel: Recent Labs  Lab 07/07/17 0618 07/07/17 0924 07/07/17 1342 07/07/17 1758 07/07/17 2129 07/08/17 1435  NA 144 144 142 140 144  --   K 4.0 4.4 3.4* 2.8* 2.7* 4.6  CL 110 113* 109 108 111  --   CO2 22 14* 23 21*  21*  --   GLUCOSE 127* 189* 284* 129* 52*  --   BUN 18 17 15 13 11   --   CREATININE 1.09* 1.10* 1.04* 0.96 0.81  --   CALCIUM 7.8* 7.7* 7.8* 7.9* 8.1*  --   MG  --   --   --   --  1.1*  --    GFR: Estimated Creatinine Clearance: 38.3 mL/min (by C-G formula based on SCr of 0.81 mg/dL). Liver Function Tests: Recent Labs  Lab 07/06/17 1312 07/07/17 0618  AST 387* 234*  ALT 283* 213*  ALKPHOS 529* 423*  BILITOT 1.8* 0.7  PROT 7.7 6.1*  ALBUMIN 3.1* 2.5*   No results for input(s): LIPASE, AMYLASE in the last 168 hours. No results for input(s): AMMONIA in the last 168  hours. Coagulation Profile: Recent Labs  Lab 07/07/17 0709  INR 1.08   Cardiac Enzymes: Recent Labs  Lab 07/06/17 1312 07/07/17 0618 07/07/17 1109 07/07/17 1758  TROPONINI <0.03 0.11* 0.09* 0.07*   BNP (last 3 results) No results for input(s): PROBNP in the last 8760 hours. HbA1C: No results for input(s): HGBA1C in the last 72 hours. CBG: Recent Labs  Lab 07/08/17 0816 07/08/17 1201 07/08/17 1733 07/08/17 2106 07/09/17 0715  GLUCAP 152* 283* 317* 122* 248*   Lipid Profile: No results for input(s): CHOL, HDL, LDLCALC, TRIG, CHOLHDL, LDLDIRECT in the last 72 hours. Thyroid Function Tests: No results for input(s): TSH, T4TOTAL, FREET4, T3FREE, THYROIDAB in the last 72 hours. Anemia Panel: Recent Labs    07/07/17 0618  VITAMINB12 4,590*  FERRITIN 89  TIBC 239*  IRON 44   Sepsis Labs: No results for input(s): PROCALCITON, LATICACIDVEN in the last 168 hours.  Recent Results (from the past 240 hour(s))  Culture, blood (routine x 2)     Status: None (Preliminary result)   Collection Time: 07/06/17  6:40 PM  Result Value Ref Range Status   Specimen Description   Final    BLOOD RIGHT ANTECUBITAL Performed at [facility], [address] [place], [place] [address]    Special Requests   Final    BOTTLES DRAWN AEROBIC AND ANAEROBIC Blood Culture adequate volume Performed at [facility], [address] [place], [place] [address]    Culture   Final    NO GROWTH 2 DAYS Performed at [facility], [place] [address], [place], [place] [address]    Report Status PENDING  Incomplete  MRSA PCR Screening     Status: None   Collection Time: 07/06/17  8:48 PM  Result Value Ref Range Status   MRSA by PCR NEGATIVE NEGATIVE Final    Comment:        The GeneXpert MRSA Assay (FDA approved for NASAL specimens only), is one component of a comprehensive MRSA colonization surveillance program. It is not intended to diagnose MRSA infection nor to  guide or monitor treatment for MRSA infections. Performed at [facility], [place] [address], [place], [place] [address]          Radiology Studies: Ct Angio Chest Pe W Or Wo Contrast  Result Date: 07/08/2017 CLINICAL DATA:  Chest discomfort.  Possible pulmonary embolus. EXAM: CT ANGIOGRAPHY CHEST WITH CONTRAST TECHNIQUE: Multidetector CT imaging of the chest was performed using the standard protocol during bolus administration of intravenous contrast. Multiplanar CT image reconstructions and MIPs were obtained to evaluate the vascular anatomy. CONTRAST:  75mL ISOVUE-370 IOPAMIDOL (ISOVUE-370) INJECTION 76% COMPARISON:  06/10/2017. FINDINGS:  Cardiovascular: Negative for pulmonary embolus. Atherosclerotic calcification of the arterial vasculature, including coronary arteries. Pulmonary arteries and heart are enlarged. New moderate pericardial effusion. Mediastinum/Nodes: Low internal jugular lymph nodes on the left measure up to 8 mm. No pathologically enlarged mediastinal lymph nodes. Right infrahilar mass measures approximately 3.3 x 3.6 cm and markedly narrows the right superior pulmonary vein. There is encasement and mild narrowing of the right middle and right lower lobe pulmonary arteries. Findings are similar to 06/10/2017. No left hilar adenopathy. Subpectoral and axillary lymph nodes measure up to 7 mm on the left. Esophagus is grossly unremarkable. Lungs/Pleura: Severe centrilobular emphysema with bullous changes in the right apex. New collapse/consolidation in the right lower lobe obscures a previously measured nodule in the superior segment right lower lobe. Small right pleural effusion is new and is partially loculated in the upper right hemithorax. There is obstruction of the right lower lobe bronchus by the right infrahilar mass, which is new from 06/10/2017. Associated fluid-filled bronchi in the right lower lobe. Upper Abdomen: Visualized portion of the liver is  unremarkable. Irregular right adrenal gland mass measures 2.4 x 3.8 cm, previously 2.1 x 3.4 cm. Visualized portions of the left adrenal gland and kidneys are unremarkable. Previously measured ill-defined low-attenuation lesion in the spleen is not readily visualized, possibly due to differences in bolus timing. Visualized portions of the pancreas, stomach and bowel are grossly unremarkable. High aortocaval lymph node measures 10 mm, similar. Musculoskeletal: No worrisome lytic or sclerotic lesions. Degenerative changes in the spine. Review of the MIP images confirms the above findings. IMPRESSION: 1. Negative for pulmonary embolus. 2. Right infrahilar mass is grossly stable in size but with new obstruction of the right lower lobe bronchus and postobstructive pneumonitis/pneumonia in the right lower lobe. Previously measured nodule in the superior segment right lower lobe is now obscured. 3. Enlarging right adrenal metastasis. Previously seen splenic lesion is not well visualized today, possibly due to bolus timing. 4. New small right pleural effusion. 5. New moderate pericardial effusion. 6. Enlarged pulmonary arteries, indicative of pulmonary arterial hypertension. 7.  Aortic atherosclerosis (ICD10-170.0). 8.  Emphysema (ICD10-J43.9). Electronically Signed   By: Lorin Picket M.D.   On: 07/08/2017 12:00   Nm Pulmonary Perf And Vent  Result Date: 07/07/2017 CLINICAL DATA:  Chest pain, shortness of breath, tachycardia, abnormal renal function, history lung cancer, hypertension, type II diabetes mellitus, former smoker EXAM: NUCLEAR MEDICINE VENTILATION - PERFUSION LUNG SCAN TECHNIQUE: Ventilation images were obtained in multiple projections using inhaled aerosol Tc-3m DTPA. Perfusion images were obtained in multiple projections after intravenous injection of Tc-37m-MAA. RADIOPHARMACEUTICALS:  28 mCi of Tc-45m DTPA aerosol inhalation and 4 mCi Tc74m-MAA IV COMPARISON:  None Correlation: Chest radiograph  07/06/2017, CT chest 05/21/2017 FINDINGS: Ventilation: Central airway deposition of aerosol at RIGHT hilum. Absent ventilation to RIGHT lower lobe. Segmental/subsegmental diminished ventilation laterally in RIGHT upper lobe. No additional ventilatory defects. Perfusion: Present though diminished perfusion in the LEFT lower lobe, better than ventilation. Matching diminished perfusion laterally in RIGHT upper lobe. Nonsegmental irregular perfusion in lateral LEFT upper lobe. Chest radiograph: Upper normal heart size with question pulmonary vascular congestion, emphysematous changes, and RIGHT hilar/perihilar mass. IMPRESSION: Matching ventilation and perfusion defect at lateral RIGHT upper lobe corresponding to site of significant emphysematous changes by CT. Absent ventilation and diminished perfusion in the RIGHT lower lobe in a patient with a known RIGHT hilar/perihilar mass. No other segmental or subsegmental perfusion defects identified. Findings represent an intermediate probability for pulmonary embolism. Electronically Signed  By: Lavonia Dana M.D.   On: 07/07/2017 12:48        Scheduled Meds: . aspirin EC  81 mg Oral Daily  . clopidogrel  75 mg Oral Q breakfast  . feeding supplement (PRO-STAT SUGAR FREE 64)  30 mL Oral BID  . ferrous sulfate  325 mg Oral Daily  . heparin injection (subcutaneous)  5,000 Units Subcutaneous Q8H  . insulin aspart  0-15 Units Subcutaneous TID WC  . insulin aspart  0-5 Units Subcutaneous QHS  . insulin glargine  5 Units Subcutaneous Daily  . levothyroxine  100 mcg Oral QAC breakfast  . loratadine  10 mg Oral Daily  . mouth rinse  15 mL Mouth Rinse BID  . potassium chloride  40 mEq Oral BID   Continuous Infusions:    LOS: 3 days    Time spent:  35 minutes  Velvet Bathe, MD [facility] Pager [phone]  If 7PM-7AM, please contact night-coverage [email or web address] Password [facility] 07/09/2017, 9:46 AM

## 2017-07-09 NOTE — Telephone Encounter (Signed)
Received Physician Orders from [facility]; forwarded to provider/SLS 05/14  

## 2017-07-09 NOTE — Progress Notes (Addendum)
ANTICOAGULATION CONSULT NOTE - f/u Consult  Pharmacy Consult for Heparin Indication: PE  Allergies  Allergen Reactions  . Penicillins Swelling    SWELLING REACTION UNSPECIFIED  Has patient had a PCN reaction causing immediate rash, facial/tongue/throat swelling, SOB or lightheadedness with hypotension: Yes Has patient had a PCN reaction causing severe rash involving mucus membranes or skin necrosis: No Has patient had a PCN reaction that required hospitalization No Has patient had a PCN reaction occurring within the last 10 years: No If all of the above answers are "NO", then may proceed with Cephalosporin use.    Patient Measurements: Height: 5\' 3"  (160 cm) Weight: 78 lb 4.2 oz (35.5 kg) IBW/kg (Calculated) : 52.4  Vital Signs: Temp: 99.1 F (37.3 C) (05/14 0800) Temp Source: Oral (05/14 0800) BP: 106/48 (05/14 0800) Pulse Rate: 100 (05/14 0800)  Labs: Recent Labs    07/06/17 1312  07/07/17 0618 07/07/17 0702 07/07/17 0709  07/07/17 1109 07/07/17 1342 07/07/17 1758 07/07/17 2129 07/08/17 1435 07/08/17 2230 07/09/17 0334 07/09/17 0746  HGB 9.5*  --   --   --   --   --   --   --   --   --   --   --  10.8*  --   HCT 26.0*  --   --  27.0*  --   --   --   --   --   --   --   --  31.6*  --   PLT 319  --   --   --   --   --   --   --   --   --   --   --  275  --   LABPROT  --   --   --   --  13.9  --   --   --   --   --   --   --   --   --   INR  --   --   --   --  1.08  --   --   --   --   --   --   --   --   --   HEPARINUNFRC  --   --   --   --   --   --   --   --   --   --  0.21* 0.20*  --  0.60  CREATININE 1.52*   < > 1.09*  --   --    < >  --  1.04* 0.96 0.81  --   --   --   --   TROPONINI <0.03  --  0.11*  --   --   --  0.09*  --  0.07*  --   --   --   --   --    < > = values in this interval not displayed.    Estimated Creatinine Clearance: 38.3 mL/min (by C-G formula based on SCr of 0.81 mg/dL).   Assessment: 67 yo F with hx Lung CA admitted with DKA.  VQ  scan with intermediate probability for PE.  She is on Megace PTA which has been stopped.   Received Lovenox 1mg /kg x1 yesterday @ 0600.  Pharmacy now asked to start IV heparin.  Baseline CBC reviewed- Hg low but stable (9.5), Pltc WNL.  No acute bleeding noted.  Renal function has returned to baseline with hydration.   07/09/2017  Heparin level therapeutic at 0.6  after 1000 unit bolus and rate increase to 850 units/hr.  CBC stable, no bleeding reported.    Goal of Therapy:  Heparin level 0.3-0.7 units/ml Monitor platelets by anticoagulation protocol: Yes   Plan:  continue heparin drip at 850 units/hr Daily CBC/HL Recheck HL in 6 hours F/U long-term AC plans  Eudelia Bunch, Pharm.D. [phone] 07/09/2017 8:27 AM   Addendum: change to sq heparin for VTE px: pt 35 kg  Plan: heparin 5000 sq q8hr Pharmacy to sign off  Eudelia Bunch, Pharm.D. [phone] 07/09/2017 9:12 AM

## 2017-07-09 NOTE — Consult Note (Signed)
   [facility] CM Inpatient Consult   07/09/2017  Radiance Deady [birth year] [id number]    Patient is active with [facility]. She is followed by [facility] Education officer, museum for [employer].  Chart reviewed. Noted Ms. Meckley is currently on stepdown unit. Will notify inpatient RNCM that [facility] is following.  Will follow up with patient at later time.    Marthenia Rolling, MSN-Ed, RN,BSN [facility] Liaison [phone]

## 2017-07-10 ENCOUNTER — Inpatient Hospital Stay (HOSPITAL_COMMUNITY): Payer: Medicare Other

## 2017-07-10 DIAGNOSIS — J189 Pneumonia, unspecified organism: Secondary | ICD-10-CM

## 2017-07-10 DIAGNOSIS — I313 Pericardial effusion (noninflammatory): Secondary | ICD-10-CM

## 2017-07-10 DIAGNOSIS — E43 Unspecified severe protein-calorie malnutrition: Secondary | ICD-10-CM

## 2017-07-10 DIAGNOSIS — J181 Lobar pneumonia, unspecified organism: Secondary | ICD-10-CM

## 2017-07-10 LAB — COMPREHENSIVE METABOLIC PANEL
ALBUMIN: 2.4 g/dL — AB (ref 3.5–5.0)
ALK PHOS: 594 U/L — AB (ref 38–126)
ALT: 267 U/L — AB (ref 14–54)
AST: 517 U/L — AB (ref 15–41)
Anion gap: 17 — ABNORMAL HIGH (ref 5–15)
BILIRUBIN TOTAL: 1.9 mg/dL — AB (ref 0.3–1.2)
BUN: 25 mg/dL — AB (ref 6–20)
CO2: 14 mmol/L — ABNORMAL LOW (ref 22–32)
CREATININE: 1.28 mg/dL — AB (ref 0.44–1.00)
Calcium: 8.4 mg/dL — ABNORMAL LOW (ref 8.9–10.3)
Chloride: 106 mmol/L (ref 101–111)
GFR calc Af Amer: 49 mL/min — ABNORMAL LOW (ref >60)
GFR calc non Af Amer: 43 mL/min — ABNORMAL LOW (ref >60)
GLUCOSE: 336 mg/dL — AB (ref 65–99)
Potassium: 3.6 mmol/L (ref 3.5–5.1)
Sodium: 137 mmol/L (ref 135–145)
TOTAL PROTEIN: 6.5 g/dL (ref 6.5–8.1)

## 2017-07-10 LAB — BASIC METABOLIC PANEL
Anion gap: 8 (ref 5–15)
Anion gap: 9 (ref 5–15)
BUN: 17 mg/dL (ref 6–20)
BUN: 22 mg/dL — ABNORMAL HIGH (ref 6–20)
CALCIUM: 8.1 mg/dL — AB (ref 8.9–10.3)
CHLORIDE: 107 mmol/L (ref 101–111)
CO2: 20 mmol/L — AB (ref 22–32)
CO2: 20 mmol/L — ABNORMAL LOW (ref 22–32)
CREATININE: 1 mg/dL (ref 0.44–1.00)
Calcium: 8.1 mg/dL — ABNORMAL LOW (ref 8.9–10.3)
Chloride: 110 mmol/L (ref 101–111)
Creatinine, Ser: 0.91 mg/dL (ref 0.44–1.00)
GFR calc Af Amer: >60 mL/min (ref >60)
GFR calc Af Amer: >60 mL/min (ref >60)
GFR, EST NON AFRICAN AMERICAN: 57 mL/min — AB (ref >60)
GLUCOSE: 231 mg/dL — AB (ref 65–99)
Glucose, Bld: 267 mg/dL — ABNORMAL HIGH (ref 65–99)
POTASSIUM: 4.8 mmol/L (ref 3.5–5.1)
Potassium: 4.1 mmol/L (ref 3.5–5.1)
SODIUM: 136 mmol/L (ref 135–145)
SODIUM: 138 mmol/L (ref 135–145)

## 2017-07-10 LAB — GLUCOSE, CAPILLARY
GLUCOSE-CAPILLARY: 393 mg/dL — AB (ref 65–99)
Glucose-Capillary: 428 mg/dL — ABNORMAL HIGH (ref 65–99)
Glucose-Capillary: 359 mg/dL — ABNORMAL HIGH (ref 65–99)
Glucose-Capillary: 115 mg/dL — ABNORMAL HIGH (ref 65–99)
Glucose-Capillary: 268 mg/dL — ABNORMAL HIGH (ref 65–99)
Glucose-Capillary: 224 mg/dL — ABNORMAL HIGH (ref 65–99)
Glucose-Capillary: 223 mg/dL — ABNORMAL HIGH (ref 65–99)

## 2017-07-10 LAB — BLOOD GAS, ARTERIAL
ACID-BASE EXCESS: 0.7 mmol/L (ref 0.0–2.0)
Bicarbonate: 23.9 mmol/L (ref 20.0–28.0)
DRAWN BY: 257701
O2 SAT: 91.3 %
Patient temperature: 98.6
pCO2 arterial: 34.3 mmHg (ref 32.0–48.0)
pH, Arterial: 7.458 — ABNORMAL HIGH (ref 7.350–7.450)
pO2, Arterial: 60.7 mmHg — ABNORMAL LOW (ref 83.0–108.0)

## 2017-07-10 LAB — ECHOCARDIOGRAM LIMITED
Height: 63 in
Weight: 1252.21 oz

## 2017-07-10 LAB — GLUCOSE, RANDOM: Glucose, Bld: 452 mg/dL — ABNORMAL HIGH (ref 65–99)

## 2017-07-10 LAB — BETA-HYDROXYBUTYRIC ACID: Beta-Hydroxybutyric Acid: 0.35 mmol/L — ABNORMAL HIGH (ref 0.05–0.27)

## 2017-07-10 LAB — LACTIC ACID, PLASMA: Lactic Acid, Venous: 1.4 mmol/L (ref 0.5–1.9)

## 2017-07-10 LAB — MAGNESIUM: Magnesium: 1.3 mg/dL — ABNORMAL LOW (ref 1.7–2.4)

## 2017-07-10 LAB — TSH: TSH: 13.11 u[IU]/mL — ABNORMAL HIGH (ref 0.350–4.500)

## 2017-07-10 MED ORDER — SODIUM CHLORIDE 0.9 % IV SOLN
INTRAVENOUS | Status: AC
  Administered 2017-07-10: 19:00:00 via INTRAVENOUS

## 2017-07-10 MED ORDER — MEGESTROL ACETATE 625 MG/5ML PO SUSP: 625.0000 mg | Freq: Every day | ORAL | Status: DC

## 2017-07-10 MED ORDER — ADULT MULTIVITAMIN W/MINERALS CH
1.0000 | ORAL_TABLET | Freq: Every day | ORAL | Status: DC
  Administered 2017-07-10 – 2017-07-12 (×3): 1 via ORAL
  Filled 2017-07-10 (×3): qty 1

## 2017-07-10 MED ORDER — SODIUM CHLORIDE 0.9 % IV SOLN
INTRAVENOUS | Status: DC
  Administered 2017-07-10: 2.1 [IU]/h via INTRAVENOUS
  Filled 2017-07-10: qty 1

## 2017-07-10 MED ORDER — ENSURE ENLIVE PO LIQD
237.0000 mL | ORAL | Status: DC
  Administered 2017-07-10 – 2017-07-11 (×2): 237 mL via ORAL

## 2017-07-10 MED ORDER — SODIUM CHLORIDE 0.9 % IV SOLN
INTRAVENOUS | Status: DC
  Administered 2017-07-11: 17:00:00 via INTRAVENOUS

## 2017-07-10 MED ORDER — INSULIN GLARGINE 100 UNIT/ML ~~LOC~~ SOLN
7.0000 [IU] | Freq: Every day | SUBCUTANEOUS | Status: DC
  Administered 2017-07-11: 7 [IU] via SUBCUTANEOUS
  Filled 2017-07-10: qty 0.07

## 2017-07-10 MED ORDER — INSULIN GLARGINE 100 UNIT/ML ~~LOC~~ SOLN
2.0000 [IU] | Freq: Once | SUBCUTANEOUS | Status: AC
  Administered 2017-07-10: 2 [IU] via SUBCUTANEOUS
  Filled 2017-07-10: qty 0.02

## 2017-07-10 MED ORDER — INSULIN ASPART 100 UNIT/ML ~~LOC~~ SOLN
0.0000 [IU] | SUBCUTANEOUS | Status: DC
  Administered 2017-07-10: 15 [IU] via SUBCUTANEOUS

## 2017-07-10 MED ORDER — MEGESTROL ACETATE 400 MG/10ML PO SUSP
800.0000 mg | Freq: Every day | ORAL | Status: DC
  Administered 2017-07-10 – 2017-07-11 (×2): 800 mg via ORAL
  Filled 2017-07-10 (×3): qty 20

## 2017-07-10 MED ORDER — POTASSIUM CHLORIDE 10 MEQ/100ML IV SOLN
10.0000 meq | INTRAVENOUS | Status: AC
  Administered 2017-07-10 (×2): 10 meq via INTRAVENOUS
  Filled 2017-07-10 (×2): qty 100

## 2017-07-10 MED ORDER — MAGNESIUM SULFATE 4 GM/100ML IV SOLN
4.0000 g | Freq: Once | INTRAVENOUS | Status: AC
  Administered 2017-07-10: 4 g via INTRAVENOUS
  Filled 2017-07-10: qty 100

## 2017-07-10 MED ORDER — DEXTROSE-NACL 5-0.45 % IV SOLN
INTRAVENOUS | Status: DC
  Administered 2017-07-10: via INTRAVENOUS

## 2017-07-10 NOTE — Evaluation (Signed)
Physical Therapy Evaluation Patient Details Name: Brittany Mercado MRN: [id number] DOB: November 12, 1950 Today's Date: 07/10/2017   History of Present Illness  67 yo female admitted with chest pain, CKA, CT (+) pleural effusion, (-) PE. Hx of met lung ca, DM, unsteady gait, R superficial femoral artheretomy, angioplasty 05/2017.     Clinical Impression  On eval, pt required Min assist for mobility. She was barely able to stand to take a few lateral steps along side of bed with a walker. At start of session, vitals: 96% RA, HR 120 bpm. During session (activity), HR up to 140 bpm, O2 down to 80% on RA. End of session: HR 126 bpm, O2 97% on RA. Unsure of accuracy of O2 monitor, however pt was visibly fatigued and dyspneic. Pt presents with general weakness,decreased activity tolerance, and impaired gait and balance. Discussed d/c plan-pt stated she has family that can assist at home. She is currently planning to d/c home. Based on today's performance, may need to consider placement. Will follow and progress activity as pt is able to tolerate.     Follow Up Recommendations SNF (HHPT; 24 hour supervision/assist if pt refuses OR if pt improves significantly)    Equipment Recommendations  (continuing to assess)    Recommendations for Other Services OT consult     Precautions / Restrictions Precautions Precautions: Fall Precaution Comments: monitor vitals Restrictions Weight Bearing Restrictions: No      Mobility  Bed Mobility Overal bed mobility: Needs Assistance Bed Mobility: Supine to Sit;Sit to Supine     Supine to sit: Min assist;HOB elevated Sit to supine: Min assist;HOB elevated   General bed mobility comments: Assist for trunk and LEs. Increased time. visibly fatigued after sitting up.   Transfers Overall transfer level: Needs assistance Equipment used: Rolling walker (2 wheeled) Transfers: Sit to/from Stand Sit to Stand: Min assist         General transfer comment: Assist to  rise, stabilize, control descent. VCs hand placement. Easily fatigued with activity  Ambulation/Gait     Assistive device: Rolling walker (2 wheeled)       General Gait Details: side steps x 5 along side of bed with RW. HR up to 140 bpm, O2 dropped to 80% on RA. Deferred any further activity. Unable to safely attempt any ambulation based on cardiopulmonary status.  Stairs            Wheelchair Mobility    Modified Rankin (Stroke Patients Only)       Balance Overall balance assessment: Needs assistance         Standing balance support: Bilateral upper extremity supported Standing balance-Leahy Scale: Poor                               Pertinent Vitals/Pain Pain Assessment: No/denies pain    Home Living Family/patient expects to be discharged to:: Private residence Living Arrangements: Children Available Help at Discharge: Family;Available PRN/intermittently Type of Home: House       Home Layout: Two level Home Equipment: Walker - 2 wheels      Prior Function Level of Independence: Independent               Hand Dominance        Extremity/Trunk Assessment   Upper Extremity Assessment Upper Extremity Assessment: Generalized weakness    Lower Extremity Assessment Lower Extremity Assessment: Generalized weakness    Cervical / Trunk Assessment Cervical / Trunk Assessment: Kyphotic  Communication   Communication: No difficulties  Cognition Arousal/Alertness: Awake/alert Behavior During Therapy: WFL for tasks assessed/performed Overall Cognitive Status: Within Functional Limits for tasks assessed                                        General Comments      Exercises     Assessment/Plan    PT Assessment Patient needs continued PT services  PT Problem List Decreased strength;Decreased balance;Decreased mobility;Decreased activity tolerance;Decreased knowledge of use of DME;Cardiopulmonary status limiting  activity       PT Treatment Interventions DME instruction;Gait training;Functional mobility training;Therapeutic activities;Balance training;Patient/family education;Therapeutic exercise    PT Goals (Current goals can be found in the Care Plan section)  Acute Rehab PT Goals Patient Stated Goal: to feel better PT Goal Formulation: With patient Time For Goal Achievement: 07/24/17 Potential to Achieve Goals: Fair    Frequency Min 3X/week   Barriers to discharge        Co-evaluation               AM-PAC PT "6 Clicks" Daily Activity  Outcome Measure Difficulty turning over in bed (including adjusting bedclothes, sheets and blankets)?: A Lot Difficulty moving from lying on back to sitting on the side of the bed? : Unable Difficulty sitting down on and standing up from a chair with arms (e.g., wheelchair, bedside commode, etc,.)?: Unable Help needed moving to and from a bed to chair (including a wheelchair)?: A Lot Help needed walking in hospital room?: A Lot   6 Click Score: 8    End of Session   Activity Tolerance: Patient limited by fatigue Patient left: in bed;with call bell/phone within reach;with bed alarm set   PT Visit Diagnosis: Difficulty in walking, not elsewhere classified (R26.2);Muscle weakness (generalized) (M62.81)    Time: 9242-6834 PT Time Calculation (min) (ACUTE ONLY): 13 min   Charges:   PT Evaluation $PT Eval Moderate Complexity: 1 Mod     PT G Codes:          Weston Anna, MPT Pager: [phone]

## 2017-07-10 NOTE — Progress Notes (Signed)
  Echocardiogram 2D Echocardiogram Limited  has been performed.  Darlina Sicilian M 07/10/2017, 8:25 AM

## 2017-07-10 NOTE — Progress Notes (Addendum)
PROGRESS NOTE   Taiwan Millon  WJX:[id number]    DOB: 23-Jan-1951    DOA: 07/06/2017  PCP: [name], DO   I have briefly reviewed patients previous medical records in [facility].  Brief Narrative:  67 year old female, lives with family, ambulates with the help of a walker, not on home oxygen, PMH of stage IV non-small cell lung cancer/adenocarcinoma currently on IV Ketruda/immunotherapy every 3 weeks (Dr. [name], oncology), poorly controlled DM 2/IDDM with frequent episodes of DKA referred to OP endocrinology by oncology, HTN, hypothyroid presented to ED 07/06/2017 due to nausea and not feeling well and admitted for DKA.  DKA resolved.  Treating for postobstructive pneumonia.   Assessment & Plan:   Active Problems:   Diabetes mellitus type 2 in nonobese (HCC)   DKA (diabetic ketoacidoses) (HCC)   Hypokalemia   Anemia   Renal insufficiency   Shortness of breath   DKA and type II DM not at goal: A1c 05/09/2017: 8.8.  Labs on admission: Glucose 525, potassium 5.3, bicarbonate 8, creatinine 1.52 and anion gap >20).  Treated per DKA protocol and DKA resolved.  However CBG's with widely fluctuating blood sugars (126-428) in the context of poor oral intake.  Diabetes coordinator consulted, discussed with them and input appreciated.  Increase Lantus to 7 units daily and change SSI to moderate every 4 hours.  Holding metformin and Amaryl.  Monitor closely.  Acute kidney injury: Secondary to dehydration.  Metformin and meloxicam currently on hold.  Resolved.  Hyperkalemia/hypokalemia: Resolved.  Hypomagnesemia: Magnesium 1.1 on 5/12.  Repeat today and replace as needed.  Lobar postobstructive pneumonia (RLL): For tachycardia evaluation, patient underwent VQ scan which showed intermediate probability for PE.  Hence CTA chest obtained 07/08/2017: Negative for PE, stable right infrahilar mass with right lower lobe postobstructive pneumonia/pneumonitis.  Blood cultures negative to  date.  MRSA PCR negative.  Started on oral levofloxacin 750 mg every 48 hours from 5/14.  Severe protein calorie malnutrition: Multifactorial related to stage IV lung cancer and multiple severe significant comorbidities.  Dietitian consulted and discussed with them.  Resumed home dose of Megace.  Normocytic anemia/anemia of chronic disease versus iron deficiency: Stable.  Anemia panel results appreciated.  Follow CBCs periodically.  Elevated troponins: Flat trend.  No ischemic type of chest pain.  Likely demand ischemia from acute illness.  Moderate pericardial effusion: Noted on CTA chest.  No clinical tamponade.  Follow TTE.  Sinus tachycardia: May be multifactorial related to recent DKA, postobstructive pneumonia, anemia.  Follow TSH.  Continue telemetry.  Treat underlying cause.  Abnormal LFTs: AST and ALT were normal on 4/22 but elevated on admission.  Better the next day.  Follow LFTs today and may need further evaluation if persistent.  CTA chest showed visualized portion of liver is unremarkable.  Stage IV non-small cell lung cancer/adenocarcinoma: Alerted oncology via [facility] and can follow-up outpatient.  Essential hypertension: Controlled.  Hypothyroid: Continue levothyroxine.  TSH 4/22 was markedly abnormal: 21.5.  Repeat TSH.  Acute respiratory failure with hypoxia: Secondary to lung cancer and postobstructive pneumonia.  Continue oxygen supplements while treating underlying cause.     DVT prophylaxis: Subcutaneous heparin Code Status: Full Family Communication: None at bedside Disposition: To be determined pending clinical improvement.  Transfer to telemetry 5/15.  PT ordered.   Consultants:  None  Procedures:  None  Antimicrobials:  Oral levofloxacin 5/14 >   Subjective: Patient reports right lower anterior pleuritic chest pain.  Cough with intermittent mild creamy sputum.  Indicates  that her dyspnea is better.  As per nursing, elevated CBGs as below and poor oral  intake.  Discussed with RN and diabetes coordinator.  ROS: As above.  Objective:  Vitals:   07/10/17 0500 07/10/17 0600 07/10/17 0800 07/10/17 1000  BP:  (!) 110/46 (!) 121/43 (!) 147/59  Pulse: 100 (!) 108 (!) 109 (!) 110  Resp: (!) 22 20 (!) 27 (!) 32  Temp:   97.8 F (36.6 C)   TempSrc:   Oral   SpO2: 94% 95% 95% 94%  Weight:      Height:        Examination:  General exam: Pleasant middle-aged female, small built, frail, cachectic and chronically ill looking lying comfortably propped up in bed. Respiratory system: Reduced breath sounds in the right base with few crackles.  Rest of lung fields with harsh breath sounds. Respiratory effort normal. Cardiovascular system: S1 & S2 heard, regular and mild tachycardia.Marland Kitchen No JVD, murmurs, rubs, gallops or clicks. No pedal edema.  Telemetry personally reviewed: Sinus tachycardia in the 100s-120s. Gastrointestinal system: Abdomen is nondistended, soft and nontender. No organomegaly or masses felt. Normal bowel sounds heard. Central nervous system: Alert and oriented. No focal neurological deficits. Extremities: Symmetric 5 x 5 power. Skin: No rashes, lesions or ulcers Psychiatry: Judgement and insight impaired. Mood & affect flat.     Data Reviewed: I have personally reviewed following labs and imaging studies  CBC: Recent Labs  Lab 07/06/17 1312 07/07/17 0702 07/09/17 0334  WBC 13.2*  --  8.0  NEUTROABS 9.9*  --   --   HGB 9.5*  --  10.8*  HCT 26.0* 27.0* 31.6*  MCV 80.0  --  80.8  PLT 319  --  397   Basic Metabolic Panel: Recent Labs  Lab 07/07/17 0924 07/07/17 1342 07/07/17 1758 07/07/17 2129 07/08/17 1435 07/09/17 2341 07/10/17 1027  NA 144 142 140 144  --  136  --   K 4.4 3.4* 2.8* 2.7* 4.6 4.8  --   CL 113* 109 108 111  --  107  --   CO2 14* 23 21* 21*  --  20*  --   GLUCOSE 189* 284* 129* 52*  --  231* 452*  BUN 17 15 13 11   --  17  --   CREATININE 1.10* 1.04* 0.96 0.81  --  0.91  --   CALCIUM 7.7* 7.8*  7.9* 8.1*  --  8.1*  --   MG  --   --   --  1.1*  --   --   --    Liver Function Tests: Recent Labs  Lab 07/06/17 1312 07/07/17 0618  AST 387* 234*  ALT 283* 213*  ALKPHOS 529* 423*  BILITOT 1.8* 0.7  PROT 7.7 6.1*  ALBUMIN 3.1* 2.5*   Coagulation Profile: Recent Labs  Lab 07/07/17 0709  INR 1.08   Cardiac Enzymes: Recent Labs  Lab 07/06/17 1312 07/07/17 0618 07/07/17 1109 07/07/17 1758  TROPONINI <0.03 0.11* 0.09* 0.07*   HbA1C: No results for input(s): HGBA1C in the last 72 hours. CBG: Recent Labs  Lab 07/09/17 1557 07/09/17 1718 07/09/17 2212 07/10/17 0752 07/10/17 0939  GLUCAP 126* 124* 138* 393* 428*    Recent Results (from the past 240 hour(s))  Culture, blood (routine x 2)     Status: None (Preliminary result)   Collection Time: 07/06/17  6:40 PM  Result Value Ref Range Status   Specimen Description   Final    BLOOD RIGHT ANTECUBITAL  Performed at [facility], [address] [place], [place] [address]    Special Requests   Final    BOTTLES DRAWN AEROBIC AND ANAEROBIC Blood Culture adequate volume Performed at [facility], [address] [place], [place] [address]    Culture   Final    NO GROWTH 3 DAYS Performed at [facility], [place] [address] [place], [place] [address]    Report Status PENDING  Incomplete  MRSA PCR Screening     Status: None   Collection Time: 07/06/17  8:48 PM  Result Value Ref Range Status   MRSA by PCR NEGATIVE NEGATIVE Final    Comment:        The GeneXpert MRSA Assay (FDA approved for NASAL specimens only), is one component of a comprehensive MRSA colonization surveillance program. It is not intended to diagnose MRSA infection nor to guide or monitor treatment for MRSA infections. Performed at [facility], [place] [address], [place], [place] [address]          Radiology Studies: Ct Angio Chest Pe W Or Wo Contrast  Result Date: 07/08/2017 CLINICAL DATA:   Chest discomfort.  Possible pulmonary embolus. EXAM: CT ANGIOGRAPHY CHEST WITH CONTRAST TECHNIQUE: Multidetector CT imaging of the chest was performed using the standard protocol during bolus administration of intravenous contrast. Multiplanar CT image reconstructions and MIPs were obtained to evaluate the vascular anatomy. CONTRAST:  45mL ISOVUE-370 IOPAMIDOL (ISOVUE-370) INJECTION 76% COMPARISON:  06/10/2017. FINDINGS: Cardiovascular: Negative for pulmonary embolus. Atherosclerotic calcification of the arterial vasculature, including coronary arteries. Pulmonary arteries and heart are enlarged. New moderate pericardial effusion. Mediastinum/Nodes: Low internal jugular lymph nodes on the left measure up to 8 mm. No pathologically enlarged mediastinal lymph nodes. Right infrahilar mass measures approximately 3.3 x 3.6 cm and markedly narrows the right superior pulmonary vein. There is encasement and mild narrowing of the right middle and right lower lobe pulmonary arteries. Findings are similar to 06/10/2017. No left hilar adenopathy. Subpectoral and axillary lymph nodes measure up to 7 mm on the left. Esophagus is grossly unremarkable. Lungs/Pleura: Severe centrilobular emphysema with bullous changes in the right apex. New collapse/consolidation in the right lower lobe obscures a previously measured nodule in the superior segment right lower lobe. Small right pleural effusion is new and is partially loculated in the upper right hemithorax. There is obstruction of the right lower lobe bronchus by the right infrahilar mass, which is new from 06/10/2017. Associated fluid-filled bronchi in the right lower lobe. Upper Abdomen: Visualized portion of the liver is unremarkable. Irregular right adrenal gland mass measures 2.4 x 3.8 cm, previously 2.1 x 3.4 cm. Visualized portions of the left adrenal gland and kidneys are unremarkable. Previously measured ill-defined low-attenuation lesion in the spleen is not readily  visualized, possibly due to differences in bolus timing. Visualized portions of the pancreas, stomach and bowel are grossly unremarkable. High aortocaval lymph node measures 10 mm, similar. Musculoskeletal: No worrisome lytic or sclerotic lesions. Degenerative changes in the spine. Review of the MIP images confirms the above findings. IMPRESSION: 1. Negative for pulmonary embolus. 2. Right infrahilar mass is grossly stable in size but with new obstruction of the right lower lobe bronchus and postobstructive pneumonitis/pneumonia in the right lower lobe. Previously measured nodule in the superior segment right lower lobe is now obscured. 3. Enlarging right adrenal metastasis. Previously seen splenic lesion is not well visualized today, possibly due to bolus timing. 4. New small right pleural effusion.  5. New moderate pericardial effusion. 6. Enlarged pulmonary arteries, indicative of pulmonary arterial hypertension. 7.  Aortic atherosclerosis (ICD10-170.0). 8.  Emphysema (ICD10-J43.9). Electronically Signed   By: Lorin Picket M.D.   On: 07/08/2017 12:00        Scheduled Meds: . aspirin EC  81 mg Oral Daily  . clopidogrel  75 mg Oral Q breakfast  . feeding supplement (PRO-STAT SUGAR FREE 64)  30 mL Oral BID  . ferrous sulfate  325 mg Oral Daily  . heparin injection (subcutaneous)  5,000 Units Subcutaneous Q8H  . insulin aspart  0-15 Units Subcutaneous TID WC  . insulin aspart  0-5 Units Subcutaneous QHS  . insulin glargine  5 Units Subcutaneous Daily  . levofloxacin  750 mg Oral Q48H  . levothyroxine  100 mcg Oral QAC breakfast  . loratadine  10 mg Oral Daily  . potassium chloride  40 mEq Oral BID   Continuous Infusions:   LOS: 4 days     Vernell Leep, MD, FACP, [facility]. [facility] Pager [phone]  If 7PM-7AM, please contact night-coverage [email or web address] Password [facility] 07/10/2017, 11:34 AM

## 2017-07-10 NOTE — Progress Notes (Signed)
Pt was awake, alert, and lying in bed when I arrived. Her sister, a niece and nephew were bedside. A host of additional family members joined during the visit. Pt's sister said she didn't know what to think about "it". She described the "it" as to what they were told earlier and said she wanted to hear it from the doctor. Pt and family spoke of being told pt has fluid on her lung and there is another spot of cancer on her lung. Pt said they can remove the fluid, but family said that would be painful and they do not want her to be in pain. Pt and family said hospice was also mentioned to them. Pt, although uncertain, said she would rather they drain the fluid. She said she just needed to talk w/the doctor. She and family have questions and I encouraged them to right their questions down and discuss them when the dr arrives. Pt said, "this is a hard one to make".  The family is very closely knit and were very attentive to the pt. She told them all that she loves them.  Pt's sister was painful during portions of our visit. Pt asked for prayer during the introduction of our visit. We had prayer bedside of pt during which she became tearful as did her niece and sister. They were again very attentive to pt as she wiped tears.  Pt was encouraged to request a Chaplain if additional support is needed. Pt and family were very appreciative of visit.  Please page if that need arises.  Rendon, MDiv   07/10/17 2300  Clinical Encounter Type  Visited With Patient and family together

## 2017-07-10 NOTE — Progress Notes (Addendum)
Addendum  Repeat labs from 12:23 PM reviewed (unfortunately did not get call back as requested): Bicarbonate 14, glucose 336, creatinine 1.28, anion gap 17, magnesium 1.3.  Also noted worsening AST and ALT.  Concern for recurrent DKA.  TTE results appreciated: Moderate to large pericardial effusion measuring 1.84 x 1.08 centimeter at largest diameter.  RA inversion for >50% of cardiac cycle and possible RV diastolic collapse but difficult to discern because the RV appears underfilled.  No clinical pulses paradoxus.  Plan:  Check serum lactate, beta hydroxybutyrate and ABG for pH. Resume IV fluids and insulin drip per DKA protocol.  Monitor BMP closely. Ordered right upper quadrant ultrasound. Discussed in detail with RN. Discussed with Dr. Einar Gip, Cardiology on call who indicated that patient's echo and clinical picture not indicative of Tamponade physiology suspects effusion is related to stage 4 lung cancer and recommends avoiding tapping because likely to reaccumulate.  I discussed with patient's son, updated care in detail. Explained critical nature of her illness and overall poor prognosis. He believes that she would not want aggressive measures, I.e resuscitation in the event of a cardiac arrest but would like this discussed with the patient to clarify her wishes. Will d/w Dr. Mckinley Jewel in am re a Palliative Consult.   Vernell Leep, MD, FACP, [facility]. [facility] Pager [phone]  If 7PM-7AM, please contact night-coverage [email or web address] Password [facility] 07/10/2017, 6:37 PM

## 2017-07-10 NOTE — Progress Notes (Signed)
Spoke to pt about code status. She wishes to be a FULL CODE. Patrica Duel, NP [facility]

## 2017-07-10 NOTE — Progress Notes (Addendum)
Nutrition Follow-up  DOCUMENTATION CODES:   Severe malnutrition in context of chronic illness, Underweight  INTERVENTION:  Continue pro-stat BID D/c magic cup Add Ensure Enlive po once per day, each supplement provides 350 kcal and 20 grams of protein Add Vital Cuisine once per day, each supplement provides 520kcal and 22g of protein.  Add MVI - daily  NUTRITION DIAGNOSIS:   Severe Malnutrition related to cancer and cancer related treatments as evidenced by percent weight loss, severe fat depletion, severe muscle depletion(11% in 2 months).  Ongoing  GOAL:   Patient will meet greater than or equal to 90% of their needs, Weight gain  Ongoing - progressing  MONITOR:   Supplement acceptance, PO intake, Skin, TF tolerance, Weight trends, Labs, I & O's  REASON FOR ASSESSMENT:   Other (Comment)(Pt familiar to [facility]; past malnutrition diagnosis with cancer)    ASSESSMENT:   67 y.o. F admitted on 07/06/17 for DKA with hypokalemia, anemia, and renal insufficiency. Pt with stg IV lung cancer with chemotherapy. Hx of radiation therapy (2017), and past severe malnutrition in context of chronic illness diagnosis (3/19). PMH of T2DM, htn, hypothyroidism, iron deficiency anemia due to chronic blood loss, adult failure to thrive, and heart murmur.   Per MD note pt also found to have PNA, no longer conerned about PE.  Pt looked weak/tired upon dietetic intern visit. Pt reports only eating breakfast yesterday morning consisting of cereal. Pt reports not receiving pro-stat and reports that she does not like the magic cups she is getting; she said her tastebuds are the problem. When dietetic intern ask what kinds of foods she would like to eat, pt reports that the only thing that she can have is water and actually likes is water. Pt mentioned that she will try and drink the ensure. Discussed with pt trying different supplements to see what she will take to increase her calorie and protein  intake; pt is open to this.   Spoke with MD over the phone to discuss pt PO intake and possible appetite stimulant; MD reports he will put pt on whichever medication she was on at home to stimulate her appetite.   Medications reviewed: ferrous sulfate, heparin, novolog 0-15 & 0-5 units, lantus 5 units, levaquin, levothyroxine, potassium chloride, morphine.  Labs reviewed: CO2 20 (L), BG 231 (H), K+ 4.8 (WNL).   Diet Order:   Diet Order           Diet Carb Modified Fluid consistency: Thin; Room service appropriate? Yes  Diet effective now          EDUCATION NEEDS:   Education needs have been addressed  Skin:  Skin Assessment: Reviewed RN Assessment  Last BM:  07/09/17  Height:   Ht Readings from Last 1 Encounters:  07/06/17 5\' 3"  (1.6 m)    Weight:   Wt Readings from Last 1 Encounters:  07/06/17 78 lb 4.2 oz (35.5 kg)    Ideal Body Weight:  52.27 kg  BMI:  Body mass index is 13.86 kg/m.  Estimated Nutritional Needs:   Kcal:  1500-1700 kcal  Protein:  75-90 grams  Fluid:  1.5-1.7 L    Hope Budds, Dietetic Intern

## 2017-07-10 NOTE — Progress Notes (Signed)
Inpatient Diabetes Program Recommendations  AACE/ADA: New Consensus Statement on Inpatient Glycemic Control (2015)  Target Ranges:  Prepandial:   less than 140 mg/dL      Peak postprandial:   less than 180 mg/dL (1-2 hours)      Critically ill patients:  140 - 180 mg/dL   Lab Results  Component Value Date   GLUCAP 428 (H) 07/10/2017   HGBA1C 8.8 (H) 05/09/2017    Review of Glycemic Control  Blood sugars very labile.  CBGs this am - 393, 428. Pt unsure amount of short-acting insulin she takes at home.  Orders: Lantus 5 units QAM Novolog 0-15 units units tidwc and hs  Inpatient Diabetes Program Recommendations:     Increase Lantus to 7 units QAM. (Give 1x dose of Lantus 2 units this am) Change Novolog to 0-15 units Q4H  Will follow closely.  Thank you. Lorenda Peck, RD, LDN, CDE Inpatient Diabetes Coordinator [phone]

## 2017-07-11 ENCOUNTER — Inpatient Hospital Stay (HOSPITAL_COMMUNITY): Payer: Medicare Other

## 2017-07-11 DIAGNOSIS — I318 Other specified diseases of pericardium: Secondary | ICD-10-CM

## 2017-07-11 DIAGNOSIS — R627 Adult failure to thrive: Secondary | ICD-10-CM

## 2017-07-11 DIAGNOSIS — J9601 Acute respiratory failure with hypoxia: Secondary | ICD-10-CM

## 2017-07-11 DIAGNOSIS — C3431 Malignant neoplasm of lower lobe, right bronchus or lung: Secondary | ICD-10-CM

## 2017-07-11 DIAGNOSIS — C801 Malignant (primary) neoplasm, unspecified: Secondary | ICD-10-CM

## 2017-07-11 LAB — BASIC METABOLIC PANEL
ANION GAP: 7 (ref 5–15)
ANION GAP: 9 (ref 5–15)
BUN: 19 mg/dL (ref 6–20)
BUN: 21 mg/dL — AB (ref 6–20)
CO2: 20 mmol/L — ABNORMAL LOW (ref 22–32)
CO2: 21 mmol/L — ABNORMAL LOW (ref 22–32)
CREATININE: 0.85 mg/dL (ref 0.44–1.00)
Calcium: 7.9 mg/dL — ABNORMAL LOW (ref 8.9–10.3)
Calcium: 7.8 mg/dL — ABNORMAL LOW (ref 8.9–10.3)
Chloride: 111 mmol/L (ref 101–111)
Chloride: 112 mmol/L — ABNORMAL HIGH (ref 101–111)
Creatinine, Ser: 0.91 mg/dL (ref 0.44–1.00)
GFR calc Af Amer: >60 mL/min (ref >60)
GFR calc Af Amer: >60 mL/min (ref >60)
GLUCOSE: 54 mg/dL — AB (ref 65–99)
Glucose, Bld: 94 mg/dL (ref 65–99)
POTASSIUM: 3.8 mmol/L (ref 3.5–5.1)
Potassium: 3.6 mmol/L (ref 3.5–5.1)
Sodium: 140 mmol/L (ref 135–145)
Sodium: 140 mmol/L (ref 135–145)

## 2017-07-11 LAB — COMPREHENSIVE METABOLIC PANEL
ALT: 255 U/L — ABNORMAL HIGH (ref 14–54)
AST: 631 U/L — ABNORMAL HIGH (ref 15–41)
Albumin: 2.2 g/dL — ABNORMAL LOW (ref 3.5–5.0)
Alkaline Phosphatase: 752 U/L — ABNORMAL HIGH (ref 38–126)
Anion gap: 9 (ref 5–15)
BUN: 8 mg/dL (ref 6–20)
CO2: 19 mmol/L — ABNORMAL LOW (ref 22–32)
Calcium: 7.6 mg/dL — ABNORMAL LOW (ref 8.9–10.3)
Chloride: 111 mmol/L (ref 101–111)
Creatinine, Ser: 0.9 mg/dL (ref 0.44–1.00)
GFR calc Af Amer: >60 mL/min (ref >60)
GFR calc non Af Amer: >60 mL/min (ref >60)
Glucose, Bld: 100 mg/dL — ABNORMAL HIGH (ref 65–99)
Potassium: 3.8 mmol/L (ref 3.5–5.1)
Sodium: 139 mmol/L (ref 135–145)
Total Bilirubin: 0.8 mg/dL (ref 0.3–1.2)
Total Protein: 5.9 g/dL — ABNORMAL LOW (ref 6.5–8.1)

## 2017-07-11 LAB — CBC
HCT: 26.6 % — ABNORMAL LOW (ref 36.0–46.0)
Hemoglobin: 9 g/dL — ABNORMAL LOW (ref 12.0–15.0)
MCH: 27.8 pg (ref 26.0–34.0)
MCHC: 33.8 g/dL (ref 30.0–36.0)
MCV: 82.1 fL (ref 78.0–100.0)
Platelets: 260 10*3/uL (ref 150–400)
RBC: 3.24 MIL/uL — ABNORMAL LOW (ref 3.87–5.11)
RDW: 16.3 % — ABNORMAL HIGH (ref 11.5–15.5)
WBC: 7 10*3/uL (ref 4.0–10.5)

## 2017-07-11 LAB — GLUCOSE, CAPILLARY
GLUCOSE-CAPILLARY: 153 mg/dL — AB (ref 65–99)
GLUCOSE-CAPILLARY: 195 mg/dL — AB (ref 65–99)
GLUCOSE-CAPILLARY: 54 mg/dL — AB (ref 65–99)
GLUCOSE-CAPILLARY: 67 mg/dL (ref 65–99)
Glucose-Capillary: 109 mg/dL — ABNORMAL HIGH (ref 65–99)
Glucose-Capillary: 132 mg/dL — ABNORMAL HIGH (ref 65–99)
Glucose-Capillary: 213 mg/dL — ABNORMAL HIGH (ref 65–99)
Glucose-Capillary: 255 mg/dL — ABNORMAL HIGH (ref 65–99)
Glucose-Capillary: 50 mg/dL — ABNORMAL LOW (ref 65–99)
Glucose-Capillary: 68 mg/dL (ref 65–99)
Glucose-Capillary: 86 mg/dL (ref 65–99)
Glucose-Capillary: 94 mg/dL (ref 65–99)

## 2017-07-11 LAB — PLATELET INHIBITION P2Y12: Platelet Function  P2Y12: 149 [PRU] — ABNORMAL LOW (ref 194–418)

## 2017-07-11 LAB — CULTURE, BLOOD (ROUTINE X 2)
Culture: NO GROWTH
Special Requests: ADEQUATE

## 2017-07-11 LAB — PREPARE RBC (CROSSMATCH)

## 2017-07-11 LAB — PROTIME-INR
INR: 1.11
Prothrombin Time: 14.2 seconds (ref 11.4–15.2)

## 2017-07-11 LAB — MAGNESIUM: MAGNESIUM: 2.1 mg/dL (ref 1.7–2.4)

## 2017-07-11 LAB — APTT: aPTT: 46 seconds — ABNORMAL HIGH (ref 24–36)

## 2017-07-11 LAB — ABO/RH: ABO/RH(D): O POS

## 2017-07-11 MED ORDER — SODIUM CHLORIDE 0.9 % IV SOLN
INTRAVENOUS | Status: DC
  Administered 2017-07-11 – 2017-07-12 (×4): via INTRAVENOUS

## 2017-07-11 MED ORDER — INSULIN ASPART 100 UNIT/ML ~~LOC~~ SOLN: 0.0000 [IU] | Freq: Every day | SUBCUTANEOUS | Status: DC

## 2017-07-11 MED ORDER — DEXTROSE 50 % IV SOLN
25.0000 mL | Freq: Once | INTRAVENOUS | Status: DC
  Administered 2017-07-12: 25 mL via INTRAVENOUS

## 2017-07-11 MED ORDER — INSULIN GLARGINE 100 UNIT/ML ~~LOC~~ SOLN
7.0000 [IU] | Freq: Two times a day (BID) | SUBCUTANEOUS | Status: DC
  Administered 2017-07-11: 7 [IU] via SUBCUTANEOUS
  Filled 2017-07-11 (×2): qty 0.07

## 2017-07-11 MED ORDER — INSULIN ASPART 100 UNIT/ML ~~LOC~~ SOLN
0.0000 [IU] | Freq: Three times a day (TID) | SUBCUTANEOUS | Status: DC
  Administered 2017-07-11: 5 [IU] via SUBCUTANEOUS
  Administered 2017-07-11: 3 [IU] via SUBCUTANEOUS
  Administered 2017-07-12: 2 [IU] via SUBCUTANEOUS

## 2017-07-11 MED ORDER — ALBUTEROL SULFATE (2.5 MG/3ML) 0.083% IN NEBU: 2.5000 mg | INHALATION_SOLUTION | Freq: Four times a day (QID) | RESPIRATORY_TRACT | Status: DC | PRN

## 2017-07-11 MED ORDER — GUAIFENESIN ER 600 MG PO TB12
1200.0000 mg | ORAL_TABLET | Freq: Two times a day (BID) | ORAL | Status: DC
Start: 2017-07-11 — End: 2017-07-12
  Administered 2017-07-11 – 2017-07-12 (×3): 1200 mg via ORAL
  Filled 2017-07-11 (×3): qty 2

## 2017-07-11 MED ORDER — DEXTROSE 50 % IV SOLN
INTRAVENOUS | Status: AC
  Administered 2017-07-11: 50 mL
  Filled 2017-07-11: qty 50

## 2017-07-11 MED ORDER — ALBUMIN HUMAN 25 % IV SOLN
12.5000 g | Freq: Four times a day (QID) | INTRAVENOUS | Status: AC
  Administered 2017-07-11 – 2017-07-12 (×3): 12.5 g via INTRAVENOUS
  Filled 2017-07-11 (×3): qty 50

## 2017-07-11 MED ORDER — INSULIN GLARGINE 100 UNIT/ML ~~LOC~~ SOLN
2.0000 [IU] | Freq: Once | SUBCUTANEOUS | Status: AC
  Administered 2017-07-11: 2 [IU] via SUBCUTANEOUS
  Filled 2017-07-11: qty 0.02

## 2017-07-11 MED ORDER — VANCOMYCIN HCL 500 MG IV SOLR
500.0000 mg | INTRAVENOUS | Status: AC
  Administered 2017-07-12: 500 mg via INTRAVENOUS
  Filled 2017-07-11 (×2): qty 500

## 2017-07-11 NOTE — Progress Notes (Signed)
Brittany Mercado called in regards to Brittany Mercado abdomen limited RUQ. RN made aware that patient has been NPO. Brittany Mercado unsure of time but "will get as soon as they can". Will continue to monitor.

## 2017-07-11 NOTE — Progress Notes (Signed)
OT Cancellation Note  Patient Details Name: Brittany Mercado MRN: [id number] DOB: 1950-07-06   Cancelled Treatment:    Reason Eval/Treat Not Completed: Fatigue/lethargy limiting ability to participate  Spoke with family regarding role of OT. Pt very sleeping at this time.  Will check back on pt later in day or next day. Kari Baars, [place]  Payton Mccallum D 07/11/2017, 12:05 PM

## 2017-07-11 NOTE — Consult Note (Addendum)
[address]       [place] [address]             [phone]        Brittany Mercado Medical Record #[id number] Date of Birth: March 08, 1950  Referring: No ref. provider found Primary Care: [name], DO Primary Cardiologist:No primary care provider on file.  Chief Complaint:    Chief Complaint  Patient presents with  . Chest Pain    History of Present Illness:      Brittany Mercado is a 66 year old female with a past medical history significant for stage IV non-small cell lung cancer/adenocarcinoma currently on immunotherapy every 3 weeks followed by Dr. [name], diabetes mellitus type 2 with history of DKA, anemia, hypertension, SIRS, hypothyroidism, lactic acidosis, GERD, severe protein-calorie malnutrition, and previous smoking history who presents to [place] [facility] with complaints of nausea and not feeling well.  The patient states that her sugars were high and therefore she went to the emergency department.  She was started on IV insulin.  She was found to be hyperkalemic and tachycardic.  She is being currently treated for lobar postobstructive pneumonia of the RLL.  She initially underwent a CT of the chest which was negative for pulmonary embolus.  It did show a right infrahilar mass which is grossly stable in size and a new obstruction of the right lower lobe bronchus and postobstructive pneumonia in the right lower lobe.  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  There is also a new right pericardial effusion.  An echocardiogram was then obtained which showed a moderate to large pericardial effusion.  We are consulted for possible pericardial window.  The patient lives at home with her family, she uses a walker to help her ambulate, and she is not on home oxygen. She is adamant about having the surgery because she wants to be there for her family.      Current Activity/ Functional Status: Patient was independent with mobility/ambulation,  transfers, ADL's, IADL's.   Zubrod Score: At the time of surgery this patient's most appropriate activity status/level should be described as: []     0    Normal activity, no symptoms []     1    Restricted in physical strenuous activity but ambulatory, able to do out light work []     2    Ambulatory and capable of self care, unable to do work activities, up and about                 more than 50%  Of the time                            [x]     3    Only limited self care, in bed greater than 50% of waking hours []     4    Completely disabled, no self care, confined to bed or chair []     5    Moribund  Past Medical History:  Diagnosis Date  . Adenocarcinoma of right lung, stage 4 ([facility]) 11/30/2015  . Adult failure to thrive   . Complication of anesthesia    hard to wake up  . Diabetes mellitus type 2 in nonobese (HCC)   . Eczema   . Encounter for antineoplastic chemotherapy 11/30/2015  . Heart murmur    told at age 21 but never has had any problems  . History of radiation therapy  01/26/16-02/07/16   right pelvis 30 Gy in 10 fractions  . HTN (hypertension) 02/01/2016  . 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 Iron deficiency anemia due to chronic blood loss 12/10/2016    Past Surgical History:  Procedure Laterality Date  . ABDOMINAL AORTOGRAM W/LOWER EXTREMITY N/A 05/29/2017   Procedure: ABDOMINAL AORTOGRAM W/LOWER EXTREMITY;  Surgeon: Serafina Mitchell, MD;  Location: [facility];  Service: Cardiovascular;  Laterality: N/A;  . ABDOMINAL HYSTERECTOMY    . OOPHORECTOMY  1996   tumor removed from right ovary  . PERIPHERAL VASCULAR ATHERECTOMY  05/29/2017   Procedure: PERIPHERAL VASCULAR ATHERECTOMY;  Surgeon: Serafina Mitchell, MD;  Location: [facility];  Service: Cardiovascular;;  right superficiacl femoral  . TONSILLECTOMY    . VIDEO BRONCHOSCOPY WITH ENDOBRONCHIAL NAVIGATION N/A 11/18/2015   Procedure: VIDEO BRONCHOSCOPY WITH ENDOBRONCHIAL NAVIGATION;  Surgeon: Melrose Nakayama, MD;  Location: [facility];  Service: Thoracic;  Laterality: N/A;  . VIDEO BRONCHOSCOPY WITH ENDOBRONCHIAL ULTRASOUND N/A 11/18/2015   Procedure: VIDEO BRONCHOSCOPY WITH ENDOBRONCHIAL ULTRASOUND;  Surgeon: Melrose Nakayama, MD;  Location: [facility];  Service: Thoracic;  Laterality: N/A;    Social History   Tobacco Use  Smoking Status Former Smoker  . Packs/day: 0.50  . Years: 30.00  . Pack years: 15.00  . Last attempt to quit: 11/01/2015  . Years since quitting: 1.6  Smokeless Tobacco Never Used    Social History   Substance and Sexual Activity  Alcohol Use No     Allergies  Allergen Reactions  . Penicillins Swelling    SWELLING REACTION UNSPECIFIED  PATIENT HAS HAD A PCN REACTION WITH IMMEDIATE RASH, FACIAL/TONGUE/THROAT SWELLING, SOB, OR LIGHTHEADEDNESS WITH HYPOTENSION:  #  #  YES  #  #  Has patient had a PCN reaction causing severe rash involving mucus membranes or skin necrosis: No Has patient had a PCN reaction that required hospitalization No Has patient had a PCN reaction occurring within the last 10 years: No If all of the above answers are "NO", then may proceed with Cephalosporin use.    Current Facility-Administered Medications  Medication Dose Route Frequency Provider Last Rate Last Dose  . 0.9 %  sodium chloride infusion   Intravenous Continuous Kirby-Graham, Karsten Fells, NP   Stopped at 07/10/17 2359  . albuterol (PROVENTIL) (2.5 MG/3ML) 0.083% nebulizer solution 2.5 mg  2.5 mg Nebulization Q6H PRN Hongalgi, Lenis Dickinson, MD      . aspirin EC tablet 81 mg  81 mg Oral Daily Jani Gravel, MD   81 mg at 07/11/17 0900  . famotidine (PEPCID) tablet 10 mg  10 mg Oral Daily PRN Jani Gravel, MD      . feeding supplement (ENSURE ENLIVE) (ENSURE ENLIVE) liquid 237 mL  237 mL Oral Q24H Modena Jansky, MD   237 mL at 07/10/17 2020  . feeding supplement (PRO-STAT SUGAR FREE 64) liquid 30 mL  30 mL Oral BID Jani Gravel, MD   30 mL at 07/10/17 2107  . ferrous sulfate tablet 325 mg   325 mg Oral Daily Jani Gravel, MD   325 mg at 07/11/17 0900  . guaiFENesin (MUCINEX) 12 hr tablet 1,200 mg  1,200 mg Oral BID Vernell Leep D, MD   1,200 mg at 07/11/17 1412  . heparin injection 5,000 Units  5,000 Units Subcutaneous Q8H Eudelia Bunch, RPH   5,000 Units at 07/11/17 1415  . insulin aspart (novoLOG) injection 0-5 Units  0-5 Units Subcutaneous QHS Hongalgi, Lenis Dickinson, MD      .  insulin aspart (novoLOG) injection 0-9 Units  0-9 Units Subcutaneous TID WC Modena Jansky, MD   5 Units at 07/11/17 1136  . insulin glargine (LANTUS) injection 7 Units  7 Units Subcutaneous Daily Modena Jansky, MD   7 Units at 07/11/17 [phone]  . levofloxacin (LEVAQUIN) tablet 750 mg  750 mg Oral Q48H BellSharyn Lull T, RPH   750 mg at 07/11/17 1246  . levothyroxine (SYNTHROID, LEVOTHROID) tablet 100 mcg  100 mcg Oral QAC breakfast Jani Gravel, MD   100 mcg at 07/11/17 0859  . loratadine (CLARITIN) tablet 10 mg  10 mg Oral Daily Jani Gravel, MD   10 mg at 07/11/17 0900  . megestrol (MEGACE) 400 MG/10ML suspension 800 mg  800 mg Oral Daily Modena Jansky, MD   800 mg at 07/11/17 0902  . morphine 4 MG/ML injection 1 mg  1 mg Intravenous Q4H PRN Velvet Bathe, MD   1 mg at 07/10/17 0440  . multivitamin with minerals tablet 1 tablet  1 tablet Oral Daily Modena Jansky, MD   1 tablet at 07/11/17 0900  . oxyCODONE (Oxy IR/ROXICODONE) immediate release tablet 5-10 mg  5-10 mg Oral Q6H PRN Velvet Bathe, MD   10 mg at 07/11/17 7824    Medications Prior to Admission  Medication Sig Dispense Refill Last Dose  . aspirin EC 81 MG EC tablet Take 1 tablet (81 mg total) by mouth daily. 30 tablet 0 07/05/2017 at Unknown time  . cetirizine (ZYRTEC) 10 MG tablet TAKE 1 TABLET(10 MG) BY MOUTH DAILY 30 tablet 1 07/05/2017 at Unknown time  . clopidogrel (PLAVIX) 75 MG tablet Take 1 tablet (75 mg total) by mouth daily with breakfast. 30 tablet 0 07/05/2017 at Unknown time  . ferrous sulfate 325 (65 FE) MG tablet Take 325 mg  by mouth daily.   07/05/2017 at Unknown time  . glimepiride (AMARYL) 2 MG tablet TAKE 1 TABLET(2 MG) BY MOUTH DAILY WITH BREAKFAST 90 tablet 1 07/05/2017 at Unknown time  . Insulin Glargine (LANTUS) 100 UNIT/ML Solostar Pen Inject 10 Units into the skin daily at 10 pm. 15 mL 0 07/05/2017 at Unknown time  . insulin lispro (HUMALOG) 100 UNIT/ML KiwkPen Inject 0.03 mLs (3 Units total) into the skin 3 (three) times daily with meals. 15 mL 11 07/05/2017 at Unknown time  . levothyroxine (SYNTHROID, LEVOTHROID) 100 MCG tablet TAKE 1 TABLET(100 MCG) BY MOUTH DAILY BEFORE BREAKFAST 30 tablet 0 07/05/2017 at Unknown time  . megestrol (MEGACE ES) 625 MG/5ML suspension Take 5 mLs (625 mg total) by mouth daily. 150 mL 2 07/05/2017 at Unknown time  . metFORMIN (GLUCOPHAGE) 500 MG tablet TAKE 1 TABLET BY MOUTH IN THE MORNING FOR 1 WEEK THEN TAKE 1 TABLET BY MOUTH TWICE DAILY 163 tablet 2 07/05/2017 at Unknown time  . Multiple Vitamin (MULTIVITAMIN) tablet Take 1 tablet by mouth daily.   07/05/2017 at Unknown time  . blood glucose meter kit and supplies KIT Dispense based on patient and insurance preference. Use up to four times daily as directed. (FOR ICD-9 250.00, 250.01). 1 each 0 Taking  . diclofenac sodium (VOLTAREN) 1 % GEL Apply layer to chest 4 times daily as needed for pain. 1 Tube 1 unknown  . famotidine (PEPCID AC) 10 MG chewable tablet Chew 10 mg by mouth daily as needed for heartburn.   unknown  . Insulin Pen Needle (B-D ULTRAFINE III SHORT PEN) 31G X 8 MM MISC USE AS DIRECTED WITH INSULIN 100 each 2 Taking  .  meloxicam (MOBIC) 7.5 MG tablet Take 7.5 mg by mouth daily as needed for pain.   1 unknown  . metFORMIN (GLUCOPHAGE) 500 MG tablet Take 2 tablets (1,000 mg total) by mouth 2 (two) times daily with a meal. (take as written in dc summary) 120 tablet 0 05/23/2017  . oxyCODONE-acetaminophen (PERCOCET) 10-325 MG tablet Take 1 tablet by mouth every 12 (twelve) hours as needed for pain. 60 tablet 0 unknown  .  protein supplement shake (PREMIER PROTEIN) LIQD Take 325 mLs (11 oz total) by mouth daily. 12 Can 3 unknown    Family History  Problem Relation Age of Onset  . Colon cancer Neg Hx   . Esophageal cancer Neg Hx   . Rectal cancer Neg Hx   . Stomach cancer Neg Hx      Review of Systems:   ROS Pertinent items are noted in HPI.     Cardiac Review of Systems: Y or  [    ]= no  Chest Pain [    ]  Resting SOB [ Y  ] Exertional SOB  [ Y]  Orthopnea [  ]   Pedal Edema [ N  ]    Palpitations [  ] Syncope  [  ]   Presyncope [   ]  General Review of Systems: [Y] = yes [  ]=no Constitional: recent weight change [  ]; anorexia [Y  ]; fatigue [ Y ]; nausea [  ]; night sweats [  ]; fever Aqua.Slicker  ]; or chills [ N ]                                                               Dental: Last Dentist visit:   Eye : blurred vision [  ]; diplopia [   ]; vision changes [  ];  Amaurosis fugax[  ]; Resp: cough [ N ];  wheezing[ N ];  hemoptysis[  ]; shortness of breath[ Y ]; paroxysmal nocturnal dyspnea[  ]; dyspnea on exertion[ Y ]; or orthopnea[  ];  GI:  gallstones[  ], vomiting[N  ];  dysphagia[  ]; melena[N ];  hematochezia [  ]; heartburn[  ];   Hx of  Colonoscopy[  ]; GU: kidney stones [  ]; hematuria[  ];   dysuria [  ];  nocturia[  ];  history of     obstruction [  ]; urinary frequency [  ]             Skin: rash, swelling[  ];, hair loss[  ];  peripheral edema[ N ];  or itching[  ]; Musculosketetal: myalgias[  ];  joint swelling[  ];  joint erythema[  ];  joint pain[  ];  back pain[  ];  Heme/Lymph: bruising[  ];  bleeding[  ];  anemia[ Y ];  Neuro: TIA[  ];  headaches[  ];  Stroke[ N  ];  vertigo[  ];  seizures[  ];   paresthesias[  ];  difficulty walking[  ];  Psych:depression[  ]; anxiety[  ];  Endocrine: diabetes[ Y ] uncontrolled;  thyroid dysfunction[ Y ];             Physical Exam: BP (!) 140/48   Pulse (!) 102   Temp 98.5 F (36.9 C) (  Oral)   Resp (!) 28   Ht 5' 3"  (1.6 m)   Wt 78 lb  4.2 oz (35.5 kg)   SpO2 93%   BMI 13.86 kg/m    General appearance: cooperative and no distress Resp: clear to auscultation bilaterally Cardio: sinus tachycardia Extremities: no edema, redness or tenderness in the calves or thighs Neurologic: Grossly normal  Diagnostic Studies & Laboratory data:  Result status: Final result                              *[facility]*                  *[facility] [employer].                        [place], [place] [address]                            [phone]  ------------------------------------------------------------------- Transthoracic Echocardiography  Patient:    Brittany Mercado, Brittany Mercado MR #:       [id number] Study Date: 07/10/2017 Gender:     F Age:        89 Height:     160 cm Weight:     35.5 kg BSA:        1.24 m^2 Pt. Status: Room:       Ideal, Anand D  ORDERING     Modena Jansky  PERFORMING   [facility], Inpatient  SONOGRAPHER  Darlina Sicilian, RDCS  cc:  -------------------------------------------------------------------  ------------------------------------------------------------------- Indications:      Pericardial effusion 423.9.  ------------------------------------------------------------------- History:   PMH:  Failure To Thrive. Lung Cancer. Murmur.  Risk factors:  Hypertension. Diabetes mellitus.  ------------------------------------------------------------------- Study Conclusions  - Pericardium, extracardiac: There is a moderate to large   pericardial effusion measuring 1.84 x 1.08cm at largest diameter.   There is RA inversion for > 50% of cardiac cycle and possible RV   diastolic collapse but difficult to discern because the RV   appears under filled. There is some respiratory variation in MV   inflow velocities with respirations. The IVC is normal size and   collapses normally with inspiration.  Impressions:  - compared to  prior echo, the effusion appears larger and now has   RA inversion and some respiratory variation with respirations.   Clinical correlation recommended.         Recent Radiology Findings:   US Abdomen Limited Ruq  Result Date: 07/11/2017 CLINICAL DATA:  Abnormal liver function tests. EXAM: ULTRASOUND ABDOMEN LIMITED RIGHT UPPER QUADRANT COMPARISON:  CT scan of May 21, 2017. FINDINGS: Gallbladder: No gallstones or wall thickening visualized. No sonographic Murphy sign noted by sonographer. Common bile duct: Diameter: 1 mm which is within normal limits. Liver: No focal lesion identified. Within normal limits in parenchymal echogenicity. Portal vein is patent on color Doppler imaging with normal direction of blood flow towards the liver. 4.9 x 3.4 x 1.9 cm hypoechoic mass is noted between right kidney and liver which corresponds to adrenal mass described on prior CT scan. IMPRESSION: 4.9 x 3.4 x 1.9 cm right adrenal mass is noted most consistent with metastatic disease. No other abnormality  seen in the right upper quadrant of the abdomen. Electronically Signed   By: Marijo Conception, M.D.   On: 07/11/2017 08:56     I have independently reviewed the above radiologic studies and discussed with the patient   Recent Lab Findings: Lab Results  Component Value Date   WBC 8.0 07/09/2017   HGB 10.8 (L) 07/09/2017   HCT 31.6 (L) 07/09/2017   PLT 275 07/09/2017   GLUCOSE 54 (L) 07/11/2017   CHOL 145 04/11/2017   TRIG 98 04/11/2017   HDL 41 04/11/2017   LDLCALC 84 04/11/2017   ALT 267 (H) 07/10/2017   AST 517 (H) 07/10/2017   NA 140 07/11/2017   K 3.6 07/11/2017   CL 112 (H) 07/11/2017   CREATININE 0.85 07/11/2017   BUN 19 07/11/2017   CO2 21 (L) 07/11/2017   TSH 13.110 (H) 07/10/2017   INR 1.08 07/07/2017   HGBA1C 8.8 (H) 05/09/2017      Assessment / Plan:      1. Pericardial Effusion-moderate to large pericardial effusion on Echo. First seen on CTA. Mild tachycardia of 109 and  stable BP 2. DKA and type II DM-DKA treated per protocol. Low-dose Lantus and SSI resumed. Recent CBGs range from 50-255.  3. AKI-creatinine 0.85. Replacing electrolytes as needed. Secondary to dehydration 4. RLL pneumonia-BC negative to date. On Levofloxacin 786m q 48 hours. Continue nebs 5. Severe malnutrition-Resumed home Megace and encouraged oral intake. On Ensure and Pro-stat sugar free. BMI 13.8 6. Anemia-multifactorial, continue to trend.  7. Hyporthyroidism-continue Synthroid. Last TSH was 13.1  8. Stage IV non-small cell lung cancer-Oncology following. Needs Palliative consult to discuss goals of care.  9. Plavix was discontinued and now she is on Heparin 5,000U q 8 hours.   Plan: Will discuss candidacy with Dr. VPrescott Gum She would certainly be high-risk to take to the OR. Discussed the procedure with the patient and family at the bedside. All questions were answered to their satisfaction.   I  spent 40 minutes counseling the patient face to face.   TNicholes Rough PA-C 07/11/2017 2:29 PM  Patient examined, echocardiogram image and CT scan of chest images personally reviewed.  Very frail and chronically ill-appearing female badly advanced lung cancer with a large right infrahilar mass and possible right lower lobe pneumonia.  Admitted for poorly controlled diabetes and shortness of breath.  CTA negative for pulmonary embolus showed a moderate pericardial effusion which was confirmed by echocardiogram.  Patient is at high risk for general anesthesia and surgery may not survive.  We will wait to speak with family tomorrow before surgery to make sure they understand the implications of this procedure.  Agree with above assessment and plan. PDahlia ByesMD

## 2017-07-11 NOTE — Consult Note (Signed)
   [facility] CM Inpatient Consult   07/11/2017  Brittany Mercado [birth year] [id number]    [facility] Care Management follow up.   Chart reviewed. Noted patient being evaluated for potential pericardial window. TCTS following.   Will continue to follow for disposition plans and refer to [facility] team as appropriate. Patient currently remains in stepdown unit at [facility].   Updated [facility] BSW.   Marthenia Rolling, MSN-Ed, RN,BSN [facility] Liaison [phone]

## 2017-07-11 NOTE — Progress Notes (Addendum)
Report called to Marolyn Hammock, RN ([facility]). Transport to be arranged. Family members aware.

## 2017-07-11 NOTE — Progress Notes (Addendum)
PROGRESS NOTE   Brittany Mercado  HDQ:[id number]    DOB: 1950-03-04    DOA: 07/06/2017  PCP: [name], DO   I have briefly reviewed patients previous medical records in [facility].  Brief Narrative:  67 year old female, lives with family, ambulates with the help of a walker, not on home oxygen, PMH of stage IV non-small cell lung cancer/adenocarcinoma currently on IV Ketruda/immunotherapy every 3 weeks (Dr. [name], oncology), poorly controlled DM 2/IDDM with frequent episodes of DKA referred to OP endocrinology by oncology, HTN, hypothyroid presented to ED 07/06/2017 due to nausea and not feeling well and admitted for DKA.  DKA resolved.  Treating for postobstructive pneumonia.  Course complicated by recurrent DKA on 5/15, now resolved.  Imaging shows moderate to large pericardial effusion without tamponade features.  Awaiting callback from primary  Oncologist to discuss possible palliative care consultation for goals of care.   Assessment & Plan:   Active Problems:   Diabetes mellitus type 2 in nonobese (HCC)   DKA (diabetic ketoacidoses) (HCC)   Hypokalemia   Anemia   Renal insufficiency   Shortness of breath   DKA and type II DM not at goal: A1c 05/09/2017: 8.8.  Labs on admission: Glucose 525, potassium 5.3, bicarbonate 8, creatinine 1.52 and anion gap >20).  Treated per DKA protocol and DKA had resolved. Holding metformin and Amaryl.  On 5/15, developed recurrent DKA (bicarbonate 14, glucose 336, creatinine 1.28, anion gap 17), briefly placed back on IV fluids and IV insulin drip, DKA resolved, developed transient hypoglycemia, now diet, low-dose Lantus and SSI resumed.  Monitor closely.  Acute kidney injury: Secondary to dehydration.  Metformin and meloxicam currently on hold.  This had resolved but recurred on 5/17/creatinine 1.28.  Resolved after IV fluids.  Hyperkalemia/hypokalemia: Hypokalemia replaced again on 5/15 and resolved.  Monitor  periodically.  Hypomagnesemia: Magnesium 1.3 on 5/15.  Replaced and 2.1 on 5/16.  Lobar postobstructive pneumonia (RLL): For tachycardia evaluation, patient underwent VQ scan which showed intermediate probability for PE.  Hence CTA chest obtained 07/08/2017: Negative for PE, stable right infrahilar mass with right lower lobe postobstructive pneumonia/pneumonitis.  Blood cultures negative to date.  MRSA PCR negative.  Started on oral levofloxacin 750 mg every 48 hours from 5/14.  Improving.  Her right-sided pleuritic chest pain may be related to underlying malignancy versus pneumonia and has had it for months.  Severe protein calorie malnutrition: Multifactorial related to stage IV lung cancer and multiple severe significant comorbidities.  Dietitian consulted and discussed with them.  Resumed home dose of Megace.  Encouraged oral intake.  Normocytic anemia/anemia of chronic disease versus iron deficiency: Stable.  Anemia panel results appreciated.  Follow CBCs periodically.  Elevated troponins: Flat trend.  No ischemic type of chest pain.  Likely demand ischemia from acute illness.  Moderate pericardial effusion: Noted on CTA chest.  No clinical tamponade.  Compared to TTE 5/12, repeat TTE on 5/15 suggested moderate to large pericardial effusion.  No clinical tamponade.  I discussed with on-call Cardiologist Dr. 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  Plan to discuss with primary oncologist.  Sinus tachycardia: May be multifactorial related to recent DKA, postobstructive pneumonia, anemia.  TSH is improved from 21.5 on 4/22 to 13/1 on 5/15, improving.  Options include continuing current dose and follow-up TSH in 4 to 6 weeks versus increasing  dose slightly.  Abnormal LFTs: AST and ALT were normal on 4/22 but elevated on admission.   Better the next day.  CTA chest showed visualized portion of liver is unremarkable.  LFTs unchanged over the last couple days.  RUQ ultrasound: Apart from known right adrenal metastatic mass, no other acute findings.  Continue to follow LFTs.  Stage IV non-small cell lung cancer/adenocarcinoma: Alerted oncology via [facility] and can follow-up outpatient.  Called oncologist office and awaiting callback.  Essential hypertension: Controlled.  Hypothyroid: Continue levothyroxine.  TSH 4/22 was markedly abnormal: 21.5.  Please see discussion above.  Acute respiratory failure with hypoxia: Secondary to lung cancer and postobstructive pneumonia.  Continue oxygen supplements while treating underlying cause.  Adult failure to thrive: Multifactorial.  Given her stage IV lung cancer, postobstructive pneumonia, possible malignant pericardial effusion, severe malnutrition, poor overall prognosis.  This was discussed in detail with patient's son on 5/15 and sister at bedside on 5/16.  Await discussion with oncology regarding palliative care consultation for goals of care.  Severe malnutrition in the context of chronic illness: Per RD.  Severe PAD: Aspirin continued.  Plavix held by TCTS for pericardial window 5/17.  Lower extremity venous Doppler: Noted following:  Left: Abnormalities consistent with the sequela of a prior venous obstructive process, with findings that appear chronic/long standing in nature are identified in the Peroneal veins. No cystic structure found in the popliteal fossa.   DVT prophylaxis: Subcutaneous heparin Code Status: Full Family Communication: Discussed in detail with patient's son on 5/15 and with sister at bedside on 5/16 Disposition: Continue care in stepdown unit pending TCTS input.  PT evaluated and recommend SNF.   Consultants:  None  Procedures:  None  Antimicrobials:  Oral levofloxacin 5/14 >   Subjective: Patient and sister at bedside indicate that patient felt  much better overnight.  Slept better.  No dyspnea.  Cough mostly dry but at times congested with mild creamy sputum.  Chronic right sided chest pain, pleuritic, has been going on for a couple of months.  Hungry this morning.  Constipation.  Had small BM yesterday.  Was n.p.o. overnight for RUQ ultrasound.  As per RN, no acute issues reported.  RN reported that insulin drip was discontinued about 2 AM due to hypoglycemia.  D5 infusion also apparently was stopped at the same time for unclear reasons and currently on normal saline infusion.  ROS: As above.  Objective:  Vitals:   07/11/17 0900 07/11/17 1000 07/11/17 1100 07/11/17 1200  BP:  (!) 139/42  (!) 98/42  Pulse: 81 87 93 (!) 102  Resp: 17 18 16 20   Temp:   98.5 F (36.9 C)   TempSrc:   Oral   SpO2: 95% 92% 96% 93%  Weight:      Height:        Examination:  General exam: Pleasant middle-aged female, small built, frail, cachectic and chronically ill looking lying comfortably propped up in bed.  Looks better than she did yesterday morning. Respiratory system: Reduced breath sounds in the right base with few crackles.  Rest of lung fields with harsh breath sounds. Respiratory effort normal.  No pleural rub appreciated. Cardiovascular system: S1 & S2 heard, RRR. No JVD, murmurs, rubs, gallops or clicks. No pedal edema.  Telemetry personally reviewed: Sinus rhythm in the 80s-90s. Gastrointestinal system: Abdomen is nondistended, soft and nontender. No organomegaly or masses felt. Normal bowel sounds heard.  Stable. Central nervous system: Alert and oriented. No focal neurological deficits.  Stable.  Extremities: Symmetric 5 x 5 power. Skin: No rashes, lesions or ulcers Psychiatry: Judgement and insight impaired. Mood & affect flat.     Data Reviewed: I have personally reviewed following labs and imaging studies  CBC: Recent Labs  Lab 07/06/17 1312 07/07/17 0702 07/09/17 0334  WBC 13.2*  --  8.0  NEUTROABS 9.9*  --   --   HGB  9.5*  --  10.8*  HCT 26.0* 27.0* 31.6*  MCV 80.0  --  80.8  PLT 319  --  371   Basic Metabolic Panel: Recent Labs  Lab 07/07/17 2129  07/09/17 2341 07/10/17 1027 07/10/17 1223 07/10/17 2056 07/11/17 0049 07/11/17 0506  NA 144  --  136  --  137 138 140 140  K 2.7*   < > 4.8  --  3.6 4.1 3.8 3.6  CL 111  --  107  --  106 110 111 112*  CO2 21*  --  20*  --  14* 20* 20* 21*  GLUCOSE 52*  --  231* 452* 336* 267* 94 54*  BUN 11  --  17  --  25* 22* 21* 19  CREATININE 0.81  --  0.91  --  1.28* 1.00 0.91 0.85  CALCIUM 8.1*  --  8.1*  --  8.4* 8.1* 7.9* 7.8*  MG 1.1*  --   --   --  1.3*  --   --  2.1   < > = values in this interval not displayed.   Liver Function Tests: Recent Labs  Lab 07/06/17 1312 07/07/17 0618 07/10/17 1223  AST 387* 234* 517*  ALT 283* 213* 267*  ALKPHOS 529* 423* 594*  BILITOT 1.8* 0.7 1.9*  PROT 7.7 6.1* 6.5  ALBUMIN 3.1* 2.5* 2.4*   Coagulation Profile: Recent Labs  Lab 07/07/17 0709  INR 1.08   Cardiac Enzymes: Recent Labs  Lab 07/06/17 1312 07/07/17 0618 07/07/17 1109 07/07/17 1758  TROPONINI <0.03 0.11* 0.09* 0.07*   HbA1C: No results for input(s): HGBA1C in the last 72 hours. CBG: Recent Labs  Lab 07/11/17 0525 07/11/17 0619 07/11/17 0650 07/11/17 0727 07/11/17 1122  GLUCAP 50* 54* 195* 132* 255*    Recent Results (from the past 240 hour(s))  Culture, blood (routine x 2)     Status: None   Collection Time: 07/06/17  6:40 PM  Result Value Ref Range Status   Specimen Description   Final    BLOOD RIGHT ANTECUBITAL Performed at [facility], [address] [place], [place] [address]    Special Requests   Final    BOTTLES DRAWN AEROBIC AND ANAEROBIC Blood Culture adequate volume Performed at [facility], [address] [place], [place] [address]    Culture   Final    NO GROWTH 5 DAYS Performed at [facility], [place] [address], [place], [place] [address]    Report Status 07/11/2017 FINAL   Final  MRSA PCR Screening     Status: None   Collection Time: 07/06/17  8:48 PM  Result Value Ref Range Status   MRSA by PCR NEGATIVE NEGATIVE Final    Comment:        The GeneXpert MRSA Assay (FDA approved for NASAL specimens only), is one component of a comprehensive MRSA colonization surveillance program. It is not intended to diagnose MRSA infection nor to guide or monitor treatment for MRSA infections. Performed at [facility], [place] [address] [place], [place] [address]  Radiology Studies: US Abdomen Limited Ruq  Result Date: 07/11/2017 CLINICAL DATA:  Abnormal liver function tests. EXAM: ULTRASOUND ABDOMEN LIMITED RIGHT UPPER QUADRANT COMPARISON:  CT scan of May 21, 2017. FINDINGS: Gallbladder: No gallstones or wall thickening visualized. No sonographic Murphy sign noted by sonographer. Common bile duct: Diameter: 1 mm which is within normal limits. Liver: No focal lesion identified. Within normal limits in parenchymal echogenicity. Portal vein is patent on color Doppler imaging with normal direction of blood flow towards the liver. 4.9 x 3.4 x 1.9 cm hypoechoic mass is noted between right kidney and liver which corresponds to adrenal mass described on prior CT scan. IMPRESSION: 4.9 x 3.4 x 1.9 cm right adrenal mass is noted most consistent with metastatic disease. No other abnormality seen in the right upper quadrant of the abdomen. Electronically Signed   By: Marijo Conception, M.D.   On: 07/11/2017 08:56        Scheduled Meds: . aspirin EC  81 mg Oral Daily  . clopidogrel  75 mg Oral Q breakfast  . feeding supplement (ENSURE ENLIVE)  237 mL Oral Q24H  . feeding supplement (PRO-STAT SUGAR FREE 64)  30 mL Oral BID  . ferrous sulfate  325 mg Oral Daily  . guaiFENesin  1,200 mg Oral BID  . heparin injection (subcutaneous)  5,000 Units Subcutaneous Q8H  . insulin aspart  0-5 Units Subcutaneous QHS  . insulin aspart  0-9 Units Subcutaneous TID  WC  . insulin glargine  7 Units Subcutaneous Daily  . levofloxacin  750 mg Oral Q48H  . levothyroxine  100 mcg Oral QAC breakfast  . loratadine  10 mg Oral Daily  . megestrol  800 mg Oral Daily  . multivitamin with minerals  1 tablet Oral Daily   Continuous Infusions: . sodium chloride Stopped (07/10/17 2359)     LOS: 5 days     Vernell Leep, MD, FACP, [facility]. [facility] Pager [phone]  If 7PM-7AM, please contact night-coverage [email or web address] Password [facility] 07/11/2017, 12:29 PM

## 2017-07-11 NOTE — Progress Notes (Addendum)
Addendum:  I discussed with patient's Oncologist Dr. 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  I discussed with and consulted Dr. Prescott Gum.  I discussed with TCTS team who advised transfer of patient to [facility] for possible pericardial window on 5/17.  Plavix has been discontinued by them.  Patient & son updated. Patient has questions re procedure and TCTS team to discuss with him.  Vernell Leep, MD, FACP, [facility]. [facility] Pager [phone]  If 7PM-7AM, please contact night-coverage [email or web address] Password [facility] 07/11/2017, 12:51 PM

## 2017-07-12 ENCOUNTER — Inpatient Hospital Stay (HOSPITAL_COMMUNITY): Payer: Medicare Other

## 2017-07-12 ENCOUNTER — Encounter (HOSPITAL_COMMUNITY)
Admission: EM | Disposition: A | Discharge status: Skilled Nursing Facility | Payer: Self-pay | Source: Home / Self Care | Attending: Internal Medicine

## 2017-07-12 ENCOUNTER — Inpatient Hospital Stay (HOSPITAL_COMMUNITY): Payer: Medicare Other | Admitting: Anesthesiology

## 2017-07-12 ENCOUNTER — Encounter (HOSPITAL_COMMUNITY): Payer: Self-pay | Admitting: Anesthesiology

## 2017-07-12 DIAGNOSIS — I313 Pericardial effusion (noninflammatory): Secondary | ICD-10-CM

## 2017-07-12 DIAGNOSIS — R945 Abnormal results of liver function studies: Secondary | ICD-10-CM

## 2017-07-12 DIAGNOSIS — I3131 Malignant pericardial effusion in diseases classified elsewhere: Secondary | ICD-10-CM | POA: Diagnosis present

## 2017-07-12 DIAGNOSIS — C801 Malignant (primary) neoplasm, unspecified: Secondary | ICD-10-CM | POA: Diagnosis present

## 2017-07-12 DIAGNOSIS — C3491 Malignant neoplasm of unspecified part of right bronchus or lung: Secondary | ICD-10-CM

## 2017-07-12 HISTORY — PX: SUBXYPHOID PERICARDIAL WINDOW: SHX5075

## 2017-07-12 LAB — MRSA PCR SCREENING: MRSA by PCR: NEGATIVE

## 2017-07-12 LAB — BLOOD GAS, ARTERIAL
ACID-BASE DEFICIT: 10 mmol/L — AB (ref 0.0–2.0)
Acid-base deficit: 2.4 mmol/L — ABNORMAL HIGH (ref 0.0–2.0)
BICARBONATE: 14.1 mmol/L — AB (ref 20.0–28.0)
Bicarbonate: 20.8 mmol/L (ref 20.0–28.0)
Drawn by: 270271
FIO2: 21
O2 Content: 4 L/min
O2 Saturation: 91.1 %
O2 Saturation: 94.8 %
PH ART: 7.391 (ref 7.350–7.450)
PO2 ART: 78.3 mmHg — AB (ref 83.0–108.0)
Patient temperature: 98.6
Patient temperature: 98.4
pCO2 arterial: 29.5 mmHg — ABNORMAL LOW (ref 32.0–48.0)
pCO2 arterial: 23.6 mmHg — ABNORMAL LOW (ref 32.0–48.0)
pH, Arterial: 7.462 — ABNORMAL HIGH (ref 7.350–7.450)
pO2, Arterial: 59.4 mmHg — ABNORMAL LOW (ref 83.0–108.0)

## 2017-07-12 LAB — POCT I-STAT 3, ART BLOOD GAS (G3+)
Acid-base deficit: 11 mmol/L — ABNORMAL HIGH (ref 0.0–2.0)
BICARBONATE: 12.4 mmol/L — AB (ref 20.0–28.0)
O2 Saturation: 98 %
PCO2 ART: 19.9 mmHg — AB (ref 32.0–48.0)
PH ART: 7.401 (ref 7.350–7.450)
TCO2: 13 mmol/L — AB (ref 22–32)
pO2, Arterial: 99 mmHg (ref 83.0–108.0)

## 2017-07-12 LAB — GLUCOSE, CAPILLARY
GLUCOSE-CAPILLARY: 207 mg/dL — AB (ref 65–99)
Glucose-Capillary: 55 mg/dL — ABNORMAL LOW (ref 65–99)
Glucose-Capillary: 119 mg/dL — ABNORMAL HIGH (ref 65–99)
Glucose-Capillary: 187 mg/dL — ABNORMAL HIGH (ref 65–99)
Glucose-Capillary: 304 mg/dL — ABNORMAL HIGH (ref 65–99)
Glucose-Capillary: 242 mg/dL — ABNORMAL HIGH (ref 65–99)

## 2017-07-12 LAB — CORTISOL-AM, BLOOD: Cortisol - AM: 5.7 ug/dL — ABNORMAL LOW (ref 6.7–22.6)

## 2017-07-12 SURGERY — CREATION, PERICARDIAL WINDOW, SUBXIPHOID APPROACH
Anesthesia: General

## 2017-07-12 MED ORDER — MEPERIDINE HCL 50 MG/ML IJ SOLN: 6.2500 mg | INTRAMUSCULAR | Status: DC | PRN

## 2017-07-12 MED ORDER — ALBUTEROL SULFATE (2.5 MG/3ML) 0.083% IN NEBU: 2.5000 mg | INHALATION_SOLUTION | RESPIRATORY_TRACT | Status: DC | PRN

## 2017-07-12 MED ORDER — FENTANYL CITRATE (PF) 100 MCG/2ML IJ SOLN: 25.0000 ug | INTRAMUSCULAR | Status: DC | PRN

## 2017-07-12 MED ORDER — PROPOFOL 10 MG/ML IV BOLUS
INTRAVENOUS | Status: DC | PRN
  Administered 2017-07-12: 50 mg via INTRAVENOUS

## 2017-07-12 MED ORDER — FENTANYL CITRATE (PF) 250 MCG/5ML IJ SOLN
INTRAMUSCULAR | Status: AC
  Filled 2017-07-12: qty 5

## 2017-07-12 MED ORDER — FENTANYL CITRATE (PF) 100 MCG/2ML IJ SOLN
INTRAMUSCULAR | Status: DC | PRN
  Administered 2017-07-12: 100 ug via INTRAVENOUS

## 2017-07-12 MED ORDER — VANCOMYCIN HCL IN DEXTROSE 1-5 GM/200ML-% IV SOLN
1000.0000 mg | Freq: Two times a day (BID) | INTRAVENOUS | Status: AC
  Administered 2017-07-13: 1000 mg via INTRAVENOUS
  Filled 2017-07-12: qty 200

## 2017-07-12 MED ORDER — SODIUM BICARBONATE 8.4 % IV SOLN
50.0000 meq | Freq: Once | INTRAVENOUS | Status: AC
  Administered 2017-07-12: 50 meq via INTRAVENOUS

## 2017-07-12 MED ORDER — OXYCODONE HCL 5 MG PO TABS
5.0000 mg | ORAL_TABLET | ORAL | Status: DC | PRN
  Administered 2017-07-14 – 2017-07-17 (×2): 5 mg via ORAL
  Filled 2017-07-12 (×3): qty 1

## 2017-07-12 MED ORDER — 0.9 % SODIUM CHLORIDE (POUR BTL) OPTIME
TOPICAL | Status: DC | PRN
  Administered 2017-07-12: 2000 mL

## 2017-07-12 MED ORDER — FERROUS SULFATE 325 (65 FE) MG PO TABS
325.0000 mg | ORAL_TABLET | Freq: Every day | ORAL | Status: DC
  Administered 2017-07-13 – 2017-07-18 (×6): 325 mg via ORAL
  Filled 2017-07-12 (×6): qty 1

## 2017-07-12 MED ORDER — ONDANSETRON HCL 4 MG/2ML IJ SOLN
4.0000 mg | Freq: Four times a day (QID) | INTRAMUSCULAR | Status: DC | PRN
  Administered 2017-07-14: 4 mg via INTRAVENOUS
  Filled 2017-07-12: qty 2

## 2017-07-12 MED ORDER — PHENYLEPHRINE HCL 10 MG/ML IJ SOLN
INTRAMUSCULAR | Status: DC | PRN
  Administered 2017-07-12: 40 ug via INTRAVENOUS
  Administered 2017-07-12: 80 ug via INTRAVENOUS

## 2017-07-12 MED ORDER — SUCCINYLCHOLINE CHLORIDE 20 MG/ML IJ SOLN
INTRAMUSCULAR | Status: DC | PRN
  Administered 2017-07-12: 50 mg via INTRAVENOUS

## 2017-07-12 MED ORDER — DEXAMETHASONE SODIUM PHOSPHATE 10 MG/ML IJ SOLN
INTRAMUSCULAR | Status: DC | PRN
  Administered 2017-07-12: 4 mg via INTRAVENOUS

## 2017-07-12 MED ORDER — ROCURONIUM BROMIDE 100 MG/10ML IV SOLN
INTRAVENOUS | Status: DC | PRN
  Administered 2017-07-12: 20 mg via INTRAVENOUS

## 2017-07-12 MED ORDER — SUGAMMADEX SODIUM 500 MG/5ML IV SOLN
INTRAVENOUS | Status: AC
Start: 2017-07-12 — End: ?
  Filled 2017-07-12: qty 5

## 2017-07-12 MED ORDER — SODIUM BICARBONATE 8.4 % IV SOLN
INTRAVENOUS | Status: AC
  Administered 2017-07-12: 100 meq via INTRAVENOUS
  Filled 2017-07-12: qty 100

## 2017-07-12 MED ORDER — NEOSTIGMINE METHYLSULFATE 10 MG/10ML IV SOLN
INTRAVENOUS | Status: DC | PRN
  Administered 2017-07-12: 2 mg via INTRAVENOUS

## 2017-07-12 MED ORDER — INSULIN ASPART 100 UNIT/ML ~~LOC~~ SOLN
0.0000 [IU] | SUBCUTANEOUS | Status: DC
  Administered 2017-07-12: 8 [IU] via SUBCUTANEOUS
  Administered 2017-07-12: 16 [IU] via SUBCUTANEOUS
  Administered 2017-07-13: 2 [IU] via SUBCUTANEOUS
  Administered 2017-07-13: 4 [IU] via SUBCUTANEOUS
  Administered 2017-07-13: 8 [IU] via SUBCUTANEOUS

## 2017-07-12 MED ORDER — MEGESTROL ACETATE 400 MG/10ML PO SUSP
800.0000 mg | Freq: Every day | ORAL | Status: DC
  Administered 2017-07-13 – 2017-07-18 (×5): 800 mg via ORAL
  Filled 2017-07-12 (×6): qty 20

## 2017-07-12 MED ORDER — TRAMADOL HCL 50 MG PO TABS
50.0000 mg | ORAL_TABLET | Freq: Four times a day (QID) | ORAL | Status: DC | PRN
  Administered 2017-07-17: 50 mg via ORAL
  Filled 2017-07-12: qty 1

## 2017-07-12 MED ORDER — ADULT MULTIVITAMIN W/MINERALS CH
1.0000 | ORAL_TABLET | Freq: Every day | ORAL | Status: DC
  Administered 2017-07-13 – 2017-07-18 (×5): 1 via ORAL
  Filled 2017-07-12 (×6): qty 1

## 2017-07-12 MED ORDER — POTASSIUM CHLORIDE 10 MEQ/50ML IV SOLN
10.0000 meq | Freq: Every day | INTRAVENOUS | Status: DC | PRN
  Filled 2017-07-12 (×3): qty 50

## 2017-07-12 MED ORDER — BISACODYL 5 MG PO TBEC
10.0000 mg | DELAYED_RELEASE_TABLET | Freq: Every day | ORAL | Status: DC
  Administered 2017-07-13 – 2017-07-17 (×4): 10 mg via ORAL
  Filled 2017-07-12 (×5): qty 2

## 2017-07-12 MED ORDER — ACETAMINOPHEN 160 MG/5ML PO SOLN: 1000.0000 mg | Freq: Four times a day (QID) | ORAL | Status: DC

## 2017-07-12 MED ORDER — LACTATED RINGERS IV SOLN
INTRAVENOUS | Status: DC
  Administered 2017-07-12: 14:00:00 via INTRAVENOUS

## 2017-07-12 MED ORDER — FENTANYL CITRATE (PF) 100 MCG/2ML IJ SOLN
INTRAMUSCULAR | Status: AC
  Filled 2017-07-12: qty 2

## 2017-07-12 MED ORDER — ALBUMIN HUMAN 5 % IV SOLN
12.5000 g | Freq: Once | INTRAVENOUS | Status: AC
  Administered 2017-07-12: 12.5 g via INTRAVENOUS

## 2017-07-12 MED ORDER — GUAIFENESIN ER 600 MG PO TB12
1200.0000 mg | ORAL_TABLET | Freq: Two times a day (BID) | ORAL | Status: DC
  Administered 2017-07-13 – 2017-07-18 (×10): 1200 mg via ORAL
  Filled 2017-07-12 (×12): qty 2

## 2017-07-12 MED ORDER — LIDOCAINE HCL (CARDIAC) PF 100 MG/5ML IV SOSY
PREFILLED_SYRINGE | INTRAVENOUS | Status: DC | PRN
  Administered 2017-07-12: 50 mg via INTRAVENOUS

## 2017-07-12 MED ORDER — INSULIN GLARGINE 100 UNIT/ML ~~LOC~~ SOLN
5.0000 [IU] | Freq: Two times a day (BID) | SUBCUTANEOUS | Status: DC
  Filled 2017-07-12: qty 0.05

## 2017-07-12 MED ORDER — SUGAMMADEX SODIUM 500 MG/5ML IV SOLN
INTRAVENOUS | Status: AC
  Filled 2017-07-12: qty 5

## 2017-07-12 MED ORDER — SODIUM CHLORIDE 0.9 % IV SOLN
INTRAVENOUS | Status: DC
  Administered 2017-07-12 – 2017-07-14 (×3): via INTRAVENOUS

## 2017-07-12 MED ORDER — SODIUM BICARBONATE 8.4 % IV SOLN
100.0000 meq | Freq: Once | INTRAVENOUS | Status: AC
  Administered 2017-07-12: 100 meq via INTRAVENOUS

## 2017-07-12 MED ORDER — ACETAMINOPHEN 500 MG PO TABS
1000.0000 mg | ORAL_TABLET | Freq: Four times a day (QID) | ORAL | Status: DC
  Administered 2017-07-12 – 2017-07-15 (×8): 1000 mg via ORAL
  Filled 2017-07-12 (×7): qty 2

## 2017-07-12 MED ORDER — MIDAZOLAM HCL 2 MG/2ML IJ SOLN
INTRAMUSCULAR | Status: AC
  Filled 2017-07-12: qty 2

## 2017-07-12 MED ORDER — PRO-STAT SUGAR FREE PO LIQD
30.0000 mL | Freq: Two times a day (BID) | ORAL | Status: DC
  Administered 2017-07-13 – 2017-07-15 (×4): 30 mL via ORAL
  Filled 2017-07-12 (×9): qty 30

## 2017-07-12 MED ORDER — ENSURE ENLIVE PO LIQD: 237.0000 mL | ORAL | Status: DC

## 2017-07-12 MED ORDER — PHENYLEPHRINE HCL 10 MG/ML IJ SOLN
0.0000 ug/min | INTRAMUSCULAR | Status: DC
  Administered 2017-07-12 – 2017-07-13 (×2): 20 ug/min via INTRAVENOUS
  Filled 2017-07-12 (×3): qty 1

## 2017-07-12 MED ORDER — SENNOSIDES-DOCUSATE SODIUM 8.6-50 MG PO TABS
1.0000 | ORAL_TABLET | Freq: Every day | ORAL | Status: DC
  Administered 2017-07-13 – 2017-07-16 (×4): 1 via ORAL
  Filled 2017-07-12 (×5): qty 1

## 2017-07-12 MED ORDER — DEXTROSE 50 % IV SOLN
INTRAVENOUS | Status: AC
  Filled 2017-07-12: qty 50

## 2017-07-12 MED ORDER — PROMETHAZINE HCL 25 MG/ML IJ SOLN: 6.2500 mg | INTRAMUSCULAR | Status: DC | PRN

## 2017-07-12 MED ORDER — ASPIRIN EC 81 MG PO TBEC
81.0000 mg | DELAYED_RELEASE_TABLET | Freq: Every day | ORAL | Status: DC
  Administered 2017-07-13 – 2017-07-18 (×6): 81 mg via ORAL
  Filled 2017-07-12 (×6): qty 1

## 2017-07-12 MED ORDER — HYDROCORTISONE NA SUCCINATE PF 100 MG IJ SOLR
50.0000 mg | Freq: Four times a day (QID) | INTRAMUSCULAR | Status: DC
  Administered 2017-07-12 – 2017-07-13 (×3): 50 mg via INTRAVENOUS
  Filled 2017-07-12 (×3): qty 2

## 2017-07-12 MED ORDER — PROPOFOL 10 MG/ML IV BOLUS
INTRAVENOUS | Status: AC
  Filled 2017-07-12: qty 20

## 2017-07-12 MED ORDER — GLYCOPYRROLATE 0.2 MG/ML IJ SOLN
INTRAMUSCULAR | Status: DC | PRN
  Administered 2017-07-12: 0.1 mg via INTRAVENOUS

## 2017-07-12 MED ORDER — LACTATED RINGERS IV SOLN
INTRAVENOUS | Status: DC | PRN
  Administered 2017-07-12: 15:00:00 via INTRAVENOUS

## 2017-07-12 MED ORDER — LEVOTHYROXINE SODIUM 100 MCG PO TABS
100.0000 ug | ORAL_TABLET | Freq: Every day | ORAL | Status: DC
  Administered 2017-07-13 – 2017-07-18 (×6): 100 ug via ORAL
  Filled 2017-07-12 (×6): qty 1

## 2017-07-12 MED ORDER — SODIUM BICARBONATE 8.4 % IV SOLN
INTRAVENOUS | Status: AC
  Administered 2017-07-12: 50 meq via INTRAVENOUS
  Filled 2017-07-12: qty 50

## 2017-07-12 MED ORDER — LACTATED RINGERS IV SOLN: INTRAVENOUS | Status: DC

## 2017-07-12 SURGICAL SUPPLY — 57 items
BENZOIN TINCTURE PRP APPL 2/3 (GAUZE/BANDAGES/DRESSINGS) IMPLANT
CANISTER SUCT 3000ML PPV (MISCELLANEOUS) ×3 IMPLANT
CATH THORACIC 28FR (CATHETERS) IMPLANT
CATH THORACIC 28FR RT ANG (CATHETERS) IMPLANT
CATH THORACIC 36FR (CATHETERS) IMPLANT
CATH THORACIC 36FR RT ANG (CATHETERS) IMPLANT
CLOSURE WOUND 1/2 X4 (GAUZE/BANDAGES/DRESSINGS)
CONN ST 1/4X3/8  BEN (MISCELLANEOUS) ×2
CONN ST 1/4X3/8 BEN (MISCELLANEOUS) ×1 IMPLANT
CONT SPEC 4OZ CLIKSEAL STRL BL (MISCELLANEOUS) IMPLANT
COVER SURGICAL LIGHT HANDLE (MISCELLANEOUS) IMPLANT
DRAIN CHANNEL 28F RND 3/8 FF (WOUND CARE) ×3 IMPLANT
DRAPE HALF SHEET 40X57 (DRAPES) IMPLANT
DRAPE LAPAROSCOPIC ABDOMINAL (DRAPES) ×3 IMPLANT
ELECT BLADE 4.0 EZ CLEAN MEGAD (MISCELLANEOUS) ×3
ELECT REM PT RETURN 9FT ADLT (ELECTROSURGICAL) ×3
ELECTRODE BLDE 4.0 EZ CLN MEGD (MISCELLANEOUS) ×1 IMPLANT
ELECTRODE REM PT RTRN 9FT ADLT (ELECTROSURGICAL) ×1 IMPLANT
GAUZE SPONGE 4X4 12PLY STRL (GAUZE/BANDAGES/DRESSINGS) ×3 IMPLANT
GLOVE BIO SURGEON STRL SZ7.5 (GLOVE) ×9 IMPLANT
GLOVE BIOGEL PI IND STRL 8 (GLOVE) ×1 IMPLANT
GLOVE BIOGEL PI INDICATOR 8 (GLOVE) ×2
GLOVE ECLIPSE 8.0 STRL XLNG CF (GLOVE) ×3 IMPLANT
GLOVE SURG SS PI 6.0 STRL IVOR (GLOVE) ×3 IMPLANT
GOWN STRL REUS W/ TWL LRG LVL4 (GOWN DISPOSABLE) ×2 IMPLANT
GOWN STRL REUS W/TWL 2XL LVL3 (GOWN DISPOSABLE) ×3 IMPLANT
GOWN STRL REUS W/TWL LRG LVL4 (GOWN DISPOSABLE) ×4
HEMOSTAT POWDER SURGIFOAM 1G (HEMOSTASIS) IMPLANT
KIT BASIN OR (CUSTOM PROCEDURE TRAY) ×3 IMPLANT
KIT SUCTION CATH 14FR (SUCTIONS) ×3 IMPLANT
KIT TURNOVER KIT B (KITS) ×3 IMPLANT
NS IRRIG 1000ML POUR BTL (IV SOLUTION) ×6 IMPLANT
PACK CHEST (CUSTOM PROCEDURE TRAY) ×3 IMPLANT
PAD ARMBOARD 7.5X6 YLW CONV (MISCELLANEOUS) ×6 IMPLANT
PAD ELECT DEFIB RADIOL ZOLL (MISCELLANEOUS) ×3 IMPLANT
STRIP CLOSURE SKIN 1/2X4 (GAUZE/BANDAGES/DRESSINGS) IMPLANT
SUT SILK  1 MH (SUTURE) ×2
SUT SILK 1 MH (SUTURE) ×1 IMPLANT
SUT SILK 2 0 SH CR/8 (SUTURE) ×3 IMPLANT
SUT VIC AB 1 CTX 18 (SUTURE) ×6 IMPLANT
SUT VIC AB 1 CTX 36 (SUTURE) ×2
SUT VIC AB 1 CTX36XBRD ANBCTR (SUTURE) ×1 IMPLANT
SUT VIC AB 2-0 CT1 27 (SUTURE) ×2
SUT VIC AB 2-0 CT1 TAPERPNT 27 (SUTURE) ×1 IMPLANT
SUT VIC AB 3-0 X1 27 (SUTURE) ×6 IMPLANT
SWAB COLLECTION DEVICE MRSA (MISCELLANEOUS) ×3 IMPLANT
SWAB CULTURE ESWAB REG 1ML (MISCELLANEOUS) ×3 IMPLANT
SYR 10ML LL (SYRINGE) IMPLANT
SYR 50ML SLIP (SYRINGE) IMPLANT
SYSTEM SAHARA CHEST DRAIN ATS (WOUND CARE) ×3 IMPLANT
TAPE CLOTH SURG 4X10 WHT LF (GAUZE/BANDAGES/DRESSINGS) ×3 IMPLANT
TOWEL GREEN STERILE (TOWEL DISPOSABLE) ×3 IMPLANT
TOWEL GREEN STERILE FF (TOWEL DISPOSABLE) ×3 IMPLANT
TRAP SPECIMEN MUCOUS 40CC (MISCELLANEOUS) ×6 IMPLANT
TRAY FOLEY MTR SLVR 14FR STAT (SET/KITS/TRAYS/PACK) IMPLANT
TRAY FOLEY SILVER 16FR TEMP (SET/KITS/TRAYS/PACK) IMPLANT
WATER STERILE IRR 1000ML POUR (IV SOLUTION) ×6 IMPLANT

## 2017-07-12 NOTE — Anesthesia Procedure Notes (Signed)
Procedure Name: Intubation Date/Time: 07/12/2017 4:04 PM Performed by: Eligha Bridegroom, CRNA Pre-anesthesia Checklist: Patient identified, Emergency Drugs available, Suction available, Patient being monitored and Timeout performed Patient Re-evaluated:Patient Re-evaluated prior to induction Oxygen Delivery Method: Circle system utilized Induction Type: IV induction Ventilation: Mask ventilation without difficulty Laryngoscope Size: Mac and 4 Grade View: Grade II Tube type: Oral Tube size: 7.5 mm Placement Confirmation: ETT inserted through vocal cords under direct vision,  positive ETCO2 and breath sounds checked- equal and bilateral Secured at: 21 cm Tube secured with: Tape Dental Injury: Teeth and Oropharynx as per pre-operative assessment

## 2017-07-12 NOTE — Progress Notes (Signed)
OT Cancellation Note  Patient Details Name: Brittany Mercado MRN: [id number] DOB: 08/04/1950   Cancelled Treatment:    Reason Eval/Treat Not Completed: Patient not medically ready.  Pt awaiting cardiac window.  [name], OTR/L [phone]   Lucille Passy M 07/12/2017, 1:12 PM

## 2017-07-12 NOTE — Brief Op Note (Signed)
07/12/2017  5:32 PM  PATIENT:  Brittany Mercado  67 y.o. female  PRE-OPERATIVE DIAGNOSIS:  pericardial effusion, hx lung cancer  POST-OPERATIVE DIAGNOSIS:  pericardial effusion, hx lung cancer  PROCEDURE:  Procedure(s): SUBXYPHOID PERICARDIAL WINDOW (N/A) Drainage 500 ml fluid Excisional biopsy of pericardial tissue  SURGEON:  Surgeon(s) and Role:    Ivin Poot, MD - Primary  PHYSICIAN ASSISTANT:   ASSISTANTS: Joellyn Rued PA   ANESTHESIA:   general  EBL: 10 ml  BLOOD ADMINISTERED:none  DRAINS: (22F) Blake drain(s) in the pericardial space   LOCAL MEDICATIONS USED:  NONE  SPECIMEN:  Excision  DISPOSITION OF SPECIMEN:  PATHOLOGY  COUNTS:  YES  TOURNIQUET:  * No tourniquets in log *  DICTATION: .Dragon Dictation  PLAN OF CARE: transfer to ICU 2 H  PATIENT DISPOSITION:  PACU - hemodynamically stable.   Delay start of Pharmacological VTE agent (>24hrs) due to surgical blood loss or risk of bleeding: yes- hold plavix, heparin until pericardial drain is out

## 2017-07-12 NOTE — Anesthesia Procedure Notes (Signed)
Arterial Line Insertion Start/End5/17/2019 3:15 PM, 07/12/2017 3:20 PM Performed by: Effie Berkshire, MD, anesthesiologist  Patient location: Pre-op. Preanesthetic checklist: patient identified, IV checked, site marked, risks and benefits discussed, surgical consent, monitors and equipment checked, pre-op evaluation, timeout performed and anesthesia consent Lidocaine 1% used for infiltration radial was placed Catheter size: 20 Fr Hand hygiene performed  and maximum sterile barriers used   Attempts: 1 Procedure performed without using ultrasound guided technique. Following insertion, dressing applied and Biopatch. Post procedure assessment: normal and unchanged  Patient tolerated the procedure well with no immediate complications.

## 2017-07-12 NOTE — Progress Notes (Signed)
[name] - Stepdown/ICU TEAM  Brittany Mercado  MWN:[id number] DOB: [birth year] DOA: 07/06/2017 PCP: [name], DO    Brief Narrative:  67 year old female w/ a hx of stage IV non-small cell lung cancer/adenocarcinoma currently on IV Ketruda/immunotherapy every 3 weeks (Dr. [name]), poorly controlled DM2 with frequent episodes of DKA, HTN, and hypothyroidism who presented to the ED 07/06/2017 due to nausea and was found to be in DKA.  Her DKA initially resolved, but then she suffered recurrent DKA on 5/15.  She was also found to have a  postobstructive pneumonia, and a large pericardial effusion.  Subjective: The patient is resting comfortably in bed.  She denies chest pain nausea vomiting or abdominal pain.  She appears somewhat anxious and slightly dyspneic.  She denies feeling short of breath but does report that her breathing has been "a little short" since she first presented to the hospital.  She tells me she is anxious to proceed with her pericardial window today.  Assessment & Plan:  Moderate pericardial effusion Noted on CTA chest - compared to TTE 5/12 repeat TTE 5/15 suggested moderate to large pericardial effusion - for pericardial window this morning   Acute respiratory failure with hypoxia Secondary to lung cancer and postobstructive pneumonia  DKA in DM2 A1c 3/14 was 8.8 - holding metformin and Amaryl - on 5/15 developed recurrent DKA - CBG quite variable w/ some hypoglycemia this AM in setting of NPO - follow   Acute kidney injury secondary to dehydration - resolved w/ volume expansion   Lobar postobstructive pneumonia CTA chest 5/13 noted right lower lobe postobstructive pneumonia/pneumonitis - cont abx coverage   Severe protein calorie malnutrition resumed home Megace  Normocytic anemia/anemia of chronic disease  Hgb stable  Elevated troponins Flat trend - no ischemic type chest pain - likely demand ischemia from acute  illness  Hypothyroidism TSH improved from 21.5 on 4/22 to 13.1 on 5/15 - follow w/o change for now   Abnormal LFTs No liver lesions noted on CT abdom March 2019 - follow LFTs   Stage IV non-small cell lung cancer/adenocarcinoma Care per Dr. Inda Merlin   HTN BP well controlled  Adult failure to thrive - Severe malnutrition in the context of chronic illness stage IV lung cancer, postobstructive pneumonia, possible malignant pericardial effusion, severe malnutrition  Severe PAD Aspirin continued - Plavix held by TCTS for pericardial window 5/17  DVT prophylaxis: SQ heparin  Code Status: FULL CODE Family Communication: spoke w/ pt and family at bedside   Disposition Plan: SDU - to OR today   Consultants:  TCTS  Antimicrobials:  levaquin 5/14 >  Objective: Blood pressure (!) 126/59, pulse 92, temperature 98.3 F (36.8 C), temperature source Oral, resp. rate (!) 21, height 5\' 3"  (1.6 m), weight 38 kg (83 lb 12.4 oz), SpO2 100 %.  Intake/Output Summary (Last 24 hours) at 07/12/2017 0913 Last data filed at 07/12/2017 0700 Gross per 24 hour  Intake 841.25 ml  Output 350 ml  Net 491.25 ml   Filed Weights   07/06/17 1216 07/06/17 2053 07/11/17 1758  Weight: 37.2 kg (82 lb) 35.5 kg (78 lb 4.2 oz) 38 kg (83 lb 12.4 oz)    Examination: General: No acute respiratory distress Lungs: Clear to auscultation bilaterally without wheezes or crackles Cardiovascular: Regular rate and rhythm without murmur gallop or rub normal S1 and S2 Abdomen: Nontender, nondistended, soft, bowel sounds positive, no rebound, no ascites, no appreciable mass Extremities: No significant cyanosis, clubbing, or edema bilateral lower  extremities  CBC: Recent Labs  Lab 07/06/17 1312 07/07/17 0702 07/09/17 0334 07/11/17 1950  WBC 13.2*  --  8.0 7.0  NEUTROABS 9.9*  --   --   --   HGB 9.5*  --  10.8* 9.0*  HCT 26.0* 27.0* 31.6* 26.6*  MCV 80.0  --  80.8 82.1  PLT 319  --  275 875   Basic  Metabolic Panel: Recent Labs  Lab 07/07/17 2129  07/10/17 1223  07/11/17 0049 07/11/17 0506 07/11/17 1950  NA 144   < > 137   < > 140 140 139  K 2.7*   < > 3.6   < > 3.8 3.6 3.8  CL 111   < > 106   < > 111 112* 111  CO2 21*   < > 14*   < > 20* 21* 19*  GLUCOSE 52*   < > 336*   < > 94 54* 100*  BUN 11   < > 25*   < > 21* 19 8  CREATININE 0.81   < > 1.28*   < > 0.91 0.85 0.90  CALCIUM 8.1*   < > 8.4*   < > 7.9* 7.8* 7.6*  MG 1.1*  --  1.3*  --   --  2.1  --    < > = values in this interval not displayed.   GFR: Estimated Creatinine Clearance: 36.9 mL/min (by C-G formula based on SCr of 0.9 mg/dL).  Liver Function Tests: Recent Labs  Lab 07/06/17 1312 07/07/17 0618 07/10/17 1223 07/11/17 1950  AST 387* 234* 517* 631*  ALT 283* 213* 267* 255*  ALKPHOS 529* 423* 594* 752*  BILITOT 1.8* 0.7 1.9* 0.8  PROT 7.7 6.1* 6.5 5.9*  ALBUMIN 3.1* 2.5* 2.4* 2.2*    Coagulation Profile: Recent Labs  Lab 07/07/17 0709 07/11/17 1950  INR 1.08 1.11    Cardiac Enzymes: Recent Labs  Lab 07/06/17 1312 07/07/17 0618 07/07/17 1109 07/07/17 1758  TROPONINI <0.03 0.11* 0.09* 0.07*    HbA1C: Hgb A1c MFr Bld  Date/Time Value Ref Range Status  05/09/2017 08:06 AM 8.8 (H) 4.8 - 5.6 % Final    Comment:    (NOTE) Pre diabetes:          5.7%-6.4% Diabetes:              >6.4% Glycemic control for   <7.0% adults with diabetes   05/08/2017 11:08 AM 8.8 (H) 4.8 - 5.6 % Final    Comment:    (NOTE) Pre diabetes:          5.7%-6.4% Diabetes:              >6.4% Glycemic control for   <7.0% adults with diabetes     CBG: Recent Labs  Lab 07/11/17 1122 07/11/17 1501 07/11/17 2107 07/12/17 0822 07/12/17 0851  GLUCAP 255* 213* 109* 55* 119*    Recent Results (from the past 240 hour(s))  Culture, blood (routine x 2)     Status: None   Collection Time: 07/06/17  6:40 PM  Result Value Ref Range Status   Specimen Description   Final    BLOOD RIGHT ANTECUBITAL Performed at [facility], [address] [place], [place] [address]    Special Requests   Final    BOTTLES DRAWN AEROBIC AND ANAEROBIC Blood Culture adequate volume Performed at [facility], [address] [place], [place] [address]    Culture   Final  NO GROWTH 5 DAYS Performed at [facility], [place] [address] [place], [place] [address]    Report Status 07/11/2017 FINAL  Final  MRSA PCR Screening     Status: None   Collection Time: 07/06/17  8:48 PM  Result Value Ref Range Status   MRSA by PCR NEGATIVE NEGATIVE Final    Comment:        The GeneXpert MRSA Assay (FDA approved for NASAL specimens only), is one component of a comprehensive MRSA colonization surveillance program. It is not intended to diagnose MRSA infection nor to guide or monitor treatment for MRSA infections. Performed at [facility], [place] [address], [place], [place] [address]      Scheduled Meds: . aspirin EC  81 mg Oral Daily  . feeding supplement (ENSURE ENLIVE)  237 mL Oral Q24H  . feeding supplement (PRO-STAT SUGAR FREE 64)  30 mL Oral BID  . ferrous sulfate  325 mg Oral Daily  . guaiFENesin  1,200 mg Oral BID  . heparin injection (subcutaneous)  5,000 Units Subcutaneous Q8H  . hydrocortisone sodium succinate  50 mg Intravenous Q6H  . insulin aspart  0-5 Units Subcutaneous QHS  . insulin aspart  0-9 Units Subcutaneous TID WC  . insulin glargine  7 Units Subcutaneous BID  . levofloxacin  750 mg Oral Q48H  . levothyroxine  100 mcg Oral QAC breakfast  . loratadine  10 mg Oral Daily  . megestrol  800 mg Oral Daily  . multivitamin with minerals  1 tablet Oral Daily   Continuous Infusions: . sodium chloride 75 mL/hr at 07/12/17 0622  . vancomycin       LOS: 6 days   Cherene Altes, MD [facility] Office  [phone] Pager - Text Page per Amion as per below:  On-Call/Text Page:      [email or web address]      password [email or web address]  If 7PM-7AM, please contact  night-coverage [email or web address] Password [facility] 07/12/2017, 9:13 AM

## 2017-07-12 NOTE — Anesthesia Preprocedure Evaluation (Addendum)
Anesthesia Evaluation  Patient identified by MRN, date of birth, ID band Patient awake    Reviewed: Allergy & Precautions, NPO status , Patient's Chart, lab work & pertinent test results  Airway Mallampati: I  TM Distance: >3 FB Neck ROM: Full    Dental  (+) Poor Dentition, Edentulous Upper   Pulmonary former smoker,     + decreased breath sounds      Cardiovascular hypertension,  Rhythm:Regular Rate:Tachycardia     Neuro/Psych negative neurological ROS  negative psych ROS   GI/Hepatic Neg liver ROS, GERD  Medicated,  Endo/Other  diabetes, Type 2, Oral Hypoglycemic Agents, Insulin DependentHypothyroidism   Renal/GU      Musculoskeletal negative musculoskeletal ROS (+)   Abdominal Normal abdominal exam  (+)   Peds  Hematology   Anesthesia Other Findings - Lung CA (Chemo/radiation)  Reproductive/Obstetrics                            Lab Results  Component Value Date   WBC 7.0 07/11/2017   HGB 9.0 (L) 07/11/2017   HCT 26.6 (L) 07/11/2017   MCV 82.1 07/11/2017   PLT 260 07/11/2017   Lab Results  Component Value Date   CREATININE 0.90 07/11/2017   BUN 8 07/11/2017   NA 139 07/11/2017   K 3.8 07/11/2017   CL 111 07/11/2017   CO2 19 (L) 07/11/2017   Lab Results  Component Value Date   INR 1.11 07/11/2017   INR 1.08 07/07/2017   INR 0.93 04/08/2017   Echo:  Pericardium, extracardiac: There is a moderate to large pericardial effusion measuring 1.84 x 1.08cm at largest diameter. There is RA inversion for > 50% of cardiac cycle and possible RV diastolic collapse but difficult to discern because the RV appears under filled. There is some respiratory variation in MV inflow velocities with respirations. The IVC is normal size and collapses normally with inspiration.  Echo: - Left ventricle: The cavity size was normal. Systolic function was normal. The estimated ejection fraction was in  the range of 55% to 60%. Wall motion was normal; there were no regional wall motion abnormalities. Left ventricular diastolic function   parameters were normal. - Aortic valve: There was trivial regurgitation. - Atrial septum: No defect or patent foramen ovale was identified. - Pericardium, extracardiac: Small to moderate peridardial effusion no tamponade IVC small and good respiratory variation  Anesthesia Physical Anesthesia Plan  ASA: IV  Anesthesia Plan: General   Post-op Pain Management:    Induction: Intravenous  PONV Risk Score and Plan: 4 or greater and Ondansetron, Midazolam and Treatment may vary due to age or medical condition  Airway Management Planned: Oral ETT  Additional Equipment: CVP, Arterial line and TEE  Intra-op Plan:   Post-operative Plan: Extubation in OR  Informed Consent: I have reviewed the patients History and Physical, chart, labs and discussed the procedure including the risks, benefits and alternatives for the proposed anesthesia with the patient or authorized representative who has indicated his/her understanding and acceptance.   Dental advisory given  Plan Discussed with: CRNA  Anesthesia Plan Comments:        Anesthesia Quick Evaluation

## 2017-07-12 NOTE — Progress Notes (Signed)
Hypoglycemic Event  CBG: 55  Treatment: 25 ml dextrose  Symptoms: pt not symptomatic  Follow-up CBG: Time: 0851 CBG Result: 119  Possible Reasons for Event: patient NPO for surgery  Comments/MD notified: Thereasa Solo, MD notified   Basil Dess, RN

## 2017-07-12 NOTE — Transfer of Care (Signed)
Immediate Anesthesia Transfer of Care Note  Patient: Brittany Mercado  Procedure(s) Performed: SUBXYPHOID PERICARDIAL WINDOW (N/A )  Patient Location: PACU  Anesthesia Type:General  Level of Consciousness: awake and alert   Airway & Oxygen Therapy: Patient Spontanous Breathing and Patient connected to nasal cannula oxygen  Post-op Assessment: Report given to RN and Post -op Vital signs reviewed and stable  Post vital signs: Reviewed and stable  Last Vitals:  Vitals Value Taken Time  BP 106/46 07/12/2017  7:49 PM  Temp    Pulse 107 07/12/2017  7:46 PM  Resp 25 07/12/2017  7:47 PM  SpO2 94 % 07/12/2017  7:46 PM  Vitals shown include unvalidated device data.  Last Pain:  Vitals:   07/12/17 1919  TempSrc:   PainSc: 0-No pain      Patients Stated Pain Goal: 0 (14/60/47 9987)  Complications: No apparent anesthesia complications

## 2017-07-12 NOTE — Progress Notes (Signed)
Dr. Prescott Gum by to see patient. BP low, 50V-57B systolic. MD ordered albumin and a neo drip. Will give these and continue to monitor patient status.

## 2017-07-12 NOTE — Progress Notes (Signed)
Pre Procedure note for inpatients:   Taviana Westergren has been scheduled for Procedure(s): SUBXYPHOID PERICARDIAL WINDOW (N/A) TRANSESOPHAGEAL ECHOCARDIOGRAM (TEE) (N/A) today. The various methods of treatment have been discussed with the patient. After consideration of the risks, benefits and treatment options the patient has consented to the planned procedure.   The patient has been seen and labs reviewed. There are no changes in the patient's condition to prevent proceeding with the planned procedure today.  Recent labs:  Lab Results  Component Value Date   WBC 7.0 07/11/2017   HGB 9.0 (L) 07/11/2017   HCT 26.6 (L) 07/11/2017   PLT 260 07/11/2017   GLUCOSE 100 (H) 07/11/2017   CHOL 145 04/11/2017   TRIG 98 04/11/2017   HDL 41 04/11/2017   LDLCALC 84 04/11/2017   ALT 255 (H) 07/11/2017   AST 631 (H) 07/11/2017   NA 139 07/11/2017   K 3.8 07/11/2017   CL 111 07/11/2017   CREATININE 0.90 07/11/2017   BUN 8 07/11/2017   CO2 19 (L) 07/11/2017   TSH 13.110 (H) 07/10/2017   INR 1.11 07/11/2017   HGBA1C 8.8 (H) 05/09/2017    Len Childs, MD 07/12/2017 3:05 PM

## 2017-07-12 NOTE — Progress Notes (Signed)
Results for CLEOTA, PELLERITO (MRN [id number]) as of 07/12/2017 08:39  Ref. Range 07/11/2017 07:27 07/11/2017 11:22 07/11/2017 15:01 07/11/2017 21:07 07/12/2017 08:22  Glucose-Capillary Latest Ref Range: 65 - 99 mg/dL 132 (H) 255 (H) 213 (H) 109 (H) 55 (L)  Noted that blood sugar dropped to 55 mg/dl this am. Recommend decreasing Lantus to 5 units BID if blood sugars continue to be low. Patient takes Lantus 10 units daily at home. Continue Novolog SENSITIVE correction scale TID & HS. Will continue to monitor blood sugars while in the hospital.  Harvel Ricks RN BSN CDE Diabetes Coordinator Pager: [phone]  8am-5pm

## 2017-07-12 NOTE — Brief Op Note (Signed)
07/06/2017 - 07/12/2017  5:10 PM  PATIENT:  Jerolyn Center  67 y.o. female  PRE-OPERATIVE DIAGNOSIS:  pericardial effusion  POST-OPERATIVE DIAGNOSIS:  pericardial effusion  PROCEDURE:  Procedure(s): SUBXYPHOID PERICARDIAL WINDOW (N/A)  SURGEON:  Surgeon(s) and Role:    Ivin Poot, MD - Primary  PHYSICIAN ASSISTANT:  Nicholes Rough, PA-C   ANESTHESIA:   general  EBL: 10 ml  BLOOD ADMINISTERED:none  DRAINS: SINGLE BLAKE DRAIN   LOCAL MEDICATIONS USED:  NONE  SPECIMEN:  Source of Specimen:  PERICARDIAL FLUID AND PERICARDIUM  DISPOSITION OF SPECIMEN:  PATHOLOGY  COUNTS:  YES  TOURNIQUET:  * No tourniquets in log *  DICTATION: .Dragon Dictation  PLAN OF CARE: Admit to inpatient   PATIENT DISPOSITION:  ICU - intubated and hemodynamically stable.   Delay start of Pharmacological VTE agent (>24hrs) due to surgical blood loss or risk of bleeding: yes

## 2017-07-12 NOTE — Progress Notes (Addendum)
PT Cancellation Note  Patient Details Name: Brittany Mercado MRN: [id number] DOB: March 13, 1950   Cancelled Treatment:    Reason Eval/Treat Not Completed: Medical issues which prohibited therapy. Pt currently NPO with plan for OR today to undergo pericardial window. PT to hold treatment today.    Lorriane Shire 07/12/2017, 9:52 AM

## 2017-07-12 NOTE — Anesthesia Procedure Notes (Signed)
Central Venous Catheter Insertion Performed by: Effie Berkshire, MD, anesthesiologist Start/End5/17/2019 3:05 PM, 07/12/2017 3:15 PM Patient location: Pre-op. Preanesthetic checklist: patient identified, IV checked, site marked, risks and benefits discussed, surgical consent, monitors and equipment checked, pre-op evaluation, timeout performed and anesthesia consent Position: Trendelenburg Lidocaine 1% used for infiltration and patient sedated Hand hygiene performed , maximum sterile barriers used  and Seldinger technique used Catheter size: 8 Fr Total catheter length 16. Central line was placed.Double lumen Procedure performed using ultrasound guided technique. Ultrasound Notes:anatomy identified, needle tip was noted to be adjacent to the nerve/plexus identified, no ultrasound evidence of intravascular and/or intraneural injection and image(s) printed for medical record Attempts: 1 Following insertion, dressing applied, line sutured and Biopatch. Post procedure assessment: blood return through all ports  Patient tolerated the procedure well with no immediate complications.

## 2017-07-12 NOTE — Consult Note (Signed)
   [facility] CM Inpatient Consult   07/12/2017  Naama Sappington 09/29/1950 [id number]    [facility] Care Management follow up. Ms. Chrismer was transferred to [facility] from [place] on 07/11/17.  Chart reviewed. Noted Ms. Pajak is scheduled for a pericardial window.  Spoke with inpatient RNCM to make aware that [facility] is following for disposition plans.   Marthenia Rolling, MSN-Ed, RN,BSN [facility] Liaison [phone]

## 2017-07-13 ENCOUNTER — Inpatient Hospital Stay (HOSPITAL_COMMUNITY): Payer: Medicare Other

## 2017-07-13 DIAGNOSIS — I313 Pericardial effusion (noninflammatory): Secondary | ICD-10-CM

## 2017-07-13 DIAGNOSIS — E119 Type 2 diabetes mellitus without complications: Secondary | ICD-10-CM

## 2017-07-13 LAB — POCT I-STAT 3, ART BLOOD GAS (G3+)
BICARBONATE: 22.4 mmol/L (ref 20.0–28.0)
Bicarbonate: 23.4 mmol/L (ref 20.0–28.0)
O2 SAT: 98 %
O2 Saturation: 100 %
PH ART: 7.484 — AB (ref 7.350–7.450)
Patient temperature: 97.3
TCO2: 23 mmol/L (ref 22–32)
TCO2: 24 mmol/L (ref 22–32)
pCO2 arterial: 28.6 mmHg — ABNORMAL LOW (ref 32.0–48.0)
pCO2 arterial: 30.9 mmHg — ABNORMAL LOW (ref 32.0–48.0)
pH, Arterial: 7.5 — ABNORMAL HIGH (ref 7.350–7.450)
pO2, Arterial: 248 mmHg — ABNORMAL HIGH (ref 83.0–108.0)
pO2, Arterial: 97 mmHg (ref 83.0–108.0)

## 2017-07-13 LAB — GLUCOSE, CAPILLARY
GLUCOSE-CAPILLARY: 152 mg/dL — AB (ref 65–99)
GLUCOSE-CAPILLARY: 416 mg/dL — AB (ref 65–99)
Glucose-Capillary: 165 mg/dL — ABNORMAL HIGH (ref 65–99)
Glucose-Capillary: 390 mg/dL — ABNORMAL HIGH (ref 65–99)

## 2017-07-13 LAB — BASIC METABOLIC PANEL
Anion gap: 12 (ref 5–15)
BUN: 14 mg/dL (ref 6–20)
CHLORIDE: 110 mmol/L (ref 101–111)
CO2: 20 mmol/L — AB (ref 22–32)
Calcium: 7.7 mg/dL — ABNORMAL LOW (ref 8.9–10.3)
Creatinine, Ser: 0.96 mg/dL (ref 0.44–1.00)
GFR calc Af Amer: >60 mL/min (ref >60)
GFR calc non Af Amer: >60 mL/min (ref >60)
Glucose, Bld: 182 mg/dL — ABNORMAL HIGH (ref 65–99)
Potassium: 3.7 mmol/L (ref 3.5–5.1)
Sodium: 142 mmol/L (ref 135–145)

## 2017-07-13 LAB — CBC
HEMATOCRIT: 25.4 % — AB (ref 36.0–46.0)
HEMOGLOBIN: 9.1 g/dL — AB (ref 12.0–15.0)
MCH: 28.5 pg (ref 26.0–34.0)
MCHC: 35.8 g/dL (ref 30.0–36.0)
MCV: 79.6 fL (ref 78.0–100.0)
Platelets: 357 10*3/uL (ref 150–400)
RBC: 3.19 MIL/uL — ABNORMAL LOW (ref 3.87–5.11)
RDW: 16.3 % — ABNORMAL HIGH (ref 11.5–15.5)
WBC: 9.9 10*3/uL (ref 4.0–10.5)

## 2017-07-13 LAB — GLUCOSE, RANDOM: Glucose, Bld: 498 mg/dL — ABNORMAL HIGH (ref 65–99)

## 2017-07-13 MED ORDER — LIP MEDEX EX OINT
TOPICAL_OINTMENT | CUTANEOUS | Status: DC | PRN
  Filled 2017-07-13: qty 7

## 2017-07-13 MED ORDER — FUROSEMIDE 20 MG PO TABS
20.0000 mg | ORAL_TABLET | Freq: Once | ORAL | Status: AC
  Administered 2017-07-13: 20 mg via ORAL
  Filled 2017-07-13: qty 1

## 2017-07-13 MED ORDER — BLISTEX MEDICATED EX OINT
TOPICAL_OINTMENT | CUTANEOUS | Status: DC | PRN
  Filled 2017-07-13: qty 6.3

## 2017-07-13 MED ORDER — SODIUM CHLORIDE 0.9% FLUSH
10.0000 mL | Freq: Two times a day (BID) | INTRAVENOUS | Status: DC
  Administered 2017-07-13: 10 mL
  Administered 2017-07-14: 20 mL
  Administered 2017-07-14: 30 mL

## 2017-07-13 MED ORDER — GLUCERNA 1.2 CAL PO LIQD
237.0000 mL | Freq: Every day | ORAL | Status: DC
  Administered 2017-07-14 – 2017-07-15 (×3): 237 mL via ORAL
  Filled 2017-07-13 (×7): qty 237

## 2017-07-13 MED ORDER — CHLORHEXIDINE GLUCONATE CLOTH 2 % EX PADS
6.0000 | MEDICATED_PAD | Freq: Every day | CUTANEOUS | Status: DC
  Administered 2017-07-13 – 2017-07-14 (×2): 6 via TOPICAL

## 2017-07-13 MED ORDER — INSULIN ASPART 100 UNIT/ML ~~LOC~~ SOLN: 0.0000 [IU] | Freq: Every day | SUBCUTANEOUS | Status: DC

## 2017-07-13 MED ORDER — POTASSIUM CHLORIDE 10 MEQ/50ML IV SOLN
10.0000 meq | INTRAVENOUS | Status: AC
  Administered 2017-07-13 (×3): 10 meq via INTRAVENOUS

## 2017-07-13 MED ORDER — INSULIN ASPART 100 UNIT/ML ~~LOC~~ SOLN
0.0000 [IU] | Freq: Three times a day (TID) | SUBCUTANEOUS | Status: DC
  Administered 2017-07-13: 7 [IU] via SUBCUTANEOUS
  Administered 2017-07-14: 5 [IU] via SUBCUTANEOUS

## 2017-07-13 MED ORDER — INSULIN GLARGINE 100 UNIT/ML ~~LOC~~ SOLN
6.0000 [IU] | Freq: Every day | SUBCUTANEOUS | Status: DC
  Administered 2017-07-13: 6 [IU] via SUBCUTANEOUS
  Filled 2017-07-13: qty 0.06

## 2017-07-13 MED ORDER — INSULIN ASPART 100 UNIT/ML ~~LOC~~ SOLN
6.0000 [IU] | Freq: Once | SUBCUTANEOUS | Status: AC
  Administered 2017-07-13: 6 [IU] via SUBCUTANEOUS

## 2017-07-13 MED ORDER — SODIUM CHLORIDE 0.9% FLUSH: 10.0000 mL | INTRAVENOUS | Status: DC | PRN

## 2017-07-13 NOTE — Op Note (Signed)
NAMEGIRLIE, VELTRI MEDICAL RECORD EN:[id number] ACCOUNT [id number] DATE OF BIRTH:1950/09/25 FACILITY: [facility] LOCATION: [facility] PHYSICIAN:Willeen Novak VAN TRIGT III, MD  OPERATIVE REPORT  DATE OF PROCEDURE:  07/12/2017  PROCEDURE:  Subxiphoid pericardial window.    SURGEON:  Len Childs, MD  ASSISTANT:  Nicholes Rough, PA-C   ANESTHESIA:  General.  PREOPERATIVE DIAGNOSES:  Moderate pericardial effusion, symptomatic.  History of advanced stage lung cancer.  POSTOPERATIVE DIAGNOSES:  Moderate pericardial effusion, symptomatic.  History of advanced stage lung cancer.  DESCRIPTION OF PROCEDURE:  The patient was brought from preoperative holding where informed consent was documented and all questions regarding the procedure for the patient were addressed.  She was brought to the operating room and placed supine on the  operating table.  General anesthesia was induced.  She remained stable.  The chest was prepped and draped as a sterile field.  A proper time-out was performed.  A small incision was made centered on the xiphoid.  The xiphoid cartilage was excised.  The  sternal elevating retractor was placed.  The subcutaneous and connective tissue anterior to the pericardium was dissected.  The pericardium was identified.  A small incision was made with a 15 blade, and clear pericardial fluid under pressure immediately  drained.  This was sent for cytology as well as cultures.  The opening in the pericardium was extended, and a window was excised of pericardial tissue.  This was sent for histology.  The pericardial tissue was minimally thickened and inflamed.  There was no evidence of tumor nodularity.  Fluid of 450 mL was drained from the pericardial space.  The epicardium was examined.  There is no evidence of inflammation, fibrinous exudate, or tumor nodularity.  There was some mild edema of the epicardium.  A 28-French soft Bard catheter drain was placed dependently and pericardium brought  out through a separate incision.  The sternal elevating retractor was removed.  The fascia was closed with interrupted #1 Vicryl.  The subcutaneous and skin layers were closed with a running Vicryl, and sterile dressings were applied.  The chest tube was connected to a Pleur-Evac  underwater seal drainage system.  The patient was reversed from anesthesia, extubated and returned to the recovery room in stable condition.  LN/NUANCE  D:07/13/2017 T:07/13/2017 JOB:[id number]

## 2017-07-13 NOTE — Anesthesia Postprocedure Evaluation (Signed)
Anesthesia Post Note  Patient: Brittany Mercado  Procedure(s) Performed: SUBXYPHOID PERICARDIAL WINDOW (N/A )     Patient location during evaluation: Other Anesthesia Type: General Level of consciousness: awake and alert Pain management: pain level controlled Vital Signs Assessment: post-procedure vital signs reviewed and stable Respiratory status: spontaneous breathing, nonlabored ventilation, respiratory function stable and patient connected to nasal cannula oxygen Cardiovascular status: blood pressure returned to baseline and stable Postop Assessment: no apparent nausea or vomiting Anesthetic complications: no    Last Vitals:  Vitals:   07/13/17 0700 07/13/17 0803  BP:    Pulse:    Resp: (!) 22   Temp:  (!) 36.4 C  SpO2: 100%     Last Pain:  Vitals:   07/13/17 0803  TempSrc: Oral  PainSc:                  Effie Berkshire

## 2017-07-13 NOTE — Progress Notes (Addendum)
Patient ID: Brittany Mercado, female   DOB: [birth year], 67 y.o.   MRN: [id number]  TCTS DAILY ICU PROGRESS NOTE                   [address]            [place], [address]          [id number]   1 Day Post-Op Procedure(s) (LRB): SUBXYPHOID PERICARDIAL WINDOW (N/A)  Total Length of Stay:  LOS: 7 days   Subjective: Patient sitting in chair following pericardial window and drainage of pericardial effusion yesterday, awake and alert  Objective: Vital signs in last 24 hours: Temp:  [97.3 F (36.3 C)-98.6 F (37 C)] 97.3 F (36.3 C) (05/18 0400) Pulse Rate:  [92-105] 105 (05/17 1949) Cardiac Rhythm: Sinus tachycardia (05/18 0000) Resp:  [15-32] 22 (05/18 0700) BP: (78-126)/(40-71) 115/61 (05/17 2300) SpO2:  [94 %-100 %] 100 % (05/18 0700) Arterial Line BP: (119-173)/(40-51) 136/42 (05/18 0700) Weight:  [83 lb (37.6 kg)-93 lb 7.6 oz (42.4 kg)] 93 lb 7.6 oz (42.4 kg) (05/18 0600)  Filed Weights   07/11/17 1758 07/12/17 1404 07/13/17 0600  Weight: 83 lb 12.4 oz (38 kg) 83 lb (37.6 kg) 93 lb 7.6 oz (42.4 kg)    Weight change: -12.4 oz (-0.352 kg)   Hemodynamic parameters for last 24 hours:    Intake/Output from previous day: 05/17 0701 - 05/18 0700 In: 1705.6 [I.V.:1455.6; IV Piggyback:250] Out: 1130 [Urine:950; Chest Tube:180]  Intake/Output this shift: No intake/output data recorded.  Current Meds: Scheduled Meds: . acetaminophen  1,000 mg Oral Q6H   Or  . acetaminophen (TYLENOL) oral liquid 160 mg/5 mL  1,000 mg Oral Q6H  . aspirin EC  81 mg Oral Daily  . bisacodyl  10 mg Oral Daily  . feeding supplement (ENSURE ENLIVE)  237 mL Oral Q24H  . feeding supplement (PRO-STAT SUGAR FREE 64)  30 mL Oral BID  . ferrous sulfate  325 mg Oral Q1200  . guaiFENesin  1,200 mg Oral BID  . insulin aspart  0-24 Units Subcutaneous Q4H  . levofloxacin  750 mg Oral Q48H  . levothyroxine  100 mcg Oral QAC breakfast  . megestrol  800 mg Oral Daily  . multivitamin with  minerals  1 tablet Oral Q1200  . senna-docusate  1 tablet Oral QHS   Continuous Infusions: . sodium chloride 100 mL/hr at 07/12/17 2100  . phenylephrine (NEO-SYNEPHRINE) Adult infusion 25 mcg/min (07/13/17 0700)  . potassium chloride    . potassium chloride 10 mEq (07/13/17 0728)   PRN Meds:.morphine injection, ondansetron (ZOFRAN) IV, oxyCODONE, potassium chloride, traMADol  General appearance: alert, cooperative, appears older than stated age and no distress Neurologic: intact Heart: regular rate and rhythm, S1, S2 normal, no murmur, click, rub or gallop Lungs: diminished breath sounds bibasilar Abdomen: soft, non-tender; bowel sounds normal; no masses,  no organomegaly Extremities: extremities normal, atraumatic, no cyanosis or edema and Homans sign is negative, no sign of DVT Wound: Approximately 300 mL total drainage from the pericardial tube since yesterday  Lab Results: CBC: Recent Labs    07/11/17 1950 07/13/17 0505  WBC 7.0 9.9  HGB 9.0* 9.1*  HCT 26.6* 25.4*  PLT 260 357   BMET:  Recent Labs    07/11/17 1950 07/13/17 0505  NA 139 142  K 3.8 3.7  CL 111 110  CO2 19* 20*  GLUCOSE 100* 182*  BUN 8 14  CREATININE 0.90 0.96  CALCIUM 7.6* 7.7*  CMET: Lab Results  Component Value Date   WBC 9.9 07/13/2017   HGB 9.1 (L) 07/13/2017   HCT 25.4 (L) 07/13/2017   PLT 357 07/13/2017   GLUCOSE 182 (H) 07/13/2017   CHOL 145 04/11/2017   TRIG 98 04/11/2017   HDL 41 04/11/2017   LDLCALC 84 04/11/2017   ALT 255 (H) 07/11/2017   AST 631 (H) 07/11/2017   NA 142 07/13/2017   K 3.7 07/13/2017   CL 110 07/13/2017   CREATININE 0.96 07/13/2017   BUN 14 07/13/2017   CO2 20 (L) 07/13/2017   TSH 13.110 (H) 07/10/2017   INR 1.11 07/11/2017   HGBA1C 8.8 (H) 05/09/2017      PT/INR:  Recent Labs    07/11/17 1950  LABPROT 14.2  INR 1.11   Radiology: Dg Chest Port 1 View  Result Date: 07/12/2017 CLINICAL DATA:  Status post subxiphoid pericardial window. EXAM:  PORTABLE CHEST 1 VIEW COMPARISON:  07/08/2017 CT, 07/06/2017 chest radiograph and prior studies FINDINGS: No significant change in cardiomediastinal silhouette. A RIGHT IJ central venous catheter is noted with tip overlying the SUPERIOR cavoatrial junction. A pericardial drain is now noted. Mild bibasilar atelectasis noted with possible trace effusions. There is no evidence of pneumothorax. IMPRESSION: RIGHT IJ central venous catheter and pericardial drain. No pneumothorax. Mild bibasilar atelectasis with possible trace effusions. Electronically Signed   By: Margarette Canada M.D.   On: 07/12/2017 18:54     Assessment/Plan: S/P Procedure(s) (LRB): SUBXYPHOID PERICARDIAL WINDOW (N/A) Mobilize See progression orders Leave tube in another day Patient remains on neo-wean off as tolerated Renal function is stable Hemoglobin hematocrit shows preop anemia, stable postop LFTs markedly elevated PAS hose for DVT prophylaxis currently  Grace Isaac 07/13/2017 7:35 AM

## 2017-07-13 NOTE — Plan of Care (Signed)
  Problem: Clinical Measurements: Goal: Diagnostic test results will improve Outcome: Progressing Goal: Respiratory complications will improve Outcome: Progressing   

## 2017-07-13 NOTE — Progress Notes (Signed)
[place] TEAM 1 - Stepdown/ICU TEAM  Brittany Mercado  JQG:[id number] DOB: 03-Dec-1950 DOA: 07/06/2017 PCP: [name], DO    Brief Narrative:  67 year old female w/ a hx of stage IV non-small cell lung cancer/adenocarcinoma currently on IV Ketruda/immunotherapy every 3 weeks (Dr. [name]), poorly controlled DM2 with frequent episodes of DKA, HTN, and hypothyroidism who presented to the ED 07/06/2017 due to nausea and was found to be in DKA.  Her DKA initially resolved, but then she suffered recurrent DKA on 5/15.  She was also found to have a  postobstructive pneumonia, and a large pericardial effusion.  Subjective: Pt is now s/p subxiphoid pericardial window per Dr. Prescott Gum on 5/17.  She was placed in the ICU postop to facilitate pressor use due to persistent hypotension postop.  She is sitting up in bed eating a popsicle at the time of myv visit.  She is in good spirits and states that she feels much better.  She denies chest pain shortness of breath nausea or vomiting.  Assessment & Plan:  Moderate pericardial effusion - s/p pericardial window 5/17 Noted on CTA chest - compared to TTE 5/12 repeat TTE 5/15 suggested moderate to large pericardial effusion - s/p pericardial window  Acute respiratory failure with hypoxia Secondary to lung cancer and postobstructive pneumonia - sats stable today   DKA in DM2 A1c 3/14 was 8.8 - holding metformin and Amaryl - on 5/15 developed recurrent DKA - CBG well controlled at present    Acute kidney injury secondary to dehydration - resolved w/ volume expansion   Lobar postobstructive pneumonia CTA chest 5/13 noted right lower lobe postobstructive pneumonia/pneumonitis - cont abx coverage   Severe protein calorie malnutrition resumed home Megace  Normocytic anemia/anemia of chronic disease  Hgb stable  Elevated troponins Flat trend - no ischemic type chest pain - likely demand ischemia from acute illness  Hypothyroidism TSH  improved from 21.5 on 4/22 to 13.1 on 5/15 - follow w/o change for now   Abnormal LFTs No liver lesions noted on CT abdom March 2019 - follow LFTs   Stage IV non-small cell lung cancer/adenocarcinoma Care per Dr. Inda Merlin   HTN Currently on neo  Adult failure to thrive - Severe malnutrition in the context of chronic illness stage IV lung cancer, postobstructive pneumonia, possible malignant pericardial effusion, severe malnutrition  Severe PAD Aspirin continued - Plavix held by TCTS for pericardial window 5/17  DVT prophylaxis: SQ heparin  Code Status: FULL CODE Family Communication: No family present at time of exam  Disposition Plan: ICU on neo   Consultants:  TCTS  Antimicrobials:  levaquin 5/14 >  Objective: Blood pressure 115/61, pulse (!) 105, temperature 97.9 F (36.6 C), temperature source Axillary, resp. rate 18, height 5\' 3"  (1.6 m), weight 42.4 kg (93 lb 7.6 oz), SpO2 99 %.  Intake/Output Summary (Last 24 hours) at 07/13/2017 1215 Last data filed at 07/13/2017 1200 Gross per 24 hour  Intake 2318.13 ml  Output 980 ml  Net 1338.13 ml   Filed Weights   07/11/17 1758 07/12/17 1404 07/13/17 0600  Weight: 38 kg (83 lb 12.4 oz) 37.6 kg (83 lb) 42.4 kg (93 lb 7.6 oz)    Examination: General: No acute respiratory distress - alert  Lungs: Clear to auscultation bilaterally  Cardiovascular: Regular rate and rhythm without murmur Abdomen: Nontender, nondistended, soft, bowel sounds positive Extremities: No significant edema B LE   CBC: Recent Labs  Lab 07/06/17 1312  07/09/17 0334 07/11/17 1950 07/13/17 0505  WBC 13.2*  --  8.0 7.0 9.9  NEUTROABS 9.9*  --   --   --   --   HGB 9.5*  --  10.8* 9.0* 9.1*  HCT 26.0*   < > 31.6* 26.6* 25.4*  MCV 80.0  --  80.8 82.1 79.6  PLT 319  --  275 260 357   < > = values in this interval not displayed.   Basic Metabolic Panel: Recent Labs  Lab 07/07/17 2129  07/10/17 1223  07/11/17 0506 07/11/17 1950  07/13/17 0505  NA 144   < > 137   < > 140 139 142  K 2.7*   < > 3.6   < > 3.6 3.8 3.7  CL 111   < > 106   < > 112* 111 110  CO2 21*   < > 14*   < > 21* 19* 20*  GLUCOSE 52*   < > 336*   < > 54* 100* 182*  BUN 11   < > 25*   < > 19 8 14   CREATININE 0.81   < > 1.28*   < > 0.85 0.90 0.96  CALCIUM 8.1*   < > 8.4*   < > 7.8* 7.6* 7.7*  MG 1.1*  --  1.3*  --  2.1  --   --    < > = values in this interval not displayed.   GFR: Estimated Creatinine Clearance: 38.6 mL/min (by C-G formula based on SCr of 0.96 mg/dL).  Liver Function Tests: Recent Labs  Lab 07/06/17 1312 07/07/17 0618 07/10/17 1223 07/11/17 1950  AST 387* 234* 517* 631*  ALT 283* 213* 267* 255*  ALKPHOS 529* 423* 594* 752*  BILITOT 1.8* 0.7 1.9* 0.8  PROT 7.7 6.1* 6.5 5.9*  ALBUMIN 3.1* 2.5* 2.4* 2.2*    Coagulation Profile: Recent Labs  Lab 07/07/17 0709 07/11/17 1950  INR 1.08 1.11    Cardiac Enzymes: Recent Labs  Lab 07/06/17 1312 07/07/17 0618 07/07/17 1109 07/07/17 1758  TROPONINI <0.03 0.11* 0.09* 0.07*    HbA1C: Hgb A1c MFr Bld  Date/Time Value Ref Range Status  05/09/2017 08:06 AM 8.8 (H) 4.8 - 5.6 % Final    Comment:    (NOTE) Pre diabetes:          5.7%-6.4% Diabetes:              >6.4% Glycemic control for   <7.0% adults with diabetes   05/08/2017 11:08 AM 8.8 (H) 4.8 - 5.6 % Final    Comment:    (NOTE) Pre diabetes:          5.7%-6.4% Diabetes:              >6.4% Glycemic control for   <7.0% adults with diabetes     CBG: Recent Labs  Lab 07/12/17 1727 07/12/17 2037 07/12/17 2318 07/13/17 0324 07/13/17 0806  GLUCAP 207* 304* 242* 165* 152*    Recent Results (from the past 240 hour(s))  Culture, blood (routine x 2)     Status: None   Collection Time: 07/06/17  6:40 PM  Result Value Ref Range Status   Specimen Description   Final    BLOOD RIGHT ANTECUBITAL Performed at [facility], [address] [place], [place] [address]    Special Requests   Final     BOTTLES DRAWN AEROBIC AND ANAEROBIC Blood Culture adequate volume Performed at [facility], [address] [place], [place] [address]  Culture   Final    NO GROWTH 5 DAYS Performed at [facility], [place] [address] [place], [place] [address]    Report Status 07/11/2017 FINAL  Final  MRSA PCR Screening     Status: None   Collection Time: 07/06/17  8:48 PM  Result Value Ref Range Status   MRSA by PCR NEGATIVE NEGATIVE Final    Comment:        The GeneXpert MRSA Assay (FDA approved for NASAL specimens only), is one component of a comprehensive MRSA colonization surveillance program. It is not intended to diagnose MRSA infection nor to guide or monitor treatment for MRSA infections. Performed at [facility], [place] [address] [place], [place] [address]   Aerobic/Anaerobic Culture (surgical/deep wound)     Status: None (Preliminary result)   Collection Time: 07/12/17  4:35 PM  Result Value Ref Range Status   Specimen Description PERICARDIAL  Final   Special Requests NONE  Final   Gram Stain   Final    RARE WBC PRESENT, PREDOMINANTLY MONONUCLEAR NO ORGANISMS SEEN    Culture   Final    NO GROWTH < 24 HOURS Performed at [facility], [place] [address] [place], [place] [address]    Report Status PENDING  Incomplete  MRSA PCR Screening     Status: None   Collection Time: 07/12/17  8:49 PM  Result Value Ref Range Status   MRSA by PCR NEGATIVE NEGATIVE Final    Comment:        The GeneXpert MRSA Assay (FDA approved for NASAL specimens only), is one component of a comprehensive MRSA colonization surveillance program. It is not intended to diagnose MRSA infection nor to guide or monitor treatment for MRSA infections. Performed at [facility], [place] [address], [place], [place] [address]      Scheduled Meds: . acetaminophen  1,000 mg Oral Q6H   Or  . acetaminophen (TYLENOL) oral liquid 160 mg/5 mL  1,000 mg Oral Q6H  .  aspirin EC  81 mg Oral Daily  . bisacodyl  10 mg Oral Daily  . feeding supplement (ENSURE ENLIVE)  237 mL Oral Q24H  . feeding supplement (PRO-STAT SUGAR FREE 64)  30 mL Oral BID  . ferrous sulfate  325 mg Oral Q1200  . guaiFENesin  1,200 mg Oral BID  . insulin aspart  0-24 Units Subcutaneous Q4H  . levofloxacin  750 mg Oral Q48H  . levothyroxine  100 mcg Oral QAC breakfast  . megestrol  800 mg Oral Daily  . multivitamin with minerals  1 tablet Oral Q1200  . senna-docusate  1 tablet Oral QHS     LOS: 7 days   Cherene Altes, MD [facility] Office  [phone] Pager - Text Page per Amion as per below:  On-Call/Text Page:      [email or web address]      password [email or web address]  If 7PM-7AM, please contact night-coverage [email or web address] Password [facility] 07/13/2017, 12:15 PM

## 2017-07-13 NOTE — Progress Notes (Signed)
On call practitioner notified regarding patients FSBS 416. Order received for one time dose of SQ Novolog 6 - units. On call practitioner notified again regarding scheduled nutritional supplement and its total carbs seeking clarification on how to proceed due to patients current elevated glucose. Glucose confirmed with serum lab and noted as 498. Practitioner made aware of serum glucose. Practitioner to review patients chart. Awaiting further orders at this time.

## 2017-07-13 NOTE — Plan of Care (Signed)
Patient meeting expectations. Will continue to monitor

## 2017-07-13 NOTE — Progress Notes (Signed)
Patient ID: Brittany Mercado, female   DOB: 1950/07/12, 67 y.o.   MRN: [id number] EVENING ROUNDS NOTE :     [place]       [place] [address]             [phone]                 1 Day Post-Op Procedure(s) (LRB): SUBXYPHOID PERICARDIAL WINDOW (N/A)  Total Length of Stay:  LOS: 7 days  BP (!) 103/54   Pulse (!) 105   Temp 98 F (36.7 C) (Oral)   Resp (!) 43   Ht 5\' 3"  (1.6 m)   Wt 93 lb 7.6 oz (42.4 kg)   SpO2 96%   BMI 16.56 kg/m   .Intake/Output      05/18 0701 - 05/19 0700   P.O.    I.V. (mL/kg) 1212.5 (28.6)   IV Piggyback    Total Intake(mL/kg) 1212.5 (28.6)   Urine (mL/kg/hr) 450 (0.9)   Stool    Chest Tube 60   Total Output 510   Net +702.5         . sodium chloride 100 mL/hr at 07/13/17 1800  . phenylephrine (NEO-SYNEPHRINE) Adult infusion Stopped (07/13/17 1006)  . potassium chloride       Lab Results  Component Value Date   WBC 9.9 07/13/2017   HGB 9.1 (L) 07/13/2017   HCT 25.4 (L) 07/13/2017   PLT 357 07/13/2017   GLUCOSE 182 (H) 07/13/2017   CHOL 145 04/11/2017   TRIG 98 04/11/2017   HDL 41 04/11/2017   LDLCALC 84 04/11/2017   ALT 255 (H) 07/11/2017   AST 631 (H) 07/11/2017   NA 142 07/13/2017   K 3.7 07/13/2017   CL 110 07/13/2017   CREATININE 0.96 07/13/2017   BUN 14 07/13/2017   CO2 20 (L) 07/13/2017   TSH 13.110 (H) 07/10/2017   INR 1.11 07/11/2017   HGBA1C 8.8 (H) 05/09/2017   Stable day Off drips    Grace Isaac MD  Beeper [phone] Office [phone] 07/13/2017 7:05 PM

## 2017-07-14 ENCOUNTER — Inpatient Hospital Stay (HOSPITAL_COMMUNITY): Payer: Medicare Other

## 2017-07-14 ENCOUNTER — Encounter (HOSPITAL_COMMUNITY): Payer: Self-pay | Admitting: Cardiothoracic Surgery

## 2017-07-14 DIAGNOSIS — E1165 Type 2 diabetes mellitus with hyperglycemia: Secondary | ICD-10-CM

## 2017-07-14 LAB — GLUCOSE, CAPILLARY
GLUCOSE-CAPILLARY: 301 mg/dL — AB (ref 65–99)
GLUCOSE-CAPILLARY: 209 mg/dL — AB (ref 65–99)
GLUCOSE-CAPILLARY: 99 mg/dL (ref 65–99)
Glucose-Capillary: 282 mg/dL — ABNORMAL HIGH (ref 65–99)
Glucose-Capillary: 140 mg/dL — ABNORMAL HIGH (ref 65–99)

## 2017-07-14 LAB — COMPREHENSIVE METABOLIC PANEL
ALT: 145 U/L — ABNORMAL HIGH (ref 14–54)
AST: 235 U/L — ABNORMAL HIGH (ref 15–41)
Albumin: 2 g/dL — ABNORMAL LOW (ref 3.5–5.0)
Alkaline Phosphatase: 512 U/L — ABNORMAL HIGH (ref 38–126)
Anion gap: 7 (ref 5–15)
BUN: 17 mg/dL (ref 6–20)
CO2: 24 mmol/L (ref 22–32)
Calcium: 7.8 mg/dL — ABNORMAL LOW (ref 8.9–10.3)
Chloride: 108 mmol/L (ref 101–111)
Creatinine, Ser: 1.03 mg/dL — ABNORMAL HIGH (ref 0.44–1.00)
GFR calc Af Amer: >60 mL/min (ref >60)
GFR calc non Af Amer: 55 mL/min — ABNORMAL LOW (ref >60)
Glucose, Bld: 314 mg/dL — ABNORMAL HIGH (ref 65–99)
Potassium: 3.6 mmol/L (ref 3.5–5.1)
Sodium: 139 mmol/L (ref 135–145)
Total Bilirubin: 1.1 mg/dL (ref 0.3–1.2)
Total Protein: 4.7 g/dL — ABNORMAL LOW (ref 6.5–8.1)

## 2017-07-14 LAB — CBC
HCT: 23.8 % — ABNORMAL LOW (ref 36.0–46.0)
Hemoglobin: 8.4 g/dL — ABNORMAL LOW (ref 12.0–15.0)
MCH: 27.9 pg (ref 26.0–34.0)
MCHC: 35.3 g/dL (ref 30.0–36.0)
MCV: 79.1 fL (ref 78.0–100.0)
Platelets: 341 10*3/uL (ref 150–400)
RBC: 3.01 MIL/uL — ABNORMAL LOW (ref 3.87–5.11)
RDW: 16.4 % — ABNORMAL HIGH (ref 11.5–15.5)
WBC: 10.7 10*3/uL — ABNORMAL HIGH (ref 4.0–10.5)

## 2017-07-14 MED ORDER — INSULIN ASPART 100 UNIT/ML ~~LOC~~ SOLN
0.0000 [IU] | Freq: Three times a day (TID) | SUBCUTANEOUS | Status: DC
  Administered 2017-07-14: 2 [IU] via SUBCUTANEOUS
  Administered 2017-07-14: 5 [IU] via SUBCUTANEOUS
  Administered 2017-07-15: 3 [IU] via SUBCUTANEOUS
  Administered 2017-07-15: 2 [IU] via SUBCUTANEOUS
  Administered 2017-07-16 – 2017-07-17 (×3): 3 [IU] via SUBCUTANEOUS
  Administered 2017-07-17: 5 [IU] via SUBCUTANEOUS
  Administered 2017-07-17: 2 [IU] via SUBCUTANEOUS
  Administered 2017-07-18: 3 [IU] via SUBCUTANEOUS

## 2017-07-14 MED ORDER — LACTULOSE 10 GM/15ML PO SOLN
10.0000 g | Freq: Every day | ORAL | Status: DC | PRN
  Administered 2017-07-14: 10 g via ORAL
  Filled 2017-07-14: qty 15

## 2017-07-14 MED ORDER — INSULIN GLARGINE 100 UNIT/ML ~~LOC~~ SOLN
10.0000 [IU] | Freq: Every day | SUBCUTANEOUS | Status: DC
  Administered 2017-07-14 – 2017-07-17 (×4): 10 [IU] via SUBCUTANEOUS
  Filled 2017-07-14 (×7): qty 0.1

## 2017-07-14 MED ORDER — ORAL CARE MOUTH RINSE
15.0000 mL | Freq: Two times a day (BID) | OROMUCOSAL | Status: DC
  Administered 2017-07-14 – 2017-07-18 (×4): 15 mL via OROMUCOSAL

## 2017-07-14 MED ORDER — INSULIN ASPART 100 UNIT/ML ~~LOC~~ SOLN
0.0000 [IU] | Freq: Every day | SUBCUTANEOUS | Status: DC
  Administered 2017-07-16 – 2017-07-17 (×2): 3 [IU] via SUBCUTANEOUS

## 2017-07-14 NOTE — Progress Notes (Signed)
[place] TEAM 1 - Stepdown/ICU TEAM  Brittany Mercado  RKY:[id number] DOB: [birth year] DOA: 07/06/2017 PCP: [name], DO    Brief Narrative:  67 year old female w/ a hx of stage IV non-small cell lung cancer/adenocarcinoma currently on IV Ketruda/immunotherapy every 3 weeks (Dr. [name]), poorly controlled DM2 with frequent episodes of DKA, HTN, and hypothyroidism who presented to the ED 07/06/2017 due to nausea and was found to be in DKA.  Her DKA initially resolved, but then she suffered recurrent DKA on 5/15.  She was also found to have a  postobstructive pneumonia, and a large pericardial effusion.  Subjective: Resting comfortably in bed.  No acute issues this morning from a medical standpoint.    Assessment & Plan:  Moderate pericardial effusion - s/p pericardial window 5/17 Noted on CTA chest - compared to TTE 5/12 repeat TTE 5/15 suggested moderate to large pericardial effusion - s/p pericardial window - care per TCTS  Acute respiratory failure with hypoxia Secondary to lung cancer and postobstructive pneumonia - sats stable  DKA / Uncontrolled DM2 A1c 3/14 was 8.8 - holding metformin and Amaryl - on 5/15 developed recurrent DKA - CBG climbing again w/ resumption of diet - adjust tx again and follow    Acute kidney injury secondary to dehydration - resolved w/ volume expansion   Lobar postobstructive pneumonia CTA chest 5/13 noted right lower lobe postobstructive pneumonia/pneumonitis - cont abx coverage   Severe protein calorie malnutrition resumed home Megace  Normocytic anemia/anemia of chronic disease  Hgb stable  Elevated troponins Flat trend - no ischemic type chest pain - likely demand ischemia from acute illness - no further eval planned  Hypothyroidism TSH improved from 21.5 on 4/22 to 13.1 on 5/15 - follow w/o change for now - will need outpt f/u   Abnormal LFTs No liver lesions noted on CT abdom March 2019 - LFTs trending down at present -  likely low grade shock liver   Stage IV non-small cell lung cancer/adenocarcinoma Care per Dr. Inda Merlin   HTN Not an active issue presently   Adult failure to thrive - Severe malnutrition in the context of chronic illness stage IV lung cancer, postobstructive pneumonia, possible malignant pericardial effusion, severe malnutrition  Severe PAD ASA continues - Plavix on hold until pericardial tube removed   DVT prophylaxis: SCDs Code Status: FULL CODE Family Communication: No family present at time of exam  Disposition Plan: ICU   Consultants:  TCTS  Antimicrobials:  levaquin 5/14 >  Objective: Blood pressure (!) 125/51, pulse 94, temperature (!) 97.4 F (36.3 C), temperature source Oral, resp. rate 18, height 5\' 3"  (1.6 m), weight 42.4 kg (93 lb 7.6 oz), SpO2 97 %.  Intake/Output Summary (Last 24 hours) at 07/14/2017 1024 Last data filed at 07/14/2017 0600 Gross per 24 hour  Intake 1658.75 ml  Output 980 ml  Net 678.75 ml   Filed Weights   07/11/17 1758 07/12/17 1404 07/13/17 0600  Weight: 38 kg (83 lb 12.4 oz) 37.6 kg (83 lb) 42.4 kg (93 lb 7.6 oz)    Examination: General: no acute respiratory distress  Lungs: no appreciable wheezing  Extremities: no significant edema B LE   CBC: Recent Labs  Lab 07/11/17 1950 07/13/17 0505 07/14/17 0619  WBC 7.0 9.9 10.7*  HGB 9.0* 9.1* 8.4*  HCT 26.6* 25.4* 23.8*  MCV 82.1 79.6 79.1  PLT 260 357 315   Basic Metabolic Panel: Recent Labs  Lab 07/07/17 2129  07/10/17 1223  07/11/17 0506 07/11/17 1950  07/13/17 0505 07/13/17 2152 07/14/17 0619  NA 144   < > 137   < > 140 139 142  --  139  K 2.7*   < > 3.6   < > 3.6 3.8 3.7  --  3.6  CL 111   < > 106   < > 112* 111 110  --  108  CO2 21*   < > 14*   < > 21* 19* 20*  --  24  GLUCOSE 52*   < > 336*   < > 54* 100* 182* 498* 314*  BUN 11   < > 25*   < > 19 8 14   --  17  CREATININE 0.81   < > 1.28*   < > 0.85 0.90 0.96  --  1.03*  CALCIUM 8.1*   < > 8.4*   < > 7.8*  7.6* 7.7*  --  7.8*  MG 1.1*  --  1.3*  --  2.1  --   --   --   --    < > = values in this interval not displayed.   GFR: Estimated Creatinine Clearance: 36 mL/min (A) (by C-G formula based on SCr of 1.03 mg/dL (H)).  Liver Function Tests: Recent Labs  Lab 07/10/17 1223 07/11/17 1950 07/14/17 0619  AST 517* 631* 235*  ALT 267* 255* 145*  ALKPHOS 594* 752* 512*  BILITOT 1.9* 0.8 1.1  PROT 6.5 5.9* 4.7*  ALBUMIN 2.4* 2.2* 2.0*    Coagulation Profile: Recent Labs  Lab 07/11/17 1950  INR 1.11    Cardiac Enzymes: Recent Labs  Lab 07/07/17 1109 07/07/17 1758  TROPONINI 0.09* 0.07*    HbA1C: Hgb A1c MFr Bld  Date/Time Value Ref Range Status  05/09/2017 08:06 AM 8.8 (H) 4.8 - 5.6 % Final    Comment:    (NOTE) Pre diabetes:          5.7%-6.4% Diabetes:              >6.4% Glycemic control for   <7.0% adults with diabetes   05/08/2017 11:08 AM 8.8 (H) 4.8 - 5.6 % Final    Comment:    (NOTE) Pre diabetes:          5.7%-6.4% Diabetes:              >6.4% Glycemic control for   <7.0% adults with diabetes     CBG: Recent Labs  Lab 07/13/17 0806 07/13/17 1606 07/13/17 2115 07/14/17 0354 07/14/17 0629  GLUCAP 152* 390* 416* 301* 282*    Recent Results (from the past 240 hour(s))  Culture, blood (routine x 2)     Status: None   Collection Time: 07/06/17  6:40 PM  Result Value Ref Range Status   Specimen Description   Final    BLOOD RIGHT ANTECUBITAL Performed at [facility], [address] [place], [place] [address]    Special Requests   Final    BOTTLES DRAWN AEROBIC AND ANAEROBIC Blood Culture adequate volume Performed at [facility], [address] [place], [place] [address]    Culture   Final    NO GROWTH 5 DAYS Performed at [facility], [place] [address], [place], [place] [address]    Report Status 07/11/2017 FINAL  Final  MRSA PCR Screening     Status: None   Collection Time: 07/06/17  8:48 PM  Result Value Ref  Range Status   MRSA by PCR NEGATIVE  NEGATIVE Final    Comment:        The GeneXpert MRSA Assay (FDA approved for NASAL specimens only), is one component of a comprehensive MRSA colonization surveillance program. It is not intended to diagnose MRSA infection nor to guide or monitor treatment for MRSA infections. Performed at [facility], [place] [address], [place], [place] [address]   Aerobic/Anaerobic Culture (surgical/deep wound)     Status: None (Preliminary result)   Collection Time: 07/12/17  4:35 PM  Result Value Ref Range Status   Specimen Description PERICARDIAL  Final   Special Requests NONE  Final   Gram Stain   Final    RARE WBC PRESENT, PREDOMINANTLY MONONUCLEAR NO ORGANISMS SEEN    Culture   Final    NO GROWTH < 24 HOURS Performed at [facility], [place] [address], [place], [place] [address]    Report Status PENDING  Incomplete  MRSA PCR Screening     Status: None   Collection Time: 07/12/17  8:49 PM  Result Value Ref Range Status   MRSA by PCR NEGATIVE NEGATIVE Final    Comment:        The GeneXpert MRSA Assay (FDA approved for NASAL specimens only), is one component of a comprehensive MRSA colonization surveillance program. It is not intended to diagnose MRSA infection nor to guide or monitor treatment for MRSA infections. Performed at [facility], [place] [address] [place], [place] [address]      Scheduled Meds: . acetaminophen  1,000 mg Oral Q6H   Or  . acetaminophen (TYLENOL) oral liquid 160 mg/5 mL  1,000 mg Oral Q6H  . aspirin EC  81 mg Oral Daily  . bisacodyl  10 mg Oral Daily  . Chlorhexidine Gluconate Cloth  6 each Topical Daily  . feeding supplement (GLUCERNA 1.2 CAL)  237 mL Oral Q2000  . feeding supplement (PRO-STAT SUGAR FREE 64)  30 mL Oral BID  . ferrous sulfate  325 mg Oral Q1200  . guaiFENesin  1,200 mg Oral BID  . insulin aspart  0-5 Units Subcutaneous QHS  . insulin aspart  0-9 Units Subcutaneous TID  WC  . insulin glargine  6 Units Subcutaneous QHS  . levofloxacin  750 mg Oral Q48H  . levothyroxine  100 mcg Oral QAC breakfast  . mouth rinse  15 mL Mouth Rinse BID  . megestrol  800 mg Oral Daily  . multivitamin with minerals  1 tablet Oral Q1200  . senna-docusate  1 tablet Oral QHS  . sodium chloride flush  10-40 mL Intracatheter Q12H     LOS: 8 days   Cherene Altes, MD [facility] Office  [phone] Pager - Text Page per Amion as per below:  On-Call/Text Page:      [email or web address]      password [email or web address]  If 7PM-7AM, please contact night-coverage [email or web address] Password [facility] 07/14/2017, 10:24 AM

## 2017-07-14 NOTE — Progress Notes (Signed)
Patient ID: Brittany Mercado, female   DOB: Nov 27, 1950, 67 y.o.   MRN: [id number] TCTS DAILY ICU PROGRESS NOTE                   [place]            [place] [address]          [phone]   2 Days Post-Op Procedure(s) (LRB): SUBXYPHOID PERICARDIAL WINDOW (N/A)  Total Length of Stay:  LOS: 8 days   Subjective: Alert awake, neuro intact  Objective: Vital signs in last 24 hours: Temp:  [97.4 F (36.3 C)-98.3 F (36.8 C)] 97.4 F (36.3 C) (05/19 0801) Pulse Rate:  [94] 94 (05/19 0800) Cardiac Rhythm: Sinus tachycardia (05/19 0800) Resp:  [16-43] 18 (05/19 0800) BP: (93-137)/(42-57) 125/51 (05/19 0801) SpO2:  [92 %-100 %] 97 % (05/19 0800) Arterial Line BP: (69-135)/(42-73) 97/61 (05/18 1700)  Filed Weights   07/11/17 1758 07/12/17 1404 07/13/17 0600  Weight: 83 lb 12.4 oz (38 kg) 83 lb (37.6 kg) 93 lb 7.6 oz (42.4 kg)    Weight change:    Hemodynamic parameters for last 24 hours:    Intake/Output from previous day: 05/18 0701 - 05/19 0700 In: 2071.3 [P.O.:360; I.V.:1591.3; NG/GT:120] Out: 1080 [Urine:900; Chest Tube:180]  Intake/Output this shift: No intake/output data recorded.  Current Meds: Scheduled Meds: . acetaminophen  1,000 mg Oral Q6H   Or  . acetaminophen (TYLENOL) oral liquid 160 mg/5 mL  1,000 mg Oral Q6H  . aspirin EC  81 mg Oral Daily  . bisacodyl  10 mg Oral Daily  . Chlorhexidine Gluconate Cloth  6 each Topical Daily  . feeding supplement (GLUCERNA 1.2 CAL)  237 mL Oral Q2000  . feeding supplement (PRO-STAT SUGAR FREE 64)  30 mL Oral BID  . ferrous sulfate  325 mg Oral Q1200  . guaiFENesin  1,200 mg Oral BID  . insulin aspart  0-5 Units Subcutaneous QHS  . insulin aspart  0-9 Units Subcutaneous TID WC  . insulin glargine  6 Units Subcutaneous QHS  . levofloxacin  750 mg Oral Q48H  . levothyroxine  100 mcg Oral QAC breakfast  . mouth rinse  15 mL Mouth Rinse BID  . megestrol  800 mg Oral Daily  . multivitamin with minerals   1 tablet Oral Q1200  . senna-docusate  1 tablet Oral QHS  . sodium chloride flush  10-40 mL Intracatheter Q12H   Continuous Infusions: . sodium chloride 50 mL/hr at 07/14/17 0000  . phenylephrine (NEO-SYNEPHRINE) Adult infusion Stopped (07/13/17 1006)  . potassium chloride     PRN Meds:.lip balm, morphine injection, ondansetron (ZOFRAN) IV, oxyCODONE, potassium chloride, sodium chloride flush, traMADol  General appearance: alert, cooperative and no distress Neurologic: intact Heart: regular rate and rhythm, S1, S2 normal, no murmur, click, rub or gallop Lungs: diminished breath sounds bibasilar Abdomen: soft, non-tender; bowel sounds normal; no masses,  no organomegaly Extremities: extremities normal, atraumatic, no cyanosis or edema and Homans sign is negative, no sign of DVT Wound: Dressing intact drained 100 mL over the past 12 hours  Lab Results: CBC: Recent Labs    07/13/17 0505 07/14/17 0619  WBC 9.9 10.7*  HGB 9.1* 8.4*  HCT 25.4* 23.8*  PLT 357 341   BMET:  Recent Labs    07/13/17 0505 07/13/17 2152 07/14/17 0619  NA 142  --  139  K 3.7  --  3.6  CL 110  --  108  CO2 20*  --  24  GLUCOSE 182* 498* 314*  BUN 14  --  17  CREATININE 0.96  --  1.03*  CALCIUM 7.7*  --  7.8*    CMET: Lab Results  Component Value Date   WBC 10.7 (H) 07/14/2017   HGB 8.4 (L) 07/14/2017   HCT 23.8 (L) 07/14/2017   PLT 341 07/14/2017   GLUCOSE 314 (H) 07/14/2017   CHOL 145 04/11/2017   TRIG 98 04/11/2017   HDL 41 04/11/2017   LDLCALC 84 04/11/2017   ALT 145 (H) 07/14/2017   AST 235 (H) 07/14/2017   NA 139 07/14/2017   K 3.6 07/14/2017   CL 108 07/14/2017   CREATININE 1.03 (H) 07/14/2017   BUN 17 07/14/2017   CO2 24 07/14/2017   TSH 13.110 (H) 07/10/2017   INR 1.11 07/11/2017   HGBA1C 8.8 (H) 05/09/2017      PT/INR:  Recent Labs    07/11/17 1950  LABPROT 14.2  INR 1.11   Radiology: No results found.   Assessment/Plan: S/P Procedure(s) (LRB): SUBXYPHOID  PERICARDIAL WINDOW (N/A) Mobilize Diuresis Continue diabetes management-Levemir and sliding scale Elevated LFTs Creatinine stable Leave pericardial tube in another day, with 100 mL out in 12 hours  Grace Isaac 07/14/2017 8:19 AM

## 2017-07-14 NOTE — Consult Note (Signed)
[place] Nurse wound consult note Reason for Consult: wound care to right fifth toe Wound type: dry, stable, black eschar POA: Yes Measurement: entire right 5th toe tip Wound bed: stable, dry, black eschar Drainage (amount, consistency, odor) no drainage, no odor Periwound: intact Dressing procedure/placement/frequency: betadine to area, allow to air dry each shift.  NO foam dressings to area. Monitor the wound area(s) for worsening of condition such as: Signs/symptoms of infection,  Increase in size,  Development of or worsening of odor, Development of pain, or increased pain at the affected locations.  Notify the medical team if any of these develop.  Thank you for the consult.  Discussed plan of care with the patient and bedside nurse.  [place] nurse will not follow at this time.  Please re-consult the [place] team if needed.  Val Riles, RN, MSN, CWOCN, CNS-BC, pager [phone]

## 2017-07-14 NOTE — Progress Notes (Signed)
Patient noted with wound on right posterior pinky toe. Unable to determine type of wound. Wound noted as thick black eschar. Consult to wound/ostomy nurse placed. Patient and family at bedside report area present prior to admission.

## 2017-07-14 NOTE — Progress Notes (Signed)
EVENING ROUNDS NOTE :     [address]       [place] [address]             [phone]                 2 Days Post-Op Procedure(s) (LRB): SUBXYPHOID PERICARDIAL WINDOW (N/A)  Total Length of Stay:  LOS: 8 days  BP (!) 107/50   Pulse 94   Temp (!) 97.4 F (36.3 C) (Oral)   Resp 13   Ht 5\' 3"  (1.6 m)   Wt 93 lb 7.6 oz (42.4 kg)   SpO2 95%   BMI 16.56 kg/m   .Intake/Output      05/19 0701 - 05/20 0700   P.O.    I.V. (mL/kg) 850 (20)   NG/GT    Total Intake(mL/kg) 850 (20)   Urine (mL/kg/hr) 200 (0.4)   Chest Tube 80   Total Output 280   Net +570         . sodium chloride 50 mL/hr at 07/14/17 1700  . phenylephrine (NEO-SYNEPHRINE) Adult infusion Stopped (07/13/17 1006)  . potassium chloride       Lab Results  Component Value Date   WBC 10.7 (H) 07/14/2017   HGB 8.4 (L) 07/14/2017   HCT 23.8 (L) 07/14/2017   PLT 341 07/14/2017   GLUCOSE 314 (H) 07/14/2017   CHOL 145 04/11/2017   TRIG 98 04/11/2017   HDL 41 04/11/2017   LDLCALC 84 04/11/2017   ALT 145 (H) 07/14/2017   AST 235 (H) 07/14/2017   NA 139 07/14/2017   K 3.6 07/14/2017   CL 108 07/14/2017   CREATININE 1.03 (H) 07/14/2017   BUN 17 07/14/2017   CO2 24 07/14/2017   TSH 13.110 (H) 07/10/2017   INR 1.11 07/11/2017   HGBA1C 8.8 (H) 05/09/2017   Stable H/h slightly low Pericardial drain decreasing, likely remove in am   Grace Isaac MD  Beeper [phone] Office [phone] 07/14/2017 7:07 PM

## 2017-07-15 ENCOUNTER — Ambulatory Visit: Payer: Medicare Other | Admitting: Internal Medicine

## 2017-07-15 LAB — GLUCOSE, CAPILLARY
GLUCOSE-CAPILLARY: 209 mg/dL — AB (ref 65–99)
GLUCOSE-CAPILLARY: 153 mg/dL — AB (ref 65–99)
Glucose-Capillary: 147 mg/dL — ABNORMAL HIGH (ref 65–99)
Glucose-Capillary: 117 mg/dL — ABNORMAL HIGH (ref 65–99)
Glucose-Capillary: 124 mg/dL — ABNORMAL HIGH (ref 65–99)

## 2017-07-15 LAB — HEPATIC FUNCTION PANEL
ALT: 267 U/L — AB (ref 14–54)
AST: 742 U/L — ABNORMAL HIGH (ref 15–41)
Albumin: 1.8 g/dL — ABNORMAL LOW (ref 3.5–5.0)
Alkaline Phosphatase: 485 U/L — ABNORMAL HIGH (ref 38–126)
BILIRUBIN DIRECT: 0.5 mg/dL (ref 0.1–0.5)
BILIRUBIN INDIRECT: 0.7 mg/dL (ref 0.3–0.9)
BILIRUBIN TOTAL: 1.2 mg/dL (ref 0.3–1.2)
Total Protein: 4.3 g/dL — ABNORMAL LOW (ref 6.5–8.1)

## 2017-07-15 LAB — BASIC METABOLIC PANEL
Anion gap: 7 (ref 5–15)
BUN: 17 mg/dL (ref 6–20)
CO2: 23 mmol/L (ref 22–32)
Calcium: 7.3 mg/dL — ABNORMAL LOW (ref 8.9–10.3)
Chloride: 108 mmol/L (ref 101–111)
Creatinine, Ser: 0.92 mg/dL (ref 0.44–1.00)
GFR calc Af Amer: >60 mL/min (ref >60)
GFR calc non Af Amer: >60 mL/min (ref >60)
Glucose, Bld: 249 mg/dL — ABNORMAL HIGH (ref 65–99)
Potassium: 3.4 mmol/L — ABNORMAL LOW (ref 3.5–5.1)
Sodium: 138 mmol/L (ref 135–145)

## 2017-07-15 LAB — CBC
HCT: 24.3 % — ABNORMAL LOW (ref 36.0–46.0)
Hemoglobin: 8.5 g/dL — ABNORMAL LOW (ref 12.0–15.0)
MCH: 28.1 pg (ref 26.0–34.0)
MCHC: 35 g/dL (ref 30.0–36.0)
MCV: 80.2 fL (ref 78.0–100.0)
Platelets: 300 10*3/uL (ref 150–400)
RBC: 3.03 MIL/uL — ABNORMAL LOW (ref 3.87–5.11)
RDW: 16.7 % — ABNORMAL HIGH (ref 11.5–15.5)
WBC: 8.6 10*3/uL (ref 4.0–10.5)

## 2017-07-15 LAB — TYPE AND SCREEN
ABO/RH(D): O POS
Antibody Screen: NEGATIVE
Unit division: 0
Unit division: 0

## 2017-07-15 LAB — BPAM RBC
Blood Product Expiration Date: 201906112359
Blood Product Expiration Date: 201906112359
Unit Type and Rh: 5100
Unit Type and Rh: 5100

## 2017-07-15 MED ORDER — ACETAMINOPHEN 160 MG/5ML PO SOLN
650.0000 mg | Freq: Four times a day (QID) | ORAL | Status: AC
  Administered 2017-07-16 (×2): 650 mg via ORAL
  Filled 2017-07-15 (×2): qty 20.3

## 2017-07-15 MED ORDER — MORPHINE SULFATE (PF) 4 MG/ML IV SOLN: 1.0000 mg | INTRAVENOUS | Status: AC | PRN

## 2017-07-15 MED ORDER — POTASSIUM CHLORIDE CRYS ER 20 MEQ PO TBCR
40.0000 meq | EXTENDED_RELEASE_TABLET | Freq: Two times a day (BID) | ORAL | Status: AC
  Administered 2017-07-15 – 2017-07-16 (×3): 40 meq via ORAL
  Filled 2017-07-15 (×3): qty 2

## 2017-07-15 MED ORDER — ACETAMINOPHEN 325 MG PO TABS
650.0000 mg | ORAL_TABLET | Freq: Four times a day (QID) | ORAL | Status: AC
  Administered 2017-07-15 – 2017-07-17 (×5): 650 mg via ORAL
  Filled 2017-07-15 (×5): qty 2

## 2017-07-15 NOTE — Progress Notes (Signed)
3 Days Post-Op Procedure(s) (LRB): SUBXYPHOID PERICARDIAL WINDOW (N/A) Subjective: 40 cc output from perocardial drain Path on tissue, fluid pending DC drain and tx to stepdown   Objective: Vital signs in last 24 hours: Temp:  [97.4 F (36.3 C)-98.1 F (36.7 C)] 97.4 F (36.3 C) (05/20 0730) Pulse Rate:  [94] 94 (05/19 0800) Cardiac Rhythm: Normal sinus rhythm (05/20 0400) Resp:  [13-27] 15 (05/20 0600) BP: (92-132)/(43-88) 106/50 (05/20 0600) SpO2:  [90 %-100 %] 99 % (05/20 0600)  Hemodynamic parameters for last 24 hours:    Intake/Output from previous day: 05/19 0701 - 05/20 0700 In: 1500 [I.V.:1500] Out: 500 [Urine:350; Chest Tube:150] Intake/Output this shift: No intake/output data recorded.  Alert, comfortable nsr Decreased BS R base  Lab Results: Recent Labs    07/14/17 0619 07/15/17 0459  WBC 10.7* 8.6  HGB 8.4* 8.5*  HCT 23.8* 24.3*  PLT 341 300   BMET:  Recent Labs    07/14/17 0619 07/15/17 0459  NA 139 138  K 3.6 3.4*  CL 108 108  CO2 24 23  GLUCOSE 314* 249*  BUN 17 17  CREATININE 1.03* 0.92  CALCIUM 7.8* 7.3*    PT/INR: No results for input(s): LABPROT, INR in the last 72 hours. ABG    Component Value Date/Time   PHART 7.484 (H) 07/13/2017 0510   HCO3 23.4 07/13/2017 0510   TCO2 24 07/13/2017 0510   ACIDBASEDEF 11.0 (H) 07/12/2017 2241   O2SAT 98.0 07/13/2017 0510   CBG (last 3)  Recent Labs    07/14/17 1635 07/14/17 2120 07/15/17 0655  GLUCAP 140* 99 147*    Assessment/Plan: S/P Procedure(s) (LRB): SUBXYPHOID PERICARDIAL WINDOW (N/A) Mobilize Plan for transfer to step-down: see transfer orders DC pericardial drain and central line   LOS: 9 days    Brittany Mercado 07/15/2017

## 2017-07-15 NOTE — Progress Notes (Signed)
[facility] TEAM 1 - Stepdown/ICU TEAM  An Lannan  MWU:[id number] DOB: 04-11-1950 DOA: 07/06/2017 PCP: [name], DO    Brief Narrative:  67 year old female w/ a hx of stage IV non-small cell lung cancer/adenocarcinoma currently on IV Ketruda/immunotherapy every 3 weeks (Dr. [name]), poorly controlled DM2 with frequent episodes of DKA, HTN, and hypothyroidism who presented to the ED 07/06/2017 due to nausea and was found to be in DKA.  Her DKA initially resolved, but then she suffered recurrent DKA on 5/15.  She was also found to have a  postobstructive pneumonia, and a large pericardial effusion.  Subjective: Doing well post-chest tube removal.  Denies cp, sob, n/v, or abdom pain.      Assessment & Plan:  Moderate pericardial effusion - s/p pericardial window 5/17 Noted on CTA chest - compared to TTE 5/12 repeat TTE 5/15 suggested moderate to large pericardial effusion - s/p pericardial window - care per TCTS  Acute respiratory failure with hypoxia Secondary to lung cancer and postobstructive pneumonia - sats stable on minimal O2 support - wean O2 to RA   DKA / Uncontrolled DM2 A1c 3/14 was 8.8 - holding metformin and Amaryl - on 5/15 developed recurrent DKA - CBG well controlled at this time     Acute kidney injury secondary to dehydration - resolved w/ volume expansion   Lobar postobstructive pneumonia CTA chest 5/13 noted right lower lobe postobstructive pneumonia/pneumonitis - cont abx coverage to complete 7 days of tx - no clinical signs to suggest active infection at this time    Severe protein calorie malnutrition Cont home Megace  Normocytic anemia/anemia of chronic disease  Hgb stable  Elevated troponins Flat trend - no ischemic type chest pain - likely demand ischemia from acute illness - no further eval planned  Hypothyroidism TSH improved from 21.5 on 4/22 to 13.1 on 5/15 - follow w/o change for now - will need outpt f/u   Abnormal LFTs No  liver lesions noted on CT abdom March 2019 - ? low grade shock liver - levels are fluctuating - follow intermittently   Stage IV non-small cell lung cancer/adenocarcinoma Care per Dr. Inda Merlin   HTN Not an active issue presently   Adult failure to thrive - Severe malnutrition in the context of chronic illness stage IV lung cancer, postobstructive pneumonia, pericardial effusion, severe malnutrition  Severe PAD ASA continues - Plavix on hold   DVT prophylaxis: SCDs Code Status: FULL CODE Family Communication: No family present at time of exam  Disposition Plan: to transfer to tele bed today     Consultants:  TCTS  Antimicrobials:  levaquin 5/14 > 5/20  Objective: Blood pressure (!) 106/50, pulse 94, temperature (!) 97.4 F (36.3 C), temperature source Oral, resp. rate 15, height 5\' 3"  (1.6 m), weight 42.4 kg (93 lb 7.6 oz), SpO2 99 %.  Intake/Output Summary (Last 24 hours) at 07/15/2017 0922 Last data filed at 07/15/2017 0900 Gross per 24 hour  Intake 1890 ml  Output 530 ml  Net 1360 ml   Filed Weights   07/11/17 1758 07/12/17 1404 07/13/17 0600  Weight: 38 kg (83 lb 12.4 oz) 37.6 kg (83 lb) 42.4 kg (93 lb 7.6 oz)    Examination: General: no acute respiratory distress - alert and conversant  Lungs: CTA th/o - blunting of breath sounds B bases - no wheezing  CV:  No M or rub - RRR Abdom:  Thin, NT/NS, soft, bs +, no mass  Extremities: no significant edema B LE  CBC: Recent Labs  Lab 07/13/17 0505 07/14/17 0619 07/15/17 0459  WBC 9.9 10.7* 8.6  HGB 9.1* 8.4* 8.5*  HCT 25.4* 23.8* 24.3*  MCV 79.6 79.1 80.2  PLT 357 341 976   Basic Metabolic Panel: Recent Labs  Lab 07/10/17 1223  07/11/17 0506  07/13/17 0505 07/13/17 2152 07/14/17 0619 07/15/17 0459  NA 137   < > 140   < > 142  --  139 138  K 3.6   < > 3.6   < > 3.7  --  3.6 3.4*  CL 106   < > 112*   < > 110  --  108 108  CO2 14*   < > 21*   < > 20*  --  24 23  GLUCOSE 336*   < > 54*   < > 182*  498* 314* 249*  BUN 25*   < > 19   < > 14  --  17 17  CREATININE 1.28*   < > 0.85   < > 0.96  --  1.03* 0.92  CALCIUM 8.4*   < > 7.8*   < > 7.7*  --  7.8* 7.3*  MG 1.3*  --  2.1  --   --   --   --   --    < > = values in this interval not displayed.   GFR: Estimated Creatinine Clearance: 40.3 mL/min (by C-G formula based on SCr of 0.92 mg/dL).  Liver Function Tests: Recent Labs  Lab 07/10/17 1223 07/11/17 1950 07/14/17 0619 07/15/17 0459  AST 517* 631* 235* 742*  ALT 267* 255* 145* 267*  ALKPHOS 594* 752* 512* 485*  BILITOT 1.9* 0.8 1.1 1.2  PROT 6.5 5.9* 4.7* 4.3*  ALBUMIN 2.4* 2.2* 2.0* 1.8*    Coagulation Profile: Recent Labs  Lab 07/11/17 1950  INR 1.11    HbA1C: Hgb A1c MFr Bld  Date/Time Value Ref Range Status  05/09/2017 08:06 AM 8.8 (H) 4.8 - 5.6 % Final    Comment:    (NOTE) Pre diabetes:          5.7%-6.4% Diabetes:              >6.4% Glycemic control for   <7.0% adults with diabetes   05/08/2017 11:08 AM 8.8 (H) 4.8 - 5.6 % Final    Comment:    (NOTE) Pre diabetes:          5.7%-6.4% Diabetes:              >6.4% Glycemic control for   <7.0% adults with diabetes     CBG: Recent Labs  Lab 07/14/17 0629 07/14/17 1116 07/14/17 1635 07/14/17 2120 07/15/17 0655  GLUCAP 282* 209* 140* 99 147*    Recent Results (from the past 240 hour(s))  Culture, blood (routine x 2)     Status: None   Collection Time: 07/06/17  6:40 PM  Result Value Ref Range Status   Specimen Description   Final    BLOOD RIGHT ANTECUBITAL Performed at [facility], [address] [place], [place] [address]    Special Requests   Final    BOTTLES DRAWN AEROBIC AND ANAEROBIC Blood Culture adequate volume Performed at [facility], [address] [place], [place] [address]    Culture   Final    NO GROWTH 5 DAYS Performed at [facility], [address] [place], [place] [address]    Report Status 07/11/2017 FINAL  Final  MRSA PCR  Screening     Status: None   Collection Time: 07/06/17  8:48 PM  Result Value Ref Range Status   MRSA by PCR NEGATIVE NEGATIVE Final    Comment:        The GeneXpert MRSA Assay (FDA approved for NASAL specimens only), is one component of a comprehensive MRSA colonization surveillance program. It is not intended to diagnose MRSA infection nor to guide or monitor treatment for MRSA infections. Performed at [facility], [place] [address], [place], [place] [address]   Aerobic/Anaerobic Culture (surgical/deep wound)     Status: None (Preliminary result)   Collection Time: 07/12/17  4:35 PM  Result Value Ref Range Status   Specimen Description PERICARDIAL  Final   Special Requests NONE  Final   Gram Stain   Final    RARE WBC PRESENT, PREDOMINANTLY MONONUCLEAR NO ORGANISMS SEEN    Culture   Final    NO GROWTH 2 DAYS NO ANAEROBES ISOLATED; CULTURE IN PROGRESS FOR 5 DAYS Performed at [facility], [place] [address], [place], [place] [address]    Report Status PENDING  Incomplete  MRSA PCR Screening     Status: None   Collection Time: 07/12/17  8:49 PM  Result Value Ref Range Status   MRSA by PCR NEGATIVE NEGATIVE Final    Comment:        The GeneXpert MRSA Assay (FDA approved for NASAL specimens only), is one component of a comprehensive MRSA colonization surveillance program. It is not intended to diagnose MRSA infection nor to guide or monitor treatment for MRSA infections. Performed at [facility], [place] [address], [place], [place] [address]      Scheduled Meds: . acetaminophen  1,000 mg Oral Q6H   Or  . acetaminophen (TYLENOL) oral liquid 160 mg/5 mL  1,000 mg Oral Q6H  . aspirin EC  81 mg Oral Daily  . bisacodyl  10 mg Oral Daily  . Chlorhexidine Gluconate Cloth  6 each Topical Daily  . feeding supplement (GLUCERNA 1.2 CAL)  237 mL Oral Q2000  . feeding supplement (PRO-STAT SUGAR FREE 64)  30 mL Oral BID  . ferrous sulfate  325 mg Oral  Q1200  . guaiFENesin  1,200 mg Oral BID  . insulin aspart  0-15 Units Subcutaneous TID WC  . insulin aspart  0-5 Units Subcutaneous QHS  . insulin glargine  10 Units Subcutaneous QHS  . levofloxacin  750 mg Oral Q48H  . levothyroxine  100 mcg Oral QAC breakfast  . mouth rinse  15 mL Mouth Rinse BID  . megestrol  800 mg Oral Daily  . multivitamin with minerals  1 tablet Oral Q1200  . senna-docusate  1 tablet Oral QHS  . sodium chloride flush  10-40 mL Intracatheter Q12H     LOS: 9 days   Cherene Altes, MD [facility] Office  [phone] Pager - Text Page per Amion as per below:  On-Call/Text Page:      [email or web address]      password [email or web address]  If 7PM-7AM, please contact night-coverage [email or web address] Password [email or web address] 07/15/2017, 9:22 AM

## 2017-07-16 ENCOUNTER — Inpatient Hospital Stay (HOSPITAL_COMMUNITY): Payer: Medicare Other

## 2017-07-16 ENCOUNTER — Other Ambulatory Visit: Payer: Self-pay

## 2017-07-16 DIAGNOSIS — R531 Weakness: Secondary | ICD-10-CM

## 2017-07-16 DIAGNOSIS — R740 Nonspecific elevation of levels of transaminase and lactic acid dehydrogenase [LDH]: Secondary | ICD-10-CM

## 2017-07-16 LAB — BASIC METABOLIC PANEL
ANION GAP: 5 (ref 5–15)
BUN: 12 mg/dL (ref 6–20)
CHLORIDE: 110 mmol/L (ref 101–111)
CO2: 23 mmol/L (ref 22–32)
Calcium: 7.5 mg/dL — ABNORMAL LOW (ref 8.9–10.3)
Creatinine, Ser: 0.95 mg/dL (ref 0.44–1.00)
GFR calc Af Amer: >60 mL/min (ref >60)
GLUCOSE: 231 mg/dL — AB (ref 65–99)
POTASSIUM: 4.4 mmol/L (ref 3.5–5.1)
Sodium: 138 mmol/L (ref 135–145)

## 2017-07-16 LAB — GLUCOSE, CAPILLARY
GLUCOSE-CAPILLARY: 167 mg/dL — AB (ref 65–99)
GLUCOSE-CAPILLARY: 39 mg/dL — AB (ref 65–99)
GLUCOSE-CAPILLARY: 67 mg/dL (ref 65–99)
GLUCOSE-CAPILLARY: 191 mg/dL — AB (ref 65–99)
GLUCOSE-CAPILLARY: 261 mg/dL — AB (ref 65–99)
Glucose-Capillary: 207 mg/dL — ABNORMAL HIGH (ref 65–99)

## 2017-07-16 LAB — CBC
HCT: 26.9 % — ABNORMAL LOW (ref 36.0–46.0)
Hemoglobin: 9.4 g/dL — ABNORMAL LOW (ref 12.0–15.0)
MCH: 28.2 pg (ref 26.0–34.0)
MCHC: 34.9 g/dL (ref 30.0–36.0)
MCV: 80.8 fL (ref 78.0–100.0)
PLATELETS: 340 10*3/uL (ref 150–400)
RBC: 3.33 MIL/uL — ABNORMAL LOW (ref 3.87–5.11)
RDW: 17.4 % — AB (ref 11.5–15.5)
WBC: 11.4 10*3/uL — AB (ref 4.0–10.5)

## 2017-07-16 LAB — MAGNESIUM: Magnesium: 1.5 mg/dL — ABNORMAL LOW (ref 1.7–2.4)

## 2017-07-16 LAB — HEPATIC FUNCTION PANEL
ALBUMIN: 1.8 g/dL — AB (ref 3.5–5.0)
ALT: 244 U/L — ABNORMAL HIGH (ref 14–54)
AST: 398 U/L — AB (ref 15–41)
Alkaline Phosphatase: 556 U/L — ABNORMAL HIGH (ref 38–126)
BILIRUBIN DIRECT: 0.6 mg/dL — AB (ref 0.1–0.5)
BILIRUBIN INDIRECT: 0.5 mg/dL (ref 0.3–0.9)
TOTAL PROTEIN: 4.3 g/dL — AB (ref 6.5–8.1)
Total Bilirubin: 1.1 mg/dL (ref 0.3–1.2)

## 2017-07-16 MED ORDER — MAGNESIUM OXIDE 400 (241.3 MG) MG PO TABS
800.0000 mg | ORAL_TABLET | Freq: Once | ORAL | Status: AC
  Administered 2017-07-16: 800 mg via ORAL
  Filled 2017-07-16: qty 2

## 2017-07-16 MED ORDER — POLYETHYLENE GLYCOL 3350 17 G PO PACK
17.0000 g | PACK | Freq: Every day | ORAL | Status: DC
  Filled 2017-07-16 (×2): qty 1

## 2017-07-16 MED ORDER — MAGNESIUM OXIDE 400 (241.3 MG) MG PO TABS: 800.0000 mg | ORAL_TABLET | Freq: Two times a day (BID) | ORAL | Status: DC

## 2017-07-16 NOTE — Patient Outreach (Signed)
[facility]) Care Management  07/16/2017  Jaquala Fuller 1950/05/05 [id number]  Patient was admitted to hospital on 07/06/17. Upon reviewing patients chart it appears patient is still currently admitted. BSW to close case due to patients length of stay exceeding 10 days. BSW to notify hospital liaison of case closure.  Daneen Schick, BSW, CDP Social Worker Cell # [phone] [email or web address]

## 2017-07-16 NOTE — Evaluation (Signed)
Physical Therapy Evaluation Patient Details Name: Brittany Mercado MRN: [id number] DOB: 1950/06/21 Today's Date: 07/16/2017   History of Present Illness  67 year old female w/ a hx of stage IV non-small cell lung cancer/adenocarcinoma currently on IV Ketruda/immunotherapy every 3 weeks (Dr. [name]), poorly controlled DM2 with frequent episodes of DKA, HTN, and hypothyroidism who presented to the ED 07/06/2017 due to nausea and was found to be in DKA.Her DKA initially resolved, but then she suffered recurrent DKA on 5/15.  She was also found to have a  postobstructive pneumonia, and a large pericardial effusion. Subxiphoid pericardial window on 07/12/17.    Clinical Impression  Pt admitted with above diagnosis. Pt currently with functional limitations due to the deficits listed below (see PT Problem List). Pt was able to ambulate with RW with min assist and cues.  Pt with very poor endurance and fatigues very quickly.  Pt could not ambulate again after the 50 feet as she was exhausted.  O2 sats >90% during treatment with HR 103-136 bpm.   Pt will benefit from SNF as it appears she does not have 24 hour care.  Pt will benefit from skilled PT to increase their independence and safety with mobility to allow discharge to the venue listed below.      Follow Up Recommendations SNF;Supervision/Assistance - 24 hour    Equipment Recommendations  3in1 (PT)    Recommendations for Other Services       Precautions / Restrictions Precautions Precautions: Fall Precaution Comments: monitor vitals Restrictions Weight Bearing Restrictions: No      Mobility  Bed Mobility Overal bed mobility: Needs Assistance Bed Mobility: Supine to Sit;Sit to Supine     Supine to sit: Min assist;HOB elevated     General bed mobility comments: Noted pt soaked with urine upon sitting up.  Got washcloths and pt and PT washed pt and changed her gowns.  Pt was able to wash her UB and part of her LB.  This PT washed her back and  buttocks and pt was able to wash her perineal area in standing with guard assist. Assist for trunk and LEs. Increased time. visibly fatigued after sitting up.   Transfers Overall transfer level: Needs assistance Equipment used: Rolling walker (2 wheeled) Transfers: Sit to/from Stand Sit to Stand: Min assist         General transfer comment: Assist to rise, stabilize, control descent. VCs hand placement. Easily fatigued with activity  Ambulation/Gait Ambulation/Gait assistance: Min assist Ambulation Distance (Feet): 50 Feet Assistive device: Rolling walker (2 wheeled) Gait Pattern/deviations: Step-through pattern;Decreased stride length;Trunk flexed;Drifts right/left;Antalgic   Gait velocity interpretation: <1.31 ft/sec, indicative of household ambulator General Gait Details: Pt was able to ambulate with RW to door and then back around bed to the recliner.  Trunk flexed slightly initially but as she fatigues, she flexes more.  Pt with very poor endurance for activity.  Close guard assist needed for safety as pt needs help steering RW as well as cues to stay close to RW.   Stairs            Wheelchair Mobility    Modified Rankin (Stroke Patients Only)       Balance Overall balance assessment: Needs assistance Sitting-balance support: No upper extremity supported;Feet supported Sitting balance-Leahy Scale: Fair     Standing balance support: Bilateral upper extremity supported Standing balance-Leahy Scale: Poor Standing balance comment: relies on UE support  Pertinent Vitals/Pain Pain Assessment: Faces Faces Pain Scale: Hurts whole lot Pain Location: abdomen Pain Descriptors / Indicators: Grimacing;Guarding Pain Intervention(s): Limited activity within patient's tolerance;Monitored during session;Repositioned    Home Living Family/patient expects to be discharged to:: Private residence Living Arrangements: Children Available  Help at Discharge: Family;Available PRN/intermittently Type of Home: House       Home Layout: Two level Home Equipment: Walker - 2 wheels      Prior Function Level of Independence: Independent               Hand Dominance        Extremity/Trunk Assessment   Upper Extremity Assessment Upper Extremity Assessment: Defer to OT evaluation    Lower Extremity Assessment Lower Extremity Assessment: Generalized weakness    Cervical / Trunk Assessment Cervical / Trunk Assessment: Kyphotic  Communication   Communication: No difficulties  Cognition Arousal/Alertness: Awake/alert Behavior During Therapy: WFL for tasks assessed/performed Overall Cognitive Status: Within Functional Limits for tasks assessed                                        General Comments      Exercises     Assessment/Plan    PT Assessment Patient needs continued PT services  PT Problem List Decreased strength;Decreased balance;Decreased mobility;Decreased activity tolerance;Decreased knowledge of use of DME;Cardiopulmonary status limiting activity       PT Treatment Interventions DME instruction;Gait training;Functional mobility training;Therapeutic activities;Balance training;Patient/family education;Therapeutic exercise    PT Goals (Current goals can be found in the Care Plan section)  Acute Rehab PT Goals Patient Stated Goal: to feel better PT Goal Formulation: With patient Time For Goal Achievement: 07/30/17 Potential to Achieve Goals: Good    Frequency Min 3X/week   Barriers to discharge Decreased caregiver support      Co-evaluation               AM-PAC PT "6 Clicks" Daily Activity  Outcome Measure Difficulty turning over in bed (including adjusting bedclothes, sheets and blankets)?: A Lot Difficulty moving from lying on back to sitting on the side of the bed? : A Lot Difficulty sitting down on and standing up from a chair with arms (e.g., wheelchair,  bedside commode, etc,.)?: A Lot Help needed moving to and from a bed to chair (including a wheelchair)?: A Lot Help needed walking in hospital room?: A Lot Help needed climbing 3-5 steps with a railing? : Total 6 Click Score: 11    End of Session Equipment Utilized During Treatment: Gait belt Activity Tolerance: Patient limited by fatigue Patient left: with call bell/phone within reach;in chair;with chair alarm set Nurse Communication: Mobility status PT Visit Diagnosis: Difficulty in walking, not elsewhere classified (R26.2);Muscle weakness (generalized) (M62.81)    Time: 7425-9563 PT Time Calculation (min) (ACUTE ONLY): 29 min   Charges:   PT Evaluation $PT Eval Moderate Complexity: 1 Mod PT Treatments $Gait Training: 8-22 mins   PT G Codes:        Aveon Colquhoun,PT Acute Rehabilitation [phone] (pager)   Denice Paradise 07/16/2017, 8:23 AM

## 2017-07-16 NOTE — Progress Notes (Signed)
Hypoglycemic Event  CBG: 39  Treatment: 15 GM carbohydrate snack  Symptoms: None  Follow-up CBG: Time:1223 CBG Result:67  Possible Reasons for Event: Inadequate meal intake  Comments/MD notified:Diabetes coordinator and family present in room.  Pt asymptomatic. Gave juice and sitting up eating lunch. Pt and family aware that she needs to eat and not be concerned about carbs at this time. Pt resting with call bell within reach.  Will continue to monitor.     Payton Emerald

## 2017-07-16 NOTE — Progress Notes (Signed)
Inpatient Diabetes Program Recommendations  AACE/ADA: New Consensus Statement on Inpatient Glycemic Control (2015)  Target Ranges:  Prepandial:   less than 140 mg/dL      Peak postprandial:   less than 180 mg/dL (1-2 hours)      Critically ill patients:  140 - 180 mg/dL   Lab Results  Component Value Date   GLUCAP 39 (LL) 07/16/2017   HGBA1C 8.8 (H) 05/09/2017    Review of Glycemic Control  Diabetes history:  Outpatient Diabetes medications: Lantus 6-10 units QHS, Humalog 3 units with meals. Current orders for Inpatient glycemic control: Lantus 10 units QHS, Novolog 0-15 units tidwc and hs  Long discussion with pt, son, and daughter regarding her diabetes diagnosis. Family would like referral to endo for diabetes and hx DKA.   Inpatient Diabetes Program Recommendations:     May benefit from addition of Novolog 2 units tidwc if pt eats > 50% meal. Will need basal and bolus s/s insulin at discharge.  Continue to follow.  Thank you. Lorenda Peck, RD, LDN, CDE Inpatient Diabetes Coordinator [phone]

## 2017-07-16 NOTE — Evaluation (Signed)
Occupational Therapy Evaluation Patient Details Name: Brittany Mercado MRN: [id number] DOB: January 28, 1951 Today's Date: 07/16/2017    History of Present Illness 67 year old female w/ a hx of stage IV non-small cell lung cancer/adenocarcinoma currently on IV Ketruda/immunotherapy every 3 weeks (Dr. [name]), poorly controlled DM2 with frequent episodes of DKA, HTN, and hypothyroidism who presented to the ED 07/06/2017 due to nausea and was found to be in DKA.Her DKA initially resolved, but then she suffered recurrent DKA on 5/15.  She was also found to have a  postobstructive pneumonia, and a large pericardial effusion. Subxiphoid pericardial window on 07/12/17.     Clinical Impression   Pt admitted with above diagnosis. Pt currently with functional limitations due to the deficits listed below (see OT Problem List). Pt has a daughter and son at home but both work- not sure she would have 24/7 A.  Pt will benefit from skilled OT to increase their safety and independence with ADL and functional mobility for ADL to facilitate discharge to venue listed below.      Follow Up Recommendations  SNF;Supervision/Assistance - 24 hour - educated pt on need for rehab. Pt seemed agreeable   Equipment Recommendations  None recommended by OT    Recommendations for Other Services       Precautions / Restrictions Precautions Precautions: Fall Precaution Comments: monitor vitals Restrictions Weight Bearing Restrictions: No      Mobility Bed Mobility Overal bed mobility: Needs Assistance Bed Mobility: Supine to Sit;Sit to Supine     Supine to sit: Min assist;HOB elevated Sit to supine: Mod assist;HOB elevated   General bed mobility comments: Noted pt soaked with urine upon sitting up.  Got washcloths and pt and PT washed pt and changed her gowns.  Pt was able to wash her UB and part of her LB.  This PT washed her back and buttocks and pt was able to wash her perineal area in standing with guard assist.  Assist for trunk and LEs. Increased time. visibly fatigued after sitting up.   Transfers Overall transfer level: Needs assistance Equipment used: 1 person hand held assist Transfers: Sit to/from [facility] Sit to Stand: Min assist Stand pivot transfers: Min assist       General transfer comment: fatigued easily    Balance Overall balance assessment: Needs assistance Sitting-balance support: No upper extremity supported;Feet supported Sitting balance-Leahy Scale: Fair     Standing balance support: Bilateral upper extremity supported Standing balance-Leahy Scale: Poor Standing balance comment: relies on UE support                           ADL either performed or assessed with clinical judgement   ADL Overall ADL's : Needs assistance/impaired Eating/Feeding: Sitting;Set up   Grooming: Minimal assistance;Sitting                   Toilet Transfer: Minimal assistance;BSC;Stand-pivot   Toileting- Clothing Manipulation and Hygiene: Minimal assistance;Sit to/from stand         General ADL Comments: Pt fatigued VERY quickly.  Pt returned to supine     Vision Patient Visual Report: No change from baseline       Perception     Praxis      Pertinent Vitals/Pain Pain Assessment: Faces Faces Pain Scale: Hurts whole lot Pain Location: abdomen Pain Descriptors / Indicators: Grimacing;Guarding Pain Intervention(s): Limited activity within patient's tolerance;Monitored during session;Repositioned     Hand Dominance  Extremity/Trunk Assessment Upper Extremity Assessment Upper Extremity Assessment: Generalized weakness   Lower Extremity Assessment Lower Extremity Assessment: Generalized weakness   Cervical / Trunk Assessment Cervical / Trunk Assessment: Kyphotic   Communication Communication Communication: No difficulties   Cognition Arousal/Alertness: Awake/alert Behavior During Therapy: WFL for tasks  assessed/performed Overall Cognitive Status: Within Functional Limits for tasks assessed                                                Home Living Family/patient expects to be discharged to:: Private residence Living Arrangements: Children Available Help at Discharge: Family;Available PRN/intermittently Type of Home: House       Home Layout: Two level Alternate Level Stairs-Number of Steps: pt's bedroom in basement with 14 steps (pt stated she hasn't been climbing steps as of late)             Home Equipment: Walker - 2 wheels          Prior Functioning/Environment Level of Independence: Independent                 OT Problem List: Decreased strength;Decreased activity tolerance;Impaired balance (sitting and/or standing);Cardiopulmonary status limiting activity;Decreased knowledge of use of DME or AE;Decreased safety awareness      OT Treatment/Interventions: Self-care/ADL training;Energy conservation;Patient/family education    OT Goals(Current goals can be found in the care plan section) Acute Rehab OT Goals Patient Stated Goal: to feel better OT Goal Formulation: With patient Time For Goal Achievement: 07/23/17 ADL Goals Pt Will Perform Grooming: standing;with supervision Pt Will Perform Upper Body Dressing: sitting Pt Will Perform Lower Body Dressing: sit to/from stand Pt Will Transfer to Toilet: with supervision;regular height toilet;ambulating Pt Will Perform Toileting - Clothing Manipulation and hygiene: with supervision;sit to/from stand Additional ADL Goal #1: Pt will verbalize and demonstrate energy conservation strategies with ADL activity with handout provided at mod I level  OT Frequency: Min 2X/week   Barriers to D/C:            Co-evaluation              AM-PAC PT "6 Clicks" Daily Activity     Outcome Measure Help from another person eating meals?: A Little Help from another person taking care of personal  grooming?: A Little Help from another person toileting, which includes using toliet, bedpan, or urinal?: A Lot Help from another person bathing (including washing, rinsing, drying)?: A Lot Help from another person to put on and taking off regular upper body clothing?: A Little Help from another person to put on and taking off regular lower body clothing?: A Lot 6 Click Score: 15   End of Session Nurse Communication: Mobility status  Activity Tolerance: Patient limited by lethargy Patient left: in bed;with call bell/phone within reach  OT Visit Diagnosis: Unsteadiness on feet (R26.81);History of falling (Z91.81);Muscle weakness (generalized) (M62.81)                Time: 0932-6712 OT Time Calculation (min): 10 min Charges:  OT General Charges $OT Visit: 1 Visit OT Evaluation $OT Eval Moderate Complexity: 1 Mod G-Codes:     Kari Baars, [place]  Payton Mccallum D 07/16/2017, 9:46 AM

## 2017-07-16 NOTE — Clinical Social Work Note (Signed)
Clinical Social Work Assessment  Patient Details  Name: Brittany Mercado MRN: [id number] Date of Birth: 10-28-1950  Date of referral:  07/16/17               Reason for consult:  Facility Placement, Discharge Planning                Permission sought to share information with:  Family Supports Permission granted to share information::  Yes, Verbal Permission Granted  Name::     Brittany Mercado  Agency::  snf  Relationship::  son  Contact Information:  [phone]  Housing/Transportation Living arrangements for the past 2 months:  [place] of Information:  Patient Patient Interpreter Needed:  None Criminal Activity/Legal Involvement Pertinent to Current Situation/Hospitalization:    Significant Relationships:  Adult Children, Other Family Members Lives with:  Adult Children Do you feel safe going back to the place where you live?  Yes Need for family participation in patient care:  Yes (Comment)  Care giving concerns:  No family at bedside. Patient stated her daughter in law was in the room earlier but had to leave. Patient stated she has decided to go to rehab and requested she be [facility].  Social Worker assessment / plan:  CSW met patient at bedside to discuss discharge plan. Patient stated she lives with her son and daughter in law. Patient stated that family is supportive of her and her needs. Patient stated she would like to go to [facility]. CSW stated that she will try her best to get patient into facility but stated it depends on bed availability.     Employment status:  Retired Nurse, adult PT Recommendations:    Information / Referral to community resources:  [place]  Patient/Family's Response to care:  Patient appreciates CSW role in care  Patient/Family's Understanding of and Emotional Response to Diagnosis, Current Treatment, and Prognosis:  Patient agreeable to go to SNF. CSW will  follow up with patient once bed is available   Emotional Assessment Appearance:  Appears older than stated age Attitude/Demeanor/Rapport:  Engaged Affect (typically observed):  Accepting Orientation:  Oriented to Place, Oriented to Self, Oriented to Situation, Oriented to  Time Alcohol / Substance use:    Psych involvement (Current and /or in the community):  No (Comment)  Discharge Needs  Concerns to be addressed:  No discharge needs identified Readmission within the last 30 days:  No Current discharge risk:  None Barriers to Discharge:  No SNF bed, [place], LCSW 07/16/2017, 3:27 PM

## 2017-07-16 NOTE — Progress Notes (Addendum)
      [address]       [place] [address]             [phone]      4 Days Post-Op Procedure(s) (LRB): SUBXYPHOID PERICARDIAL WINDOW (N/A) Subjective: Feels okay this morning, tired.   Objective: Vital signs in last 24 hours: Temp:  [97.5 F (36.4 C)-98.1 F (36.7 C)] 98.1 F (36.7 C) (05/20 1900) Pulse Rate:  [63] 63 (05/20 1900) Cardiac Rhythm: Sinus tachycardia;Normal sinus rhythm (05/20 2014) Resp:  [13-30] 30 (05/20 1900) BP: (97-146)/(45-60) 114/45 (05/20 1900) SpO2:  [83 %-100 %] 98 % (05/20 1900) FiO2 (%):  [1 %] 1 % (05/20 1900)     Intake/Output from previous day: 05/20 0701 - 05/21 0700 In: 510 [P.O.:360; I.V.:150] Out: 30 [Chest Tube:30] Intake/Output this shift: No intake/output data recorded.  General appearance: alert, cooperative and no distress Heart: regular rate and rhythm, S1, S2 normal, no murmur, click, rub or gallop Lungs: clear to auscultation bilaterally Abdomen: soft, non-tender; bowel sounds normal; no masses,  no organomegaly Extremities: 1-2+ pitting pedal edema Wound: clean and dry  Lab Results: Recent Labs    07/15/17 0459 07/16/17 0419  WBC 8.6 11.4*  HGB 8.5* 9.4*  HCT 24.3* 26.9*  PLT 300 340   BMET:  Recent Labs    07/15/17 0459 07/16/17 0419  NA 138 138  K 3.4* 4.4  CL 108 110  CO2 23 23  GLUCOSE 249* 231*  BUN 17 12  CREATININE 0.92 0.95  CALCIUM 7.3* 7.5*    PT/INR: No results for input(s): LABPROT, INR in the last 72 hours. ABG    Component Value Date/Time   PHART 7.484 (H) 07/13/2017 0510   HCO3 23.4 07/13/2017 0510   TCO2 24 07/13/2017 0510   ACIDBASEDEF 11.0 (H) 07/12/2017 2241   O2SAT 98.0 07/13/2017 0510   CBG (last 3)  Recent Labs    07/15/17 1629 07/15/17 2131 07/16/17 0605  GLUCAP 153* 124* 167*    Assessment/Plan: S/P Procedure(s) (LRB): SUBXYPHOID PERICARDIAL WINDOW (N/A)  1. CXR this morning shows small left pleural effusion. S/p pericardial drain removal  yesterday. 2. NSR in the 90s. BP well controlled.  3. Creatinine 0.95, potassium okay. Magnesium low at 1.5, will leave up to primary to replace.  4.  H and H increasing 9.4/26.9 today, platelets 340k 5.  Severe malnutrition-on megace, glucerna,  and a multivitamin. BMI 16.5  Plan: continue medical care. Working with physical therapy this morning.     LOS: 10 days    Elgie Collard 07/16/2017  pericardial biopsy- no malignancy. Pericardial fluid cytology still pending  patient examined and medical record reviewed,agree with above note. Tharon Aquas Trigt III 07/16/2017

## 2017-07-16 NOTE — Progress Notes (Signed)
PROGRESS NOTE    Brittany Mercado  GHW:[id number] DOB: [birth year] DOA: 07/06/2017 PCP: [name], DO   Brief Narrative:  67 year old female with history of stage IV non-small cell lung adenocarcinoma, poorly controlled diabetes mellitus type 2, essential hypertension, hypothyroidism came to the hospital on 5/11 for evaluation of nausea.  She was found to have diabetic ketoacidosis.  She suffered from DKA again and protocol and to be reinitiated on 5/15.  Hospital course was complicated by postobstructive pneumonia and was found to have a large pericardial effusion.  Pericardial window was placed on 5/17.   Assessment & Plan:   Active Problems:   Diabetes mellitus type 2 in nonobese (HCC)   DKA (diabetic ketoacidoses) (HCC)   Hypokalemia   Anemia   Renal insufficiency   Shortness of breath   Pericardial effusion  Acute respiratory distress with hypoxia, resolved Postobstructive pneumonia Stage IV non small cell lung adenocarcinoma -Follows with Dr. [name] outpatient, and pneumonia has been treated with 7 days of antibiotics with Levaquin - Can get nebulizers as necessary - Provide supportive care, supplemental oxygen as necessary  Moderate pericardial effusion status post pericardial window 5/17 -This was noted on the CT of the chest.  Pericardial window was placed by cardiothoracic surgery, drain was removed on 5/20.  Pericardial biopsy is negative for malignancy but fluid cytology is pending.  Uncontrolled diabetes mellitus type 2-insulin-dependent Diabetic ketoacidosis asked to, resolved - Currently she is on sliding scale, Lantus 10 units at bedtime.  Aspart 3 units before meals -I will consult with diabetic coordinator to help provide education.  Spoke with the patient's family who thinks patient is not well educated on this.  Iron deficiency anemia -Continue iron supplements daily  Transaminitis - Appears to be secondary to shock liver.  LFTs are trending down,  will closely monitor this  Moderate to severe protein calorie malnutrition Adult failure to thrive Generalized weakness and deconditioning -Patient is on Megace daily -physical therapy and Occupational Therapy-recommend skilled nursing facility  Hypothyroidism -Continue Synthroid 100 mcg daily, TSH on 5/15 was 13.1  Severe peripheral arterial disease -Continue aspirin.  Plavix is currently on hold  DVT prophylaxis: SCDs Code Status: Full code Family Communication: Spoke with family members over the phone Disposition Plan: Maintain inpatient stay for another 24-48 hours  Consultants:   Cardiothoracic surgery  Antimicrobials:   Levaquin 7-day course completed 5/20   Subjective: Patient is any complaints today.  Review of Systems Otherwise negative except as per HPI, including: General: Denies fever, chills, night sweats or unintended weight loss. Resp: Denies cough, wheezing, shortness of breath. Cardiac: Denies chest pain, palpitations, orthopnea, paroxysmal nocturnal dyspnea. GI: Denies abdominal pain, nausea, vomiting, diarrhea or constipation GU: Denies dysuria, frequency, hesitancy or incontinence MS: Denies muscle aches, joint pain or swelling Neuro: Denies headache, neurologic deficits (focal weakness, numbness, tingling), abnormal gait Psych: Denies anxiety, depression, SI/HI/AVH Skin: Denies new rashes or lesions ID: Denies sick contacts, exotic exposures, travel  Objective: Vitals:   07/15/17 1400 07/15/17 1500 07/15/17 1523 07/15/17 1900  BP: (!) 100/52 (!) 113/56  (!) 114/45  Pulse:    63  Resp: 14 (!) 28  (!) 30  Temp:    98.1 F (36.7 C)  TempSrc:    Oral  SpO2: 100% 100% 96% 98%  Weight:      Height:        Intake/Output Summary (Last 24 hours) at 07/16/2017 1141 Last data filed at 07/16/2017 0730 Gross per 24 hour  Intake 360 ml  Output -  Net 360 ml   Filed Weights   07/11/17 1758 07/12/17 1404 07/13/17 0600  Weight: 38 kg (83 lb 12.4  oz) 37.6 kg (83 lb) 42.4 kg (93 lb 7.6 oz)    Examination:  General exam: Appears calm and comfortable, very frail appearing, bilateral temporal wasting Respiratory system: Diminished breath sounds at the bases Cardiovascular system: S1 & S2 heard, RRR. No JVD, murmurs, rubs, gallops or clicks. No pedal edema. Gastrointestinal system: Abdomen is nondistended, soft and nontender. No organomegaly or masses felt. Normal bowel sounds heard. Central nervous system: Alert and oriented. No focal neurological deficits. Extremities: Symmetric 4 x 5 power in all 4 ext. . Skin: No rashes, lesions or ulcers Psychiatry: Judgement and insight appear normal. Mood & affect appropriate.     Data Reviewed:   CBC: Recent Labs  Lab 07/11/17 1950 07/13/17 0505 07/14/17 0619 07/15/17 0459 07/16/17 0419  WBC 7.0 9.9 10.7* 8.6 11.4*  HGB 9.0* 9.1* 8.4* 8.5* 9.4*  HCT 26.6* 25.4* 23.8* 24.3* 26.9*  MCV 82.1 79.6 79.1 80.2 80.8  PLT 260 357 341 300 086   Basic Metabolic Panel: Recent Labs  Lab 07/10/17 1223  07/11/17 0506 07/11/17 1950 07/13/17 0505 07/13/17 2152 07/14/17 0619 07/15/17 0459 07/16/17 0419  NA 137   < > 140 139 142  --  139 138 138  K 3.6   < > 3.6 3.8 3.7  --  3.6 3.4* 4.4  CL 106   < > 112* 111 110  --  108 108 110  CO2 14*   < > 21* 19* 20*  --  24 23 23   GLUCOSE 336*   < > 54* 100* 182* 498* 314* 249* 231*  BUN 25*   < > 19 8 14   --  17 17 12   CREATININE 1.28*   < > 0.85 0.90 0.96  --  1.03* 0.92 0.95  CALCIUM 8.4*   < > 7.8* 7.6* 7.7*  --  7.8* 7.3* 7.5*  MG 1.3*  --  2.1  --   --   --   --   --  1.5*   < > = values in this interval not displayed.   GFR: Estimated Creatinine Clearance: 39 mL/min (by C-G formula based on SCr of 0.95 mg/dL). Liver Function Tests: Recent Labs  Lab 07/10/17 1223 07/11/17 1950 07/14/17 0619 07/15/17 0459 07/16/17 0419  AST 517* 631* 235* 742* 398*  ALT 267* 255* 145* 267* 244*  ALKPHOS 594* 752* 512* 485* 556*  BILITOT 1.9* 0.8  1.1 1.2 1.1  PROT 6.5 5.9* 4.7* 4.3* 4.3*  ALBUMIN 2.4* 2.2* 2.0* 1.8* 1.8*   No results for input(s): LIPASE, AMYLASE in the last 168 hours. No results for input(s): AMMONIA in the last 168 hours. Coagulation Profile: Recent Labs  Lab 07/11/17 1950  INR 1.11   Cardiac Enzymes: No results for input(s): CKTOTAL, CKMB, CKMBINDEX, TROPONINI in the last 168 hours. BNP (last 3 results) No results for input(s): PROBNP in the last 8760 hours. HbA1C: No results for input(s): HGBA1C in the last 72 hours. CBG: Recent Labs  Lab 07/15/17 0655 07/15/17 1151 07/15/17 1629 07/15/17 2131 07/16/17 0605  GLUCAP 147* 117* 153* 124* 167*   Lipid Profile: No results for input(s): CHOL, HDL, LDLCALC, TRIG, CHOLHDL, LDLDIRECT in the last 72 hours. Thyroid Function Tests: No results for input(s): TSH, T4TOTAL, FREET4, T3FREE, THYROIDAB in the last 72 hours. Anemia Panel: No results for input(s): VITAMINB12, FOLATE, FERRITIN, TIBC, IRON, RETICCTPCT  in the last 72 hours. Sepsis Labs: Recent Labs  Lab 07/10/17 1848  LATICACIDVEN 1.4    Recent Results (from the past 240 hour(s))  Culture, blood (routine x 2)     Status: None   Collection Time: 07/06/17  6:40 PM  Result Value Ref Range Status   Specimen Description   Final    BLOOD RIGHT ANTECUBITAL Performed at [facility], [address] [place], [place] [address]    Special Requests   Final    BOTTLES DRAWN AEROBIC AND ANAEROBIC Blood Culture adequate volume Performed at [facility], [address] [place], [place] [address]    Culture   Final    NO GROWTH 5 DAYS Performed at [facility], [place] [facility] [place] [address], [place], [place] [address]    Report Status 07/11/2017 FINAL  Final  MRSA PCR Screening     Status: None   Collection Time: 07/06/17  8:48 PM  Result Value Ref Range Status   MRSA by PCR NEGATIVE NEGATIVE Final    Comment:        The GeneXpert MRSA Assay (FDA approved for NASAL specimens only),  is one component of a comprehensive MRSA colonization surveillance program. It is not intended to diagnose MRSA infection nor to guide or monitor treatment for MRSA infections. Performed at [facility], [place] [address] [place], [place] [address]   Aerobic/Anaerobic Culture (surgical/deep wound)     Status: None (Preliminary result)   Collection Time: 07/12/17  4:35 PM  Result Value Ref Range Status   Specimen Description PERICARDIAL  Final   Special Requests NONE  Final   Gram Stain   Final    RARE WBC PRESENT, PREDOMINANTLY MONONUCLEAR NO ORGANISMS SEEN    Culture   Final    NO GROWTH 4 DAYS NO ANAEROBES ISOLATED; CULTURE IN PROGRESS FOR 5 DAYS Performed at [facility], [place] [address] [place], [place] [address]    Report Status PENDING  Incomplete  MRSA PCR Screening     Status: None   Collection Time: 07/12/17  8:49 PM  Result Value Ref Range Status   MRSA by PCR NEGATIVE NEGATIVE Final    Comment:        The GeneXpert MRSA Assay (FDA approved for NASAL specimens only), is one component of a comprehensive MRSA colonization surveillance program. It is not intended to diagnose MRSA infection nor to guide or monitor treatment for MRSA infections. Performed at [facility], [place] [address], [place], [place] [address]          Radiology Studies: Dg Chest 2 View  Result Date: 07/16/2017 CLINICAL DATA:  Pericardial drain. EXAM: CHEST - 2 VIEW COMPARISON:  07/14/2017. FINDINGS: 0710 hours. Pericardial drain has been removed in the interval. Right IJ central line has been removed. Lungs are hyperexpanded with underlying chronic interstitial coarsening. Left base atelectasis associated with small bilateral pleural effusions, left greater than right. The cardiopericardial silhouette is within normal limits for size. The visualized bony structures of the thorax are intact. Telemetry leads overlie the chest. IMPRESSION: 1. Interval removal of  pericardial drain and right IJ central line. 2. Left base atelectasis with bilateral pleural effusions. 3. Hyperexpansion with underlying chronic interstitial coarsening. Electronically Signed   By: Misty [place] M.D.   On: 07/16/2017 09:19        Scheduled Meds: . acetaminophen  650 mg Oral Q6H   Or  . acetaminophen (TYLENOL) oral  liquid 160 mg/5 mL  650 mg Oral Q6H  . aspirin EC  81 mg Oral Daily  . bisacodyl  10 mg Oral Daily  . feeding supplement (GLUCERNA 1.2 CAL)  237 mL Oral Q2000  . feeding supplement (PRO-STAT SUGAR FREE 64)  30 mL Oral BID  . ferrous sulfate  325 mg Oral Q1200  . guaiFENesin  1,200 mg Oral BID  . insulin aspart  0-15 Units Subcutaneous TID WC  . insulin aspart  0-5 Units Subcutaneous QHS  . insulin glargine  10 Units Subcutaneous QHS  . levothyroxine  100 mcg Oral QAC breakfast  . mouth rinse  15 mL Mouth Rinse BID  . megestrol  800 mg Oral Daily  . multivitamin with minerals  1 tablet Oral Q1200  . potassium chloride  40 mEq Oral BID  . senna-docusate  1 tablet Oral QHS   Continuous Infusions:   LOS: 10 days    I have spent 35 minutes face to face with the patient and on the ward discussing the patients care, assessment, plan and disposition with other care givers. >50% of the time was devoted counseling the patient about the risks and benefits of treatment and coordinating care.     Ankit Arsenio Loader, MD [facility] Pager [phone]   If 7PM-7AM, please contact night-coverage [email or web address] Password [facility] 07/16/2017, 11:41 AM

## 2017-07-16 NOTE — NC FL2 (Signed)
[place] LEVEL OF CARE SCREENING TOOL     IDENTIFICATION  Patient Name: Brittany Mercado Birthdate: 1950/11/08 Sex: female Admission Date (Current Location): 07/06/2017  [facility] and [place] Number:  Herbalist and Address:  The . [facility], [place] [address], [place], [place] [address]      Provider Number: [id number]  Attending Physician Name and Address:  Damita Lack, MD  Relative Name and Phone Number:       Current Level of Care: Hospital Recommended Level of Care: [place] Prior Approval Number:    Date Approved/Denied:   PASRR Number: [id number]  Discharge Plan: SNF    Current Diagnoses: Patient Active Problem List   Diagnosis Date Noted  . Pericardial effusion 07/12/2017  . Hyperkalemia 07/07/2017  . Anemia 07/07/2017  . Renal insufficiency 07/07/2017  . Shortness of breath   . DKA, type 2 ([place]) 05/24/2017  . Toe ulcer ([place]) 05/24/2017  . GERD (gastroesophageal reflux disease) 05/23/2017  . Hypothyroidism 05/23/2017  . Pressure injury of skin 05/09/2017  . Hypokalemia 05/09/2017  . SIRS (systemic inflammatory response syndrome) ([place]) 05/09/2017  . Lactic acidosis 05/09/2017  . DKA (diabetic ketoacidoses) ([place]) 05/08/2017  . AKI (acute kidney injury) ([place]) 05/08/2017  . Adult failure to thrive 04/29/2017  . Severe protein-calorie malnutrition ([place]) 04/10/2017  . Acute encephalopathy 04/09/2017  . Community acquired pneumonia 04/09/2017  . Encephalopathy acute 04/08/2017  . Diabetes mellitus type 2 in nonobese ([place]) 04/01/2017  . Hyperglycemia 03/25/2017  . Chest wall pain 03/21/2017  . 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 Chronic fatigue 12/24/2016  . Iron deficiency anemia due to chronic blood loss 12/10/2016  . HTN (hypertension) 02/01/2016  . Encounter for antineoplastic immunotherapy 12/21/2015  . Adenocarcinoma of right lung, stage 4 ([place]) 11/30/2015  . Encounter for antineoplastic chemotherapy  11/30/2015  . Tobacco abuse 11/10/2015  . Lung mass 10/11/2015    Orientation RESPIRATION BLADDER Height & Weight     Self, Time, Situation, Place  O2(2L [place]) Continent Weight: 93 lb 7.6 oz (42.4 kg) Height:  5\' 3"  (160 cm)  BEHAVIORAL SYMPTOMS/MOOD NEUROLOGICAL BOWEL NUTRITION STATUS      Continent Diet(carb modified)  AMBULATORY STATUS COMMUNICATION OF NEEDS Skin   Extensive Assist Verbally Normal                       Personal Care Assistance Level of Assistance  Bathing, Dressing Bathing Assistance: Maximum assistance   Dressing Assistance: Maximum assistance     Functional Limitations Info  Sight, Hearing, Speech Sight Info: Adequate Hearing Info: Adequate Speech Info: Adequate    SPECIAL CARE FACTORS FREQUENCY  PT (By licensed PT), OT (By licensed OT)     PT Frequency: 5/wk OT Frequency: 5/wk            Contractures      Additional Factors Info  Code Status, Allergies, Insulin Sliding Scale Code Status Info: FULL Allergies Info: Penicillins   Insulin Sliding Scale Info: 5/day       Current Medications (07/16/2017):  This is the current hospital active medication list Current Facility-Administered Medications  Medication Dose Route Frequency Provider Last Rate Last Dose  . acetaminophen (TYLENOL) tablet 650 mg  650 mg Oral Q6H Cherene Altes, MD   650 mg at 07/15/17 1706   Or  . acetaminophen (TYLENOL) solution 650 mg  650 mg Oral Q6H Cherene Altes, MD   650 mg at 07/16/17 1226  . aspirin  EC tablet 81 mg  81 mg Oral Daily Elgie Collard, PA-C   81 mg at 07/16/17 1227  . bisacodyl (DULCOLAX) EC tablet 10 mg  10 mg Oral Daily Nicholes Rough N, PA-C   10 mg at 07/15/17 1045  . feeding supplement (GLUCERNA 1.2 CAL) liquid 237 mL  237 mL Oral Q2000 Blount, Xenia T, NP   237 mL at 07/15/17 2015  . feeding supplement (PRO-STAT SUGAR FREE 64) liquid 30 mL  30 mL Oral BID Harriet Pho, Tessa N, PA-C   30 mL at 07/15/17 2250  . ferrous sulfate tablet 325 mg   325 mg Oral Q1200 Elgie Collard, PA-C   325 mg at 07/16/17 1227  . guaiFENesin (MUCINEX) 12 hr tablet 1,200 mg  1,200 mg Oral BID Nicholes Rough N, PA-C   1,200 mg at 07/16/17 1227  . insulin aspart (novoLOG) injection 0-15 Units  0-15 Units Subcutaneous TID WC Cherene Altes, MD   3 Units at 07/16/17 0802  . insulin aspart (novoLOG) injection 0-5 Units  0-5 Units Subcutaneous QHS Joette Catching T, MD      . insulin glargine (LANTUS) injection 10 Units  10 Units Subcutaneous QHS Cherene Altes, MD   10 Units at 07/15/17 2250  . lactulose (CHRONULAC) 10 GM/15ML solution 10 g  10 g Oral Daily PRN Grace Isaac, MD   10 g at 07/14/17 2230  . levothyroxine (SYNTHROID, LEVOTHROID) tablet 100 mcg  100 mcg Oral QAC breakfast Elgie Collard, PA-C   100 mcg at 07/16/17 5784  . lip balm (BLISTEX) ointment   Topical PRN Cherene Altes, MD      . MEDLINE mouth rinse  15 mL Mouth Rinse BID Cherene Altes, MD   15 mL at 07/15/17 0030  . megestrol (MEGACE) 400 MG/10ML suspension 800 mg  800 mg Oral Daily Nicholes Rough N, PA-C   800 mg at 07/16/17 1227  . multivitamin with minerals tablet 1 tablet  1 tablet Oral Q1200 Elgie Collard, PA-C   1 tablet at 07/15/17 1242  . ondansetron (ZOFRAN) injection 4 mg  4 mg Intravenous Q6H PRN Elgie Collard, PA-C   4 mg at 07/14/17 2142  . oxyCODONE (Oxy IR/ROXICODONE) immediate release tablet 5 mg  5 mg Oral Q4H PRN Harriet Pho, Tessa N, PA-C   5 mg at 07/14/17 1446  . senna-docusate (Senokot-S) tablet 1 tablet  1 tablet Oral QHS Elgie Collard, PA-C   1 tablet at 07/15/17 2250  . traMADol (ULTRAM) tablet 50-100 mg  50-100 mg Oral Q6H PRN Elgie Collard, PA-C         Discharge Medications: Please see discharge summary for a list of discharge medications.  Relevant Imaging Results:  Relevant Lab Results:   Additional Information SS#: [id number]  Wende Neighbors, LCSW

## 2017-07-17 LAB — GLUCOSE, CAPILLARY
GLUCOSE-CAPILLARY: 210 mg/dL — AB (ref 65–99)
GLUCOSE-CAPILLARY: 271 mg/dL — AB (ref 65–99)
Glucose-Capillary: 138 mg/dL — ABNORMAL HIGH (ref 65–99)
Glucose-Capillary: 215 mg/dL — ABNORMAL HIGH (ref 65–99)

## 2017-07-17 LAB — AEROBIC/ANAEROBIC CULTURE W GRAM STAIN (SURGICAL/DEEP WOUND): Culture: NO GROWTH

## 2017-07-17 LAB — CBC
HEMATOCRIT: 27.4 % — AB (ref 36.0–46.0)
Hemoglobin: 9.8 g/dL — ABNORMAL LOW (ref 12.0–15.0)
MCH: 28.5 pg (ref 26.0–34.0)
MCHC: 35.8 g/dL (ref 30.0–36.0)
MCV: 79.7 fL (ref 78.0–100.0)
PLATELETS: 343 10*3/uL (ref 150–400)
RBC: 3.44 MIL/uL — AB (ref 3.87–5.11)
RDW: 17.6 % — AB (ref 11.5–15.5)
WBC: 14.5 10*3/uL — AB (ref 4.0–10.5)

## 2017-07-17 LAB — COMPREHENSIVE METABOLIC PANEL
ALT: 206 U/L — AB (ref 14–54)
ANION GAP: 5 (ref 5–15)
AST: 290 U/L — ABNORMAL HIGH (ref 15–41)
Albumin: 1.9 g/dL — ABNORMAL LOW (ref 3.5–5.0)
Alkaline Phosphatase: 774 U/L — ABNORMAL HIGH (ref 38–126)
BUN: 12 mg/dL (ref 6–20)
CALCIUM: 7.8 mg/dL — AB (ref 8.9–10.3)
CHLORIDE: 110 mmol/L (ref 101–111)
CO2: 24 mmol/L (ref 22–32)
CREATININE: 0.99 mg/dL (ref 0.44–1.00)
GFR, EST NON AFRICAN AMERICAN: 58 mL/min — AB (ref >60)
Glucose, Bld: 202 mg/dL — ABNORMAL HIGH (ref 65–99)
Potassium: 5.3 mmol/L — ABNORMAL HIGH (ref 3.5–5.1)
SODIUM: 139 mmol/L (ref 135–145)
Total Bilirubin: 1.5 mg/dL — ABNORMAL HIGH (ref 0.3–1.2)
Total Protein: 4.7 g/dL — ABNORMAL LOW (ref 6.5–8.1)

## 2017-07-17 LAB — HEPATITIS PANEL, ACUTE
HEP A IGM: NEGATIVE
HEP B C IGM: NEGATIVE
HEP B S AG: NEGATIVE

## 2017-07-17 LAB — MAGNESIUM: MAGNESIUM: 1.7 mg/dL (ref 1.7–2.4)

## 2017-07-17 LAB — POTASSIUM: POTASSIUM: 5.4 mmol/L — AB (ref 3.5–5.1)

## 2017-07-17 NOTE — Care Management Note (Signed)
Case Management Note Marvetta Gibbons RN, BSN Unit 4E-Case Manager [phone]  Patient Details  Name: Brittany Mercado MRN: [id number] Date of Birth: [birth year]  Subjective/Objective:   Pt admitted with DKA, pericardial effusion, hx of lung cancer- s/p pericardial window                 Action/Plan: PTA pt lived at home, per PT eval recommendation for SNF, CSW consulted for placement needs.   Expected Discharge Date:                  Expected Discharge Plan:  Skilled Nursing Facility  In-House Referral:  Clinical Social Work  Discharge planning Services  CM Consult  Post Acute Care Choice:    Choice offered to:     DME Arranged:    DME Agency:     HH Arranged:    [place] Agency:     Status of Service:  In process, will continue to follow  If discussed at Long Length of Stay Meetings, dates discussed:    Discharge Disposition: skilled facility   Additional Comments:  Dawayne Patricia, RN 07/17/2017, 4:37 PM

## 2017-07-17 NOTE — Progress Notes (Signed)
PROGRESS NOTE    Brittany Mercado  SAY:[id number] DOB: 01-Aug-1950 DOA: 07/06/2017 PCP: [name], DO    Brief Narrative: 67 year old female with history of stage IV non-small cell lung adenocarcinoma, poorly controlled diabetes mellitus type 2, essential hypertension, hypothyroidism came to the hospital on 5/11 for evaluation of nausea.  She was found to have diabetic ketoacidosis.  She suffered from DKA again and protocol and to be reinitiated on 5/15.  Hospital course was complicated by postobstructive pneumonia and was found to have a large pericardial effusion.  Pericardial window was placed on 5/17.   Assessment & Plan:   Active Problems:   Diabetes mellitus type 2 in nonobese (HCC)   DKA (diabetic ketoacidoses) (HCC)   Hypokalemia   Anemia   Renal insufficiency   Shortness of breath   Pericardial effusion  Acute respiratory failure with hypoxia:  Resolved.  Possibly from post obstructive pneumonia and moderate pericardial effusion vs stage 4 non small cell lung adenocarcinoma.  Completed the course of antibiotics.  Nebs as needed.  [place] oxygen as needed to keep sats greater than 905.      Moderate pericardial effusion: s/p pericardial window on 5/17.  Cardiothoracic surgery on board and following.  Drain removed on 5/20.  Pericardial biopsy is negative for malignancy.    Uncontrolled DM type insulin dependent. With hyperglycemia CBG (last 3)  Recent Labs    07/16/17 1851 07/16/17 2131 07/17/17 0603  GLUCAP 191* 261* 138*   Resume lantus and SSI.     Iron deficiency anemia:  Stable hemoglobin.    Severe protein calorie malnutrition Nutrition consulted.  On megace.    Transaminitis: Improving. Continue to monitor.    PAD:  On plavix, resume on discharge. .    Hypothyroidism: Resume synthroid.  Repeat thyroid panel lin 4 weeks.      DVT prophylaxis: scd's Code Status: full code.  Family Communication: none at bedside.  Disposition  Plan:SNF in am, wean her off oxygen.    Consultants:   Cardiothoracic surgery   Procedures: Pericardial window    Antimicrobials: completed levaquin.    Subjective: No new complaints.   Objective: Vitals:   07/16/17 1500 07/16/17 1621 07/16/17 1918 07/17/17 0500  BP:  (!) 105/54 (!) 107/51 (!) 95/57  Pulse:  68 100 93  Resp: (!) 24 (!) 28 (!) 25 (!) 24  Temp:  97.9 F (36.6 C) 98.1 F (36.7 C)   TempSrc:  Oral Oral   SpO2:  94% 100% 100%  Weight:      Height:        Intake/Output Summary (Last 24 hours) at 07/17/2017 1253 Last data filed at 07/16/2017 1630 Gross per 24 hour  Intake 140 ml  Output -  Net 140 ml   Filed Weights   07/11/17 1758 07/12/17 1404 07/13/17 0600  Weight: 38 kg (83 lb 12.4 oz) 37.6 kg (83 lb) 42.4 kg (93 lb 7.6 oz)    Examination:  General exam: Appears calm and comfortable on 3l it of [place] oxygen.  Respiratory system: Clear to auscultation. Respiratory effort normal. Cardiovascular system: S1 & S2 heard, RRR. No JVD, murmurs. No pedal edema. Gastrointestinal system: Abdomen is nondistended, soft and nontender. No organomegaly or masses felt. Normal bowel sounds heard. Central nervous system: Alert and oriented. Non focal . Extremities: Symmetric 5 x 5 power. Skin: No rashes, lesions or ulcers Psychiatry:  Mood & affect appropriate.     Data Reviewed: I have personally reviewed following labs and imaging studies  CBC: Recent Labs  Lab 07/13/17 0505 07/14/17 0619 07/15/17 0459 07/16/17 0419 07/17/17 0404  WBC 9.9 10.7* 8.6 11.4* 14.5*  HGB 9.1* 8.4* 8.5* 9.4* 9.8*  HCT 25.4* 23.8* 24.3* 26.9* 27.4*  MCV 79.6 79.1 80.2 80.8 79.7  PLT 357 341 300 340 503   Basic Metabolic Panel: Recent Labs  Lab 07/11/17 0506  07/13/17 0505 07/13/17 2152 07/14/17 0619 07/15/17 0459 07/16/17 0419 07/17/17 0404  NA 140   < > 142  --  139 138 138 139  K 3.6   < > 3.7  --  3.6 3.4* 4.4 5.3*  CL 112*   < > 110  --  108 108 110 110  CO2  21*   < > 20*  --  24 23 23 24   GLUCOSE 54*   < > 182* 498* 314* 249* 231* 202*  BUN 19   < > 14  --  17 17 12 12   CREATININE 0.85   < > 0.96  --  1.03* 0.92 0.95 0.99  CALCIUM 7.8*   < > 7.7*  --  7.8* 7.3* 7.5* 7.8*  MG 2.1  --   --   --   --   --  1.5* 1.7   < > = values in this interval not displayed.   GFR: Estimated Creatinine Clearance: 37.4 mL/min (by C-G formula based on SCr of 0.99 mg/dL). Liver Function Tests: Recent Labs  Lab 07/11/17 1950 07/14/17 0619 07/15/17 0459 07/16/17 0419 07/17/17 0404  AST 631* 235* 742* 398* 290*  ALT 255* 145* 267* 244* 206*  ALKPHOS 752* 512* 485* 556* 774*  BILITOT 0.8 1.1 1.2 1.1 1.5*  PROT 5.9* 4.7* 4.3* 4.3* 4.7*  ALBUMIN 2.2* 2.0* 1.8* 1.8* 1.9*   No results for input(s): LIPASE, AMYLASE in the last 168 hours. No results for input(s): AMMONIA in the last 168 hours. Coagulation Profile: Recent Labs  Lab 07/11/17 1950  INR 1.11   Cardiac Enzymes: No results for input(s): CKTOTAL, CKMB, CKMBINDEX, TROPONINI in the last 168 hours. BNP (last 3 results) No results for input(s): PROBNP in the last 8760 hours. HbA1C: No results for input(s): HGBA1C in the last 72 hours. CBG: Recent Labs  Lab 07/16/17 1223 07/16/17 1619 07/16/17 1851 07/16/17 2131 07/17/17 0603  GLUCAP 67 207* 191* 261* 138*   Lipid Profile: No results for input(s): CHOL, HDL, LDLCALC, TRIG, CHOLHDL, LDLDIRECT in the last 72 hours. Thyroid Function Tests: No results for input(s): TSH, T4TOTAL, FREET4, T3FREE, THYROIDAB in the last 72 hours. Anemia Panel: No results for input(s): VITAMINB12, FOLATE, FERRITIN, TIBC, IRON, RETICCTPCT in the last 72 hours. Sepsis Labs: Recent Labs  Lab 07/10/17 1848  LATICACIDVEN 1.4    Recent Results (from the past 240 hour(s))  Aerobic/Anaerobic Culture (surgical/deep wound)     Status: None (Preliminary result)   Collection Time: 07/12/17  4:35 PM  Result Value Ref Range Status   Specimen Description PERICARDIAL   Final   Special Requests NONE  Final   Gram Stain   Final    RARE WBC PRESENT, PREDOMINANTLY MONONUCLEAR NO ORGANISMS SEEN    Culture   Final    NO GROWTH 4 DAYS NO ANAEROBES ISOLATED; CULTURE IN PROGRESS FOR 5 DAYS Performed at [facility], [address], [place], [place] [address]    Report Status PENDING  Incomplete  MRSA PCR Screening     Status: None   Collection Time: 07/12/17  8:49 PM  Result Value Ref Range Status  MRSA by PCR NEGATIVE NEGATIVE Final    Comment:        The GeneXpert MRSA Assay (FDA approved for NASAL specimens only), is one component of a comprehensive MRSA colonization surveillance program. It is not intended to diagnose MRSA infection nor to guide or monitor treatment for MRSA infections. Performed at [facility], [place] [address], [place], [place] [address]          Radiology Studies: Dg Chest 2 View  Result Date: 07/16/2017 CLINICAL DATA:  Pericardial drain. EXAM: CHEST - 2 VIEW COMPARISON:  07/14/2017. FINDINGS: 0710 hours. Pericardial drain has been removed in the interval. Right IJ central line has been removed. Lungs are hyperexpanded with underlying chronic interstitial coarsening. Left base atelectasis associated with small bilateral pleural effusions, left greater than right. The cardiopericardial silhouette is within normal limits for size. The visualized bony structures of the thorax are intact. Telemetry leads overlie the chest. IMPRESSION: 1. Interval removal of pericardial drain and right IJ central line. 2. Left base atelectasis with bilateral pleural effusions. 3. Hyperexpansion with underlying chronic interstitial coarsening. Electronically Signed   By: Misty Stanley M.D.   On: 07/16/2017 09:19        Scheduled Meds: . acetaminophen  650 mg Oral Q6H   Or  . acetaminophen (TYLENOL) oral liquid 160 mg/5 mL  650 mg Oral Q6H  . aspirin EC  81 mg Oral Daily  . bisacodyl  10 mg Oral Daily  . feeding supplement  (GLUCERNA 1.2 CAL)  237 mL Oral Q2000  . feeding supplement (PRO-STAT SUGAR FREE 64)  30 mL Oral BID  . ferrous sulfate  325 mg Oral Q1200  . guaiFENesin  1,200 mg Oral BID  . insulin aspart  0-15 Units Subcutaneous TID WC  . insulin aspart  0-5 Units Subcutaneous QHS  . insulin glargine  10 Units Subcutaneous QHS  . levothyroxine  100 mcg Oral QAC breakfast  . mouth rinse  15 mL Mouth Rinse BID  . megestrol  800 mg Oral Daily  . multivitamin with minerals  1 tablet Oral Q1200  . polyethylene glycol  17 g Oral Daily  . senna-docusate  1 tablet Oral QHS   Continuous Infusions:   LOS: 11 days    Time spent: 30 minutes    Hosie Poisson, MD [facility] Pager [phone]  If 7PM-7AM, please contact night-coverage [email or web address] Password [facility] 07/17/2017, 12:53 PM

## 2017-07-17 NOTE — Progress Notes (Signed)
Clinical Social Worker following patient for support and discharge needs. Patient has bed at [facility] and [facility] informed facility to start authorization through patients insurance for pending discharge. Per MD patient should be ready tomorrow.   Rhea Pink, MSW,  [place]

## 2017-07-17 NOTE — Progress Notes (Addendum)
      [address]       [place] [address]             [phone]      5 Days Post-Op Procedure(s) (LRB): SUBXYPHOID PERICARDIAL WINDOW (N/A) Subjective: Very sleepy this morning. She did respond to some questions.   Objective: Vital signs in last 24 hours: Temp:  [97.9 F (36.6 C)-98.1 F (36.7 C)] 98.1 F (36.7 C) (05/21 1918) Pulse Rate:  [68-100] 93 (05/22 0500) Cardiac Rhythm: Normal sinus rhythm (05/22 0700) Resp:  [19-28] 24 (05/22 0500) BP: (95-107)/(51-57) 95/57 (05/22 0500) SpO2:  [94 %-100 %] 100 % (05/22 0500)     Intake/Output from previous day: 05/21 0701 - 05/22 0700 In: 620 [P.O.:620] Out: -  Intake/Output this shift: No intake/output data recorded.  General appearance: alert, cooperative and no distress Heart: regular rate and rhythm, S1, S2 normal, no murmur, click, rub or gallop Lungs: clear to auscultation bilaterally Abdomen: soft, non-tender; bowel sounds normal; no masses,  no organomegaly Extremities: extremities normal, atraumatic, no cyanosis or edema Wound: some clear yellow tinged drainage on her dressing.   Lab Results: Recent Labs    07/16/17 0419 07/17/17 0404  WBC 11.4* 14.5*  HGB 9.4* 9.8*  HCT 26.9* 27.4*  PLT 340 343   BMET:  Recent Labs    07/16/17 0419 07/17/17 0404  NA 138 139  K 4.4 5.3*  CL 110 110  CO2 23 24  GLUCOSE 231* 202*  BUN 12 12  CREATININE 0.95 0.99  CALCIUM 7.5* 7.8*    PT/INR: No results for input(s): LABPROT, INR in the last 72 hours. ABG    Component Value Date/Time   PHART 7.484 (H) 07/13/2017 0510   HCO3 23.4 07/13/2017 0510   TCO2 24 07/13/2017 0510   ACIDBASEDEF 11.0 (H) 07/12/2017 2241   O2SAT 98.0 07/13/2017 0510   CBG (last 3)  Recent Labs    07/16/17 1851 07/16/17 2131 07/17/17 0603  GLUCAP 191* 261* 138*    Assessment/Plan: S/P Procedure(s) (LRB): SUBXYPHOID PERICARDIAL WINDOW (N/A)  1. CV-BP low at times. NSR in the 90s. Hemodynamically  stable. 2. Creatinine 0.99, hyperkalemia, magnesium low normal 3. H and H increasing platelets 343k today 4. Severe malnutrition-on multiple supplements 5. Pericardial window incision is healing well. There was some clear, yellow-tinged drainage on her dressing which I changed this morning.   Plan: continue care per primary. Continue dressing changes as needed. Does not appear to be infected.    LOS: 11 days    Elgie Collard 07/17/2017  Pericardial fluid cytology with malignant cells Poor prognosis Will followup in office  patient examined and medical record reviewed,agree with above note. Tharon Aquas Trigt III 07/17/2017

## 2017-07-17 NOTE — Discharge Summary (Addendum)
Physician Discharge Summary  Brittany Mercado IOX:[id number] DOB: 1950/09/15 DOA: 07/06/2017  PCP: [name], DO  Admit date: 07/06/2017 Discharge date: 07/18/2017  Admitted From: home.  Disposition:  [facility].   Recommendations for Outpatient Follow-up:  1. Follow up with PCP in 1-2 weeks 2. Please obtain BMP/CBC in one week Please follow up with cardio thoracic surgery as recommended.  Please follow u pwith oncology Dr [name] in 2 weeks.  PLEASE repeat thyroid panel in 4 weeks.   Discharge Condition:guarded.  CODE STATUS: full code.  Diet recommendation: Heart Healthy   Brief/Interim Summary: 67 year old female with history of stage IV non-small cell lung adenocarcinoma, poorly controlled diabetes mellitus type 2, essential hypertension, hypothyroidism came to the hospital on 5/11 for evaluation of nausea. She was found to have diabetic ketoacidosis. She suffered from DKA again and protocol and to be reinitiated on 5/15. Hospital course was complicated by postobstructive pneumonia and was found to have a large pericardial effusion. Pericardial window was placed on 5/17 and the drain removed on 5/20.Marland Kitchen She is currently off oxygen and without any chest pain or sob.     Discharge Diagnoses:  Active Problems:   Diabetes mellitus type 2 in nonobese (HCC)   DKA (diabetic ketoacidoses) (HCC)   Hypokalemia   Anemia   Renal insufficiency   Shortness of breath   Pericardial effusion  Acute respiratory failure with hypoxia:  Resolved.  Possibly from post obstructive pneumonia and moderate pericardial effusion vs stage 4 non small cell lung adenocarcinoma.  Completed the course of antibiotics.  Nebs as needed.  [place] oxygen as needed to keep sats greater than 905.      Moderate pericardial effusion: s/p pericardial window on 5/17.  Cardiothoracic surgery on board and following.  Drain removed on 5/20.  Pericardial biopsy is negative for malignancy.     Uncontrolled DM type insulin dependent. With hyperglycemia CBG (last 3)  Recent Labs    07/17/17 2133 07/18/17 0643 07/18/17 0706  GLUCAP 271* 29* 146*   Holding oral hypoglycemic agents.  Continue with SSI and decrease lantus to 6 units at bedtime.the lantus is to be given only if she eats 80% of her dinner and also provide snacks at night so she does not become hypoglycemic early in the morning.  Recommend to check cbg's three times a day.       Iron deficiency anemia:  Stable hemoglobin.    Severe protein calorie malnutrition Nutrition consulted.  On megace.    Transaminitis: Improving. Continue to monitor. Repeat liver function panel in 2 to 4 weeks.    PAD:  On plavix, resume on discharge. .    Hypothyroidism: Resume synthroid.  Repeat thyroid panel lin 4 weeks.         Discharge Instructions  Discharge Instructions    Diet - low sodium heart healthy   Complete by:  As directed    Discharge instructions   Complete by:  As directed    Please follow up with cardio thoracic surgery as recommended.  Please follow up with oncology as recommended.     Allergies as of 07/18/2017      Reactions   Penicillins Swelling   SWELLING REACTION UNSPECIFIED  PATIENT HAS HAD A PCN REACTION WITH IMMEDIATE RASH, FACIAL/TONGUE/THROAT SWELLING, SOB, OR LIGHTHEADEDNESS WITH HYPOTENSION:  #  #  YES  #  #  Has patient had a PCN reaction causing severe rash involving mucus membranes or skin necrosis: No Has patient had a PCN reaction that  required hospitalization No Has patient had a PCN reaction occurring within the last 10 years: No If all of the above answers are "NO", then may proceed with Cephalosporin use.      Medication List    STOP taking these medications   glimepiride 2 MG tablet Commonly known as:  AMARYL   Insulin Glargine 100 UNIT/ML Solostar Pen Commonly known as:  LANTUS Replaced by:  insulin glargine 100 UNIT/ML injection    insulin lispro 100 UNIT/ML KiwkPen Commonly known as:  HUMALOG   meloxicam 7.5 MG tablet Commonly known as:  MOBIC   metFORMIN 500 MG tablet Commonly known as:  GLUCOPHAGE   protein supplement shake Liqd Commonly known as:  PREMIER PROTEIN     TAKE these medications   aspirin 81 MG EC tablet Take 1 tablet (81 mg total) by mouth daily.   blood glucose meter kit and supplies Kit Dispense based on patient and insurance preference. Use up to four times daily as directed. (FOR ICD-9 250.00, 250.01).   cetirizine 10 MG tablet Commonly known as:  ZYRTEC TAKE 1 TABLET(10 MG) BY MOUTH DAILY   clopidogrel 75 MG tablet Commonly known as:  PLAVIX Take 1 tablet (75 mg total) by mouth daily with breakfast.   diclofenac sodium 1 % Gel Commonly known as:  VOLTAREN Apply layer to chest 4 times daily as needed for pain.   famotidine 10 MG chewable tablet Commonly known as:  PEPCID AC Chew 10 mg by mouth daily as needed for heartburn.   feeding supplement (GLUCERNA 1.2 CAL) Liqd Take 237 mLs by mouth daily at 8 pm.   feeding supplement (PRO-STAT SUGAR FREE 64) Liqd Take 30 mLs by mouth 2 (two) times daily.   ferrous sulfate 325 (65 FE) MG tablet Take 325 mg by mouth daily.   insulin aspart 100 UNIT/ML injection Commonly known as:  novoLOG Inject 0-15 Units into the skin 3 (three) times daily with meals.   insulin glargine 100 UNIT/ML injection Commonly known as:  LANTUS Inject 0.06 mLs (6 Units total) into the skin at bedtime. Replaces:  Insulin Glargine 100 UNIT/ML Solostar Pen   Insulin Pen Needle 31G X 8 MM Misc Commonly known as:  B-D ULTRAFINE III SHORT PEN USE AS DIRECTED WITH INSULIN   lactulose 10 GM/15ML solution Commonly known as:  CHRONULAC Take 15 mLs (10 g total) by mouth daily as needed for mild constipation.   levothyroxine 100 MCG tablet Commonly known as:  SYNTHROID, LEVOTHROID TAKE 1 TABLET(100 MCG) BY MOUTH DAILY BEFORE BREAKFAST   megestrol 625  MG/5ML suspension Commonly known as:  MEGACE ES Take 5 mLs (625 mg total) by mouth daily.   multivitamin tablet Take 1 tablet by mouth daily.   oxyCODONE-acetaminophen 10-325 MG tablet Commonly known as:  PERCOCET Take 1 tablet by mouth every 12 (twelve) hours as needed for pain.   polyethylene glycol packet Commonly known as:  MIRALAX / GLYCOLAX Take 17 g by mouth daily.   senna-docusate 8.6-50 MG tablet Commonly known as:  Senokot-S Take 1 tablet by mouth at bedtime.      Contact information for after-discharge care    Destination    [facility] .   Service:  Skilled Nursing Contact information: [address] [place] [address] [phone]             Allergies  Allergen Reactions  . Penicillins Swelling    SWELLING REACTION UNSPECIFIED  PATIENT HAS HAD A PCN REACTION  WITH IMMEDIATE RASH, FACIAL/TONGUE/THROAT SWELLING, SOB, OR LIGHTHEADEDNESS WITH HYPOTENSION:  #  #  YES  #  #  Has patient had a PCN reaction causing severe rash involving mucus membranes or skin necrosis: No Has patient had a PCN reaction that required hospitalization No Has patient had a PCN reaction occurring within the last 10 years: No If all of the above answers are "NO", then may proceed with Cephalosporin use.    Consultations:  Cardiothoracic surgery.    Procedures/Studies: Dg Chest 2 View  Result Date: 07/16/2017 CLINICAL DATA:  Pericardial drain. EXAM: CHEST - 2 VIEW COMPARISON:  07/14/2017. FINDINGS: 0710 hours. Pericardial drain has been removed in the interval. Right IJ central line has been removed. Lungs are hyperexpanded with underlying chronic interstitial coarsening. Left base atelectasis associated with small bilateral pleural effusions, left greater than right. The cardiopericardial silhouette is within normal limits for size. The visualized bony structures of the thorax are intact. Telemetry leads overlie the chest. IMPRESSION:  1. Interval removal of pericardial drain and right IJ central line. 2. Left base atelectasis with bilateral pleural effusions. 3. Hyperexpansion with underlying chronic interstitial coarsening. Electronically Signed   By: Misty Stanley M.D.   On: 07/16/2017 09:19   Dg Chest 2 View  Result Date: 07/06/2017 CLINICAL DATA:  Severe chest pain, heart racing for while but worse this morning, awoke patient from sleep, RIGHT shoulder and chest pain, stage IV lung cancer on chemotherapy, tachypnea, hypertension, type II diabetes mellitus, former smoker EXAM: CHEST - 2 VIEW COMPARISON:  05/23/2017; correlation interval CT chest 06/10/2017 FINDINGS: Borderline enlargement of cardiac silhouette. Atherosclerotic calcification aorta. Mediastinal contours and pulmonary vascularity normal. Emphysematous changes consistent with COPD. No definite acute infiltrate, pleural effusion or pneumothorax. Bones demineralized. IMPRESSION: COPD changes without acute abnormality. Electronically Signed   By: Lavonia Dana M.D.   On: 07/06/2017 13:31   Ct Angio Chest Pe W Or Wo Contrast  Result Date: 07/08/2017 CLINICAL DATA:  Chest discomfort.  Possible pulmonary embolus. EXAM: CT ANGIOGRAPHY CHEST WITH CONTRAST TECHNIQUE: Multidetector CT imaging of the chest was performed using the standard protocol during bolus administration of intravenous contrast. Multiplanar CT image reconstructions and MIPs were obtained to evaluate the vascular anatomy. CONTRAST:  71m ISOVUE-370 IOPAMIDOL (ISOVUE-370) INJECTION 76% COMPARISON:  06/10/2017. FINDINGS: Cardiovascular: Negative for pulmonary embolus. Atherosclerotic calcification of the arterial vasculature, including coronary arteries. Pulmonary arteries and heart are enlarged. New moderate pericardial effusion. Mediastinum/Nodes: Low internal jugular lymph nodes on the left measure up to 8 mm. No pathologically enlarged mediastinal lymph nodes. Right infrahilar mass measures approximately 3.3 x 3.6  cm and markedly narrows the right superior pulmonary vein. There is encasement and mild narrowing of the right middle and right lower lobe pulmonary arteries. Findings are similar to 06/10/2017. No left hilar adenopathy. Subpectoral and axillary lymph nodes measure up to 7 mm on the left. Esophagus is grossly unremarkable. Lungs/Pleura: Severe centrilobular emphysema with bullous changes in the right apex. New collapse/consolidation in the right lower lobe obscures a previously measured nodule in the superior segment right lower lobe. Small right pleural effusion is new and is partially loculated in the upper right hemithorax. There is obstruction of the right lower lobe bronchus by the right infrahilar mass, which is new from 06/10/2017. Associated fluid-filled bronchi in the right lower lobe. Upper Abdomen: Visualized portion of the liver is unremarkable. Irregular right adrenal gland mass measures 2.4 x 3.8 cm, previously 2.1 x 3.4 cm. Visualized portions of the left adrenal gland  and kidneys are unremarkable. Previously measured ill-defined low-attenuation lesion in the spleen is not readily visualized, possibly due to differences in bolus timing. Visualized portions of the pancreas, stomach and bowel are grossly unremarkable. High aortocaval lymph node measures 10 mm, similar. Musculoskeletal: No worrisome lytic or sclerotic lesions. Degenerative changes in the spine. Review of the MIP images confirms the above findings. IMPRESSION: 1. Negative for pulmonary embolus. 2. Right infrahilar mass is grossly stable in size but with new obstruction of the right lower lobe bronchus and postobstructive pneumonitis/pneumonia in the right lower lobe. Previously measured nodule in the superior segment right lower lobe is now obscured. 3. Enlarging right adrenal metastasis. Previously seen splenic lesion is not well visualized today, possibly due to bolus timing. 4. New small right pleural effusion. 5. New moderate  pericardial effusion. 6. Enlarged pulmonary arteries, indicative of pulmonary arterial hypertension. 7.  Aortic atherosclerosis (ICD10-170.0). 8.  Emphysema (ICD10-J43.9). Electronically Signed   By: Lorin Picket M.D.   On: 07/08/2017 12:00   Nm Pulmonary Perf And Vent  Result Date: 07/07/2017 CLINICAL DATA:  Chest pain, shortness of breath, tachycardia, abnormal renal function, history lung cancer, hypertension, type II diabetes mellitus, former smoker EXAM: NUCLEAR MEDICINE VENTILATION - PERFUSION LUNG SCAN TECHNIQUE: Ventilation images were obtained in multiple projections using inhaled aerosol [id number]. Perfusion images were obtained in multiple projections after intravenous injection of Tc-938mAA. RADIOPHARMACEUTICALS:  28 mCi of Tc-9935mPA aerosol inhalation and 4 mCi Tc99m75m IV COMPARISON:  None Correlation: Chest radiograph 07/06/2017, CT chest 05/21/2017 FINDINGS: Ventilation: Central airway deposition of aerosol at RIGHT hilum. Absent ventilation to RIGHT lower lobe. Segmental/subsegmental diminished ventilation laterally in RIGHT upper lobe. No additional ventilatory defects. Perfusion: Present though diminished perfusion in the LEFT lower lobe, better than ventilation. Matching diminished perfusion laterally in RIGHT upper lobe. Nonsegmental irregular perfusion in lateral LEFT upper lobe. Chest radiograph: Upper normal heart size with question pulmonary vascular congestion, emphysematous changes, and RIGHT hilar/perihilar mass. IMPRESSION: Matching ventilation and perfusion defect at lateral RIGHT upper lobe corresponding to site of significant emphysematous changes by CT. Absent ventilation and diminished perfusion in the RIGHT lower lobe in a patient with a known RIGHT hilar/perihilar mass. No other segmental or subsegmental perfusion defects identified. Findings represent an intermediate probability for pulmonary embolism. Electronically Signed   By: MarkLavonia Dana.   On: 07/07/2017  12:48   Dg Chest Port 1 View  Result Date: 07/14/2017 CLINICAL DATA:  Chest tube in place. EXAM: PORTABLE CHEST 1 VIEW COMPARISON:  07/13/2017 and CT 07/08/2017 FINDINGS: Right IJ central venous catheter unchanged with tip over the SVC. Pericardial drain unchanged. Lungs are adequately inflated with stable subtle bibasilar opacification likely small amount of pleural fluid/atelectasis. No pneumothorax. Stable right hilar prominence with known mass in this region by CT. Cardiomediastinal silhouette and remainder of the exam is unchanged. IMPRESSION: Stable minimal bibasilar opacification likely small amount of bilateral pleural fluid/atelectasis. Stable right hilar prominence with known mass in this region by recent CT. Tubes and lines as described. Electronically Signed   By: DaniMarin Olp.   On: 07/14/2017 09:12   Dg Chest Port 1 View  Result Date: 07/13/2017 CLINICAL DATA:  Pericardial effusion. EXAM: PORTABLE CHEST 1 VIEW COMPARISON:  07/12/2017 FINDINGS: Right IJ central venous catheter with tip at the cavoatrial junction unchanged. Catheter projecting from below with tip over the left heart unchanged likely pericardial catheter. Lungs are adequately inflated with minimal bibasilar hazy opacification likely small amount of pleural fluid/atelectasis. Cardiomediastinal silhouette  and remainder the exam is unchanged. IMPRESSION: Stable mild hazy bibasilar opacification likely pleural fluid/atelectasis. Tubes and lines as described. Electronically Signed   By: Marin Olp M.D.   On: 07/13/2017 07:48   Dg Chest Port 1 View  Result Date: 07/12/2017 CLINICAL DATA:  Status post subxiphoid pericardial window. EXAM: PORTABLE CHEST 1 VIEW COMPARISON:  07/08/2017 CT, 07/06/2017 chest radiograph and prior studies FINDINGS: No significant change in cardiomediastinal silhouette. A RIGHT IJ central venous catheter is noted with tip overlying the SUPERIOR cavoatrial junction. A pericardial drain is now noted.  Mild bibasilar atelectasis noted with possible trace effusions. There is no evidence of pneumothorax. IMPRESSION: RIGHT IJ central venous catheter and pericardial drain. No pneumothorax. Mild bibasilar atelectasis with possible trace effusions. Electronically Signed   By: Margarette Canada M.D.   On: 07/12/2017 18:54   Dg Chest Portable 1 View  Result Date: 07/06/2017 CLINICAL DATA:  Acute shortness of breath. History of stage IV lung cancer. EXAM: PORTABLE CHEST 1 VIEW COMPARISON:  07/06/2017 chest radiograph, 06/10/2017 CT and prior studies FINDINGS: RIGHT hilar/perihilar mass again noted. UPPER limits normal heart size identified. Apparent increase in pulmonary vascular congestion noted. No pleural effusion or pneumothorax noted. No acute bony abnormalities are present. IMPRESSION: Question pulmonary vascular congestion. Otherwise unchanged appearance of the chest with RIGHT hilar/perihilar mass. Electronically Signed   By: Margarette Canada M.D.   On: 07/06/2017 19:14   US Abdomen Limited Ruq  Result Date: 07/11/2017 CLINICAL DATA:  Abnormal liver function tests. EXAM: ULTRASOUND ABDOMEN LIMITED RIGHT UPPER QUADRANT COMPARISON:  CT scan of May 21, 2017. FINDINGS: Gallbladder: No gallstones or wall thickening visualized. No sonographic Murphy sign noted by sonographer. Common bile duct: Diameter: 1 mm which is within normal limits. Liver: No focal lesion identified. Within normal limits in parenchymal echogenicity. Portal vein is patent on color Doppler imaging with normal direction of blood flow towards the liver. 4.9 x 3.4 x 1.9 cm hypoechoic mass is noted between right kidney and liver which corresponds to adrenal mass described on prior CT scan. IMPRESSION: 4.9 x 3.4 x 1.9 cm right adrenal mass is noted most consistent with metastatic disease. No other abnormality seen in the right upper quadrant of the abdomen. Electronically Signed   By: Marijo Conception, M.D.   On: 07/11/2017 08:56     Subjective: No  chest pain or sob.  No palpitations.   Discharge Exam: Vitals:   07/17/17 1356 07/17/17 2135  BP: (!) 111/49 134/69  Pulse: 98 (!) 48  Resp: 18 (!) 31  Temp: 99 F (37.2 C) 97.6 F (36.4 C)  SpO2: 100% 100%   Vitals:   07/17/17 0500 07/17/17 0951 07/17/17 1356 07/17/17 2135  BP: (!) 95/57 (!) 108/58 (!) 111/49 134/69  Pulse: 93  98 (!) 48  Resp: (!) 24 (!) 21 18 (!) 31  Temp:   99 F (37.2 C) 97.6 F (36.4 C)  TempSrc:   Oral Oral  SpO2: 100%  100% 100%  Weight:      Height:        General: Pt is alert, awake, not in acute distress Cardiovascular: RRR, S1/S2 +, no rubs, no gallops Respiratory: CTA bilaterally, no wheezing, no rhonchi Abdominal: Soft, NT, ND, bowel sounds + Extremities: no edema, no cyanosis    The results of significant diagnostics from this hospitalization (including imaging, microbiology, ancillary and laboratory) are listed below for reference.     Microbiology: Recent Results (from the past 240 hour(s))  Aerobic/Anaerobic  Culture (surgical/deep wound)     Status: None   Collection Time: 07/12/17  4:35 PM  Result Value Ref Range Status   Specimen Description PERICARDIAL  Final   Special Requests NONE  Final   Gram Stain   Final    RARE WBC PRESENT, PREDOMINANTLY MONONUCLEAR NO ORGANISMS SEEN    Culture   Final    No growth aerobically or anaerobically. Performed at [facility], [place] [address], [place], [place] [address]    Report Status 07/17/2017 FINAL  Final  MRSA PCR Screening     Status: None   Collection Time: 07/12/17  8:49 PM  Result Value Ref Range Status   MRSA by PCR NEGATIVE NEGATIVE Final    Comment:        The GeneXpert MRSA Assay (FDA approved for NASAL specimens only), is one component of a comprehensive MRSA colonization surveillance program. It is not intended to diagnose MRSA infection nor to guide or monitor treatment for MRSA infections. Performed at [facility], [place] [address]  [place], [place] [address]      Labs: BNP (last 3 results) No results for input(s): BNP in the last 8760 hours. Basic Metabolic Panel: Recent Labs  Lab 07/14/17 0619 07/15/17 0459 07/16/17 0419 07/17/17 0404 07/17/17 1756 07/18/17 0351  NA 139 138 138 139  --  137  K 3.6 3.4* 4.4 5.3* 5.4* 4.5  CL 108 108 110 110  --  110  CO2 _0 --  23  GLUCOSE 314* 249* 231* 202*  --  68  BUN _1 --  9  CREATININE 1.03* 0.92 0.95 0.99  --  0.90  CALCIUM 7.8* 7.3* 7.5* 7.8*  --  7.8*  MG  --   --  1.5* 1.7  --  1.6*   Liver Function Tests: Recent Labs  Lab 07/14/17 0619 07/15/17 0459 07/16/17 0419 07/17/17 0404 07/18/17 0351  AST 235* 742* 398* 290* 246*  ALT 145* 267* 244* 206* 177*  ALKPHOS 512* 485* 556* 774* 1,019*  BILITOT 1.1 1.2 1.1 1.5* 1.3*  PROT 4.7* 4.3* 4.3* 4.7* 4.5*  ALBUMIN 2.0* 1.8* 1.8* 1.9* 1.9*   No results for input(s): LIPASE, AMYLASE in the last 168 hours. No results for input(s): AMMONIA in the last 168 hours. CBC: Recent Labs  Lab 07/14/17 0619 07/15/17 0459 07/16/17 0419 07/17/17 0404 07/18/17 0351  WBC 10.7* 8.6 11.4* 14.5* 15.0*  HGB 8.4* 8.5* 9.4* 9.8* 8.7*  HCT 23.8* 24.3* 26.9* 27.4* 23.9*  MCV 79.1 80.2 80.8 79.7 79.1  PLT 341 300 340 343 341   Cardiac Enzymes: No results for input(s): CKTOTAL, CKMB, CKMBINDEX, TROPONINI in the last 168 hours. BNP: Invalid input(s): POCBNP CBG: Recent Labs  Lab 07/17/17 1301 07/17/17 1636 07/17/17 2133 07/18/17 0643 07/18/17 0706  GLUCAP 210* 215* 271* 29* 146*   D-Dimer No results for input(s): DDIMER in the last 72 hours. Hgb A1c No results for input(s): HGBA1C in the last 72 hours. Lipid Profile No results for input(s): CHOL, HDL, LDLCALC, TRIG, CHOLHDL, LDLDIRECT in the last 72 hours. Thyroid function studies No results for input(s): TSH, T4TOTAL, T3FREE, THYROIDAB in the last 72 hours.  Invalid input(s): FREET3 Anemia work up No results for input(s): VITAMINB12, FOLATE,  FERRITIN, TIBC, IRON, RETICCTPCT in the last 72 hours. Urinalysis    Component Value Date/Time   COLORURINE YELLOW 07/06/2017 [place] 07/06/2017 1246   LABSPEC 1.025 07/06/2017 1246  PHURINE 5.5 07/06/2017 1246   GLUCOSEU >=500 (A) 07/06/2017 1246   HGBUR TRACE (A) 07/06/2017 1246   BILIRUBINUR MODERATE (A) 07/06/2017 1246   KETONESUR >80 (A) 07/06/2017 1246   PROTEINUR NEGATIVE 07/06/2017 1246   NITRITE NEGATIVE 07/06/2017 1246   LEUKOCYTESUR NEGATIVE 07/06/2017 1246   Sepsis Labs Invalid input(s): PROCALCITONIN,  WBC,  LACTICIDVEN Microbiology Recent Results (from the past 240 hour(s))  Aerobic/Anaerobic Culture (surgical/deep wound)     Status: None   Collection Time: 07/12/17  4:35 PM  Result Value Ref Range Status   Specimen Description PERICARDIAL  Final   Special Requests NONE  Final   Gram Stain   Final    RARE WBC PRESENT, PREDOMINANTLY MONONUCLEAR NO ORGANISMS SEEN    Culture   Final    No growth aerobically or anaerobically. Performed at [facility], [place] [address] [place],  [address]    Report Status 07/17/2017 FINAL  Final  MRSA PCR Screening     Status: None   Collection Time: 07/12/17  8:49 PM  Result Value Ref Range Status   MRSA by PCR NEGATIVE NEGATIVE Final    Comment:        The GeneXpert MRSA Assay (FDA approved for NASAL specimens only), is one component of a comprehensive MRSA colonization surveillance program. It is not intended to diagnose MRSA infection nor to guide or monitor treatment for MRSA infections. Performed at [facility], [place] [address] [place],  [address]      Time coordinating discharge: 34 minutes  SIGNED:   Hosie Poisson, MD  [facility] 07/18/2017, 8:53 AM Pager   If 7PM-7AM, please contact night-coverage [email or web address] Password [email or web address]

## 2017-07-17 NOTE — Care Management Important Message (Signed)
Important Message  Patient Details  Name: Brittany Mercado MRN: [id number] Date of Birth: Nov 16, 1950   Medicare Important Message Given:  Yes    Arielle Eber P Heddy Vidana 07/17/2017, 3:23 PM

## 2017-07-18 DIAGNOSIS — Z923 Personal history of irradiation: Secondary | ICD-10-CM | POA: Diagnosis not present

## 2017-07-18 DIAGNOSIS — R1312 Dysphagia, oropharyngeal phase: Secondary | ICD-10-CM | POA: Diagnosis not present

## 2017-07-18 DIAGNOSIS — N179 Acute kidney failure, unspecified: Secondary | ICD-10-CM | POA: Diagnosis not present

## 2017-07-18 DIAGNOSIS — J910 Malignant pleural effusion: Secondary | ICD-10-CM | POA: Diagnosis not present

## 2017-07-18 DIAGNOSIS — E43 Unspecified severe protein-calorie malnutrition: Secondary | ICD-10-CM | POA: Diagnosis not present

## 2017-07-18 DIAGNOSIS — J439 Emphysema, unspecified: Secondary | ICD-10-CM | POA: Diagnosis not present

## 2017-07-18 DIAGNOSIS — E1010 Type 1 diabetes mellitus with ketoacidosis without coma: Secondary | ICD-10-CM | POA: Diagnosis not present

## 2017-07-18 DIAGNOSIS — N289 Disorder of kidney and ureter, unspecified: Secondary | ICD-10-CM | POA: Diagnosis not present

## 2017-07-18 DIAGNOSIS — C7989 Secondary malignant neoplasm of other specified sites: Secondary | ICD-10-CM | POA: Diagnosis not present

## 2017-07-18 DIAGNOSIS — R262 Difficulty in walking, not elsewhere classified: Secondary | ICD-10-CM | POA: Diagnosis not present

## 2017-07-18 DIAGNOSIS — G9341 Metabolic encephalopathy: Secondary | ICD-10-CM | POA: Diagnosis not present

## 2017-07-18 DIAGNOSIS — Z66 Do not resuscitate: Secondary | ICD-10-CM | POA: Diagnosis not present

## 2017-07-18 DIAGNOSIS — E46 Unspecified protein-calorie malnutrition: Secondary | ICD-10-CM | POA: Diagnosis not present

## 2017-07-18 DIAGNOSIS — Z4682 Encounter for fitting and adjustment of non-vascular catheter: Secondary | ICD-10-CM | POA: Diagnosis not present

## 2017-07-18 DIAGNOSIS — E0810 Diabetes mellitus due to underlying condition with ketoacidosis without coma: Secondary | ICD-10-CM | POA: Diagnosis not present

## 2017-07-18 DIAGNOSIS — I314 Cardiac tamponade: Secondary | ICD-10-CM | POA: Diagnosis not present

## 2017-07-18 DIAGNOSIS — J918 Pleural effusion in other conditions classified elsewhere: Secondary | ICD-10-CM | POA: Diagnosis not present

## 2017-07-18 DIAGNOSIS — E785 Hyperlipidemia, unspecified: Secondary | ICD-10-CM | POA: Diagnosis not present

## 2017-07-18 DIAGNOSIS — R0602 Shortness of breath: Secondary | ICD-10-CM | POA: Diagnosis not present

## 2017-07-18 DIAGNOSIS — R0682 Tachypnea, not elsewhere classified: Secondary | ICD-10-CM | POA: Diagnosis not present

## 2017-07-18 DIAGNOSIS — R945 Abnormal results of liver function studies: Secondary | ICD-10-CM | POA: Diagnosis not present

## 2017-07-18 DIAGNOSIS — E1311 Other specified diabetes mellitus with ketoacidosis with coma: Secondary | ICD-10-CM | POA: Diagnosis not present

## 2017-07-18 DIAGNOSIS — I340 Nonrheumatic mitral (valve) insufficiency: Secondary | ICD-10-CM | POA: Diagnosis not present

## 2017-07-18 DIAGNOSIS — Z7401 Bed confinement status: Secondary | ICD-10-CM | POA: Diagnosis not present

## 2017-07-18 DIAGNOSIS — M2550 Pain in unspecified joint: Secondary | ICD-10-CM | POA: Diagnosis not present

## 2017-07-18 DIAGNOSIS — Z794 Long term (current) use of insulin: Secondary | ICD-10-CM | POA: Diagnosis not present

## 2017-07-18 DIAGNOSIS — D508 Other iron deficiency anemias: Secondary | ICD-10-CM | POA: Diagnosis not present

## 2017-07-18 DIAGNOSIS — M6281 Muscle weakness (generalized): Secondary | ICD-10-CM | POA: Diagnosis not present

## 2017-07-18 DIAGNOSIS — E1011 Type 1 diabetes mellitus with ketoacidosis with coma: Secondary | ICD-10-CM | POA: Diagnosis not present

## 2017-07-18 DIAGNOSIS — K219 Gastro-esophageal reflux disease without esophagitis: Secondary | ICD-10-CM | POA: Diagnosis not present

## 2017-07-18 DIAGNOSIS — Z87891 Personal history of nicotine dependence: Secondary | ICD-10-CM | POA: Diagnosis not present

## 2017-07-18 DIAGNOSIS — C801 Malignant (primary) neoplasm, unspecified: Secondary | ICD-10-CM | POA: Diagnosis not present

## 2017-07-18 DIAGNOSIS — Z880 Allergy status to penicillin: Secondary | ICD-10-CM | POA: Diagnosis not present

## 2017-07-18 DIAGNOSIS — Z681 Body mass index (BMI) 19 or less, adult: Secondary | ICD-10-CM | POA: Diagnosis not present

## 2017-07-18 DIAGNOSIS — C3491 Malignant neoplasm of unspecified part of right bronchus or lung: Secondary | ICD-10-CM | POA: Diagnosis not present

## 2017-07-18 DIAGNOSIS — Z90710 Acquired absence of both cervix and uterus: Secondary | ICD-10-CM | POA: Diagnosis not present

## 2017-07-18 DIAGNOSIS — E1110 Type 2 diabetes mellitus with ketoacidosis without coma: Secondary | ICD-10-CM | POA: Diagnosis not present

## 2017-07-18 DIAGNOSIS — I351 Nonrheumatic aortic (valve) insufficiency: Secondary | ICD-10-CM | POA: Diagnosis not present

## 2017-07-18 DIAGNOSIS — R001 Bradycardia, unspecified: Secondary | ICD-10-CM | POA: Diagnosis not present

## 2017-07-18 DIAGNOSIS — I10 Essential (primary) hypertension: Secondary | ICD-10-CM | POA: Diagnosis not present

## 2017-07-18 DIAGNOSIS — D500 Iron deficiency anemia secondary to blood loss (chronic): Secondary | ICD-10-CM | POA: Diagnosis not present

## 2017-07-18 DIAGNOSIS — E875 Hyperkalemia: Secondary | ICD-10-CM | POA: Diagnosis not present

## 2017-07-18 DIAGNOSIS — R64 Cachexia: Secondary | ICD-10-CM | POA: Diagnosis not present

## 2017-07-18 DIAGNOSIS — D509 Iron deficiency anemia, unspecified: Secondary | ICD-10-CM | POA: Diagnosis not present

## 2017-07-18 DIAGNOSIS — R570 Cardiogenic shock: Secondary | ICD-10-CM | POA: Diagnosis not present

## 2017-07-18 DIAGNOSIS — J9602 Acute respiratory failure with hypercapnia: Secondary | ICD-10-CM | POA: Diagnosis not present

## 2017-07-18 DIAGNOSIS — I313 Pericardial effusion (noninflammatory): Secondary | ICD-10-CM | POA: Diagnosis not present

## 2017-07-18 DIAGNOSIS — E119 Type 2 diabetes mellitus without complications: Secondary | ICD-10-CM | POA: Diagnosis not present

## 2017-07-18 DIAGNOSIS — E1151 Type 2 diabetes mellitus with diabetic peripheral angiopathy without gangrene: Secondary | ICD-10-CM | POA: Diagnosis not present

## 2017-07-18 DIAGNOSIS — E039 Hypothyroidism, unspecified: Secondary | ICD-10-CM | POA: Diagnosis not present

## 2017-07-18 DIAGNOSIS — E860 Dehydration: Secondary | ICD-10-CM | POA: Diagnosis not present

## 2017-07-18 DIAGNOSIS — J449 Chronic obstructive pulmonary disease, unspecified: Secondary | ICD-10-CM | POA: Diagnosis not present

## 2017-07-18 DIAGNOSIS — J9601 Acute respiratory failure with hypoxia: Secondary | ICD-10-CM | POA: Diagnosis not present

## 2017-07-18 LAB — COMPREHENSIVE METABOLIC PANEL
ALK PHOS: 1019 U/L — AB (ref 38–126)
ALT: 177 U/L — ABNORMAL HIGH (ref 14–54)
ANION GAP: 4 — AB (ref 5–15)
AST: 246 U/L — ABNORMAL HIGH (ref 15–41)
Albumin: 1.9 g/dL — ABNORMAL LOW (ref 3.5–5.0)
BILIRUBIN TOTAL: 1.3 mg/dL — AB (ref 0.3–1.2)
BUN: 9 mg/dL (ref 6–20)
CALCIUM: 7.8 mg/dL — AB (ref 8.9–10.3)
CO2: 23 mmol/L (ref 22–32)
Chloride: 110 mmol/L (ref 101–111)
Creatinine, Ser: 0.9 mg/dL (ref 0.44–1.00)
GLUCOSE: 68 mg/dL (ref 65–99)
Potassium: 4.5 mmol/L (ref 3.5–5.1)
Sodium: 137 mmol/L (ref 135–145)
TOTAL PROTEIN: 4.5 g/dL — AB (ref 6.5–8.1)

## 2017-07-18 LAB — CBC
HCT: 23.9 % — ABNORMAL LOW (ref 36.0–46.0)
Hemoglobin: 8.7 g/dL — ABNORMAL LOW (ref 12.0–15.0)
MCH: 28.8 pg (ref 26.0–34.0)
MCHC: 36.4 g/dL — ABNORMAL HIGH (ref 30.0–36.0)
MCV: 79.1 fL (ref 78.0–100.0)
PLATELETS: 341 10*3/uL (ref 150–400)
RBC: 3.02 MIL/uL — AB (ref 3.87–5.11)
RDW: 17.5 % — ABNORMAL HIGH (ref 11.5–15.5)
WBC: 15 10*3/uL — AB (ref 4.0–10.5)

## 2017-07-18 LAB — GLUCOSE, CAPILLARY
Glucose-Capillary: 146 mg/dL — ABNORMAL HIGH (ref 65–99)
Glucose-Capillary: 29 mg/dL — CL (ref 65–99)
Glucose-Capillary: 167 mg/dL — ABNORMAL HIGH (ref 65–99)

## 2017-07-18 LAB — MAGNESIUM: Magnesium: 1.6 mg/dL — ABNORMAL LOW (ref 1.7–2.4)

## 2017-07-18 MED ORDER — SENNOSIDES-DOCUSATE SODIUM 8.6-50 MG PO TABS: 1.0000 | ORAL_TABLET | Freq: Every day | ORAL | Status: AC

## 2017-07-18 MED ORDER — INSULIN GLARGINE 100 UNIT/ML ~~LOC~~ SOLN: 6.0000 [IU] | Freq: Every day | SUBCUTANEOUS | Status: DC

## 2017-07-18 MED ORDER — INSULIN ASPART 100 UNIT/ML ~~LOC~~ SOLN: 0.0000 [IU] | Freq: Three times a day (TID) | SUBCUTANEOUS | 11 refills | Status: DC

## 2017-07-18 MED ORDER — GLUCERNA 1.2 CAL PO LIQD: 237.0000 mL | Freq: Every day | ORAL | Status: AC

## 2017-07-18 MED ORDER — DEXTROSE 50 % IV SOLN
INTRAVENOUS | Status: AC
  Administered 2017-07-18: 50 mL
  Filled 2017-07-18: qty 50

## 2017-07-18 MED ORDER — OXYCODONE-ACETAMINOPHEN 10-325 MG PO TABS: 1.0000 | ORAL_TABLET | Freq: Two times a day (BID) | ORAL | 0 refills | Status: AC | PRN

## 2017-07-18 MED ORDER — INSULIN GLARGINE 100 UNIT/ML ~~LOC~~ SOLN: 6.0000 [IU] | Freq: Every day | SUBCUTANEOUS | 11 refills | Status: AC

## 2017-07-18 MED ORDER — LACTULOSE 10 GM/15ML PO SOLN: 10.0000 g | Freq: Every day | ORAL | 0 refills | Status: AC | PRN

## 2017-07-18 MED ORDER — PRO-STAT SUGAR FREE PO LIQD: 30.0000 mL | Freq: Two times a day (BID) | ORAL | 0 refills | Status: AC

## 2017-07-18 MED ORDER — POLYETHYLENE GLYCOL 3350 17 G PO PACK: 17.0000 g | PACK | Freq: Every day | ORAL | 0 refills | Status: AC

## 2017-07-18 NOTE — Progress Notes (Signed)
      [address]       [place] [address]             [phone]      6 Days Post-Op Procedure(s) (LRB): SUBXYPHOID PERICARDIAL WINDOW (N/A) Subjective: She is having some back pain this morning.   Objective: Vital signs in last 24 hours: Temp:  [97.6 F (36.4 C)-99 F (37.2 C)] 97.6 F (36.4 C) (05/22 2135) Pulse Rate:  [48-98] 48 (05/22 2135) Cardiac Rhythm: Normal sinus rhythm (05/22 1900) Resp:  [18-31] 31 (05/22 2135) BP: (108-134)/(49-69) 134/69 (05/22 2135) SpO2:  [100 %] 100 % (05/22 2135)     Intake/Output from previous day: 05/22 0701 - 05/23 0700 In: 360 [P.O.:360] Out: -  Intake/Output this shift: No intake/output data recorded.  General appearance: alert, cooperative and no distress Heart: regular rate and rhythm, S1, S2 normal, no murmur, click, rub or gallop Lungs: clear to auscultation bilaterally Abdomen: soft, non-tender; bowel sounds normal; no masses,  no organomegaly Extremities: extremities normal, atraumatic, no cyanosis or edema Wound: clean and some small amount of drainage on her dressing.   Lab Results: Recent Labs    07/17/17 0404 07/18/17 0351  WBC 14.5* 15.0*  HGB 9.8* 8.7*  HCT 27.4* 23.9*  PLT 343 341   BMET:  Recent Labs    07/17/17 0404 07/17/17 1756 07/18/17 0351  NA 139  --  137  K 5.3* 5.4* 4.5  CL 110  --  110  CO2 24  --  23  GLUCOSE 202*  --  68  BUN 12  --  9  CREATININE 0.99  --  0.90  CALCIUM 7.8*  --  7.8*    PT/INR: No results for input(s): LABPROT, INR in the last 72 hours. ABG    Component Value Date/Time   PHART 7.484 (H) 07/13/2017 0510   HCO3 23.4 07/13/2017 0510   TCO2 24 07/13/2017 0510   ACIDBASEDEF 11.0 (H) 07/12/2017 2241   O2SAT 98.0 07/13/2017 0510   CBG (last 3)  Recent Labs    07/17/17 2133 07/18/17 0643 07/18/17 0706  GLUCAP 271* 29* 146*    Assessment/Plan: S/P Procedure(s) (LRB): SUBXYPHOID PERICARDIAL WINDOW (N/A)  1. CV-BP low at times. NSR in  the 80s, had some episodes of brady. Hemodynamically stable. 2. Creatinine 0.90, hyperkalemia, magnesium low normal 3. H and H increasing platelets 341k today 4. Severe malnutrition-on multiple supplements 5. Pericardial window incision is healing well. There was less drainage today.   Plan: Poor prognosis with malignant pleural effusion.    LOS: 12 days    Elgie Collard 07/18/2017

## 2017-07-18 NOTE — Consult Note (Signed)
   [facility] CM Inpatient Consult   07/18/2017  Brittany Mercado September 11, 1950 [id number]     [facility] Care Management follow up.  Chart reviewed. Noted [name] discharged [facility] SNF today.   Will request to be assigned [facility] LCSW for follow up while at SNF.   Marthenia Rolling, MSN-Ed, RN,BSN [facility] Liaison [phone]

## 2017-07-18 NOTE — Progress Notes (Signed)
Clinical Social Worker facilitated patient discharge including contacting patient family and facility to confirm patient discharge plans.  Clinical information faxed to facility and family agreeable with plan.  CSW arranged ambulance transport via [facility] to [place] .  RN to call [phone] (pt will go in room 115) for report prior to discharge.  Clinical Social Worker will sign off for now as social work intervention is no longer needed. Please consult [place] again if new need arises.  Rhea Pink, MSW, [place]

## 2017-07-18 NOTE — Progress Notes (Signed)
Hypoglycemic Event  CBG: 29  Treatment: Amp of D50  Symptoms: Sweating  Follow-up CBG: Time:0706 CBG Result:146  Possible Reasons for Event: Patient refused bedtime snack  Comments/MD notified:No    Brittany Mercado

## 2017-07-18 NOTE — Progress Notes (Signed)
Physical Therapy Treatment Patient Details Name: Brittany Mercado MRN: [id number] DOB: Jul 01, 1950 Today's Date: 07/18/2017    History of Present Illness 67 year old female w/ a hx of stage IV non-small cell lung cancer/adenocarcinoma currently on IV Ketruda/immunotherapy every 3 weeks (Dr. [name]), poorly controlled DM2 with frequent episodes of DKA, HTN, and hypothyroidism who presented to the ED 07/06/2017 due to nausea and was found to be in DKA.Her DKA initially resolved, but then she suffered recurrent DKA on 5/15.  She was also found to have a  postobstructive pneumonia, and a large pericardial effusion. Subxiphoid pericardial window on 07/12/17.      PT Comments    Pt admitted with above diagnosis. Pt currently with functional limitations due to the deficits listed below (see PT Problem List). Pt was able to ambulate with min guard assist with RW with a little better stability but still with significant fatigue by the time she returned to chair.  Pt will benefit from skilled PT to increase their independence and safety with mobility to allow discharge to the venue listed below.     Follow Up Recommendations  SNF;Supervision/Assistance - 24 hour     Equipment Recommendations  3in1 (PT)    Recommendations for Other Services OT consult     Precautions / Restrictions Precautions Precautions: Fall Precaution Comments: monitor vitals Restrictions Weight Bearing Restrictions: No    Mobility  Bed Mobility Overal bed mobility: Needs Assistance Bed Mobility: Supine to Sit;Sit to Supine     Supine to sit: Min assist;HOB elevated     General bed mobility comments: Assist for elevation of trunk.   Transfers Overall transfer level: Needs assistance Equipment used: Rolling walker (2 wheeled) Transfers: Sit to/from [facility] Sit to Stand: Min guard         General transfer comment: fatigues easily, cues for hand placement  Ambulation/Gait Ambulation/Gait  assistance: Min guard Ambulation Distance (Feet): 145 Feet Assistive device: Rolling walker (2 wheeled) Gait Pattern/deviations: Step-through pattern;Decreased stride length;Trunk flexed;Drifts right/left;Antalgic   Gait velocity interpretation: <1.31 ft/sec, indicative of household ambulator General Gait Details: Pt was able to ambulate with RW.  Trunk flexed slightly initially but as she fatigues, she flexes more.  Pt with very poor endurance for activity.  Close guard assist needed for safety as pt needs help steering RW as well as cues to stay close to RW.    Stairs             Wheelchair Mobility    Modified Rankin (Stroke Patients Only)       Balance Overall balance assessment: Needs assistance Sitting-balance support: No upper extremity supported;Feet supported Sitting balance-Leahy Scale: Fair     Standing balance support: Bilateral upper extremity supported Standing balance-Leahy Scale: Poor Standing balance comment: relies on UE support                            Cognition Arousal/Alertness: Awake/alert Behavior During Therapy: WFL for tasks assessed/performed Overall Cognitive Status: Within Functional Limits for tasks assessed                                        Exercises      General Comments        Pertinent Vitals/Pain Pain Assessment: No/denies pain  VSS  Home Living  Prior Function            PT Goals (current goals can now be found in the care plan section) Acute Rehab PT Goals Patient Stated Goal: to feel better Progress towards PT goals: Progressing toward goals    Frequency    Min 3X/week      PT Plan Current plan remains appropriate    Co-evaluation              AM-PAC PT "6 Clicks" Daily Activity  Outcome Measure  Difficulty turning over in bed (including adjusting bedclothes, sheets and blankets)?: A Lot Difficulty moving from lying on back to sitting  on the side of the bed? : A Lot Difficulty sitting down on and standing up from a chair with arms (e.g., wheelchair, bedside commode, etc,.)?: A Lot Help needed moving to and from a bed to chair (including a wheelchair)?: A Little Help needed walking in hospital room?: A Little Help needed climbing 3-5 steps with a railing? : Total 6 Click Score: 13    End of Session Equipment Utilized During Treatment: Gait belt Activity Tolerance: Patient limited by fatigue Patient left: with call bell/phone within reach;in chair;with chair alarm set Nurse Communication: Mobility status PT Visit Diagnosis: Difficulty in walking, not elsewhere classified (R26.2);Muscle weakness (generalized) (M62.81)     Time: 7680-8811 PT Time Calculation (min) (ACUTE ONLY): 15 min  Charges:  $Gait Training: 8-22 mins                    G Codes:       Alexys Gassett,PT Acute Rehabilitation [phone] (pager)    Denice Paradise 07/18/2017, 1:39 PM

## 2017-07-18 NOTE — Progress Notes (Signed)
Patient IV and tele removed. [facility] to transport patient to SNF [facility] via [facility]. Report called to [facility].

## 2017-07-19 ENCOUNTER — Encounter: Payer: Self-pay | Admitting: Adult Health

## 2017-07-19 ENCOUNTER — Non-Acute Institutional Stay (SKILLED_NURSING_FACILITY): Payer: Medicare Other | Admitting: Adult Health

## 2017-07-19 ENCOUNTER — Encounter: Payer: Self-pay | Admitting: Family Medicine

## 2017-07-19 DIAGNOSIS — Z794 Long term (current) use of insulin: Secondary | ICD-10-CM

## 2017-07-19 DIAGNOSIS — I3139 Other pericardial effusion (noninflammatory): Secondary | ICD-10-CM

## 2017-07-19 DIAGNOSIS — E1110 Type 2 diabetes mellitus with ketoacidosis without coma: Secondary | ICD-10-CM | POA: Diagnosis not present

## 2017-07-19 DIAGNOSIS — D508 Other iron deficiency anemias: Secondary | ICD-10-CM

## 2017-07-19 DIAGNOSIS — E039 Hypothyroidism, unspecified: Secondary | ICD-10-CM | POA: Diagnosis not present

## 2017-07-19 DIAGNOSIS — J9601 Acute respiratory failure with hypoxia: Secondary | ICD-10-CM

## 2017-07-19 DIAGNOSIS — C3491 Malignant neoplasm of unspecified part of right bronchus or lung: Secondary | ICD-10-CM

## 2017-07-19 DIAGNOSIS — I313 Pericardial effusion (noninflammatory): Secondary | ICD-10-CM | POA: Diagnosis not present

## 2017-07-19 DIAGNOSIS — E43 Unspecified severe protein-calorie malnutrition: Secondary | ICD-10-CM | POA: Diagnosis not present

## 2017-07-19 DIAGNOSIS — R740 Nonspecific elevation of levels of transaminase and lactic acid dehydrogenase [LDH]: Secondary | ICD-10-CM | POA: Diagnosis not present

## 2017-07-19 DIAGNOSIS — R7401 Elevation of levels of liver transaminase levels: Secondary | ICD-10-CM

## 2017-07-19 DIAGNOSIS — I739 Peripheral vascular disease, unspecified: Secondary | ICD-10-CM

## 2017-07-19 NOTE — Progress Notes (Signed)
Location:  [place] Room Number: 115-A Place of Service:  SNF (31) Provider:  Durenda Age, NP  Patient Care Team: [name], DO as PCP - General (Family Medicine) Curt Bears, MD as Consulting Physician (Oncology) Greg Cutter, LCSW as [employer] (Licensed Clinical Social Worker)  Extended Emergency Contact Information Primary Emergency Contact: [place], [place] [address] [address] of [place] Phone: [phone] Mobile Phone: [phone] Relation: Son  Code Status:  Full Code  Goals of care: Advanced Directive information Advanced Directives 07/12/2017  Does Patient Have a Medical Advance Directive? No  Would patient like information on creating a medical advance directive? No - Patient declined     Chief Complaint  Patient presents with  . Acute Visit    Hospital followup status post admission at [facility] 5/11-5/23/19 for diabetic ketoacidosis    HPI:  Pt is a 67 y.o. female seen today for hospital followup.  She was admitted to [place] on 07/18/17 for short-term rehabilitation following an admission at [facility] 5/11-5/23/19 for diabetic ketoacidosis. Hospital stay was complicated by post obstructive pneumonia and was found to have pericardial effusion. Pericardial window was placed on 5/17 and drain was removed on 5/20. She has PMH of iron deficiency anemia, hypertension, stage 4 adenocarcinoma of right lung and type 2 diabetes mellitus. She was seen in the room today. Last night it was reported that she complained of chest pain but refused to go to the hospital once the paramedics came. A single suture was noted on her epigastric area and currently on O2 @ 2L/min continuously. She complained of lower back and right shoulder pain, 6/10.    Past Medical History:  Diagnosis Date  . Adenocarcinoma of right lung, stage 4 ([place]) 11/30/2015  . Adult  failure to thrive   . Complication of anesthesia    hard to wake up  . Diabetes mellitus type 2 in nonobese (HCC)   . Eczema   . Encounter for antineoplastic chemotherapy 11/30/2015  . Heart murmur    told at age 71 but never has had any problems  . History of radiation therapy 01/26/16-02/07/16   right pelvis 30 Gy in 10 fractions  . HTN (hypertension) 02/01/2016  . 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 Iron deficiency anemia due to chronic blood loss 12/10/2016   Past Surgical History:  Procedure Laterality Date  . ABDOMINAL AORTOGRAM W/LOWER EXTREMITY N/A 05/29/2017   Procedure: ABDOMINAL AORTOGRAM W/LOWER EXTREMITY;  Surgeon: Serafina Mitchell, MD;  Location: [place] CV LAB;  Service: Cardiovascular;  Laterality: N/A;  . ABDOMINAL HYSTERECTOMY    . OOPHORECTOMY  1996   tumor removed from right ovary  . PERIPHERAL VASCULAR ATHERECTOMY  05/29/2017   Procedure: PERIPHERAL VASCULAR ATHERECTOMY;  Surgeon: Serafina Mitchell, MD;  Location: [place] CV LAB;  Service: Cardiovascular;;  right superficiacl femoral  . SUBXYPHOID PERICARDIAL WINDOW N/A 07/12/2017   Procedure: SUBXYPHOID PERICARDIAL WINDOW;  Surgeon: Ivin Poot, MD;  Location: [place];  Service: Thoracic;  Laterality: N/A;  . TONSILLECTOMY    . VIDEO BRONCHOSCOPY WITH ENDOBRONCHIAL NAVIGATION N/A 11/18/2015   Procedure: VIDEO BRONCHOSCOPY WITH ENDOBRONCHIAL NAVIGATION;  Surgeon: Melrose Nakayama, MD;  Location: [place];  Service: Thoracic;  Laterality: N/A;  . VIDEO BRONCHOSCOPY WITH ENDOBRONCHIAL ULTRASOUND N/A 11/18/2015   Procedure: VIDEO BRONCHOSCOPY WITH ENDOBRONCHIAL ULTRASOUND;  Surgeon: Melrose Nakayama, MD;  Location: [place];  Service: Thoracic;  Laterality: N/A;    Allergies  Allergen Reactions  . Penicillins Swelling    SWELLING REACTION UNSPECIFIED  PATIENT HAS HAD A PCN REACTION WITH IMMEDIATE RASH, FACIAL/TONGUE/THROAT SWELLING, SOB, OR LIGHTHEADEDNESS WITH HYPOTENSION:  #  #  YES  #  #  Has patient  had a PCN reaction causing severe rash involving mucus membranes or skin necrosis: No Has patient had a PCN reaction that required hospitalization No Has patient had a PCN reaction occurring within the last 10 years: No If all of the above answers are "NO", then may proceed with Cephalosporin use.    Outpatient Encounter Medications as of 07/19/2017  Medication Sig  . Amino Acids-Protein Hydrolys (FEEDING SUPPLEMENT, PRO-STAT SUGAR FREE 64,) LIQD Take 30 mLs by mouth 2 (two) times daily.  Marland Kitchen aspirin EC 81 MG EC tablet Take 1 tablet (81 mg total) by mouth daily.  . cetirizine (ZYRTEC) 10 MG tablet TAKE 1 TABLET(10 MG) BY MOUTH DAILY  . clopidogrel (PLAVIX) 75 MG tablet Take 1 tablet (75 mg total) by mouth daily with breakfast.  . diclofenac sodium (VOLTAREN) 1 % GEL Apply layer to chest 4 times daily as needed for pain.  . famotidine (PEPCID AC) 10 MG chewable tablet Chew 10 mg by mouth daily as needed for heartburn.  . ferrous sulfate 325 (65 FE) MG tablet Take 325 mg by mouth daily.  . insulin aspart (NOVOLOG FLEXPEN) 100 UNIT/ML FlexPen Inject 0-15 Units into the skin 3 (three) times daily with meals. SSI:  70-120 = 0 units, 121-150 = 2 units, 151-200 = 3 units, 201-250 = 5 units, 251-300 = 8 units, 301-350 = 11 units, 351-400 = 15 units, greater than 400 call provider  . insulin glargine (LANTUS) 100 UNIT/ML injection Inject 0.06 mLs (6 Units total) into the skin at bedtime.  Marland Kitchen lactulose (CHRONULAC) 10 GM/15ML solution Take 15 mLs (10 g total) by mouth daily as needed for mild constipation.  Marland Kitchen levothyroxine (SYNTHROID, LEVOTHROID) 100 MCG tablet TAKE 1 TABLET(100 MCG) BY MOUTH DAILY BEFORE BREAKFAST  . megestrol (MEGACE ES) 625 MG/5ML suspension Take 5 mLs (625 mg total) by mouth daily.  . Multiple Vitamin (MULTIVITAMIN) tablet Take 1 tablet by mouth daily.  . Nutritional Supplements (FEEDING SUPPLEMENT, GLUCERNA 1.2 CAL,) LIQD Take 237 mLs by mouth daily at 8 pm.  . oxyCODONE-acetaminophen  (PERCOCET) 10-325 MG tablet Take 1 tablet by mouth every 12 (twelve) hours as needed for pain.  . polyethylene glycol (MIRALAX / GLYCOLAX) packet Take 17 g by mouth daily.  Marland Kitchen senna-docusate (SENOKOT-S) 8.6-50 MG tablet Take 1 tablet by mouth at bedtime.  . blood glucose meter kit and supplies KIT Dispense based on patient and insurance preference. Use up to four times daily as directed. (FOR ICD-9 250.00, 250.01). (Patient not taking: Reported on 07/19/2017)  . Insulin Pen Needle (B-D ULTRAFINE III SHORT PEN) 31G X 8 MM MISC USE AS DIRECTED WITH INSULIN (Patient not taking: Reported on 07/19/2017)  . [DISCONTINUED] insulin aspart (NOVOLOG) 100 UNIT/ML injection Inject 0-15 Units into the skin 3 (three) times daily with meals.   No facility-administered encounter medications on file as of 07/19/2017.     Review of Systems  GENERAL: No change in appetite, no fatigue, no weight changes, no fever, chills or weakness MOUTH and THROAT: Denies oral discomfort, gingival pain or bleeding, pain from teeth or hoarseness   RESPIRATORY: no cough, SOB, DOE, wheezing, hemoptysis CARDIAC: No chest pain, edema or palpitations GI: No abdominal pain, diarrhea, constipation, heart burn,  nausea or vomiting GU: Denies dysuria, frequency, hematuria, incontinence, or discharge PSYCHIATRIC: Denies feelings of depression or anxiety. No report of hallucinations, insomnia, paranoia, or agitation    There is no immunization history on file for this patient. Pertinent  Health Maintenance Due  Topic Date Due  . FOOT EXAM  07/07/1961  . OPHTHALMOLOGY EXAM  07/07/1961  . URINE MICROALBUMIN  07/07/1961  . MAMMOGRAM  07/07/2001  . DEXA SCAN  07/07/2016  . PNA vac Low Risk Adult (1 of 2 - PCV13) 07/07/2016  . INFLUENZA VACCINE  11/26/2017 (Originally 09/26/2017)  . HEMOGLOBIN A1C  11/09/2017  . COLONOSCOPY  12/08/2018   Fall Risk  04/08/2017 02/22/2016 01/16/2016  Falls in the past year? No Yes Yes  Number falls in past  yr: - 1 1  Injury with Fall? - Yes No      Vitals:   07/19/17 1156 07/19/17 1157  BP: (!) 135/58 (!) 116/59  Pulse: 95   Resp: 20   Temp: 98.2 F (36.8 C)   TempSrc: Oral   SpO2: 100%   Weight: 93 lb 7.7 oz (42.4 kg)   Height: 5' 3"  (1.6 m)    Body mass index is 16.56 kg/m.  Physical Exam  GENERAL APPEARANCE:  Cachectic SKIN:  A single black suture on epigastric area MOUTH and THROAT: Lips are without lesions. Oral mucosa is moist and without lesions. Tongue is normal in shape, size, and color and without lesions RESPIRATORY: Breathing is even & unlabored, BS CTAB, O2 @ 2L/min via [place] continuously CARDIAC: RRR, no murmur,no extra heart sounds, no edema GI: Abdomen soft, normal BS, no masses EXTREMITIES:  Able to move X 4 extremities PSYCHIATRIC: Alert and oriented X 3. Affect and behavior are appropriate   Labs reviewed: Recent Labs    07/16/17 0419 07/17/17 0404 07/17/17 1756 07/18/17 0351  NA 138 139  --  137  K 4.4 5.3* 5.4* 4.5  CL 110 110  --  110  CO2 23 24  --  23  GLUCOSE 231* 202*  --  68  BUN 12 12  --  9  CREATININE 0.95 0.99  --  0.90  CALCIUM 7.5* 7.8*  --  7.8*  MG 1.5* 1.7  --  1.6*   Recent Labs    07/16/17 0419 07/17/17 0404 07/18/17 0351  AST 398* 290* 246*  ALT 244* 206* 177*  ALKPHOS 556* 774* 1,019*  BILITOT 1.1 1.5* 1.3*  PROT 4.3* 4.7* 4.5*  ALBUMIN 1.8* 1.9* 1.9*   Recent Labs    05/10/17 0354  06/17/17 1331 07/06/17 1312  07/16/17 0419 07/17/17 0404 07/18/17 0351  WBC 8.9   < > 7.3 13.2*   < > 11.4* 14.5* 15.0*  NEUTROABS 6.4  --  5.4 9.9*  --   --   --   --   HGB 9.4*   < > 9.8* 9.5*   < > 9.4* 9.8* 8.7*  HCT 26.7*   < > 29.4* 26.0*   < > 26.9* 27.4* 23.9*  MCV 79.0   < > 87.7 80.0   < > 80.8 79.7 79.1  PLT 320   < > 400 319   < > 340 343 341   < > = values in this interval not displayed.   Lab Results  Component Value Date   TSH 13.110 (H) 07/10/2017   Lab Results  Component Value Date   HGBA1C 8.8 (H)  05/09/2017   Lab Results  Component Value Date  CHOL 145 04/11/2017   HDL 41 04/11/2017   LDLCALC 84 04/11/2017   TRIG 98 04/11/2017   CHOLHDL 3.5 04/11/2017    Significant Diagnostic Results in last 30 days:  Dg Chest 2 View  Result Date: 07/16/2017 CLINICAL DATA:  Pericardial drain. EXAM: CHEST - 2 VIEW COMPARISON:  07/14/2017. FINDINGS: 0710 hours. Pericardial drain has been removed in the interval. Right IJ central line has been removed. Lungs are hyperexpanded with underlying chronic interstitial coarsening. Left base atelectasis associated with small bilateral pleural effusions, left greater than right. The cardiopericardial silhouette is within normal limits for size. The visualized bony structures of the thorax are intact. Telemetry leads overlie the chest. IMPRESSION: 1. Interval removal of pericardial drain and right IJ central line. 2. Left base atelectasis with bilateral pleural effusions. 3. Hyperexpansion with underlying chronic interstitial coarsening. Electronically Signed   By: Misty Stanley M.D.   On: 07/16/2017 09:19   Dg Chest 2 View  Result Date: 07/06/2017 CLINICAL DATA:  Severe chest pain, heart racing for while but worse this morning, awoke patient from sleep, RIGHT shoulder and chest pain, stage IV lung cancer on chemotherapy, tachypnea, hypertension, type II diabetes mellitus, former smoker EXAM: CHEST - 2 VIEW COMPARISON:  05/23/2017; correlation interval CT chest 06/10/2017 FINDINGS: Borderline enlargement of cardiac silhouette. Atherosclerotic calcification aorta. Mediastinal contours and pulmonary vascularity normal. Emphysematous changes consistent with COPD. No definite acute infiltrate, pleural effusion or pneumothorax. Bones demineralized. IMPRESSION: COPD changes without acute abnormality. Electronically Signed   By: Lavonia Dana M.D.   On: 07/06/2017 13:31   Ct Angio Chest Pe W Or Wo Contrast  Result Date: 07/08/2017 CLINICAL DATA:  Chest discomfort.   Possible pulmonary embolus. EXAM: CT ANGIOGRAPHY CHEST WITH CONTRAST TECHNIQUE: Multidetector CT imaging of the chest was performed using the standard protocol during bolus administration of intravenous contrast. Multiplanar CT image reconstructions and MIPs were obtained to evaluate the vascular anatomy. CONTRAST:  25m ISOVUE-370 IOPAMIDOL (ISOVUE-370) INJECTION 76% COMPARISON:  06/10/2017. FINDINGS: Cardiovascular: Negative for pulmonary embolus. Atherosclerotic calcification of the arterial vasculature, including coronary arteries. Pulmonary arteries and heart are enlarged. New moderate pericardial effusion. Mediastinum/Nodes: Low internal jugular lymph nodes on the left measure up to 8 mm. No pathologically enlarged mediastinal lymph nodes. Right infrahilar mass measures approximately 3.3 x 3.6 cm and markedly narrows the right superior pulmonary vein. There is encasement and mild narrowing of the right middle and right lower lobe pulmonary arteries. Findings are similar to 06/10/2017. No left hilar adenopathy. Subpectoral and axillary lymph nodes measure up to 7 mm on the left. Esophagus is grossly unremarkable. Lungs/Pleura: Severe centrilobular emphysema with bullous changes in the right apex. New collapse/consolidation in the right lower lobe obscures a previously measured nodule in the superior segment right lower lobe. Small right pleural effusion is new and is partially loculated in the upper right hemithorax. There is obstruction of the right lower lobe bronchus by the right infrahilar mass, which is new from 06/10/2017. Associated fluid-filled bronchi in the right lower lobe. Upper Abdomen: Visualized portion of the liver is unremarkable. Irregular right adrenal gland mass measures 2.4 x 3.8 cm, previously 2.1 x 3.4 cm. Visualized portions of the left adrenal gland and kidneys are unremarkable. Previously measured ill-defined low-attenuation lesion in the spleen is not readily visualized, possibly due  to differences in bolus timing. Visualized portions of the pancreas, stomach and bowel are grossly unremarkable. High aortocaval lymph node measures 10 mm, similar. Musculoskeletal: No worrisome lytic or sclerotic lesions. Degenerative changes  in the spine. Review of the MIP images confirms the above findings. IMPRESSION: 1. Negative for pulmonary embolus. 2. Right infrahilar mass is grossly stable in size but with new obstruction of the right lower lobe bronchus and postobstructive pneumonitis/pneumonia in the right lower lobe. Previously measured nodule in the superior segment right lower lobe is now obscured. 3. Enlarging right adrenal metastasis. Previously seen splenic lesion is not well visualized today, possibly due to bolus timing. 4. New small right pleural effusion. 5. New moderate pericardial effusion. 6. Enlarged pulmonary arteries, indicative of pulmonary arterial hypertension. 7.  Aortic atherosclerosis (ICD10-170.0). 8.  Emphysema (ICD10-J43.9). Electronically Signed   By: Lorin Picket M.D.   On: 07/08/2017 12:00   Nm Pulmonary Perf And Vent  Result Date: 07/07/2017 CLINICAL DATA:  Chest pain, shortness of breath, tachycardia, abnormal renal function, history lung cancer, hypertension, type II diabetes mellitus, former smoker EXAM: NUCLEAR MEDICINE VENTILATION - PERFUSION LUNG SCAN TECHNIQUE: Ventilation images were obtained in multiple projections using inhaled aerosol Tc-54mDTPA. Perfusion images were obtained in multiple projections after intravenous injection of Tc-912mAA. RADIOPHARMACEUTICALS:  28 mCi of Tc-9968mPA aerosol inhalation and 4 mCi Tc99m9m IV COMPARISON:  None Correlation: Chest radiograph 07/06/2017, CT chest 05/21/2017 FINDINGS: Ventilation: Central airway deposition of aerosol at RIGHT hilum. Absent ventilation to RIGHT lower lobe. Segmental/subsegmental diminished ventilation laterally in RIGHT upper lobe. No additional ventilatory defects. Perfusion: Present though  diminished perfusion in the LEFT lower lobe, better than ventilation. Matching diminished perfusion laterally in RIGHT upper lobe. Nonsegmental irregular perfusion in lateral LEFT upper lobe. Chest radiograph: Upper normal heart size with question pulmonary vascular congestion, emphysematous changes, and RIGHT hilar/perihilar mass. IMPRESSION: Matching ventilation and perfusion defect at lateral RIGHT upper lobe corresponding to site of significant emphysematous changes by CT. Absent ventilation and diminished perfusion in the RIGHT lower lobe in a patient with a known RIGHT hilar/perihilar mass. No other segmental or subsegmental perfusion defects identified. Findings represent an intermediate probability for pulmonary embolism. Electronically Signed   By: MarkLavonia Dana.   On: 07/07/2017 12:48   Dg Chest Port 1 View  Result Date: 07/14/2017 CLINICAL DATA:  Chest tube in place. EXAM: PORTABLE CHEST 1 VIEW COMPARISON:  07/13/2017 and CT 07/08/2017 FINDINGS: Right IJ central venous catheter unchanged with tip over the SVC. Pericardial drain unchanged. Lungs are adequately inflated with stable subtle bibasilar opacification likely small amount of pleural fluid/atelectasis. No pneumothorax. Stable right hilar prominence with known mass in this region by CT. Cardiomediastinal silhouette and remainder of the exam is unchanged. IMPRESSION: Stable minimal bibasilar opacification likely small amount of bilateral pleural fluid/atelectasis. Stable right hilar prominence with known mass in this region by recent CT. Tubes and lines as described. Electronically Signed   By: DaniMarin Olp.   On: 07/14/2017 09:12   Dg Chest Port 1 View  Result Date: 07/13/2017 CLINICAL DATA:  Pericardial effusion. EXAM: PORTABLE CHEST 1 VIEW COMPARISON:  07/12/2017 FINDINGS: Right IJ central venous catheter with tip at the cavoatrial junction unchanged. Catheter projecting from below with tip over the left heart unchanged likely  pericardial catheter. Lungs are adequately inflated with minimal bibasilar hazy opacification likely small amount of pleural fluid/atelectasis. Cardiomediastinal silhouette and remainder the exam is unchanged. IMPRESSION: Stable mild hazy bibasilar opacification likely pleural fluid/atelectasis. Tubes and lines as described. Electronically Signed   By: DaniMarin Olp.   On: 07/13/2017 07:48   Dg Chest Port 1 View  Result Date: 07/12/2017 CLINICAL DATA:  Status post subxiphoid  pericardial window. EXAM: PORTABLE CHEST 1 VIEW COMPARISON:  07/08/2017 CT, 07/06/2017 chest radiograph and prior studies FINDINGS: No significant change in cardiomediastinal silhouette. A RIGHT IJ central venous catheter is noted with tip overlying the SUPERIOR cavoatrial junction. A pericardial drain is now noted. Mild bibasilar atelectasis noted with possible trace effusions. There is no evidence of pneumothorax. IMPRESSION: RIGHT IJ central venous catheter and pericardial drain. No pneumothorax. Mild bibasilar atelectasis with possible trace effusions. Electronically Signed   By: Margarette Canada M.D.   On: 07/12/2017 18:54   Dg Chest Portable 1 View  Result Date: 07/06/2017 CLINICAL DATA:  Acute shortness of breath. History of stage IV lung cancer. EXAM: PORTABLE CHEST 1 VIEW COMPARISON:  07/06/2017 chest radiograph, 06/10/2017 CT and prior studies FINDINGS: RIGHT hilar/perihilar mass again noted. UPPER limits normal heart size identified. Apparent increase in pulmonary vascular congestion noted. No pleural effusion or pneumothorax noted. No acute bony abnormalities are present. IMPRESSION: Question pulmonary vascular congestion. Otherwise unchanged appearance of the chest with RIGHT hilar/perihilar mass. Electronically Signed   By: Margarette Canada M.D.   On: 07/06/2017 19:14   US Abdomen Limited Ruq  Result Date: 07/11/2017 CLINICAL DATA:  Abnormal liver function tests. EXAM: ULTRASOUND ABDOMEN LIMITED RIGHT UPPER QUADRANT  COMPARISON:  CT scan of May 21, 2017. FINDINGS: Gallbladder: No gallstones or wall thickening visualized. No sonographic Murphy sign noted by sonographer. Common bile duct: Diameter: 1 mm which is within normal limits. Liver: No focal lesion identified. Within normal limits in parenchymal echogenicity. Portal vein is patent on color Doppler imaging with normal direction of blood flow towards the liver. 4.9 x 3.4 x 1.9 cm hypoechoic mass is noted between right kidney and liver which corresponds to adrenal mass described on prior CT scan. IMPRESSION: 4.9 x 3.4 x 1.9 cm right adrenal mass is noted most consistent with metastatic disease. No other abnormality seen in the right upper quadrant of the abdomen. Electronically Signed   By: Marijo Conception, M.D.   On: 07/11/2017 08:56    Assessment/Plan  1. Pericardial effusion - pericardial window was placed on 5/17 and drain was removed on 5/20, follow-up with cardiothoracic on 08/07/17   2. Acute respiratory failure with hypoxia (HCC) - thought to be due to pneumonia and pericardial effusion, taper off O2 as tolerated   3. Type 2 diabetes mellitus with ketoacidosis without coma, with long-term current use of insulin (HCC) - continue Lantus 100 units/ml 6 units SQ Q HS and Novolog SSI TID Lab Results  Component Value Date   HGBA1C 8.8 (H) 05/09/2017     4. Other iron deficiency anemia - continue FeSO4 325 mg daily Lab Results  Component Value Date   HGB 8.7 (L) 07/18/2017     5. Severe protein-calorie malnutrition ([place]) - Body mass index is 16.56 kg/m. will continue Megace 625 mg/5 ml daily, Glucerna 237 ml daily   6. Transaminitis - will repeat lipid panel in 2 weeks   7. PAD (peripheral artery disease) (HCC) - has right 5th toe with eschar, continue ASA 81 mg daily and Plavix 75 mg daily, continue treatment to right 5th toe daily   8. Hypothyroidism, unspecified type - continue  Levothyroxine 100 mcg daily and for repeat tsh in 1  month Lab Results  Component Value Date   TSH 13.110 (H) 07/10/2017     9. Adenocarcinoma of the right lung - follow-up with Dr. Curt Bears at [facility] on 07/29/17      Family/  staff Communication: Discussed plan of care with patient.  Labs/tests ordered:  Liver panel in 2 weeks  Goals of care:   Short-term care   Durenda Age, NP [facility] [phone] (Monday-Friday 8:00 a.m. - 5:00 p.m.) [phone] (after hours)

## 2017-07-21 ENCOUNTER — Inpatient Hospital Stay (HOSPITAL_COMMUNITY): Payer: Medicare Other

## 2017-07-21 ENCOUNTER — Emergency Department (HOSPITAL_COMMUNITY): Payer: Medicare Other

## 2017-07-21 ENCOUNTER — Encounter (HOSPITAL_COMMUNITY): Payer: Self-pay | Admitting: Pharmacy Technician

## 2017-07-21 ENCOUNTER — Inpatient Hospital Stay (HOSPITAL_COMMUNITY)
Admission: EM | Admit: 2017-07-21 | Discharge: 2017-07-27 | DRG: 314 | Disposition: E | Payer: Medicare Other | Attending: Critical Care Medicine | Admitting: Critical Care Medicine

## 2017-07-21 DIAGNOSIS — Z87891 Personal history of nicotine dependence: Secondary | ICD-10-CM | POA: Diagnosis not present

## 2017-07-21 DIAGNOSIS — R001 Bradycardia, unspecified: Secondary | ICD-10-CM | POA: Diagnosis present

## 2017-07-21 DIAGNOSIS — E1010 Type 1 diabetes mellitus with ketoacidosis without coma: Secondary | ICD-10-CM | POA: Diagnosis present

## 2017-07-21 DIAGNOSIS — R570 Cardiogenic shock: Secondary | ICD-10-CM | POA: Diagnosis not present

## 2017-07-21 DIAGNOSIS — Z681 Body mass index (BMI) 19 or less, adult: Secondary | ICD-10-CM | POA: Diagnosis not present

## 2017-07-21 DIAGNOSIS — E860 Dehydration: Secondary | ICD-10-CM | POA: Diagnosis not present

## 2017-07-21 DIAGNOSIS — Z66 Do not resuscitate: Secondary | ICD-10-CM | POA: Diagnosis not present

## 2017-07-21 DIAGNOSIS — E785 Hyperlipidemia, unspecified: Secondary | ICD-10-CM | POA: Diagnosis present

## 2017-07-21 DIAGNOSIS — J910 Malignant pleural effusion: Secondary | ICD-10-CM | POA: Diagnosis not present

## 2017-07-21 DIAGNOSIS — E1311 Other specified diabetes mellitus with ketoacidosis with coma: Secondary | ICD-10-CM

## 2017-07-21 DIAGNOSIS — Z79890 Hormone replacement therapy (postmenopausal): Secondary | ICD-10-CM

## 2017-07-21 DIAGNOSIS — C7989 Secondary malignant neoplasm of other specified sites: Secondary | ICD-10-CM | POA: Diagnosis present

## 2017-07-21 DIAGNOSIS — C801 Malignant (primary) neoplasm, unspecified: Secondary | ICD-10-CM

## 2017-07-21 DIAGNOSIS — Z7902 Long term (current) use of antithrombotics/antiplatelets: Secondary | ICD-10-CM

## 2017-07-21 DIAGNOSIS — I318 Other specified diseases of pericardium: Secondary | ICD-10-CM | POA: Diagnosis not present

## 2017-07-21 DIAGNOSIS — J9600 Acute respiratory failure, unspecified whether with hypoxia or hypercapnia: Secondary | ICD-10-CM | POA: Diagnosis present

## 2017-07-21 DIAGNOSIS — R64 Cachexia: Secondary | ICD-10-CM | POA: Diagnosis not present

## 2017-07-21 DIAGNOSIS — I314 Cardiac tamponade: Secondary | ICD-10-CM | POA: Diagnosis present

## 2017-07-21 DIAGNOSIS — Z880 Allergy status to penicillin: Secondary | ICD-10-CM

## 2017-07-21 DIAGNOSIS — J439 Emphysema, unspecified: Secondary | ICD-10-CM | POA: Diagnosis present

## 2017-07-21 DIAGNOSIS — E039 Hypothyroidism, unspecified: Secondary | ICD-10-CM | POA: Diagnosis not present

## 2017-07-21 DIAGNOSIS — N179 Acute kidney failure, unspecified: Secondary | ICD-10-CM | POA: Diagnosis present

## 2017-07-21 DIAGNOSIS — J9601 Acute respiratory failure with hypoxia: Secondary | ICD-10-CM | POA: Diagnosis not present

## 2017-07-21 DIAGNOSIS — D509 Iron deficiency anemia, unspecified: Secondary | ICD-10-CM | POA: Diagnosis not present

## 2017-07-21 DIAGNOSIS — R0682 Tachypnea, not elsewhere classified: Secondary | ICD-10-CM | POA: Diagnosis not present

## 2017-07-21 DIAGNOSIS — G9341 Metabolic encephalopathy: Secondary | ICD-10-CM | POA: Diagnosis not present

## 2017-07-21 DIAGNOSIS — Z794 Long term (current) use of insulin: Secondary | ICD-10-CM

## 2017-07-21 DIAGNOSIS — Z4682 Encounter for fitting and adjustment of non-vascular catheter: Secondary | ICD-10-CM | POA: Diagnosis not present

## 2017-07-21 DIAGNOSIS — I313 Pericardial effusion (noninflammatory): Principal | ICD-10-CM | POA: Diagnosis present

## 2017-07-21 DIAGNOSIS — J9602 Acute respiratory failure with hypercapnia: Secondary | ICD-10-CM | POA: Diagnosis not present

## 2017-07-21 DIAGNOSIS — E46 Unspecified protein-calorie malnutrition: Secondary | ICD-10-CM | POA: Diagnosis not present

## 2017-07-21 DIAGNOSIS — Z923 Personal history of irradiation: Secondary | ICD-10-CM

## 2017-07-21 DIAGNOSIS — I340 Nonrheumatic mitral (valve) insufficiency: Secondary | ICD-10-CM

## 2017-07-21 DIAGNOSIS — Z90710 Acquired absence of both cervix and uterus: Secondary | ICD-10-CM

## 2017-07-21 DIAGNOSIS — E1011 Type 1 diabetes mellitus with ketoacidosis with coma: Secondary | ICD-10-CM

## 2017-07-21 DIAGNOSIS — I10 Essential (primary) hypertension: Secondary | ICD-10-CM | POA: Diagnosis not present

## 2017-07-21 DIAGNOSIS — I351 Nonrheumatic aortic (valve) insufficiency: Secondary | ICD-10-CM

## 2017-07-21 DIAGNOSIS — Z9911 Dependence on respirator [ventilator] status: Secondary | ICD-10-CM

## 2017-07-21 DIAGNOSIS — C3491 Malignant neoplasm of unspecified part of right bronchus or lung: Secondary | ICD-10-CM | POA: Diagnosis present

## 2017-07-21 DIAGNOSIS — Z7982 Long term (current) use of aspirin: Secondary | ICD-10-CM

## 2017-07-21 DIAGNOSIS — I3131 Malignant pericardial effusion in diseases classified elsewhere: Secondary | ICD-10-CM

## 2017-07-21 DIAGNOSIS — R0602 Shortness of breath: Secondary | ICD-10-CM | POA: Diagnosis not present

## 2017-07-21 LAB — BASIC METABOLIC PANEL
ANION GAP: 22 — AB (ref 5–15)
BUN: 30 mg/dL — AB (ref 6–20)
BUN: 24 mg/dL — ABNORMAL HIGH (ref 6–20)
CALCIUM: 8.2 mg/dL — AB (ref 8.9–10.3)
CHLORIDE: 110 mmol/L (ref 101–111)
CO2: 11 mmol/L — AB (ref 22–32)
Calcium: 6.7 mg/dL — ABNORMAL LOW (ref 8.9–10.3)
Chloride: 101 mmol/L (ref 101–111)
Creatinine, Ser: 2.11 mg/dL — ABNORMAL HIGH (ref 0.44–1.00)
Creatinine, Ser: 1.8 mg/dL — ABNORMAL HIGH (ref 0.44–1.00)
GFR calc Af Amer: 27 mL/min — ABNORMAL LOW (ref >60)
GFR calc Af Amer: 33 mL/min — ABNORMAL LOW (ref >60)
GFR calc non Af Amer: 28 mL/min — ABNORMAL LOW (ref >60)
GFR, EST NON AFRICAN AMERICAN: 23 mL/min — AB (ref >60)
GLUCOSE: 557 mg/dL — AB (ref 65–99)
GLUCOSE: 502 mg/dL — AB (ref 65–99)
POTASSIUM: 6.6 mmol/L — AB (ref 3.5–5.1)
POTASSIUM: 4.9 mmol/L (ref 3.5–5.1)
Sodium: 136 mmol/L (ref 135–145)
Sodium: 143 mmol/L (ref 135–145)

## 2017-07-21 LAB — CBC WITH DIFFERENTIAL/PLATELET
Basophils Absolute: 0 10*3/uL (ref 0.0–0.1)
Basophils Relative: 0 %
EOS PCT: 0 %
Eosinophils Absolute: 0 10*3/uL (ref 0.0–0.7)
HCT: 24.4 % — ABNORMAL LOW (ref 36.0–46.0)
Hemoglobin: 8.1 g/dL — ABNORMAL LOW (ref 12.0–15.0)
LYMPHS ABS: 1 10*3/uL (ref 0.7–4.0)
LYMPHS PCT: 6 %
MCH: 29.2 pg (ref 26.0–34.0)
MCHC: 33.2 g/dL (ref 30.0–36.0)
MCV: 88.1 fL (ref 78.0–100.0)
MONO ABS: 1.1 10*3/uL — AB (ref 0.1–1.0)
MONOS PCT: 7 %
Neutro Abs: 14.8 10*3/uL — ABNORMAL HIGH (ref 1.7–7.7)
Neutrophils Relative %: 87 %
PLATELETS: 396 10*3/uL (ref 150–400)
RBC: 2.77 MIL/uL — ABNORMAL LOW (ref 3.87–5.11)
RDW: 19.2 % — AB (ref 11.5–15.5)
WBC: 17 10*3/uL — ABNORMAL HIGH (ref 4.0–10.5)

## 2017-07-21 LAB — POCT I-STAT 3, ART BLOOD GAS (G3+)
ACID-BASE DEFICIT: 25 mmol/L — AB (ref 0.0–2.0)
BICARBONATE: 7.9 mmol/L — AB (ref 20.0–28.0)
O2 Saturation: 59 %
PH ART: 6.879 — AB (ref 7.350–7.450)
TCO2: 9 mmol/L — ABNORMAL LOW (ref 22–32)
pCO2 arterial: 38.9 mmHg (ref 32.0–48.0)
pO2, Arterial: 40 mmHg — CL (ref 83.0–108.0)

## 2017-07-21 LAB — I-STAT CHEM 8, ED
BUN: 26 mg/dL — ABNORMAL HIGH (ref 6–20)
CALCIUM ION: 1.04 mmol/L — AB (ref 1.15–1.40)
Chloride: 105 mmol/L (ref 101–111)
Creatinine, Ser: 1.6 mg/dL — ABNORMAL HIGH (ref 0.44–1.00)
Glucose, Bld: 557 mg/dL (ref 65–99)
HEMATOCRIT: 28 % — AB (ref 36.0–46.0)
Hemoglobin: 9.5 g/dL — ABNORMAL LOW (ref 12.0–15.0)
Potassium: 6.2 mmol/L — ABNORMAL HIGH (ref 3.5–5.1)
SODIUM: 135 mmol/L (ref 135–145)
TCO2: 9 mmol/L — AB (ref 22–32)

## 2017-07-21 LAB — CBG MONITORING, ED
GLUCOSE-CAPILLARY: 473 mg/dL — AB (ref 65–99)
GLUCOSE-CAPILLARY: 338 mg/dL — AB (ref 65–99)
Glucose-Capillary: 501 mg/dL (ref 65–99)

## 2017-07-21 LAB — HEPATIC FUNCTION PANEL
ALBUMIN: 1.8 g/dL — AB (ref 3.5–5.0)
ALK PHOS: 1122 U/L — AB (ref 38–126)
ALT: 142 U/L — AB (ref 14–54)
AST: 362 U/L — ABNORMAL HIGH (ref 15–41)
BILIRUBIN TOTAL: 3.1 mg/dL — AB (ref 0.3–1.2)
Bilirubin, Direct: 2.1 mg/dL — ABNORMAL HIGH (ref 0.1–0.5)
Indirect Bilirubin: 1 mg/dL — ABNORMAL HIGH (ref 0.3–0.9)
TOTAL PROTEIN: 4.6 g/dL — AB (ref 6.5–8.1)

## 2017-07-21 LAB — GLUCOSE, CAPILLARY
GLUCOSE-CAPILLARY: 415 mg/dL — AB (ref 65–99)
GLUCOSE-CAPILLARY: 456 mg/dL — AB (ref 65–99)

## 2017-07-21 LAB — MAGNESIUM: Magnesium: 1.8 mg/dL (ref 1.7–2.4)

## 2017-07-21 LAB — I-STAT ARTERIAL BLOOD GAS, ED
ACID-BASE DEFICIT: 23 mmol/L — AB (ref 0.0–2.0)
Bicarbonate: 5.1 mmol/L — ABNORMAL LOW (ref 20.0–28.0)
O2 Saturation: 91 %
PH ART: 7.094 — AB (ref 7.350–7.450)
TCO2: 6 mmol/L — ABNORMAL LOW (ref 22–32)
pCO2 arterial: 16.7 mmHg — CL (ref 32.0–48.0)
pO2, Arterial: 82 mmHg — ABNORMAL LOW (ref 83.0–108.0)

## 2017-07-21 LAB — COOXEMETRY PANEL
CARBOXYHEMOGLOBIN: 0.6 % (ref 0.5–1.5)
METHEMOGLOBIN: 1.8 % — AB (ref 0.0–1.5)
O2 Saturation: 15 %
Total hemoglobin: 7.6 g/dL — ABNORMAL LOW (ref 12.0–16.0)

## 2017-07-21 LAB — PHOSPHORUS: Phosphorus: 7.5 mg/dL — ABNORMAL HIGH (ref 2.5–4.6)

## 2017-07-21 LAB — PROCALCITONIN: PROCALCITONIN: 2.92 ng/mL

## 2017-07-21 LAB — LACTIC ACID, PLASMA: Lactic Acid, Venous: 14.8 mmol/L (ref 0.5–1.9)

## 2017-07-21 LAB — BETA-HYDROXYBUTYRIC ACID: BETA-HYDROXYBUTYRIC ACID: 3.27 mmol/L — AB (ref 0.05–0.27)

## 2017-07-21 LAB — MRSA PCR SCREENING: MRSA BY PCR: NEGATIVE

## 2017-07-21 MED ORDER — FENTANYL CITRATE (PF) 100 MCG/2ML IJ SOLN
INTRAMUSCULAR | Status: AC
  Filled 2017-07-21: qty 2

## 2017-07-21 MED ORDER — LEVOTHYROXINE SODIUM 100 MCG IV SOLR: 50.0000 ug | Freq: Every day | INTRAVENOUS | Status: DC

## 2017-07-21 MED ORDER — SODIUM CHLORIDE 0.9 % IV SOLN: INTRAVENOUS | Status: DC | PRN

## 2017-07-21 MED ORDER — NOREPINEPHRINE 4 MG/250ML-% IV SOLN
0.0000 ug/min | INTRAVENOUS | Status: DC
  Administered 2017-07-21: 2 ug/min via INTRAVENOUS

## 2017-07-21 MED ORDER — SODIUM CHLORIDE 0.9 % IV SOLN: INTRAVENOUS | Status: DC

## 2017-07-21 MED ORDER — SODIUM BICARBONATE 8.4 % IV SOLN: 50.0000 meq | Freq: Once | INTRAVENOUS | Status: DC

## 2017-07-21 MED ORDER — SODIUM BICARBONATE 8.4 % IV SOLN
INTRAVENOUS | Status: DC
Start: 2017-07-21 — End: 2017-07-22
  Filled 2017-07-21: qty 50

## 2017-07-21 MED ORDER — NOREPINEPHRINE 4 MG/250ML-% IV SOLN: 10.0000 ug/min | Freq: Once | INTRAVENOUS | Status: DC

## 2017-07-21 MED ORDER — SODIUM CHLORIDE 0.9 % IV BOLUS
2000.0000 mL | Freq: Once | INTRAVENOUS | Status: AC
  Administered 2017-07-21: 2000 mL via INTRAVENOUS

## 2017-07-21 MED ORDER — DEXTROSE-NACL 5-0.45 % IV SOLN: INTRAVENOUS | Status: DC

## 2017-07-21 MED ORDER — SODIUM CHLORIDE 0.9 % IV SOLN
INTRAVENOUS | Status: DC
  Administered 2017-07-21: 4.4 [IU]/h via INTRAVENOUS
  Filled 2017-07-21: qty 1

## 2017-07-21 MED ORDER — POTASSIUM CHLORIDE 10 MEQ/100ML IV SOLN: 10.0000 meq | INTRAVENOUS | Status: DC

## 2017-07-21 MED ORDER — HEPARIN SODIUM (PORCINE) 5000 UNIT/ML IJ SOLN: 5000.0000 [IU] | Freq: Three times a day (TID) | INTRAMUSCULAR | Status: DC

## 2017-07-21 MED ORDER — FENTANYL 2500MCG IN NS 250ML (10MCG/ML) PREMIX INFUSION
25.0000 ug/h | INTRAVENOUS | Status: DC
  Administered 2017-07-21: 50 ug/h via INTRAVENOUS
  Filled 2017-07-21: qty 250

## 2017-07-21 MED ORDER — LACTATED RINGERS IV BOLUS
1000.0000 mL | Freq: Once | INTRAVENOUS | Status: AC
  Administered 2017-07-21: 1000 mL via INTRAVENOUS

## 2017-07-21 MED ORDER — FENTANYL BOLUS VIA INFUSION
25.0000 ug | INTRAVENOUS | Status: DC | PRN
  Filled 2017-07-21: qty 25

## 2017-07-21 MED ORDER — SODIUM BICARBONATE 8.4 % IV SOLN
INTRAVENOUS | Status: AC
  Filled 2017-07-21: qty 100

## 2017-07-21 MED ORDER — FENTANYL CITRATE (PF) 100 MCG/2ML IJ SOLN
50.0000 ug | Freq: Once | INTRAMUSCULAR | Status: AC
  Administered 2017-07-21: 50 ug via INTRAVENOUS

## 2017-07-21 MED ORDER — EPINEPHRINE PF 1 MG/ML IJ SOLN
0.5000 ug/min | INTRAVENOUS | Status: DC
  Administered 2017-07-21: 0.5 ug/min via INTRAVENOUS
  Filled 2017-07-21 (×2): qty 4

## 2017-07-21 MED ORDER — NOREPINEPHRINE 4 MG/250ML-% IV SOLN
0.0000 ug/min | INTRAVENOUS | Status: DC
  Administered 2017-07-21 (×2): 40 ug/min via INTRAVENOUS
  Administered 2017-07-21: 2 ug/min via INTRAVENOUS
  Filled 2017-07-21: qty 250

## 2017-07-21 MED ORDER — VASOPRESSIN 20 UNIT/ML IV SOLN
0.0300 [IU]/min | INTRAVENOUS | Status: DC
  Administered 2017-07-21: 0.03 [IU]/min via INTRAVENOUS
  Filled 2017-07-21 (×2): qty 2

## 2017-07-21 MED ORDER — STERILE WATER FOR INJECTION IV SOLN
INTRAVENOUS | Status: DC
  Filled 2017-07-21 (×3): qty 850

## 2017-07-21 MED ORDER — SODIUM BICARBONATE 8.4 % IV SOLN: 150.0000 meq | Freq: Once | INTRAVENOUS | Status: DC

## 2017-07-21 MED ORDER — PANTOPRAZOLE SODIUM 40 MG PO PACK: 40.0000 mg | PACK | Freq: Every day | ORAL | Status: DC

## 2017-07-21 MED ORDER — MAGNESIUM SULFATE 2 GM/50ML IV SOLN: 2.0000 g | Freq: Once | INTRAVENOUS | Status: DC

## 2017-07-22 LAB — ECHOCARDIOGRAM LIMITED: Height: 63 in

## 2017-07-23 ENCOUNTER — Telehealth: Payer: Self-pay | Admitting: Family Medicine

## 2017-07-23 NOTE — Telephone Encounter (Signed)
Called patient's son Kasandra Knudsen to offer condolences for patient's passing. No questions.

## 2017-07-24 LAB — CULTURE, RESPIRATORY

## 2017-07-24 LAB — CULTURE, RESPIRATORY W GRAM STAIN: Culture: NORMAL

## 2017-07-25 LAB — POCT I-STAT 3, ART BLOOD GAS (G3+)
BICARBONATE: 5.6 mmol/L — AB (ref 20.0–28.0)
O2 SAT: 95 %
PCO2 ART: 47.1 mmHg (ref 32.0–48.0)
PO2 ART: 152 mmHg — AB (ref 83.0–108.0)
Patient temperature: 96.4
TCO2: 7 mmol/L — ABNORMAL LOW (ref 22–32)
pH, Arterial: 6.668 — CL (ref 7.350–7.450)

## 2017-07-25 MED FILL — Medication: Qty: 1 | Status: AC

## 2017-07-26 ENCOUNTER — Telehealth: Payer: Self-pay

## 2017-07-26 LAB — CULTURE, BLOOD (ROUTINE X 2)
CULTURE: NO GROWTH
Culture: NO GROWTH
Special Requests: ADEQUATE

## 2017-07-26 NOTE — Telephone Encounter (Signed)
On 07/26/17 I received a d/c from [facility] (original). The d/c is for cremation.  The patient is a patient of Doctor Scatliffe.   The d/c will be taken to Pulmonary Unit @ [facility] for signature.  On 07/29/17 I received the d/c back from Doctor Halford Chessman who signed the d/c for Doctor Scatliffe.  I got the d/c ready and called the funeral home to let them know the d/c is ready for pickup.

## 2017-07-27 NOTE — ED Notes (Signed)
+   color change ETCO

## 2017-07-27 NOTE — H&P (Signed)
PULMONARY / CRITICAL CARE MEDICINE   Name: Brittany Mercado MRN: [id number] DOB: 1950-04-12    ADMISSION DATE:  July 28, 2017 CONSULTATION DATE:  2017/07/28  REFERRING MD: Dr. 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 Discharged to SNF.   Presents to ED on 5/26 with AMS and Hyperglycemia. Glucose 348. BP 94/50. Given 4L NS. Intubated for airway protection, during intubation patient had witnessed aspiration. While in ED BP dropped to systolic 60 with HR 71Q, requiring Levophed and EPI gtt. Given Concern for Cardiac Tamponade CT Surgery Consulted and STAT ECHO ordered. ECHO with moderate Pericardial Effusion. PCCM asked to admit.    PAST MEDICAL HISTORY :  She  has a past medical history of Adenocarcinoma of right lung, stage 4 ([place]) (11/30/2015), Adult failure to thrive, Complication of anesthesia, Diabetes mellitus type 2 in nonobese ([place]), Eczema, Encounter for antineoplastic chemotherapy (11/30/2015), Heart murmur, History of radiation therapy (01/26/16-02/07/16), HTN (hypertension) (02/01/2016), Hypothyroidism (acquired) (02/22/2016), and Iron deficiency anemia due to chronic blood loss (12/10/2016).  PAST SURGICAL HISTORY: She  has a past surgical history that includes Abdominal hysterectomy; Oophorectomy (1996); Tonsillectomy; Video bronchoscopy with endobronchial ultrasound (N/A, 11/18/2015); Video bronchoscopy with endobronchial navigation (N/A, 11/18/2015); ABDOMINAL AORTOGRAM W/LOWER EXTREMITY (N/A, 05/29/2017); PERIPHERAL VASCULAR ATHERECTOMY (05/29/2017); and Subxyphoid pericardial window (N/A, 07/12/2017).  Allergies  Allergen Reactions  . Penicillins Swelling    SWELLING REACTION UNSPECIFIED  PATIENT HAS HAD A PCN REACTION WITH IMMEDIATE RASH, FACIAL/TONGUE/THROAT  SWELLING, SOB, OR LIGHTHEADEDNESS WITH HYPOTENSION:  #  #  YES  #  #  Has patient had a PCN reaction causing severe rash involving mucus membranes or skin necrosis: No Has patient had a PCN reaction that required hospitalization No Has patient had a PCN reaction occurring within the last 10 years: No If all of the above answers are "NO", then may proceed with Cephalosporin use.    No current facility-administered medications on file prior to encounter.    Current Outpatient Medications on File Prior to Encounter  Medication Sig  . Amino Acids-Protein Hydrolys (FEEDING SUPPLEMENT, PRO-STAT SUGAR FREE 64,) LIQD Take 30 mLs by mouth 2 (two) times daily.  Marland Kitchen aspirin EC 81 MG EC tablet Take 1 tablet (81 mg total) by mouth daily.  . blood glucose meter kit and supplies KIT Dispense based on patient and insurance preference. Use up to four times daily as directed. (FOR ICD-9 250.00, 250.01). (Patient not taking: Reported on 07/19/2017)  . cetirizine (ZYRTEC) 10 MG tablet TAKE 1 TABLET(10 MG) BY MOUTH DAILY  . clopidogrel (PLAVIX) 75 MG tablet Take 1 tablet (75 mg total) by mouth daily with breakfast.  . diclofenac sodium (VOLTAREN) 1 % GEL Apply layer to chest 4 times daily as needed for pain.  . famotidine (PEPCID AC) 10 MG chewable tablet Chew 10 mg by mouth daily as needed for heartburn.  . ferrous sulfate 325 (65 FE) MG tablet Take 325 mg by mouth daily.  . insulin aspart (NOVOLOG FLEXPEN) 100 UNIT/ML FlexPen Inject 0-15 Units into the skin 3 (three) times daily with meals. SSI:  70-120 = 0 units, 121-150 = 2 units, 151-200 = 3 units, 201-250 = 5 units, 251-300 = 8 units, 301-350 = 11 units, 351-400 = 15 units, greater than 400 call provider  . insulin glargine (  LANTUS) 100 UNIT/ML injection Inject 0.06 mLs (6 Units total) into the skin at bedtime.  . Insulin Pen Needle (B-D ULTRAFINE III SHORT PEN) 31G X 8 MM MISC USE AS DIRECTED WITH INSULIN (Patient not taking: Reported on 07/19/2017)  . lactulose  (CHRONULAC) 10 GM/15ML solution Take 15 mLs (10 g total) by mouth daily as needed for mild constipation.  Marland Kitchen levothyroxine (SYNTHROID, LEVOTHROID) 100 MCG tablet TAKE 1 TABLET(100 MCG) BY MOUTH DAILY BEFORE BREAKFAST  . megestrol (MEGACE ES) 625 MG/5ML suspension Take 5 mLs (625 mg total) by mouth daily.  . Multiple Vitamin (MULTIVITAMIN) tablet Take 1 tablet by mouth daily.  . Nutritional Supplements (FEEDING SUPPLEMENT, GLUCERNA 1.2 CAL,) LIQD Take 237 mLs by mouth daily at 8 pm.  . oxyCODONE-acetaminophen (PERCOCET) 10-325 MG tablet Take 1 tablet by mouth every 12 (twelve) hours as needed for pain.  . polyethylene glycol (MIRALAX / GLYCOLAX) packet Take 17 g by mouth daily.  Marland Kitchen senna-docusate (SENOKOT-S) 8.6-50 MG tablet Take 1 tablet by mouth at bedtime.    FAMILY HISTORY:  Her indicated that the status of her neg hx is unknown.   SOCIAL HISTORY: She  reports that she quit smoking about 20 months ago. She has a 15.00 pack-year smoking history. She has never used smokeless tobacco. She reports that she does not drink alcohol or use drugs.  REVIEW OF SYSTEMS:   Unable to review as patient is intubated and sedated   SUBJECTIVE:   VITAL SIGNS: BP 99/75   Pulse 70   Temp (!) 96.1 F (35.6 C) (Rectal)   Resp (!) 25   Ht 5' 3" (1.6 m)   SpO2 (!) 79%   BMI 16.56 kg/m   HEMODYNAMICS:    VENTILATOR SETTINGS: Vent Mode: PRVC FiO2 (%):  [100 %] 100 % Set Rate:  [16 bmp] 16 bmp Vt Set:  [420 mL-450 mL] 420 mL PEEP:  [5 cmH20] 5 cmH20 Plateau Pressure:  [29 cmH20] 29 cmH20  INTAKE / OUTPUT: I/O last 3 completed shifts: In: 0.3 [I.V.:0.3] Out: -   PHYSICAL EXAMINATION: General:  Chronically ill appearing adult female, on vent  Neuro:  Opens eyes to physical stimulation, +gag/cough, does not follow commands  HEENT:  ETT in place  Cardiovascular:  Tachy, no MRG Lungs:  Diminished breaths sounds, coarse, no wheeze/crackles  Abdomen:  Thin, active bowel sounds  Musculoskeletal:   -edema  Skin:  Dry, intact  LABS:  BMET Recent Labs  Lab 07/16/17 0419 07/17/17 0404 07/17/17 1756 07/18/17 0351 August 08, 2017 1814  NA 138 139  --  137 135  K 4.4 5.3* 5.4* 4.5 6.2*  CL 110 110  --  110 105  CO2 23 24  --  23  --   BUN 12 12  --  9 26*  CREATININE 0.95 0.99  --  0.90 1.60*  GLUCOSE 231* 202*  --  68 557*    Electrolytes Recent Labs  Lab 07/16/17 0419 07/17/17 0404 07/18/17 0351  CALCIUM 7.5* 7.8* 7.8*  MG 1.5* 1.7 1.6*    CBC Recent Labs  Lab 07/17/17 0404 07/18/17 0351 Aug 08, 2017 1757 08/08/2017 1814  WBC 14.5* 15.0* 17.0*  --   HGB 9.8* 8.7* 8.1* 9.5*  HCT 27.4* 23.9* 24.4* 28.0*  PLT 343 341 396  --     Coag's No results for input(s): APTT, INR in the last 168 hours.  Sepsis Markers No results for input(s): LATICACIDVEN, PROCALCITON, O2SATVEN in the last 168 hours.  ABG Recent Labs  Lab 2017/08/08  1747  PHART 7.094*  PCO2ART 16.7*  PO2ART 82.0*    Liver Enzymes Recent Labs  Lab 07/16/17 0419 07/17/17 0404 07/18/17 0351  AST 398* 290* 246*  ALT 244* 206* 177*  ALKPHOS 556* 774* 1,019*  BILITOT 1.1 1.5* 1.3*  ALBUMIN 1.8* 1.9* 1.9*    Cardiac Enzymes No results for input(s): TROPONINI, PROBNP in the last 168 hours.  Glucose Recent Labs  Lab 07/17/17 2133 07/18/17 0643 07/18/17 0706 07/18/17 1225 August 20, 2017 1723 08-20-2017 1840  GLUCAP 271* 29* 146* 167* 473* 501*    Imaging Dg Chest Port 1 View  Result Date: 08/20/17 CLINICAL DATA:  Post intubation. SOB, recent pericardial window, and pneumonia; per ED notes, pt brought in from [facility] for AMS and hyperglycemia. Per facility pt CBG trending up since this am, states pt is altered from her baseline. Per EMS, pt states her main c/o is that she "feels bad". Pt admitted last week following thoracentesis, pt has stage 4 lung cancer. EXAM: PORTABLE CHEST 1 VIEW COMPARISON:  07/16/2017. FINDINGS: Mild enlargement of the cardiopericardial silhouette. Patient is rotated to the  right. Right infrahilar mass is not L5. There are irregularly thickened interstitial markings bilaterally, more prominent on the left, increased compared to the prior study. No pneumothorax. New endotracheal tube tip projects 2.2 cm above the Carina. Nasal/orogastric tube passes below the diaphragm, well to the stomach and below the included field of view. IMPRESSION: 1. Well-positioned endotracheal tube and nasal/orogastric tube. 2. Worsened lung aeration when compared the prior exam with irregular interstitial thickening suggesting asymmetric interstitial edema superimposed on chronic lung changes. Electronically Signed   By: Lajean Manes M.D.   On: 2017-08-20 18:49     STUDIES:  CXR 5/26 > Well-positioned endotracheal tube and nasal/orogastric tube. Worsened lung aeration when compared the prior exam with irregular interstitial thickening suggesting asymmetric interstitial edema superimposed on chronic lung changes.  CULTURES: Blood 5/26 >> Tracheal Asp 5/26 >> U/A 5/26 >>  ANTIBIOTICS: None   SIGNIFICANT EVENTS: 5/26 > Presents to ED   LINES/TUBES: ETT 5/26 >>   DISCUSSION: 67 year old female with metastatic stage IV non-small cell adenocarcinoma presents to ED with AMS and Hyperglycemia. Requiring intubation.     ASSESSMENT / PLAN:  PULMONARY A: Respiratory Insuffiencey in setting of encephalopathy  Aspiration Event  Right Infrahilar mass  H/O Emphysema with bullous changes  P:   Vent Support > TV 8 cc/kg  Trend ABG/CXR VAP Bundle   CARDIOVASCULAR A:  Hypotension in setting of Cardiogenic Shock  Malignant Pericardial Effusion s/p Window on 5/17  -Previous Biopsy negative for malignancy, however cytology with Malignant cells  H/O HTN, HLD, PAD   P:  Cardiac Monitoring  CT Surgery Following  Wean Levophed and EPI to maintain MAP >65, add vasopressin Trend Troponin   Place Aline and CVC    RENAL A:   Severe Anion Gap Metabolic Acidosis  Acute Kidney Injury   (Baseline Crt 0.8-0.9)  Pseudo Hyperkalemia  P:   Trend BMP  Replace electrolytes as indicated  Bicarb push x 3 now Bicarb gtt @ 150 ml/hr  LA pending   GASTROINTESTINAL A:   SUP Iron Deficiency Anemia  Transaminitis  P:   NPO PPI Trend LFT   HEMATOLOGIC A:   Stage IV non-small cell lung adenocarcinoma with metastatic pericardial effusion, right infrahilar mass, enlarged mediastinal lymph nodes, right adrenal gland mass measuring 2.4 x 3.8   -Followed By Dr. [name] outpatient > on Ketruda/Immunotherapy every 3 weeks  Iron Deficiency  Anemia  P:  Trend CBC  Heparin SQ for VTE SCDs  INFECTIOUS A:   Aspiration Event  Hypothermia  P:   Trend WBC and Fever Curve Trend LA and PCT Follow Culture Data   ENDOCRINE A:   DKA H/O Uncontrolled DM, Hypothyroidism     P:   Trend Glucose  Insulin Gtt > DKA protocol  Beta H and Urine Studies pending   NEUROLOGIC A:   Metabolic Encephalopathy  P:   RASS goal: 0/-1 Wean Fentanyl gtt to achieve RASS   FAMILY  - Updates: Family updated.   - Inter-disciplinary family meet or Palliative Care meeting due by:      Pulmonary and [place] Pager: [phone]  August 06, 2017, 7:32 PM

## 2017-07-27 NOTE — ED Triage Notes (Signed)
Pt brought in by [facility] from [facility] for AMS and hyperglycemia. Per facility pt CBG trending up since this am, states pt is altered from her baseline. Per EMS pt states her main c/o is that she "feels bad". Pt RR30-34, CBG 348, BP 94/50. Pt admitted last week following thoracentesis, pt has stage 4 lung cancer.

## 2017-07-27 NOTE — ED Notes (Signed)
100mg  succ in

## 2017-07-27 NOTE — Consult Note (Signed)
[address]       [place] [address]             [phone]      Cardiothoracic Surgery Consultation  Reason for Consult: Recurrent metastatic pericardial effusion Referring Physician: Dr. Tanna Furry, ER attending  Brittany Mercado is an 67 y.o. female.  HPI:   The patient is a 67 year old woman with history of diabetes and hypertension as well as stage IV adenocarcinoma of the right lung who underwent a subxiphoid pericardial window by Dr. Lucianne Lei trigt on 07/12/2017.  She was discharged to a skilled nursing facility on 07/18/2017.  She was brought into the ER this evening with confusion and diabetic ketoacidosis.  She was intubated in the emergency room and reportedly aspirated.  She was hypotensive with a mean pressure in the 50s and was started on Levophed and epinephrine.  She had a glucose of 557 and a pH of 7.09 with a base deficit of -23.  Creatinine was 2.1.  Potassium was 6.6.  She had a bedside ultrasound done by the emergency room physician which showed a recurrent pericardial effusion and question of RV collapse.  A formal echocardiogram was performed which I have reviewed and shows a circumferential pericardial effusion which measures 1 to 2 cm.  I do not see any clear signs of tamponade.  Past Medical History:  Diagnosis Date  . Adenocarcinoma of right lung, stage 4 ([facility]) 11/30/2015  . Adult failure to thrive   . Complication of anesthesia    hard to wake up  . Diabetes mellitus type 2 in nonobese (HCC)   . Eczema   . Encounter for antineoplastic chemotherapy 11/30/2015  . Heart murmur    told at age 52 but never has had any problems  . History of radiation therapy 01/26/16-02/07/16   right pelvis 30 Gy in 10 fractions  . HTN (hypertension) 02/01/2016  . 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 Iron deficiency anemia due to chronic blood loss 12/10/2016    Past Surgical History:  Procedure Laterality Date  . ABDOMINAL AORTOGRAM W/LOWER EXTREMITY N/A  05/29/2017   Procedure: ABDOMINAL AORTOGRAM W/LOWER EXTREMITY;  Surgeon: Serafina Mitchell, MD;  Location: [facility];  Service: Cardiovascular;  Laterality: N/A;  . ABDOMINAL HYSTERECTOMY    . OOPHORECTOMY  1996   tumor removed from right ovary  . PERIPHERAL VASCULAR ATHERECTOMY  05/29/2017   Procedure: PERIPHERAL VASCULAR ATHERECTOMY;  Surgeon: Serafina Mitchell, MD;  Location: [facility];  Service: Cardiovascular;;  right superficiacl femoral  . SUBXYPHOID PERICARDIAL WINDOW N/A 07/12/2017   Procedure: SUBXYPHOID PERICARDIAL WINDOW;  Surgeon: Ivin Poot, MD;  Location: [facility];  Service: Thoracic;  Laterality: N/A;  . TONSILLECTOMY    . VIDEO BRONCHOSCOPY WITH ENDOBRONCHIAL NAVIGATION N/A 11/18/2015   Procedure: VIDEO BRONCHOSCOPY WITH ENDOBRONCHIAL NAVIGATION;  Surgeon: Melrose Nakayama, MD;  Location: [facility];  Service: Thoracic;  Laterality: N/A;  . VIDEO BRONCHOSCOPY WITH ENDOBRONCHIAL ULTRASOUND N/A 11/18/2015   Procedure: VIDEO BRONCHOSCOPY WITH ENDOBRONCHIAL ULTRASOUND;  Surgeon: Melrose Nakayama, MD;  Location: [facility];  Service: Thoracic;  Laterality: N/A;    Family History  Problem Relation Age of Onset  . Colon cancer Neg Hx   . Esophageal cancer Neg Hx   . Rectal cancer Neg Hx   . Stomach cancer Neg Hx     Social History:  reports that she quit smoking about 20 months ago. She has a 15.00 pack-year smoking history.  She has never used smokeless tobacco. She reports that she does not drink alcohol or use drugs.  Allergies:  Allergies  Allergen Reactions  . Penicillins Swelling    SWELLING REACTION UNSPECIFIED  PATIENT HAS HAD A PCN REACTION WITH IMMEDIATE RASH, FACIAL/TONGUE/THROAT SWELLING, SOB, OR LIGHTHEADEDNESS WITH HYPOTENSION:  #  #  YES  #  #  Has patient had a PCN reaction causing severe rash involving mucus membranes or skin necrosis: No Has patient had a PCN reaction that required hospitalization No Has patient had a PCN reaction occurring  within the last 10 years: No If all of the above answers are "NO", then may proceed with Cephalosporin use.    Medications:  I have reviewed the patient's current medications. Prior to Admission:  Medications Prior to Admission  Medication Sig Dispense Refill Last Dose  . Amino Acids-Protein Hydrolys (FEEDING SUPPLEMENT, PRO-STAT SUGAR FREE 64,) LIQD Take 30 mLs by mouth 2 (two) times daily. 900 mL 0 Taking  . aspirin EC 81 MG EC tablet Take 1 tablet (81 mg total) by mouth daily. 30 tablet 0 Taking  . blood glucose meter kit and supplies KIT Dispense based on patient and insurance preference. Use up to four times daily as directed. (FOR ICD-9 250.00, 250.01). (Patient not taking: Reported on 07/19/2017) 1 each 0 Not Taking  . cetirizine (ZYRTEC) 10 MG tablet TAKE 1 TABLET(10 MG) BY MOUTH DAILY 30 tablet 1 Taking  . clopidogrel (PLAVIX) 75 MG tablet Take 1 tablet (75 mg total) by mouth daily with breakfast. 30 tablet 0 Taking  . diclofenac sodium (VOLTAREN) 1 % GEL Apply layer to chest 4 times daily as needed for pain. 1 Tube 1 Taking  . famotidine (PEPCID AC) 10 MG chewable tablet Chew 10 mg by mouth daily as needed for heartburn.   Taking  . ferrous sulfate 325 (65 FE) MG tablet Take 325 mg by mouth daily.   Taking  . insulin aspart (NOVOLOG FLEXPEN) 100 UNIT/ML FlexPen Inject 0-15 Units into the skin 3 (three) times daily with meals. SSI:  70-120 = 0 units, 121-150 = 2 units, 151-200 = 3 units, 201-250 = 5 units, 251-300 = 8 units, 301-350 = 11 units, 351-400 = 15 units, greater than 400 call provider   Taking  . insulin glargine (LANTUS) 100 UNIT/ML injection Inject 0.06 mLs (6 Units total) into the skin at bedtime. 10 mL 11 Taking  . Insulin Pen Needle (B-D ULTRAFINE III SHORT PEN) 31G X 8 MM MISC USE AS DIRECTED WITH INSULIN (Patient not taking: Reported on 07/19/2017) 100 each 2 Not Taking  . lactulose (CHRONULAC) 10 GM/15ML solution Take 15 mLs (10 g total) by mouth daily as needed for mild  constipation. 240 mL 0 Taking  . levothyroxine (SYNTHROID, LEVOTHROID) 100 MCG tablet TAKE 1 TABLET(100 MCG) BY MOUTH DAILY BEFORE BREAKFAST 30 tablet 0 Taking  . megestrol (MEGACE ES) 625 MG/5ML suspension Take 5 mLs (625 mg total) by mouth daily. 150 mL 2 Taking  . Multiple Vitamin (MULTIVITAMIN) tablet Take 1 tablet by mouth daily.   Taking  . Nutritional Supplements (FEEDING SUPPLEMENT, GLUCERNA 1.2 CAL,) LIQD Take 237 mLs by mouth daily at 8 pm.   Taking  . oxyCODONE-acetaminophen (PERCOCET) 10-325 MG tablet Take 1 tablet by mouth every 12 (twelve) hours as needed for pain. 2 tablet 0 Taking  . polyethylene glycol (MIRALAX / GLYCOLAX) packet Take 17 g by mouth daily. 14 each 0 Taking  . senna-docusate (SENOKOT-S) 8.6-50 MG tablet Take 1  tablet by mouth at bedtime.   Taking   Scheduled: . fentaNYL      . [START ON 07/22/2017] heparin  5,000 Units Subcutaneous Q8H  . [START ON 07/22/2017] levothyroxine  50 mcg Intravenous Daily  . [START ON 07/22/2017] pantoprazole sodium  40 mg Per Tube Daily  . sodium bicarbonate      . sodium bicarbonate      . sodium bicarbonate  150 mEq Intravenous Once   Continuous: . sodium chloride    . sodium chloride    . dextrose 5 % and 0.45% NaCl Stopped (07-24-2017 1844)  . epinephrine 9.5 mcg/min (2017-07-24 1908)  . fentaNYL infusion INTRAVENOUS 50 mcg/hr (07/24/2017 2038)  . insulin (NOVOLIN-R) infusion 4.4 Units/hr (07-24-2017 1843)  . lactated ringers    . norepinephrine (LEVOPHED) Adult infusion 40 mcg/min (07/24/17 2003)  .  sodium bicarbonate (isotonic) infusion in sterile water    . vasopressin (PITRESSIN) infusion - *FOR SHOCK*     IZT:IWPYK/DXIPJASN arterial line **AND** sodium chloride, fentaNYL Anti-infectives (From admission, onward)   None      Results for orders placed or performed during the hospital encounter of 07-24-2017 (from the past 48 hour(s))  CBG monitoring, ED     Status: Abnormal   Collection Time: 2017/07/24  5:23 PM  Result Value  Ref Range   Glucose-Capillary 473 (H) 65 - 99 mg/dL  I-Stat arterial blood gas, ED     Status: Abnormal   Collection Time: Jul 24, 2017  5:47 PM  Result Value Ref Range   pH, Arterial 7.094 (LL) 7.350 - 7.450   pCO2 arterial 16.7 (LL) 32.0 - 48.0 mmHg   pO2, Arterial 82.0 (L) 83.0 - 108.0 mmHg   Bicarbonate 5.1 (L) 20.0 - 28.0 mmol/L   TCO2 6 (L) 22 - 32 mmol/L   O2 Saturation 91.0 %   Acid-base deficit 23.0 (H) 0.0 - 2.0 mmol/L   Patient temperature HIDE    Sample type ARTERIAL    Comment NOTIFIED PHYSICIAN   CBC with Differential (PNL)     Status: Abnormal   Collection Time: 24-Jul-2017  5:57 PM  Result Value Ref Range   WBC 17.0 (H) 4.0 - 10.5 K/uL   RBC 2.77 (L) 3.87 - 5.11 MIL/uL   Hemoglobin 8.1 (L) 12.0 - 15.0 g/dL   HCT 24.4 (L) 36.0 - 46.0 %   MCV 88.1 78.0 - 100.0 fL   MCH 29.2 26.0 - 34.0 pg   MCHC 33.2 30.0 - 36.0 g/dL   RDW 19.2 (H) 11.5 - 15.5 %   Platelets 396 150 - 400 K/uL   Neutrophils Relative % 87 %   Neutro Abs 14.8 (H) 1.7 - 7.7 K/uL   Lymphocytes Relative 6 %   Lymphs Abs 1.0 0.7 - 4.0 K/uL   Monocytes Relative 7 %   Monocytes Absolute 1.1 (H) 0.1 - 1.0 K/uL   Eosinophils Relative 0 %   Eosinophils Absolute 0.0 0.0 - 0.7 K/uL   Basophils Relative 0 %   Basophils Absolute 0.0 0.0 - 0.1 K/uL    Comment: Performed at [facility], [address] [place], [place] [address]  I-Stat Chem 8, ED     Status: Abnormal   Collection Time: Jul 24, 2017  6:14 PM  Result Value Ref Range   Sodium 135 135 - 145 mmol/L   Potassium 6.2 (H) 3.5 - 5.1 mmol/L   Chloride 105 101 - 111 mmol/L   BUN 26 (H) 6 - 20 mg/dL   Creatinine, Ser  1.60 (H) 0.44 - 1.00 mg/dL   Glucose, Bld 557 (HH) 65 - 99 mg/dL   Calcium, Ion 1.04 (L) 1.15 - 1.40 mmol/L   TCO2 9 (L) 22 - 32 mmol/L   Hemoglobin 9.5 (L) 12.0 - 15.0 g/dL   HCT 28.0 (L) 36.0 - 46.0 %   Comment NOTIFIED PHYSICIAN   CBG monitoring, ED     Status: Abnormal   Collection Time: July 31, 2017  6:40 PM  Result Value Ref Range    Glucose-Capillary 501 (HH) 65 - 99 mg/dL  Basic metabolic panel     Status: Abnormal   Collection Time: 31-Jul-2017  7:04 PM  Result Value Ref Range   Sodium 136 135 - 145 mmol/L   Potassium 6.6 (HH) 3.5 - 5.1 mmol/L    Comment: NO VISIBLE HEMOLYSIS CRITICAL RESULT CALLED TO, READ BACK BY AND VERIFIED WITH: RN Verne Grain AT 1948 [id number] MARTINB    Chloride 101 101 - 111 mmol/L   CO2 <7 (L) 22 - 32 mmol/L   Glucose, Bld 557 (HH) 65 - 99 mg/dL    Comment: CRITICAL RESULT CALLED TO, READ BACK BY AND VERIFIED WITH: RN J [place] [id number] MARTINB    BUN 30 (H) 6 - 20 mg/dL   Creatinine, Ser 2.11 (H) 0.44 - 1.00 mg/dL   Calcium 8.2 (L) 8.9 - 10.3 mg/dL   GFR calc non Af Amer 23 (L) >60 mL/min   GFR calc Af Amer 27 (L) >60 mL/min    Comment: (NOTE) The eGFR has been calculated using the CKD EPI equation. This calculation has not been validated in all clinical situations. eGFR's persistently <60 mL/min signify possible Chronic Kidney Disease.    Anion gap NOT CALCULATED 5 - 15    Comment: Performed at [facility] [address], [place], [place] [address]  CBG monitoring, ED     Status: Abnormal   Collection Time: 31-Jul-2017  7:48 PM  Result Value Ref Range   Glucose-Capillary 338 (H) 65 - 99 mg/dL    Dg Chest Port 1 View  Result Date: 2017-07-31 CLINICAL DATA:  Post intubation. SOB, recent pericardial window, and pneumonia; per ED notes, pt brought in from [facility] for AMS and hyperglycemia. Per facility pt CBG trending up since this am, states pt is altered from her baseline. Per EMS, pt states her main c/o is that she "feels bad". Pt admitted last week following thoracentesis, pt has stage 4 lung cancer. EXAM: PORTABLE CHEST 1 VIEW COMPARISON:  07/16/2017. FINDINGS: Mild enlargement of the cardiopericardial silhouette. Patient is rotated to the right. Right infrahilar mass is not L5. There are irregularly thickened interstitial markings bilaterally, more prominent on  the left, increased compared to the prior study. No pneumothorax. New endotracheal tube tip projects 2.2 cm above the Carina. Nasal/orogastric tube passes below the diaphragm, well to the stomach and below the included field of view. IMPRESSION: 1. Well-positioned endotracheal tube and nasal/orogastric tube. 2. Worsened lung aeration when compared the prior exam with irregular interstitial thickening suggesting asymmetric interstitial edema superimposed on chronic lung changes. Electronically Signed   By: Lajean Manes M.D.   On: 07/31/17 18:49    Review of Systems  Unable to perform ROS: Intubated   Blood pressure (!) 113/46, pulse 95, temperature (!) 96.1 F (35.6 C), temperature source Rectal, resp. rate (!) 21, height 5' 3" (1.6 m), SpO2 (!) 83 %. Physical Exam  Constitutional:  Cachectic woman who is intubated  Cardiovascular: Normal rate, regular rhythm  and normal heart sounds.  No murmur heard. Respiratory:  Decreased breath sounds over the RLL.  GI: Soft. She exhibits no distension.    Assessment/Plan:  This 67 year old woman has stage IV adenocarcinoma of the lung with a large right infrahilar mass with no obstruction of the right lower lobe bronchus and postobstructive pneumonitis in the right lower lobe.  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  She presents today with diabetic ketoacidosis with hypotension.  Echocardiogram shows recurrent pericardial effusion which is moderate in size.  I do not see any clear signs of tamponade.  Her hemodynamic instability on presentation could certainly be due to dehydration and profound acidosis.  I do not think she is in any condition to undergo an emergent pericardial window and I doubt that the effusion is the main reason for instability.  She has metastatic adenocarcinoma of the lung and is obviously in decline with malnutrition and is now intubated with likely  aspiration pneumonia.  If her dehydration and diabetic ketoacidosis are corrected and she continues to have hemodynamic instability then we might consider a repeat pericardial window although I do not think is going to improve her prognosis with metastatic lung cancer.  I discussed all this with the patient's children at the bedside and they seem to understand.  I will follow-up on her progress tomorrow.  I spent 60 minutes performing this consultation and > 50% of this time was spent face to face with the patient and her children counseling and coordinating the care of this patient's recurrent malignant pericardial effusion and shock.  Gaye Pollack 07/22/2017, 8:41 PM

## 2017-07-27 NOTE — ED Notes (Signed)
2 more liters of NS being pressure bagged into pt at this time per MD order

## 2017-07-27 NOTE — Progress Notes (Signed)
RT NOTE:  Pt transported to 3M08 without event. Report given to [facility], RRT.

## 2017-07-27 NOTE — Death Summary Note (Signed)
DEATH SUMMARY AND DISCHARGE DOCUMENTATION   Patient Details  Name: Brittany Mercado MRN: [id number] DOB: 1950-09-24  Admission/Discharge Information   Admit Date:  18-Aug-2017  Date of Death: Date of Death: 2017-08-18  Time of Death: Time of Death: 2305-06-03  Length of Stay: 0  Referring Physician: [name], DO   Reason(s) for Hospitalization  1. Cardiogenic Shock secondary to possible tamponade from recurrent malignant pericardial effusion 2.Severe Anion Gap Metabolic Acidosis secondary to elevated Lactic and Diabetic ketoacidosis 3. Acute Hypoxic Respiratory Failure - intubated on mechanical ventilation 4. Metastatic Adenocarcinoma of the lung  Diagnoses  Preliminary cause of death: Cardiogenic shock Secondary Diagnoses (including complications and co-morbidities):  Active Problems:   Malignant pericardial effusion (HCC)   Acute respiratory failure [facility])   Brief Hospital Course (including significant findings, care, treatment, and services provided and events leading to death)  Brittany Mercado is a 67 y.o. 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 She presented to ED altered hypotensive, hyperglycemic and was intubated and started on vasopressors. PCCM accepted pt to ICU for the mgmt of Cardiogenic Shock with Severe Anion Gap metabolic Acidosis secondary to lactic acidosis and Diabetic Ketoacidosis with re-accumulation of pericardial fluid on echo.  Despite full mechanical ventilation support, multiple vasoactive infusions, insulin ggt, bicarb ggt, Pt remained in severe acidosis with bradycardia and hypotension.  ECHO demonstrated reaccumulation of pericardial fluid, Co-ox 15 which supports diagnosis of Cardiogenic shock.   Pertinent Labs and Studies  Significant Diagnostic Studies Dg Chest 2 View  Result Date:  07/16/2017 CLINICAL DATA:  Pericardial drain. EXAM: CHEST - 2 VIEW COMPARISON:  07/14/2017. FINDINGS: 0710 hours. Pericardial drain has been removed in the interval. Right IJ central line has been removed. Lungs are hyperexpanded with underlying chronic interstitial coarsening. Left base atelectasis associated with small bilateral pleural effusions, left greater than right. The cardiopericardial silhouette is within normal limits for size. The visualized bony structures of the thorax are intact. Telemetry leads overlie the chest. IMPRESSION: 1. Interval removal of pericardial drain and right IJ central line. 2. Left base atelectasis with bilateral pleural effusions. 3. Hyperexpansion with underlying chronic interstitial coarsening. Electronically Signed   By: Misty Stanley M.D.   On: 07/16/2017 09:19   Dg Chest 2 View  Result Date: 07/06/2017 CLINICAL DATA:  Severe chest pain, heart racing for while but worse this morning, awoke patient from sleep, RIGHT shoulder and chest pain, stage IV lung cancer on chemotherapy, tachypnea, hypertension, type II diabetes mellitus, former smoker EXAM: CHEST - 2 VIEW COMPARISON:  05/23/2017; correlation interval CT chest 06/10/2017 FINDINGS: Borderline enlargement of cardiac silhouette. Atherosclerotic calcification aorta. Mediastinal contours and pulmonary vascularity normal. Emphysematous changes consistent with COPD. No definite acute infiltrate, pleural effusion or pneumothorax. Bones demineralized. IMPRESSION: COPD changes without acute abnormality. Electronically Signed   By: Lavonia Dana M.D.   On: 07/06/2017 13:31   Ct Angio Chest Pe W Or Wo Contrast  Result Date: 07/08/2017 CLINICAL DATA:  Chest discomfort.  Possible pulmonary embolus. EXAM: CT ANGIOGRAPHY CHEST WITH CONTRAST TECHNIQUE: Multidetector CT imaging of the chest was performed using the standard protocol during bolus administration of intravenous contrast. Multiplanar CT image reconstructions and MIPs  were obtained to evaluate the vascular anatomy. CONTRAST:  41mL ISOVUE-370 IOPAMIDOL (ISOVUE-370) INJECTION 76% COMPARISON:  06/10/2017. FINDINGS: Cardiovascular: Negative for pulmonary embolus. Atherosclerotic calcification of the arterial  vasculature, including coronary arteries. Pulmonary arteries and heart are enlarged. New moderate pericardial effusion. Mediastinum/Nodes: Low internal jugular lymph nodes on the left measure up to 8 mm. No pathologically enlarged mediastinal lymph nodes. Right infrahilar mass measures approximately 3.3 x 3.6 cm and markedly narrows the right superior pulmonary vein. There is encasement and mild narrowing of the right middle and right lower lobe pulmonary arteries. Findings are similar to 06/10/2017. No left hilar adenopathy. Subpectoral and axillary lymph nodes measure up to 7 mm on the left. Esophagus is grossly unremarkable. Lungs/Pleura: Severe centrilobular emphysema with bullous changes in the right apex. New collapse/consolidation in the right lower lobe obscures a previously measured nodule in the superior segment right lower lobe. Small right pleural effusion is new and is partially loculated in the upper right hemithorax. There is obstruction of the right lower lobe bronchus by the right infrahilar mass, which is new from 06/10/2017. Associated fluid-filled bronchi in the right lower lobe. Upper Abdomen: Visualized portion of the liver is unremarkable. Irregular right adrenal gland mass measures 2.4 x 3.8 cm, previously 2.1 x 3.4 cm. Visualized portions of the left adrenal gland and kidneys are unremarkable. Previously measured ill-defined low-attenuation lesion in the spleen is not readily visualized, possibly due to differences in bolus timing. Visualized portions of the pancreas, stomach and bowel are grossly unremarkable. High aortocaval lymph node measures 10 mm, similar. Musculoskeletal: No worrisome lytic or sclerotic lesions. Degenerative changes in the spine.  Review of the MIP images confirms the above findings. IMPRESSION: 1. Negative for pulmonary embolus. 2. Right infrahilar mass is grossly stable in size but with new obstruction of the right lower lobe bronchus and postobstructive pneumonitis/pneumonia in the right lower lobe. Previously measured nodule in the superior segment right lower lobe is now obscured. 3. Enlarging right adrenal metastasis. Previously seen splenic lesion is not well visualized today, possibly due to bolus timing. 4. New small right pleural effusion. 5. New moderate pericardial effusion. 6. Enlarged pulmonary arteries, indicative of pulmonary arterial hypertension. 7.  Aortic atherosclerosis (ICD10-170.0). 8.  Emphysema (ICD10-J43.9). Electronically Signed   By: Lorin Picket M.D.   On: 07/08/2017 12:00   Nm Pulmonary Perf And Vent  Result Date: 07/07/2017 CLINICAL DATA:  Chest pain, shortness of breath, tachycardia, abnormal renal function, history lung cancer, hypertension, type II diabetes mellitus, former smoker EXAM: NUCLEAR MEDICINE VENTILATION - PERFUSION LUNG SCAN TECHNIQUE: Ventilation images were obtained in multiple projections using inhaled aerosol Tc-65m DTPA. Perfusion images were obtained in multiple projections after intravenous injection of Tc-44m-MAA. RADIOPHARMACEUTICALS:  28 mCi of Tc-50m DTPA aerosol inhalation and 4 mCi Tc40m-MAA IV COMPARISON:  None Correlation: Chest radiograph 07/06/2017, CT chest 05/21/2017 FINDINGS: Ventilation: Central airway deposition of aerosol at RIGHT hilum. Absent ventilation to RIGHT lower lobe. Segmental/subsegmental diminished ventilation laterally in RIGHT upper lobe. No additional ventilatory defects. Perfusion: Present though diminished perfusion in the LEFT lower lobe, better than ventilation. Matching diminished perfusion laterally in RIGHT upper lobe. Nonsegmental irregular perfusion in lateral LEFT upper lobe. Chest radiograph: Upper normal heart size with question pulmonary  vascular congestion, emphysematous changes, and RIGHT hilar/perihilar mass. IMPRESSION: Matching ventilation and perfusion defect at lateral RIGHT upper lobe corresponding to site of significant emphysematous changes by CT. Absent ventilation and diminished perfusion in the RIGHT lower lobe in a patient with a known RIGHT hilar/perihilar mass. No other segmental or subsegmental perfusion defects identified. Findings represent an intermediate probability for pulmonary embolism. Electronically Signed   By: Lavonia Dana M.D.   On:  07/07/2017 12:48   Dg Chest Port 1 View  Result Date: 07-27-17 CLINICAL DATA:  Post intubation. SOB, recent pericardial window, and pneumonia; per ED notes, pt brought in from [facility] for AMS and hyperglycemia. Per facility pt CBG trending up since this am, states pt is altered from her baseline. Per EMS, pt states her main c/o is that she "feels bad". Pt admitted last week following thoracentesis, pt has stage 4 lung cancer. EXAM: PORTABLE CHEST 1 VIEW COMPARISON:  07/16/2017. FINDINGS: Mild enlargement of the cardiopericardial silhouette. Patient is rotated to the right. Right infrahilar mass is not L5. There are irregularly thickened interstitial markings bilaterally, more prominent on the left, increased compared to the prior study. No pneumothorax. New endotracheal tube tip projects 2.2 cm above the Carina. Nasal/orogastric tube passes below the diaphragm, well to the stomach and below the included field of view. IMPRESSION: 1. Well-positioned endotracheal tube and nasal/orogastric tube. 2. Worsened lung aeration when compared the prior exam with irregular interstitial thickening suggesting asymmetric interstitial edema superimposed on chronic lung changes. Electronically Signed   By: Lajean Manes M.D.   On: 27-Jul-2017 18:49   Dg Chest Port 1 View  Result Date: 07/14/2017 CLINICAL DATA:  Chest tube in place. EXAM: PORTABLE CHEST 1 VIEW COMPARISON:  07/13/2017 and CT  07/08/2017 FINDINGS: Right IJ central venous catheter unchanged with tip over the SVC. Pericardial drain unchanged. Lungs are adequately inflated with stable subtle bibasilar opacification likely small amount of pleural fluid/atelectasis. No pneumothorax. Stable right hilar prominence with known mass in this region by CT. Cardiomediastinal silhouette and remainder of the exam is unchanged. IMPRESSION: Stable minimal bibasilar opacification likely small amount of bilateral pleural fluid/atelectasis. Stable right hilar prominence with known mass in this region by recent CT. Tubes and lines as described. Electronically Signed   By: Marin Olp M.D.   On: 07/14/2017 09:12   Dg Chest Port 1 View  Result Date: 07/13/2017 CLINICAL DATA:  Pericardial effusion. EXAM: PORTABLE CHEST 1 VIEW COMPARISON:  07/12/2017 FINDINGS: Right IJ central venous catheter with tip at the cavoatrial junction unchanged. Catheter projecting from below with tip over the left heart unchanged likely pericardial catheter. Lungs are adequately inflated with minimal bibasilar hazy opacification likely small amount of pleural fluid/atelectasis. Cardiomediastinal silhouette and remainder the exam is unchanged. IMPRESSION: Stable mild hazy bibasilar opacification likely pleural fluid/atelectasis. Tubes and lines as described. Electronically Signed   By: Marin Olp M.D.   On: 07/13/2017 07:48   Dg Chest Port 1 View  Result Date: 07/12/2017 CLINICAL DATA:  Status post subxiphoid pericardial window. EXAM: PORTABLE CHEST 1 VIEW COMPARISON:  07/08/2017 CT, 07/06/2017 chest radiograph and prior studies FINDINGS: No significant change in cardiomediastinal silhouette. A RIGHT IJ central venous catheter is noted with tip overlying the SUPERIOR cavoatrial junction. A pericardial drain is now noted. Mild bibasilar atelectasis noted with possible trace effusions. There is no evidence of pneumothorax. IMPRESSION: RIGHT IJ central venous catheter and  pericardial drain. No pneumothorax. Mild bibasilar atelectasis with possible trace effusions. Electronically Signed   By: Margarette Canada M.D.   On: 07/12/2017 18:54   Dg Chest Portable 1 View  Result Date: 07/06/2017 CLINICAL DATA:  Acute shortness of breath. History of stage IV lung cancer. EXAM: PORTABLE CHEST 1 VIEW COMPARISON:  07/06/2017 chest radiograph, 06/10/2017 CT and prior studies FINDINGS: RIGHT hilar/perihilar mass again noted. UPPER limits normal heart size identified. Apparent increase in pulmonary vascular congestion noted. No pleural effusion or pneumothorax noted. No acute bony abnormalities  are present. IMPRESSION: Question pulmonary vascular congestion. Otherwise unchanged appearance of the chest with RIGHT hilar/perihilar mass. Electronically Signed   By: Margarette Canada M.D.   On: 07/06/2017 19:14   US Abdomen Limited Ruq  Result Date: 07/11/2017 CLINICAL DATA:  Abnormal liver function tests. EXAM: ULTRASOUND ABDOMEN LIMITED RIGHT UPPER QUADRANT COMPARISON:  CT scan of May 21, 2017. FINDINGS: Gallbladder: No gallstones or wall thickening visualized. No sonographic Murphy sign noted by sonographer. Common bile duct: Diameter: 1 mm which is within normal limits. Liver: No focal lesion identified. Within normal limits in parenchymal echogenicity. Portal vein is patent on color Doppler imaging with normal direction of blood flow towards the liver. 4.9 x 3.4 x 1.9 cm hypoechoic mass is noted between right kidney and liver which corresponds to adrenal mass described on prior CT scan. IMPRESSION: 4.9 x 3.4 x 1.9 cm right adrenal mass is noted most consistent with metastatic disease. No other abnormality seen in the right upper quadrant of the abdomen. Electronically Signed   By: Marijo Conception, M.D.   On: 07/11/2017 08:56    Microbiology Recent Results (from the past 240 hour(s))  Aerobic/Anaerobic Culture (surgical/deep wound)     Status: None   Collection Time: 07/12/17  4:35 PM  Result  Value Ref Range Status   Specimen Description PERICARDIAL  Final   Special Requests NONE  Final   Gram Stain   Final    RARE WBC PRESENT, PREDOMINANTLY MONONUCLEAR NO ORGANISMS SEEN    Culture   Final    No growth aerobically or anaerobically. Performed at [facility], [place] [address] [place], [place] [address]    Report Status 07/17/2017 FINAL  Final  MRSA PCR Screening     Status: None   Collection Time: 07/12/17  8:49 PM  Result Value Ref Range Status   MRSA by PCR NEGATIVE NEGATIVE Final    Comment:        The GeneXpert MRSA Assay (FDA approved for NASAL specimens only), is one component of a comprehensive MRSA colonization surveillance program. It is not intended to diagnose MRSA infection nor to guide or monitor treatment for MRSA infections. Performed at [facility], [place] [address] [place], [place] [address]     Lab Basic Metabolic Panel: Recent Labs  Lab 07/16/17 [phone] 07/17/17 0404 07/17/17 1756 07/18/17 0351 Jul 24, 2017 1814 07-24-17 1904 Jul 24, 2017 2058 24-Jul-2017 2100  NA 138 139  --  137 135 136  --  143  K 4.4 5.3* 5.4* 4.5 6.2* 6.6*  --  4.9  CL 110 110  --  110 105 101  --  110  CO2 23 24  --  23  --  <7*  --  11*  GLUCOSE 231* 202*  --  68 557* 557*  --  502*  BUN 12 12  --  9 26* 30*  --  24*  CREATININE 0.95 0.99  --  0.90 1.60* 2.11*  --  1.80*  CALCIUM 7.5* 7.8*  --  7.8*  --  8.2*  --  6.7*  MG 1.5* 1.7  --  1.6*  --   --  1.8  --   PHOS  --   --   --   --   --   --  7.5*  --    Liver Function Tests: Recent Labs  Lab 07/15/17 0459 07/16/17 0419 07/17/17 0404 07/18/17 0351 07/24/2017 2049  AST 742* 398* 290* 246* 362*  ALT 267* 244* 206*  177* 142*  ALKPHOS 485* 556* 774* 1,019* 1,122*  BILITOT 1.2 1.1 1.5* 1.3* 3.1*  PROT 4.3* 4.3* 4.7* 4.5* 4.6*  ALBUMIN 1.8* 1.8* 1.9* 1.9* 1.8*   No results for input(s): LIPASE, AMYLASE in the last 168 hours. No results for input(s): AMMONIA in the last 168 hours. CBC: Recent Labs   Lab 07/15/17 0459 07/16/17 0419 07/17/17 0404 07/18/17 0351 26-Jul-2017 1757 26-Jul-2017 1814  WBC 8.6 11.4* 14.5* 15.0* 17.0*  --   NEUTROABS  --   --   --   --  14.8*  --   HGB 8.5* 9.4* 9.8* 8.7* 8.1* 9.5*  HCT 24.3* 26.9* 27.4* 23.9* 24.4* 28.0*  MCV 80.2 80.8 79.7 79.1 88.1  --   PLT 300 340 343 341 396  --    Cardiac Enzymes: No results for input(s): CKTOTAL, CKMB, CKMBINDEX, TROPONINI in the last 168 hours. Sepsis Labs: Recent Labs  Lab 07/16/17 0419 07/17/17 0404 07/18/17 0351 07-26-2017 1757 07-26-2017 1923 2017-07-26 2058  PROCALCITON  --   --   --   --   --  2.92  WBC 11.4* 14.5* 15.0* 17.0*  --   --   LATICACIDVEN  --   --   --   --  14.8*  --     Procedures/Operations  Intubated by ED Physician CVL in Right femoral vein Arterial Line in Right femoral artery  ME was notified due to death being within 24 hrs of admission  Kandice Hams Jul 26, 2017, 11:27 PM

## 2017-07-27 NOTE — Progress Notes (Signed)
Wasted 235mL of Fentanyl in the sink. Witnessed by Rito Ehrlich RN.

## 2017-07-27 NOTE — ED Notes (Signed)
MD at bedside, RRT at bedise

## 2017-07-27 NOTE — ED Notes (Signed)
1 epi in

## 2017-07-27 NOTE — ED Notes (Signed)
10 etomidate in

## 2017-07-27 NOTE — Progress Notes (Signed)
ABG done per MD order.  Results reported to Dr.James, no new orders at this time.

## 2017-07-27 NOTE — ED Notes (Signed)
2L NS pressure bagging into pt per MD request.

## 2017-07-27 NOTE — Procedures (Signed)
Central Venous Catheter Insertion Procedure Note Zion Lint [id number] 08-May-1950  Procedure: Insertion of Central Venous Catheter Indications: Assessment of intravascular volume, Drug and/or fluid administration and Frequent blood sampling  Procedure Details Consent: Unable to obtain consent because of emergent medical necessity. Time Out: Verified patient identification, verified procedure, site/side was marked, verified correct patient position, special equipment/implants available, medications/allergies/relevent history reviewed, required imaging and test results available.  Performed  Maximum sterile technique was used including antiseptics, cap, gloves, gown, hand hygiene, mask and sheet. Skin prep: Chlorhexidine; local anesthetic administered A antimicrobial bonded/coated triple lumen catheter was placed in the right femoral vein due to no other available access using the Seldinger technique.  Evaluation Blood flow good Complications: No apparent complications Patient did tolerate procedure well.  Hayden Pedro, AGACNP-BC [facility]  Pgr: [phone]  PCCM Pgr: [phone]

## 2017-07-27 NOTE — ED Notes (Signed)
1 amp bicarb given

## 2017-07-27 NOTE — ED Notes (Signed)
Levophed now at 8mcg

## 2017-07-27 NOTE — Progress Notes (Addendum)
Chaplain presented to the Unit as a referral from the the Chaplain at the changing of the shift. There was a large, very emotional  family gathering in the Unit Conference room upon my arrival to the [facility]. After introduction to the family Chaplain provided on going ministry of presence, spiritual care support, and comfort care support up to the time of the patient's death.  Chaplain had prayer with the family, informed them of the hospital's of end of life procedures; family contact information, funeral home information if known,and/ or provided a Patient Placement information. The family was appreciation of all the support from the hospital staff. Chaplain escorted the family downstairs to exit the hospital Chaplain Yaakov Guthrie [phone]

## 2017-07-27 NOTE — ED Notes (Signed)
Strong femoral pulse at this time

## 2017-07-27 NOTE — ED Notes (Signed)
Levophed now at 77mcg per request - Dr. Jeneen Rinks at bedside

## 2017-07-27 NOTE — Progress Notes (Signed)
Notified MD Bethena Roys and NP Hayden Pedro about critical lab, lactic acid 14.8.

## 2017-07-27 NOTE — Progress Notes (Signed)
  Echocardiogram 2D Echocardiogram has been performed.  Brittany Mercado 07/25/17, 8:27 PM

## 2017-07-27 NOTE — ED Notes (Signed)
Faint femoral pulse noted

## 2017-07-27 NOTE — ED Provider Notes (Signed)
[facility] EMERGENCY DEPARTMENT Provider Note   CSN: [id number] Arrival date & time: August 18, 2017  1710     History   Chief Complaint Chief Complaint  Patient presents with  . Altered Mental Status  . Weakness    HPI Brittany Mercado is a 67 y.o. female.  If complaint is high blood glucose, and confusion.  HPI: 67 year old female.  Just discharged from this facility 2 days ago after a stay for DKA, postobstructive pneumonia, new diagnosis of pericardial effusion and underwent pericardial window.  History of stage IV non-small cell lung adenocarcinoma, poorly controlled diabetes.  Admitted 511 through 524.  DKA.  After improvement had difficulty breathing and ultimately found to have pericardial effusion underwent pericardial window.  Was discharged to care facility 2 days ago.  Apparently this morning they noted that she was "confused" her blood sugars were "high".  Apparently were getting tired during the course of the day.  Presents via EMS hypotensive, altered, and hyperglycemic.    Past Medical History:  Diagnosis Date  . Adenocarcinoma of right lung, stage 4 ([place]) 11/30/2015  . Adult failure to thrive   . Complication of anesthesia    hard to wake up  . Diabetes mellitus type 2 in nonobese (HCC)   . Eczema   . Encounter for antineoplastic chemotherapy 11/30/2015  . Heart murmur    told at age 47 but never has had any problems  . History of radiation therapy 01/26/16-02/07/16   right pelvis 30 Gy in 10 fractions  . HTN (hypertension) 02/01/2016  . 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 Iron deficiency anemia due to chronic blood loss 12/10/2016    Patient Active Problem List   Diagnosis Date Noted  . Acute respiratory failure ([place]) 2017/08/18  . Cardiogenic shock ([place])   . 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 Hyperkalemia 07/07/2017  . Anemia 07/07/2017  . Renal insufficiency 07/07/2017  . Shortness of breath   . DKA, type 2 ([place])  05/24/2017  . Toe ulcer ([place]) 05/24/2017  . GERD (gastroesophageal reflux disease) 05/23/2017  . Hypothyroidism 05/23/2017  . Pressure injury of skin 05/09/2017  . Hypokalemia 05/09/2017  . SIRS (systemic inflammatory response syndrome) ([place]) 05/09/2017  . Lactic acidosis 05/09/2017  . DKA (diabetic ketoacidoses) ([place]) 05/08/2017  . AKI (acute kidney injury) ([place]) 05/08/2017  . Adult failure to thrive 04/29/2017  . Severe protein-calorie malnutrition ([place]) 04/10/2017  . Acute encephalopathy 04/09/2017  . Community acquired pneumonia 04/09/2017  . Encephalopathy acute 04/08/2017  . Diabetes mellitus type 2 in nonobese ([place]) 04/01/2017  . Hyperglycemia 03/25/2017  . Chest wall pain 03/21/2017  . 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 Chronic fatigue 12/24/2016  . Iron deficiency anemia due to chronic blood loss 12/10/2016  . HTN (hypertension) 02/01/2016  . Encounter for antineoplastic immunotherapy 12/21/2015  . Adenocarcinoma of right lung, stage 4 ([place]) 11/30/2015  . Encounter for antineoplastic chemotherapy 11/30/2015  . Tobacco abuse 11/10/2015  . Lung mass 10/11/2015    Past Surgical History:  Procedure Laterality Date  . ABDOMINAL AORTOGRAM W/LOWER EXTREMITY N/A 05/29/2017   Procedure: ABDOMINAL AORTOGRAM W/LOWER EXTREMITY;  Surgeon: Serafina Mitchell, MD;  Location: [facility];  Service: Cardiovascular;  Laterality: N/A;  . ABDOMINAL HYSTERECTOMY    . OOPHORECTOMY  1996   tumor removed from right ovary  . PERIPHERAL VASCULAR ATHERECTOMY  05/29/2017   Procedure: PERIPHERAL VASCULAR ATHERECTOMY;  Surgeon: Serafina Mitchell, MD;  Location: [facility];  Service: Cardiovascular;;  right superficiacl  femoral  . SUBXYPHOID PERICARDIAL WINDOW N/A 07/12/2017   Procedure: SUBXYPHOID PERICARDIAL WINDOW;  Surgeon: Ivin Poot, MD;  Location: [facility];  Service: Thoracic;  Laterality: N/A;  . TONSILLECTOMY    . VIDEO BRONCHOSCOPY WITH ENDOBRONCHIAL NAVIGATION N/A 11/18/2015    Procedure: VIDEO BRONCHOSCOPY WITH ENDOBRONCHIAL NAVIGATION;  Surgeon: Melrose Nakayama, MD;  Location: [facility];  Service: Thoracic;  Laterality: N/A;  . VIDEO BRONCHOSCOPY WITH ENDOBRONCHIAL ULTRASOUND N/A 11/18/2015   Procedure: VIDEO BRONCHOSCOPY WITH ENDOBRONCHIAL ULTRASOUND;  Surgeon: Melrose Nakayama, MD;  Location: [facility];  Service: Thoracic;  Laterality: N/A;     OB History   None      Home Medications    Prior to Admission medications   Medication Sig Start Date End Date Taking? Authorizing Provider  Amino Acids-Protein Hydrolys (FEEDING SUPPLEMENT, PRO-STAT SUGAR FREE 64,) LIQD Take 30 mLs by mouth 2 (two) times daily. 07/18/17   Hosie Poisson, MD  aspirin EC 81 MG EC tablet Take 1 tablet (81 mg total) by mouth daily. 06/01/17   Mariel Aloe, MD  blood glucose meter kit and supplies KIT Dispense based on patient and insurance preference. Use up to four times daily as directed. (FOR ICD-9 250.00, 250.01). Patient not taking: Reported on 07/19/2017 04/11/17   Elodia Florence., MD  cetirizine (ZYRTEC) 10 MG tablet TAKE 1 TABLET(10 MG) BY MOUTH DAILY 06/04/17   Curt Bears, MD  clopidogrel (PLAVIX) 75 MG tablet Take 1 tablet (75 mg total) by mouth daily with breakfast. 06/01/17   Mariel Aloe, MD  diclofenac sodium (VOLTAREN) 1 % GEL Apply layer to chest 4 times daily as needed for pain. 06/12/17   [name], DO  famotidine (PEPCID AC) 10 MG chewable tablet Chew 10 mg by mouth daily as needed for heartburn.    [provider]  ferrous sulfate 325 (65 FE) MG tablet Take 325 mg by mouth daily.    [provider]  insulin aspart (NOVOLOG FLEXPEN) 100 UNIT/ML FlexPen Inject 0-15 Units into the skin 3 (three) times daily with meals. SSI:  70-120 = 0 units, 121-150 = 2 units, 151-200 = 3 units, 201-250 = 5 units, 251-300 = 8 units, 301-350 = 11 units, 351-400 = 15 units, greater than 400 call provider    [provider]  insulin glargine  (LANTUS) 100 UNIT/ML injection Inject 0.06 mLs (6 Units total) into the skin at bedtime. 07/18/17   Hosie Poisson, MD  Insulin Pen Needle (B-D ULTRAFINE III SHORT PEN) 31G X 8 MM MISC USE AS DIRECTED WITH INSULIN Patient not taking: Reported on 07/19/2017 05/16/17   [name], DO  lactulose (CHRONULAC) 10 GM/15ML solution Take 15 mLs (10 g total) by mouth daily as needed for mild constipation. 07/18/17   Hosie Poisson, MD  levothyroxine (SYNTHROID, LEVOTHROID) 100 MCG tablet TAKE 1 TABLET(100 MCG) BY MOUTH DAILY BEFORE BREAKFAST 06/06/17   Curt Bears, MD  megestrol (MEGACE ES) 625 MG/5ML suspension Take 5 mLs (625 mg total) by mouth daily. 05/16/17   [name], DO  Multiple Vitamin (MULTIVITAMIN) tablet Take 1 tablet by mouth daily.    [provider]  Nutritional Supplements (FEEDING SUPPLEMENT, GLUCERNA 1.2 CAL,) LIQD Take 237 mLs by mouth daily at 8 pm. 07/18/17   Hosie Poisson, MD  oxyCODONE-acetaminophen (PERCOCET) 10-325 MG tablet Take 1 tablet by mouth every 12 (twelve) hours as needed for pain. 07/18/17   Hosie Poisson, MD  polyethylene glycol (MIRALAX / Floria Raveling) packet  Take 17 g by mouth daily. 07/18/17   Hosie Poisson, MD  senna-docusate (SENOKOT-S) 8.6-50 MG tablet Take 1 tablet by mouth at bedtime. 07/18/17   Hosie Poisson, MD    Family History Family History  Problem Relation Age of Onset  . Colon cancer Neg Hx   . Esophageal cancer Neg Hx   . Rectal cancer Neg Hx   . Stomach cancer Neg Hx     Social History Social History   Tobacco Use  . Smoking status: Former Smoker    Packs/day: 0.50    Years: 30.00    Pack years: 15.00    Last attempt to quit: 11/01/2015    Years since quitting: 1.7  . Smokeless tobacco: Never Used  Substance Use Topics  . Alcohol use: No  . Drug use: No     Allergies   Penicillins   Review of Systems Review of Systems  Unable to perform ROS: Mental status change  Level 5 caveat applies   Physical  Exam Updated Vital Signs BP (!) 113/46   Pulse 95   Temp (!) 96.1 F (35.6 C) (Rectal)   Resp (!) 21   Ht _0  (1.6 m)   SpO2 (!) 83%   BMI 16.56 kg/m   Physical Exam  Constitutional:  Thin, frail, acutely ill appearing black female.  Tachypneic, nonverbal.  HENT:  Mucous membranes are dry.  Pupils nonreactive but irregular shape and cloudy?  Cataracts  Eyes:  Junk but not pale  Neck:  Marked JVD noted  Cardiovascular:  Hypotensive pressure 70 systolic.  Heart rate 75 sinus.  Does have heart tones present.  Ankle stitch on normal-appearing incision in her subxiphoid chest consistent with recent pericardial window  Pulmonary/Chest:  Tachypneic.  But clear lungs.  Abdominal:  Soft benign abdomen  Musculoskeletal:  Moves all 4.  Neurological:  Confused.  GCS 2 +3 +4=9  Skin:  No bruising or ecchymosis no rash.     ED Treatments / Results  Labs (all labs ordered are listed, but only abnormal results are displayed) Labs Reviewed  CBC WITH DIFFERENTIAL/PLATELET - Abnormal; Notable for the following components:      Result Value   WBC 17.0 (*)    RBC 2.77 (*)    Hemoglobin 8.1 (*)    HCT 24.4 (*)    RDW 19.2 (*)    Neutro Abs 14.8 (*)    Monocytes Absolute 1.1 (*)    All other components within normal limits  BASIC METABOLIC PANEL - Abnormal; Notable for the following components:   Potassium 6.6 (*)    CO2 <7 (*)    Glucose, Bld 557 (*)    BUN 30 (*)    Creatinine, Ser 2.11 (*)    Calcium 8.2 (*)    GFR calc non Af Amer 23 (*)    GFR calc Af Amer 27 (*)    All other components within normal limits  CBG MONITORING, ED - Abnormal; Notable for the following components:   Glucose-Capillary 473 (*)    All other components within normal limits  I-STAT ARTERIAL BLOOD GAS, ED - Abnormal; Notable for the following components:   pH, Arterial 7.094 (*)    pCO2 arterial 16.7 (*)    pO2, Arterial 82.0 (*)    Bicarbonate 5.1 (*)    TCO2 6 (*)    Acid-base deficit 23.0  (*)    All other components within normal limits  I-STAT CHEM 8, ED - Abnormal; Notable for the following  components:   Potassium 6.2 (*)    BUN 26 (*)    Creatinine, Ser 1.60 (*)    Glucose, Bld 557 (*)    Calcium, Ion 1.04 (*)    TCO2 9 (*)    Hemoglobin 9.5 (*)    HCT 28.0 (*)    All other components within normal limits  CBG MONITORING, ED - Abnormal; Notable for the following components:   Glucose-Capillary 501 (*)    All other components within normal limits  CBG MONITORING, ED - Abnormal; Notable for the following components:   Glucose-Capillary 338 (*)    All other components within normal limits  CULTURE, BLOOD (ROUTINE X 2)  CULTURE, BLOOD (ROUTINE X 2)  CULTURE, RESPIRATORY (NON-EXPECTORATED)  URINALYSIS, ROUTINE W REFLEX MICROSCOPIC  LACTIC ACID, PLASMA  LACTIC ACID, PLASMA  PROCALCITONIN  PROCALCITONIN  BETA-HYDROXYBUTYRIC ACID  BASIC METABOLIC PANEL  BASIC METABOLIC PANEL  BASIC METABOLIC PANEL  BLOOD GAS, ARTERIAL  CBC  MAGNESIUM  PHOSPHORUS  MAGNESIUM  PHOSPHORUS  HEPATIC FUNCTION PANEL    EKG None  Radiology Dg Chest Port 1 View  Result Date: 08/13/2017 CLINICAL DATA:  Post intubation. SOB, recent pericardial window, and pneumonia; per ED notes, pt brought in from [facility] for AMS and hyperglycemia. Per facility pt CBG trending up since this am, states pt is altered from her baseline. Per EMS, pt states her main c/o is that she "feels bad". Pt admitted last week following thoracentesis, pt has stage 4 lung cancer. EXAM: PORTABLE CHEST 1 VIEW COMPARISON:  07/16/2017. FINDINGS: Mild enlargement of the cardiopericardial silhouette. Patient is rotated to the right. Right infrahilar mass is not L5. There are irregularly thickened interstitial markings bilaterally, more prominent on the left, increased compared to the prior study. No pneumothorax. New endotracheal tube tip projects 2.2 cm above the [place]. Nasal/orogastric tube passes below the diaphragm, well  to the stomach and below the included field of view. IMPRESSION: 1. Well-positioned endotracheal tube and nasal/orogastric tube. 2. Worsened lung aeration when compared the prior exam with irregular interstitial thickening suggesting asymmetric interstitial edema superimposed on chronic lung changes. Electronically Signed   By: Lajean Manes M.D.   On: 2017/08/13 18:49    Procedures Procedures (including critical care time)  Medications Ordered in ED Medications  insulin regular (NOVOLIN R,HUMULIN R) 100 Units in sodium chloride 0.9 % 100 mL (1 Units/mL) infusion (4.4 Units/hr Intravenous New Bag/Given 08-13-2017 1843)  dextrose 5 %-0.45 % sodium chloride infusion ( Intravenous Hold Aug 13, 2017 1844)  norepinephrine (LEVOPHED) 49m in D5W 2513mpremix infusion (40 mcg/min Intravenous New Bag/Given 06/2017-06-18003)  EPINEPHrine (ADRENALIN) 4 mg in dextrose 5 % 250 mL (0.016 mg/mL) infusion (9.5 mcg/min Intravenous Rate/Dose Change 06/2017/06/18908)  0.9 %  sodium chloride infusion (has no administration in time range)  heparin injection 5,000 Units (has no administration in time range)  pantoprazole sodium (PROTONIX) 40 mg/20 mL oral suspension 40 mg (has no administration in time range)  levothyroxine (SYNTHROID, LEVOTHROID) injection 50 mcg (has no administration in time range)  fentaNYL 250070min NS 250m14m0mc44m) infusion-PREMIX (50 mcg/hr Intravenous New Bag/Given 07/21/16-Jun-2019)  fentaNYL (SUBLIMAZE) bolus via infusion 25 mcg (has no administration in time range)  sodium bicarbonate 150 mEq in sterile water 1,000 mL infusion (has no administration in time range)  sodium bicarbonate injection 150 mEq (has no administration in time range)  fentaNYL (SUBLIMAZE) 100 MCG/2ML injection (has no administration in time range)  sodium bicarbonate 1 mEq/mL injection (has no administration in time  range)  sodium bicarbonate 1 mEq/mL injection (has no administration in time range)  0.9 %  sodium chloride infusion  (has no administration in time range)  vasopressin (PITRESSIN) 40 Units in sodium chloride 0.9 % 250 mL (0.16 Units/mL) infusion (has no administration in time range)  lactated ringers bolus 1,000 mL (has no administration in time range)  sodium chloride 0.9 % bolus 2,000 mL (2,000 mLs Intravenous New Bag/Given 08-11-17 1803)  fentaNYL (SUBLIMAZE) injection 50 mcg (50 mcg Intravenous Given 08/11/2017 2010)     Initial Impression / Assessment and Plan / ED Course  I have reviewed the triage vital signs and the nursing notes.  Pertinent labs & imaging results that were available during my care of the patient were reviewed by me and considered in my medical decision making (see chart for details).    Tachypnea, hyperglycemic, ABG shows acute metabolic acidosis.  7.0 11/10/93/5.  This with diagnosis of presumed DKA.  She is hypotensive.  She is not febrile.  Given IV fluid boluses with pressure bag totaling 4 L.  Remains with map less than 65.  Angiocath insertion Performed by: Lolita Patella  Consent: Verbal consent obtained. Risks and benefits: risks, benefits and alternatives were discussed Time out: Immediately prior to procedure a "time out" was called to verify the correct patient, procedure, equipment, support staff and site/side marked as required.  Preparation: Patient was prepped and draped in the usual sterile fashion.  Vein Location: Left AC  +Ultrasound Guided  Gauge: 20  Normal blood return and flush without difficulty Patient tolerance: Patient tolerated the procedure well with no immediate complications.  Started on IV Levophed for pressure support.  Start on glucose stabilizer.  Bedside ultrasound shows effusion.  Question RV collapse.   Patient became bradycardic.  Became less responsive.  Had emesis.  Did not lose pulses.  Required intubation.  Given IV epi for bradycardia and hypotension.  INTUBATION Performed by: Lolita Patella  Required items: required  blood products, implants, devices, and special equipment available Patient identity confirmed: provided demographic data and hospital-assigned identification number Time out: Immediately prior to procedure a "time out" was called to verify the correct patient, procedure, equipment, support staff and site/side marked as required.  Indications: resp arrest, emesis  Intubation method: +Glidescope Laryngoscopy   Preoxygenation: +BVM  Sedatives: +10 mgEtomidate Paralytic: + 145mSuccinylcholine  Tube Size: 7.5 cuffed  Post-procedure assessment: chest rise and ETCO2 monitor Breath sounds: equal and absent over the epigastrium Tube secured with: ETT holder Chest x-ray interpreted by radiologist and me.  Chest x-ray findings: +endotracheal tube in appropriate position  Patient tolerated the procedure well with no immediate complications.  I discussed the case with Dr. BPamalee Leydenof cardio thoracic surgery.  He is here evaluating the patient.  I discussed the case with ICU as well.  Care team is here evaluating the patient.  Plan will be to the ICU under the care of the above physicians.  Met with, and spoken with her son, and nephew on several occasions here in the emergency room.  They were present for the resuscitation for the respiratory arrest.  Have kept him abreast of her grave condition.  CRITICAL CARE Performed by: MLolita Patella  Total critical care time: 89 minutes  Critical care time was exclusive of separately billable procedures and treating other patients.  Critical care was necessary to treat or prevent imminent or life-threatening deterioration.  Critical care was time spent personally by me on the following  activities: development of treatment plan with patient and/or surrogate as well as nursing, discussions with consultants, evaluation of patient's response to treatment, examination of patient, obtaining history from patient or surrogate, ordering and performing  treatments and interventions, ordering and review of laboratory studies, ordering and review of radiographic studies, pulse oximetry and re-evaluation of patient's condition.       Final Clinical Impressions(s) / ED Diagnoses   Final diagnoses:  Diabetic ketoacidosis with coma associated with type 1 diabetes mellitus [facility])    ED Discharge Orders    None       Tanna Furry, MD Jul 24, 2017 2040

## 2017-07-27 NOTE — Progress Notes (Signed)
RT NOTE:  Critical ABG results reported to CCM @ bedside.

## 2017-07-27 NOTE — Procedures (Signed)
Arterial Catheter Insertion Procedure Note Brittany Mercado [id number] 1950/06/20  Procedure: Insertion of Arterial Catheter  Indications: Blood pressure monitoring and Frequent blood sampling  Procedure Details Consent: Unable to obtain consent because of emergent medical necessity. Time Out: Verified patient identification, verified procedure, site/side was marked, verified correct patient position, special equipment/implants available, medications/allergies/relevent history reviewed, required imaging and test results available.  Performed  Maximum sterile technique was used including antiseptics, cap, gloves, gown and hand hygiene. Skin prep: Chlorhexidine; local anesthetic administered 22 gauge catheter was inserted into right femoral artery using the Seldinger technique. ULTRASOUND GUIDANCE USED: YES Evaluation Blood flow good; BP tracing good. Complications: No apparent complications.  Hayden Pedro, AGACNP-BC [facility]  Pgr: [phone]  PCCM Pgr: [phone]

## 2017-07-27 DEATH — deceased

## 2017-07-29 ENCOUNTER — Other Ambulatory Visit: Payer: Medicare Other

## 2017-07-29 ENCOUNTER — Ambulatory Visit: Payer: Medicare Other | Admitting: Internal Medicine

## 2017-07-29 ENCOUNTER — Ambulatory Visit: Payer: Medicare Other

## 2017-08-07 ENCOUNTER — Ambulatory Visit: Payer: Medicare Other | Admitting: Cardiothoracic Surgery

## 2017-08-19 ENCOUNTER — Other Ambulatory Visit: Payer: Medicare Other

## 2017-08-19 ENCOUNTER — Ambulatory Visit: Payer: Medicare Other

## 2017-08-19 ENCOUNTER — Ambulatory Visit: Payer: Medicare Other | Admitting: Internal Medicine

## 2017-09-11 ENCOUNTER — Ambulatory Visit: Payer: Medicare Other | Admitting: Family Medicine

## 2019-03-12 IMAGING — CT CT CHEST W/ CM
2 of 5 series · 12 of 36 positions shown, 15 images · IV contrast (APPLIED)
Comparison: CT 09/25/2016

CLINICAL DATA: Followup lung cancer. Chemotherapy ongoing.
Radiation therapy complete.

EXAM:
CT CHEST, ABDOMEN, AND PELVIS WITH CONTRAST
TECHNIQUE: Multidetector CT imaging of the chest, abdomen and pelvis was
performed following the standard protocol during bolus
administration of intravenous contrast.
CONTRAST:  80mL KMLYMK-C33 IOPAMIDOL (KMLYMK-C33) INJECTION 61%

[Series 2: cap with · axial · 0.61mm/px · z∈[+1175,+1645]mm · 9 of 118 slices shown, 12 images]
[im 12/118  mediastinal]
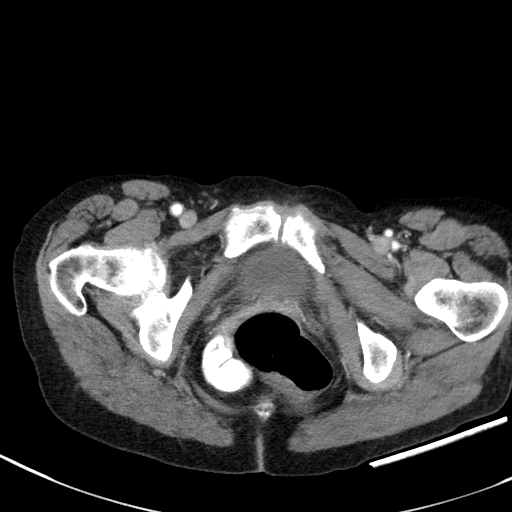
[im 12/118  lung]
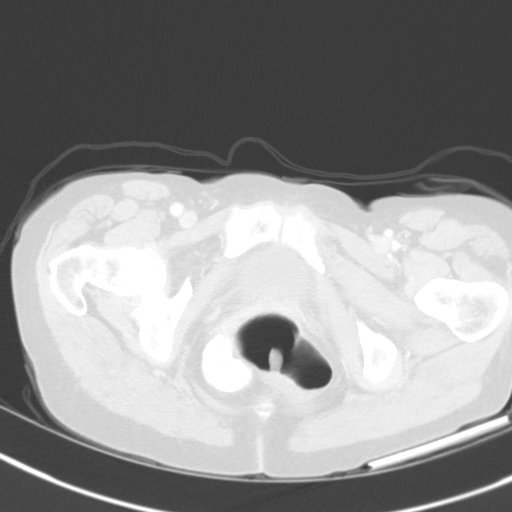
[im 24/118  lung]
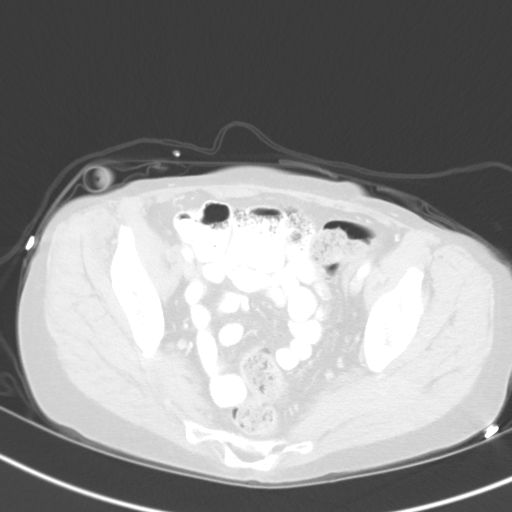
[im 36/118  lung]
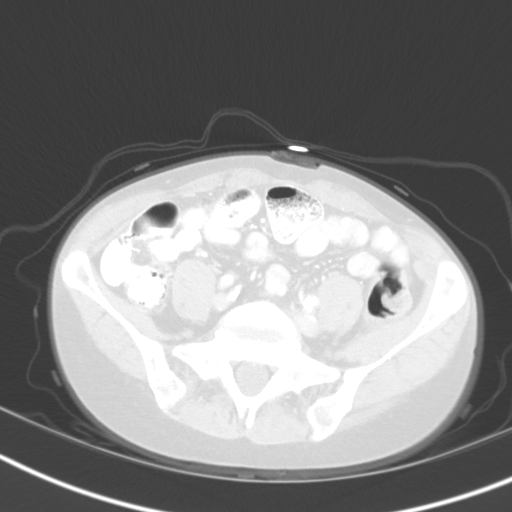
[im 47/118  lung]
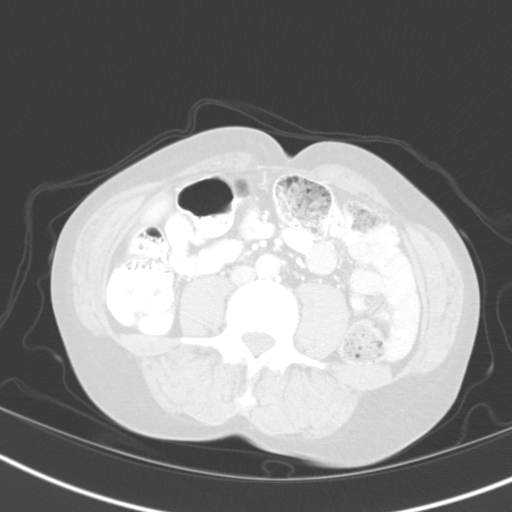
[im 59/118  mediastinal]
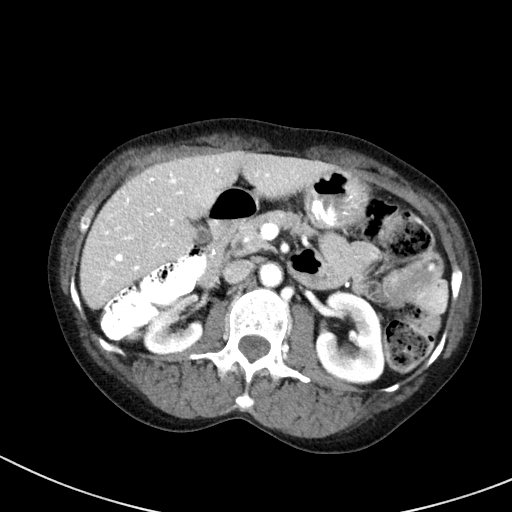
[im 59/118  lung]
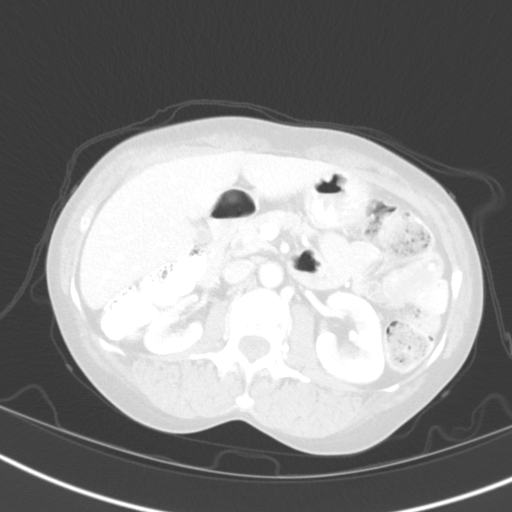
[im 71/118  lung]
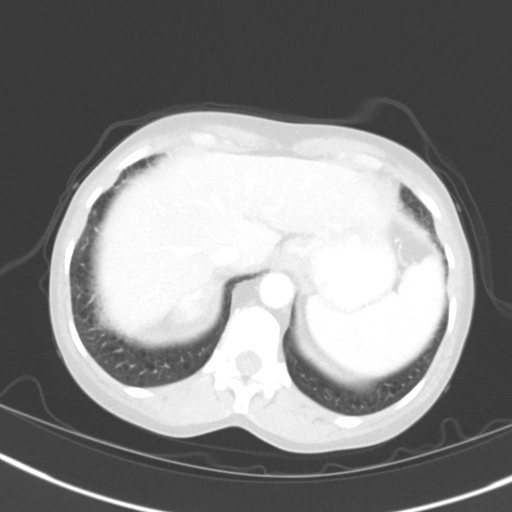
[im 82/118  lung]
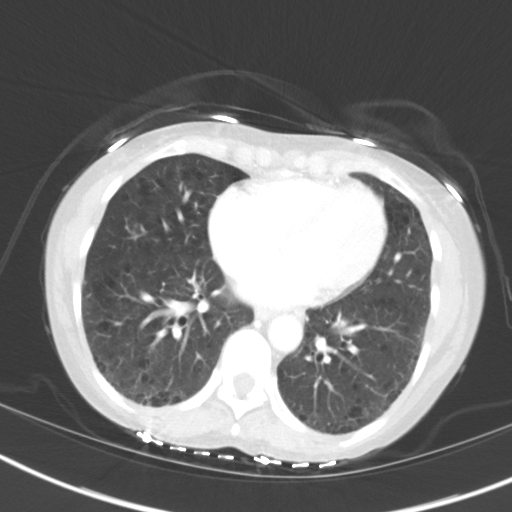
[im 94/118  lung]
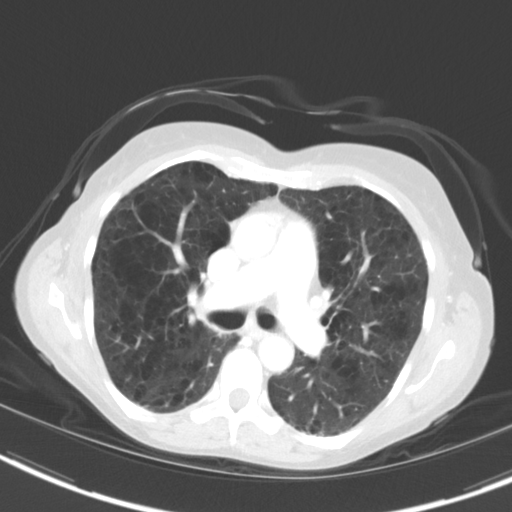
[im 106/118  mediastinal]
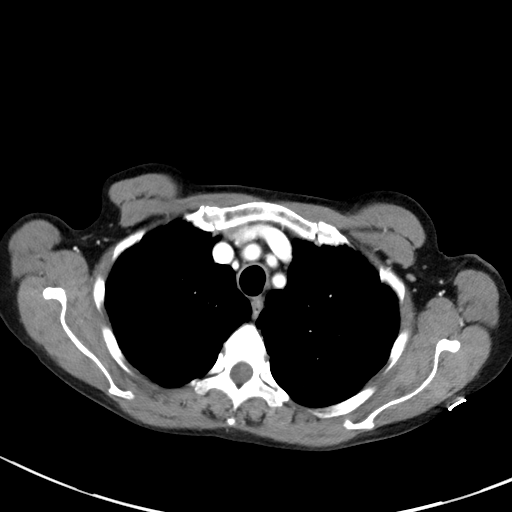
[im 106/118  lung]
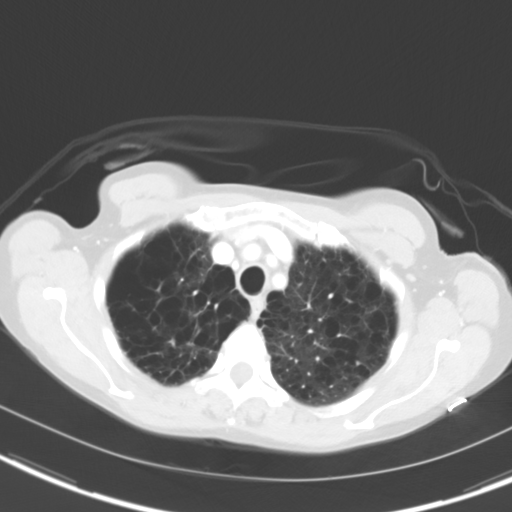

[Series 5: coronals · coronal · 0.60mm/px · 3 of 137 slices shown]
[im 28/137  lung]
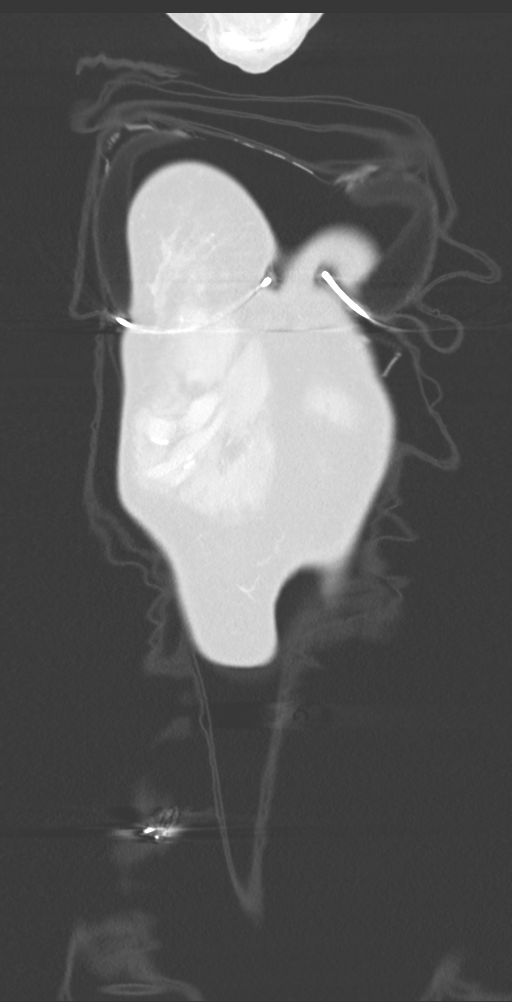
[im 55/137  lung]
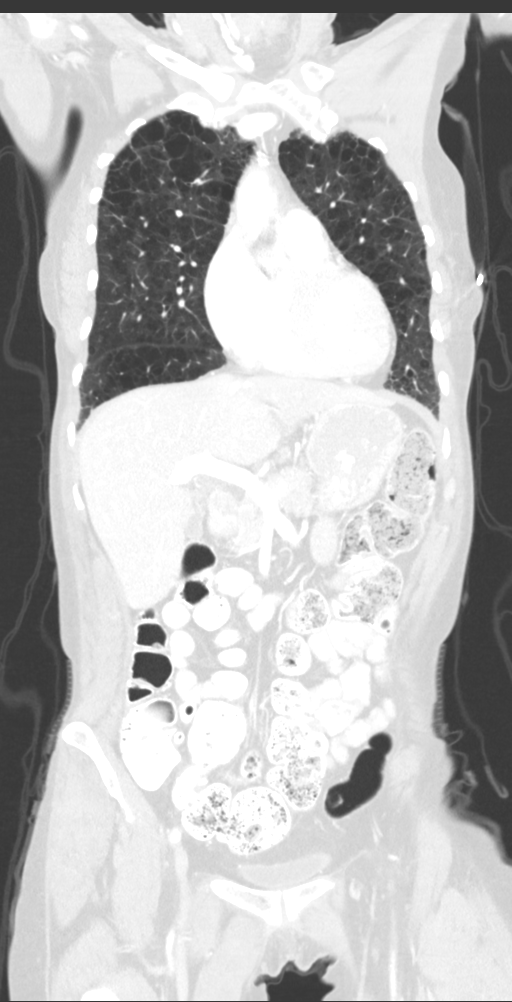
[im 82/137  lung]
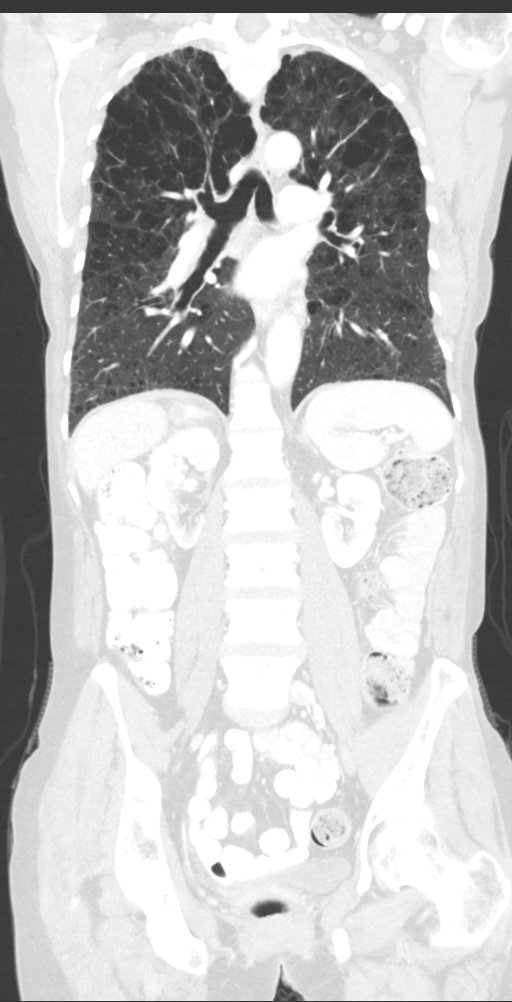

[12 of 36 positions shown; findings below may reference images not displayed]

FINDINGS: CT CHEST FINDINGS

Cardiovascular: No significant vascular findings. Normal heart size.
No pericardial effusion.

Mediastinum/Nodes: No axillary supraclavicular adenopathy. Enlarged
necrotic RIGHT hilar lymph node measures 23 by 12 mm compared with
20 by 12 mm for no significant change. No mediastinal adenopathy.

Lungs/Pleura: Nodular thickening along the posterior surface of the
RIGHT lower lobe measures 18 mm in greatest dimension (image 70 3,
series 4) which compares to 23 mm on prior.

There is extensive centrilobular emphysema in the upper lobes. No
new nodularity.

Angular nodule in the superior segment LEFT lower lobe measuring 6
mm unchanged

Musculoskeletal: No aggressive osseous lesion.

CT ABDOMEN PELVIS FINDINGS

Hepatobiliary: No focal hepatic lesion. No biliary duct dilatation.
Gallbladder is normal. Common bile duct is normal.

Pancreas: Pancreas is normal. No ductal dilatation. No pancreatic
inflammation.

Spleen: Normal spleen

Adrenals/urinary tract: Thickening of the RIGHT adrenal gland is not
changed. LEFT adrenal gland normal. Kidneys normal. Ureters bladder
normal.

Stomach/Bowel: Stomach, small bowel, appendix, and cecum are normal.
The colon and rectosigmoid colon are normal.

Vascular/Lymphatic: Abdominal aorta is normal caliber with
atherosclerotic calcification. There is no retroperitoneal or
periportal lymphadenopathy. No pelvic lymphadenopathy.

Reproductive: Post hysterectomy.

Other: No free fluid.

Musculoskeletal: No aggressive osseous lesion.
IMPRESSION: Chest Impression:

1. Stable necrotic node in the RIGHT hilum.
2. Mild contraction of pleural-based nodular consolidation in the
RIGHT lower lobe.
3. Extensive centrilobular emphysema.
4. No evidence of lung cancer progression

Abdomen / Pelvis Impression:

1. No evidence of metastatic disease in the abdomen pelvis.
2. No skeletal metastasis.
3. Aortic Atherosclerosis (8NICT-YH8.8) and Emphysema (8NICT-7T3.F).

## 2019-08-18 IMAGING — CR DG CHEST 2V
2 series · 2 of 2 positions shown · non-contrast
Comparison: 04/08/2017

CLINICAL DATA: Tachycardia.  Lung cancer.

EXAM:
CHEST - 2 VIEW

[x chest ap]
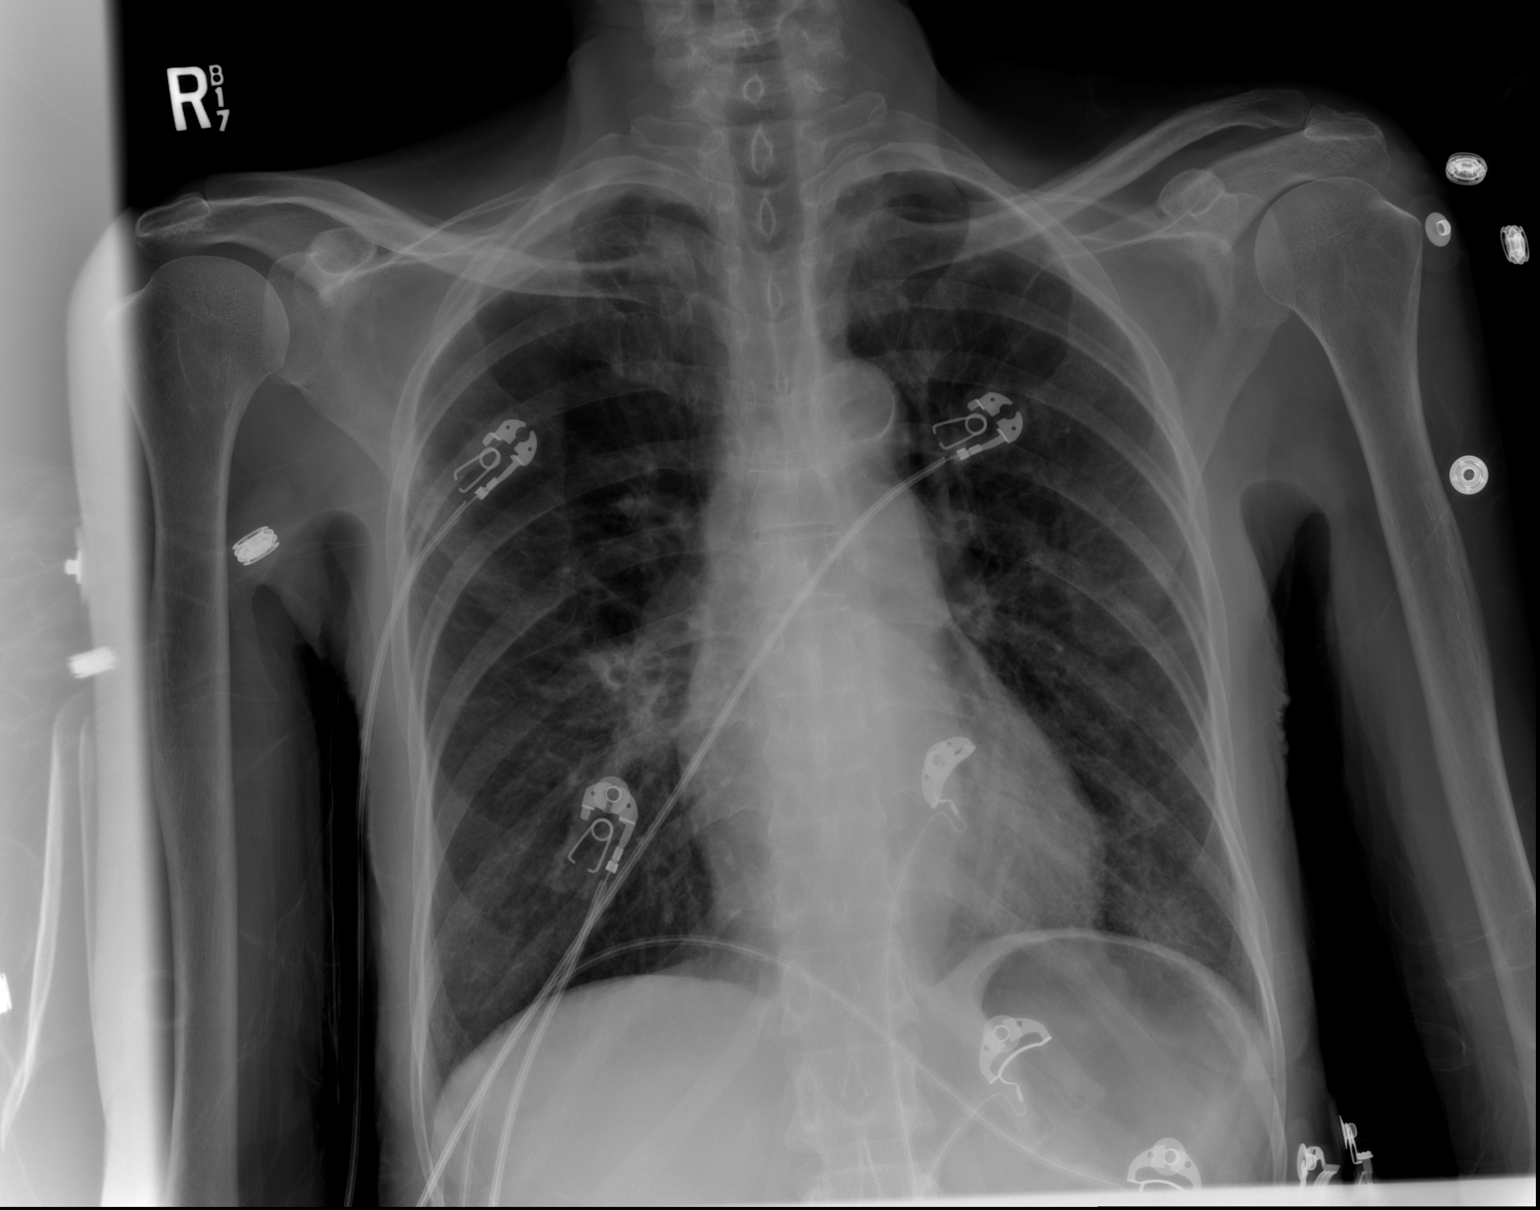

[w chest lat]
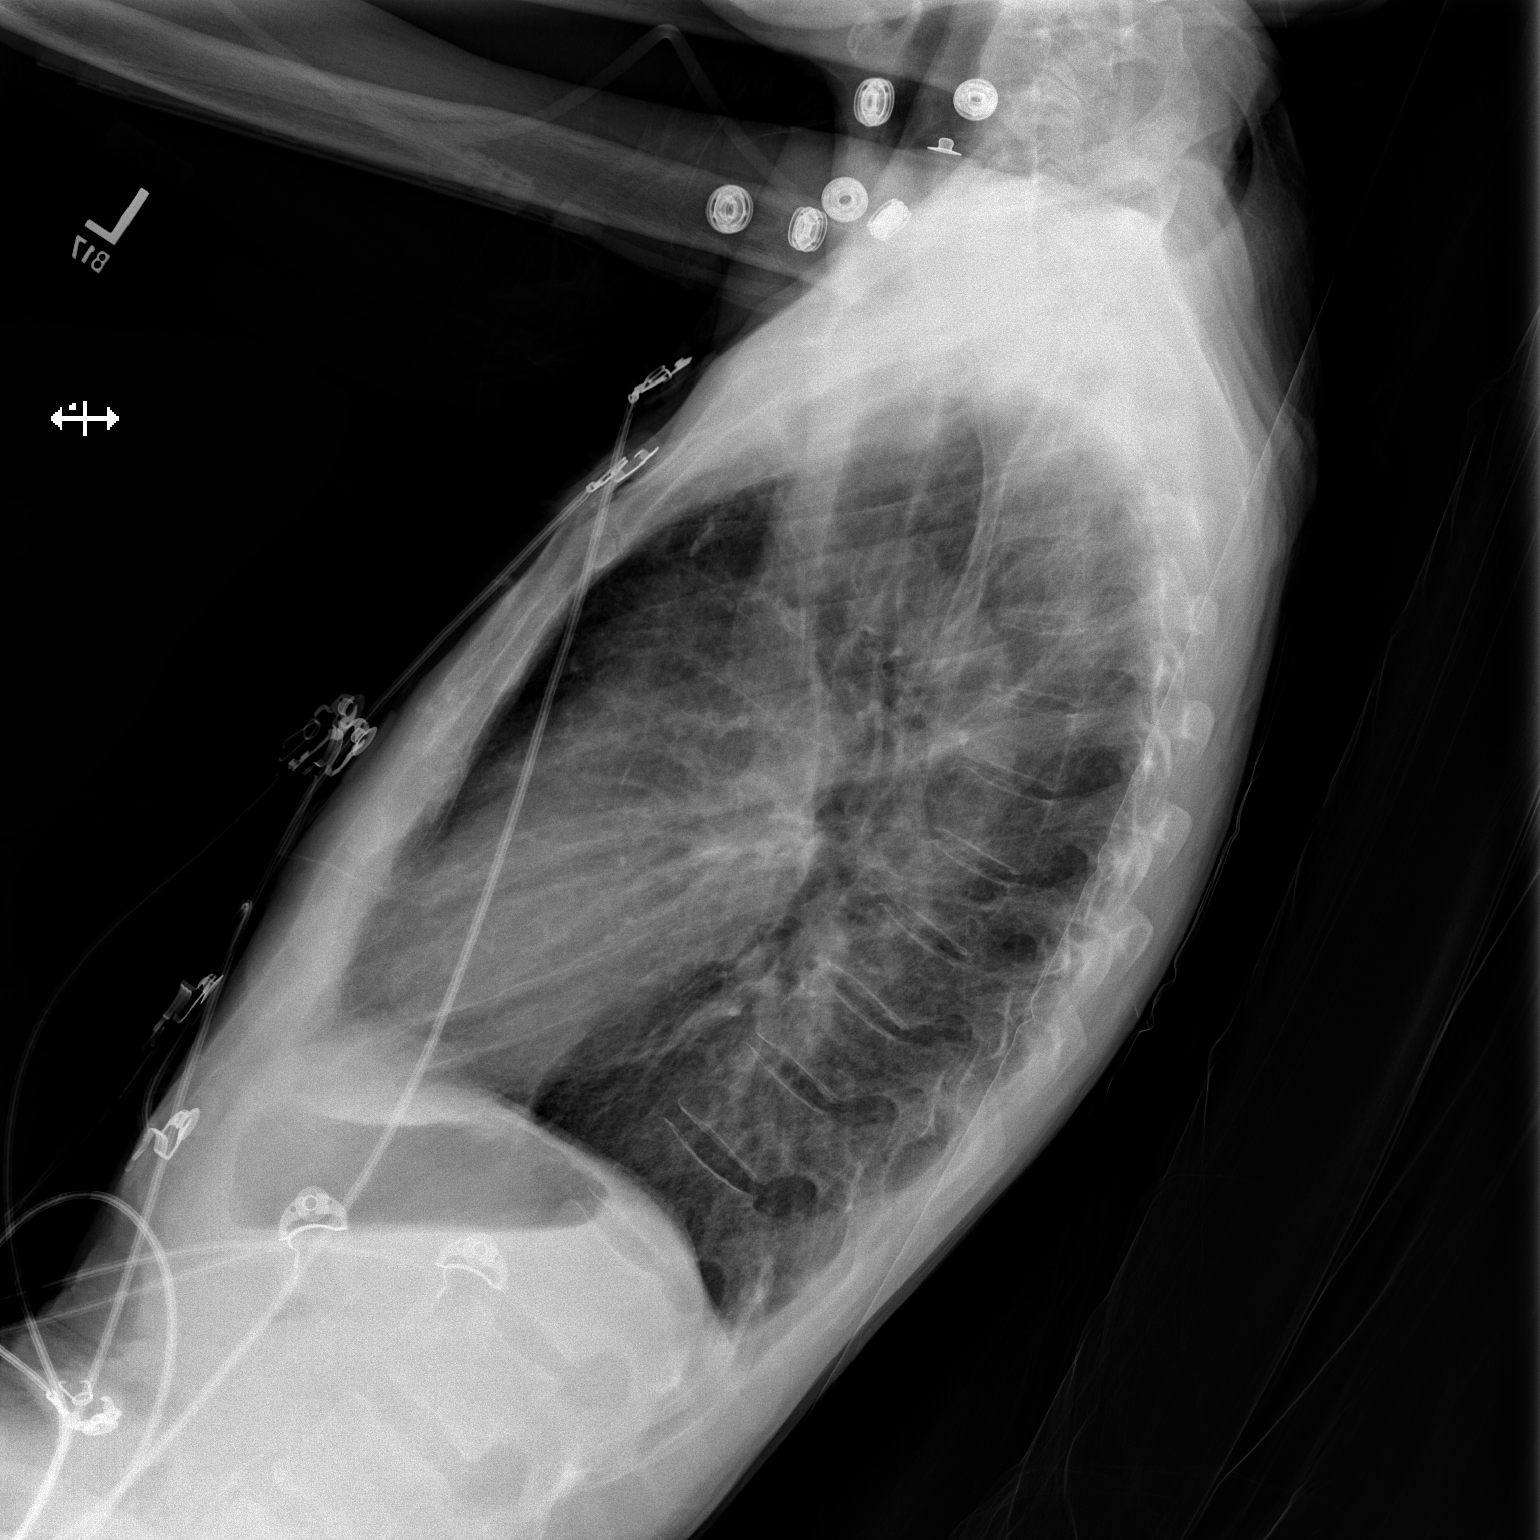

[2 of 2 positions shown; findings below may reference images not displayed]

FINDINGS: COPD with emphysema. Asymmetric density overlying the right hilum
compatible with tumor is noted on prior CT.

Negative for pneumonia or effusion. Negative for heart failure. No
acute skeletal abnormality.
IMPRESSION: COPD

Right lower lobe mass lesion and right hilar adenopathy, stable

## 2019-09-02 IMAGING — DX DG CHEST 2V
2 series · 2 of 2 positions shown · non-contrast
Comparison: Chest CT on 05/21/2017 and chest radiograph on
05/08/2017

CLINICAL DATA: Shortness of breath. Dyspnea. Chest pain. Right lung
carcinoma.

EXAM:
CHEST - 2 VIEW

[chest pa]
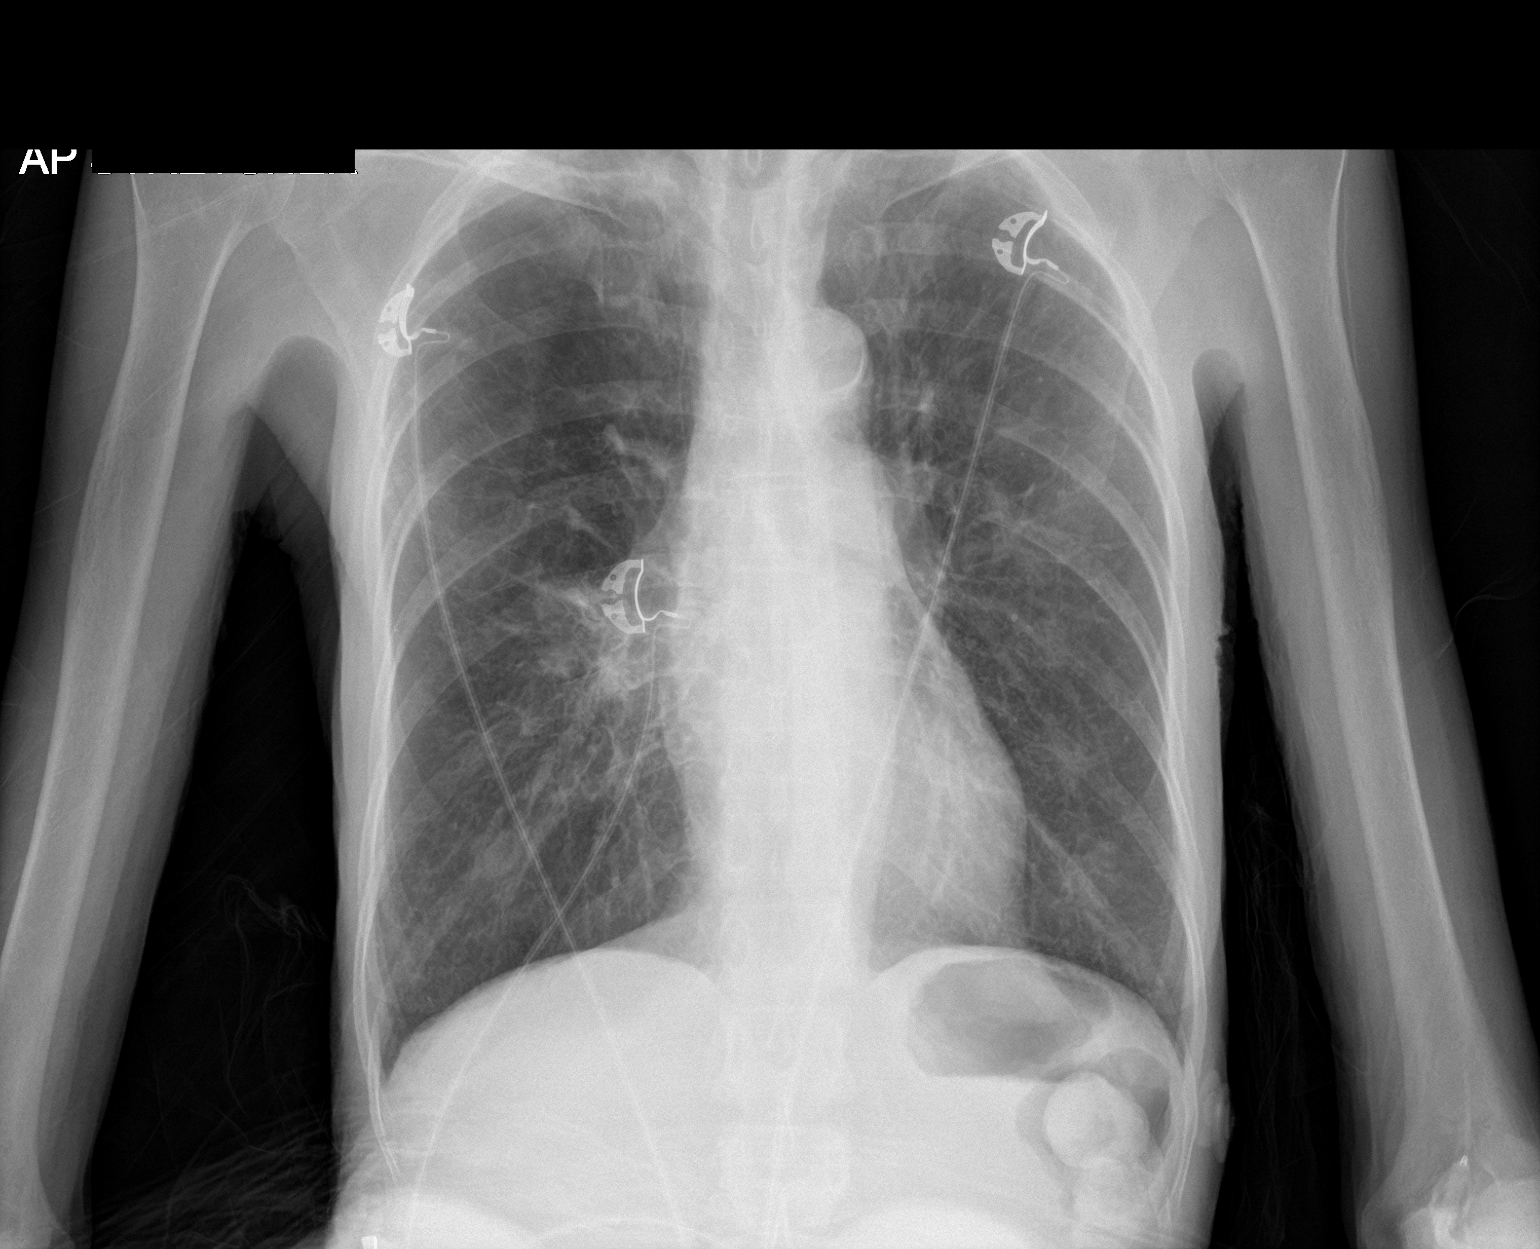

[chest lat]
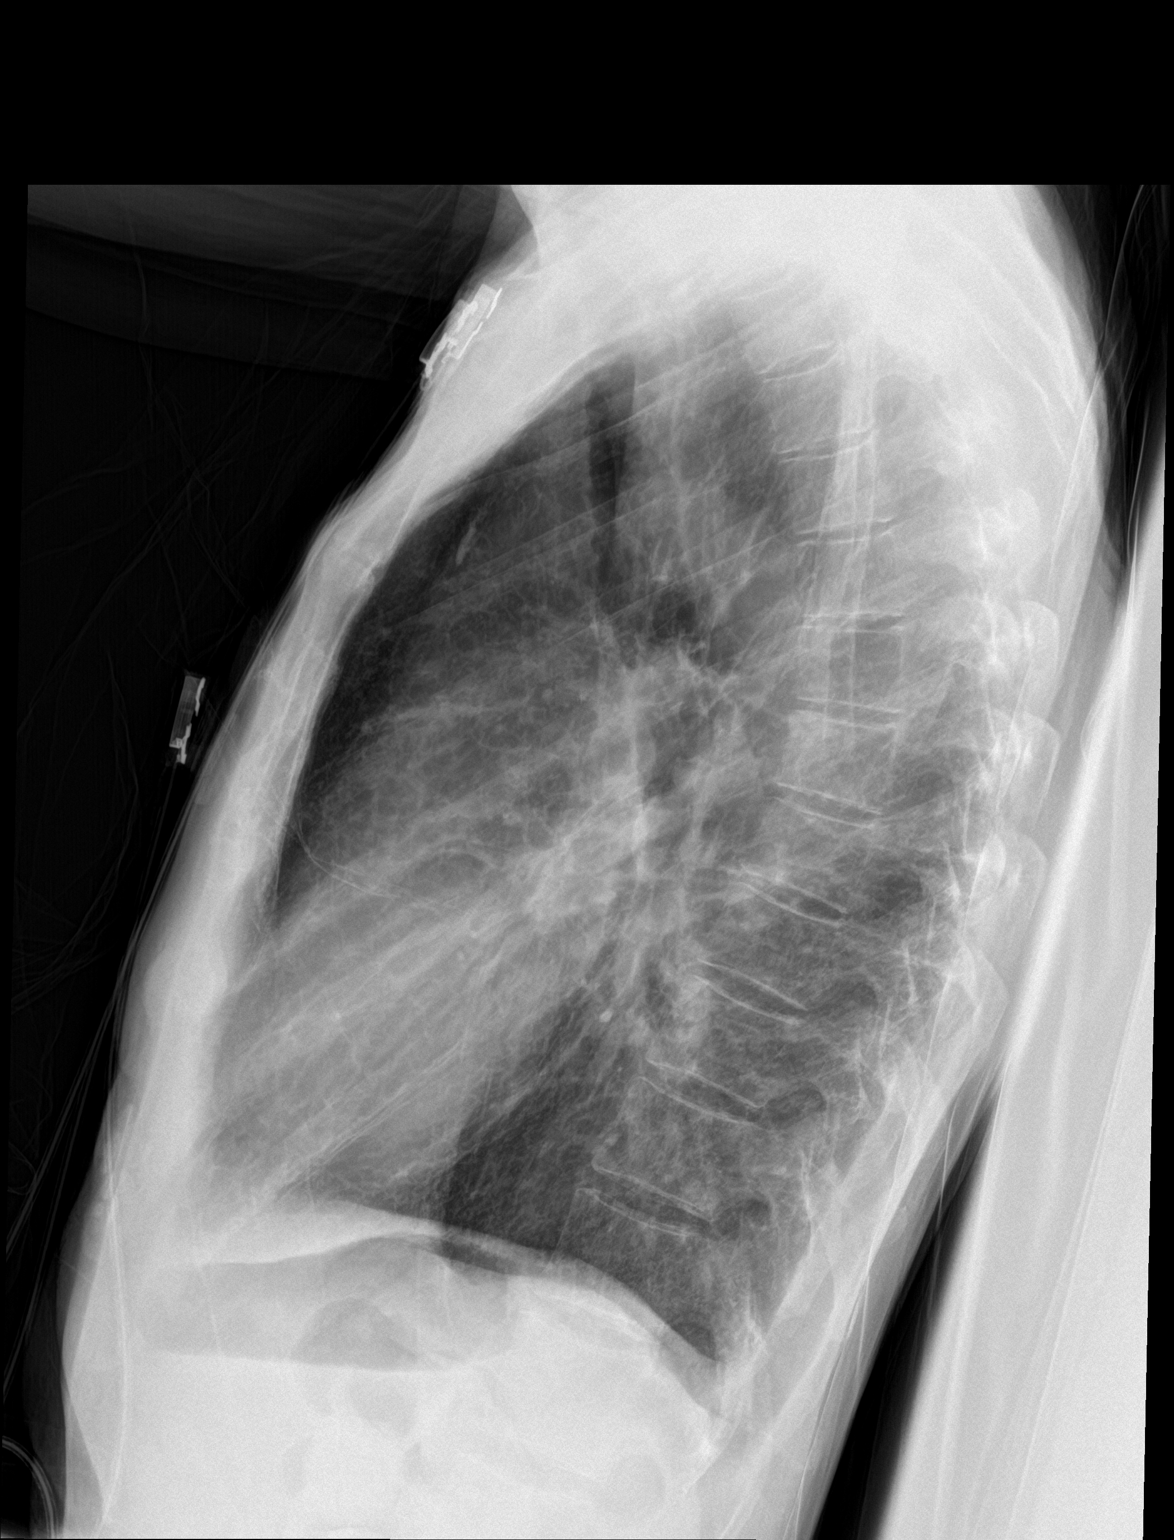

[2 of 2 positions shown; findings below may reference images not displayed]

FINDINGS: Heart size remains normal.  Aortic atherosclerosis.

Pulmonary hyperinflation is again seen, consistent with COPD.
Asymmetric soft tissue prominence in the right hilum is unchanged
consistent with lymphadenopathy. Mass seen in superior segment of
right lower lobe on recent chest CT is not well visualized on this
exam. No evidence of pleural effusion or pneumothorax.
IMPRESSION: Stable COPD and right hilar lymphadenopathy. No acute findings. See
recent chest CT report.
